# Patient Record
Sex: Female | Born: 1945
Health system: Southern US, Community
[De-identification: ages and names within clinical notes are randomized; demographics above are authoritative.]

## PROBLEM LIST (undated history)

## (undated) ENCOUNTER — Ambulatory Visit (HOSPITAL_COMMUNITY): Admission: EM | Payer: Medicare Other | Source: Home / Self Care

## (undated) DIAGNOSIS — H409 Unspecified glaucoma: Secondary | ICD-10-CM

## (undated) DIAGNOSIS — G709 Myoneural disorder, unspecified: Secondary | ICD-10-CM

## (undated) DIAGNOSIS — G4730 Sleep apnea, unspecified: Secondary | ICD-10-CM

## (undated) DIAGNOSIS — N3010 Interstitial cystitis (chronic) without hematuria: Secondary | ICD-10-CM

## (undated) DIAGNOSIS — K219 Gastro-esophageal reflux disease without esophagitis: Secondary | ICD-10-CM

## (undated) DIAGNOSIS — K635 Polyp of colon: Secondary | ICD-10-CM

## (undated) DIAGNOSIS — I10 Essential (primary) hypertension: Secondary | ICD-10-CM

## (undated) DIAGNOSIS — T7840XA Allergy, unspecified, initial encounter: Secondary | ICD-10-CM

## (undated) DIAGNOSIS — H269 Unspecified cataract: Secondary | ICD-10-CM

## (undated) DIAGNOSIS — N63 Unspecified lump in unspecified breast: Secondary | ICD-10-CM

## (undated) DIAGNOSIS — R011 Cardiac murmur, unspecified: Secondary | ICD-10-CM

## (undated) DIAGNOSIS — E785 Hyperlipidemia, unspecified: Secondary | ICD-10-CM

## (undated) DIAGNOSIS — J039 Acute tonsillitis, unspecified: Secondary | ICD-10-CM

## (undated) DIAGNOSIS — D219 Benign neoplasm of connective and other soft tissue, unspecified: Secondary | ICD-10-CM

## (undated) DIAGNOSIS — N200 Calculus of kidney: Secondary | ICD-10-CM

## (undated) HISTORY — DX: Acute tonsillitis, unspecified: J03.90

## (undated) HISTORY — DX: Essential (primary) hypertension: I10

## (undated) HISTORY — DX: Unspecified cataract: H26.9

## (undated) HISTORY — PX: ABDOMINAL HYSTERECTOMY: SHX81

## (undated) HISTORY — DX: Allergy, unspecified, initial encounter: T78.40XA

## (undated) HISTORY — PX: EYE SURGERY: SHX253

## (undated) HISTORY — DX: Polyp of colon: K63.5

## (undated) HISTORY — DX: Unspecified glaucoma: H40.9

## (undated) HISTORY — DX: Unspecified lump in unspecified breast: N63.0

## (undated) HISTORY — DX: Hyperlipidemia, unspecified: E78.5

## (undated) HISTORY — DX: Sleep apnea, unspecified: G47.30

## (undated) HISTORY — DX: Cardiac murmur, unspecified: R01.1

## (undated) HISTORY — PX: BREAST SURGERY: SHX581

## (undated) HISTORY — DX: Myoneural disorder, unspecified: G70.9

## (undated) HISTORY — PX: POLYPECTOMY: SHX149

## (undated) HISTORY — DX: Calculus of kidney: N20.0

## (undated) HISTORY — PX: BREAST EXCISIONAL BIOPSY: SUR124

## (undated) HISTORY — DX: Benign neoplasm of connective and other soft tissue, unspecified: D21.9

## (undated) HISTORY — DX: Gastro-esophageal reflux disease without esophagitis: K21.9

## (undated) HISTORY — PX: TUBAL LIGATION: SHX77

## (undated) HISTORY — DX: Interstitial cystitis (chronic) without hematuria: N30.10

---
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
## 1984-01-05 DIAGNOSIS — I10 Essential (primary) hypertension: Secondary | ICD-10-CM

## 1984-01-05 DIAGNOSIS — E785 Hyperlipidemia, unspecified: Secondary | ICD-10-CM

## 1984-01-05 HISTORY — DX: Essential (primary) hypertension: I10

## 1984-01-05 HISTORY — DX: Hyperlipidemia, unspecified: E78.5

## 1991-01-05 HISTORY — PX: MYOMECTOMY: SHX85

## 1991-01-05 HISTORY — PX: HYSTEROSCOPY: SHX211

## 1992-01-05 HISTORY — PX: LAPAROSCOPIC HYSTERECTOMY: SHX1926

## 1997-01-04 DIAGNOSIS — N3010 Interstitial cystitis (chronic) without hematuria: Secondary | ICD-10-CM

## 1997-01-04 HISTORY — DX: Interstitial cystitis (chronic) without hematuria: N30.10

## 1998-01-25 ENCOUNTER — Emergency Department (HOSPITAL_COMMUNITY): Admission: EM | Admit: 1998-01-25 | Discharge: 1998-01-25 | Payer: Self-pay | Admitting: Unknown Physician Specialty

## 2003-01-05 DIAGNOSIS — N200 Calculus of kidney: Secondary | ICD-10-CM

## 2003-01-05 HISTORY — DX: Calculus of kidney: N20.0

## 2006-01-04 HISTORY — PX: BREAST LUMPECTOMY: SHX2

## 2006-06-01 ENCOUNTER — Encounter: Admission: RE | Admit: 2006-06-01 | Discharge: 2006-06-01 | Payer: Self-pay | Admitting: Internal Medicine

## 2006-06-14 ENCOUNTER — Encounter: Admission: RE | Admit: 2006-06-14 | Discharge: 2006-06-14 | Payer: Self-pay | Admitting: Internal Medicine

## 2006-06-14 ENCOUNTER — Encounter (INDEPENDENT_AMBULATORY_CARE_PROVIDER_SITE_OTHER): Payer: Self-pay | Admitting: Diagnostic Radiology

## 2006-07-27 ENCOUNTER — Encounter (INDEPENDENT_AMBULATORY_CARE_PROVIDER_SITE_OTHER): Payer: Self-pay | Admitting: Surgery

## 2006-07-27 ENCOUNTER — Encounter: Admission: RE | Admit: 2006-07-27 | Discharge: 2006-07-27 | Payer: Self-pay | Admitting: Surgery

## 2006-07-27 ENCOUNTER — Ambulatory Visit (HOSPITAL_BASED_OUTPATIENT_CLINIC_OR_DEPARTMENT_OTHER): Admission: RE | Admit: 2006-07-27 | Discharge: 2006-07-27 | Payer: Self-pay | Admitting: Surgery

## 2006-07-27 HISTORY — PX: BREAST EXCISIONAL BIOPSY: SUR124

## 2007-06-06 ENCOUNTER — Encounter: Admission: RE | Admit: 2007-06-06 | Discharge: 2007-06-06 | Payer: Self-pay | Admitting: Internal Medicine

## 2007-11-15 ENCOUNTER — Encounter: Admission: RE | Admit: 2007-11-15 | Discharge: 2007-11-15 | Payer: Self-pay | Admitting: Otolaryngology

## 2008-01-05 HISTORY — PX: COLONOSCOPY: SHX174

## 2008-06-06 ENCOUNTER — Encounter: Admission: RE | Admit: 2008-06-06 | Discharge: 2008-06-06 | Payer: Self-pay | Admitting: Internal Medicine

## 2009-06-10 ENCOUNTER — Encounter: Admission: RE | Admit: 2009-06-10 | Discharge: 2009-06-10 | Payer: Self-pay | Admitting: Internal Medicine

## 2010-04-07 ENCOUNTER — Other Ambulatory Visit: Payer: Self-pay | Admitting: Otolaryngology

## 2010-04-13 ENCOUNTER — Ambulatory Visit
Admission: RE | Admit: 2010-04-13 | Discharge: 2010-04-13 | Disposition: A | Discharge status: Home / Self Care | Payer: Medicare Other | Source: Ambulatory Visit | Attending: Otolaryngology | Admitting: Otolaryngology

## 2010-05-12 ENCOUNTER — Other Ambulatory Visit: Payer: Self-pay | Admitting: Internal Medicine

## 2010-05-12 DIAGNOSIS — Z1231 Encounter for screening mammogram for malignant neoplasm of breast: Secondary | ICD-10-CM

## 2010-05-15 ENCOUNTER — Ambulatory Visit: Payer: Medicare Other | Admitting: Gastroenterology

## 2010-05-19 NOTE — Op Note (Signed)
NAMEKELSY, POLACK              ACCOUNT NO.:  [id number]   MEDICAL RECORD NO.:  [id number]          PATIENT TYPE:  AMB   LOCATION:  DSC                          FACILITY:  [facility]   PHYSICIAN:  Wilmon Arms. Corliss Skains, M.D. DATE OF BIRTH:  1945/02/27   DATE OF PROCEDURE:  07/27/2006  DATE OF DISCHARGE:                               OPERATIVE REPORT   PREOPERATIVE DIAGNOSIS:  Sclerosing intraductal papilloma, right breast.   POSTOPERATIVE DIAGNOSIS:  Sclerosing intraductal papilloma, right  breast.   PROCEDURE PERFORMED:  Needle localized right partial mastectomy.   SURGEON:  Wilmon Arms. Tsuei, M.D.   ANESTHESIA:  General via LMA.   INDICATIONS:  The patient is a healthy 65 year old female who underwent  a recent routine screening mammogram.  She is noted to have some loosely  clustered calcifications in the upper outer quadrant right breast.  She  underwent a stereotactic biopsy on 06/14/2006 which returned a diagnosis  of sclerosing intraductal papilloma associated with calcifications.  She  is now referred for excision of the entire lesion.  The area was  nonpalpable so a wire was placed this morning by radiology.   DESCRIPTION OF PROCEDURE:  The patient brought to the operating room,  placed in supine position on operating room table.  After an adequate  level of general anesthesia was obtained, the patient's right breast was  prepped with Betadine and draped in sterile fashion.  A curvilinear  incision was made around the needle which was placed in the right upper  outer quadrant of breast.  Skin flaps were raised in all directions with  cautery.  I followed the wire down removing the core of tissue  approximately 3 cm across.  We continued this down until we were almost  at the chest wall fascia.  We were beyond the tip of the needle.  The  specimen was removed and sent and oriented with a long suture lateral  and short suture superior.  Specimen mammogram was obtained and the  radiologist was able to see the previously placed biopsy clip as well as  the entire suspicious lesion.  The specimen was sent for pathology.  The  wound was irrigated.  Hemostasis obtained with cautery.  The wound was  closed with a deep layer of 3-0 Vicryl and subcuticular layer of 4-0  Monocryl.  Steri-Strips and clean dressings were applied.  The patient  was extubated, brought to recovery in stable condition.  All sponge,  instrument, needle counts correct.      Wilmon Arms. Tsuei, M.D.  Electronically Signed     MKT/MEDQ  D:  07/27/2006  T:  07/27/2006  Job:  [id number]   cc:   Candyce Churn. Allyne Gee, M.D.

## 2010-05-29 ENCOUNTER — Ambulatory Visit (INDEPENDENT_AMBULATORY_CARE_PROVIDER_SITE_OTHER): Payer: Medicare Other | Admitting: Gastroenterology

## 2010-05-29 ENCOUNTER — Encounter: Payer: Self-pay | Admitting: Gastroenterology

## 2010-05-29 VITALS — BP 124/74 | HR 84 | Ht 63.5 in | Wt 155.0 lb

## 2010-05-29 DIAGNOSIS — R196 Halitosis: Secondary | ICD-10-CM

## 2010-05-29 DIAGNOSIS — R933 Abnormal findings on diagnostic imaging of other parts of digestive tract: Secondary | ICD-10-CM

## 2010-05-29 DIAGNOSIS — L940 Localized scleroderma [morphea]: Secondary | ICD-10-CM

## 2010-05-29 DIAGNOSIS — R6889 Other general symptoms and signs: Secondary | ICD-10-CM

## 2010-05-29 MED ORDER — ESOMEPRAZOLE MAGNESIUM 40 MG PO CPDR: 40.0000 mg | DELAYED_RELEASE_CAPSULE | Freq: Every day | ORAL | Status: DC

## 2010-05-29 NOTE — Patient Instructions (Signed)
Nexium prescription written, take one pill once daily shortly before BF. You will be set up for an upper endoscopy. We will refer to a rheumatologist to consider further workup for Scleroderma. A copy of this information will be made available to Drs. Isabel Caprice. We will get colonoscopy records from Dr. Loreta Ave sent here for review.

## 2010-05-29 NOTE — Progress Notes (Signed)
     HPI: This is a  very pleasant 65 year old woman.  She has halitosis, chronic for many many years.  She has no pyrosis.  She has tried PPI in the past for 1-2 weeks at a time.  No dysphagia. She does cough with swallowing at times.   Overall her weight is stable in the past year.    Most recently prevacid, before breakfast.  Sees Dr. Loreta Ave, colonoscopy 2010, polyps, told to return in 5 years.  She saw ENT physician Dr.  Jearld Fenton recently:  Underwent barium esophgram last month: signficant reflux and "decreased primary peristatlic wave in esophagus" and radiologist suggested this may be related to scleroderma.    Review of systems: Pertinent positive and negative review of systems were noted in the above HPI section.  All other review of systems was otherwise negative.   Past Medical History, Past Surgical History, Family History, Social History, Current Medications, Allergies were all reviewed with the patient via [facility] electronic medical record system.   Physical Exam: BP 124/74  Pulse 84  Ht 5' 3.5" (1.613 m)  Wt 155 lb (70.308 kg)  BMI 27.03 kg/m2 Constitutional: generally well-appearing Psychiatric: alert and oriented x3 Eyes: extraocular movements intact Mouth: oral pharynx moist, no lesions Neck: supple no lymphadenopathy Cardiovascular: heart regular rate and rhythm Lungs: clear to auscultation bilaterally Abdomen: soft, nontender, nondistended, no obvious ascites, no peritoneal signs, normal bowel sounds Extremities: no lower extremity edema bilaterally Skin: no lesions on visible extremities I had her breathe on me and her breath smelled pretty normal.. she told me this is always the case with health care providers however she and her friends have noticed very bad breath.    Assessment and plan: 65 y.o. female with Chronic halitosis, abnormal barium esophagram  The barium esophagram suggested she might have scleroderma. She does have chronically itchy  skin.  Perhaps her significant reflux noted on esophagram is related to underlying scleroderma. I am going to refer her to a rheumatologist to help with that workup further. Her breath did not smell bad on exam today. She swears it does normally.  Perhaps this is indeed reflux related since significant reflux was occurring during her barium esophagram. She is going to start proton pump inhibitor once daily for the next 2-3 months. We will also proceed with an EGD to check for signs of significant acid damage.

## 2010-06-02 ENCOUNTER — Ambulatory Visit (AMBULATORY_SURGERY_CENTER): Payer: Medicare Other | Admitting: Gastroenterology

## 2010-06-02 ENCOUNTER — Encounter: Payer: Self-pay | Admitting: Gastroenterology

## 2010-06-02 VITALS — BP 161/75 | HR 56 | Temp 97.7°F | Resp 22 | Ht 63.0 in | Wt 149.0 lb

## 2010-06-02 DIAGNOSIS — R6889 Other general symptoms and signs: Secondary | ICD-10-CM

## 2010-06-02 DIAGNOSIS — K219 Gastro-esophageal reflux disease without esophagitis: Secondary | ICD-10-CM

## 2010-06-02 DIAGNOSIS — K449 Diaphragmatic hernia without obstruction or gangrene: Secondary | ICD-10-CM

## 2010-06-02 DIAGNOSIS — IMO0001 Reserved for inherently not codable concepts without codable children: Secondary | ICD-10-CM

## 2010-06-02 MED ORDER — SODIUM CHLORIDE 0.9 % IV SOLN: 500.0000 mL | INTRAVENOUS | Status: DC

## 2010-06-03 ENCOUNTER — Telehealth: Payer: Self-pay

## 2010-06-03 NOTE — Telephone Encounter (Signed)

## 2010-06-05 ENCOUNTER — Telehealth: Payer: Self-pay | Admitting: Gastroenterology

## 2010-06-05 NOTE — Telephone Encounter (Signed)
Colonoscopy 05/2008 with Dr. Loreta Ave done for "personal history of polyps, FH of colon cancer (mother & father)" Two "small cecal polyps" that were not adenomas by pathology; otherwise normal examination. She was recommended to have repeat colonoscopy at 3 year interval.  Patty, can you put her in for repeat colonoscopy 05/2013 (recommended interval based on guidelines)

## 2010-06-05 NOTE — Telephone Encounter (Signed)
Recall in Epic  

## 2010-06-15 ENCOUNTER — Ambulatory Visit: Payer: Medicare Other

## 2010-06-19 ENCOUNTER — Ambulatory Visit
Admission: RE | Admit: 2010-06-19 | Discharge: 2010-06-19 | Disposition: A | Discharge status: Home / Self Care | Payer: Medicare Other | Source: Ambulatory Visit | Attending: Internal Medicine | Admitting: Internal Medicine

## 2010-06-19 DIAGNOSIS — Z1231 Encounter for screening mammogram for malignant neoplasm of breast: Secondary | ICD-10-CM

## 2010-07-01 ENCOUNTER — Other Ambulatory Visit: Payer: Self-pay

## 2010-07-01 MED ORDER — ESOMEPRAZOLE MAGNESIUM 40 MG PO CPDR: 40.0000 mg | DELAYED_RELEASE_CAPSULE | Freq: Every day | ORAL | Status: DC

## 2010-08-31 ENCOUNTER — Telehealth: Payer: Self-pay | Admitting: Gastroenterology

## 2010-08-31 NOTE — Telephone Encounter (Signed)
Pt was put on Nexium to help with Halitosis.  She is calling to report no improvement in symptoms.  Please advise

## 2010-09-01 NOTE — Telephone Encounter (Signed)
Pt aware she has seen Dr Corliss Skains I will try to get those records.  Nothing was found on that exam.  She is calling her PCP

## 2010-09-01 NOTE — Telephone Encounter (Signed)
Ok, thanks.

## 2010-09-01 NOTE — Telephone Encounter (Signed)
Left message on machine to call back  

## 2010-09-01 NOTE — Telephone Encounter (Signed)
She should stop the nexium.  I don't think her halitosis is related to her UGI tract, acid.  Has she seen the rheumatologist that i referred her to yet?

## 2010-10-19 LAB — BASIC METABOLIC PANEL
BUN: 15
CO2: 28
Calcium: 9.2
Chloride: 106
Creatinine, Ser: 0.92
GFR calc Af Amer: >60
GFR calc non Af Amer: >60
Glucose, Bld: 110 — ABNORMAL HIGH
Potassium: 3.9
Sodium: 138

## 2010-10-19 LAB — POCT HEMOGLOBIN-HEMACUE
Hemoglobin: 13.6
Operator id: 154361

## 2011-03-02 DIAGNOSIS — H1044 Vernal conjunctivitis: Secondary | ICD-10-CM | POA: Diagnosis not present

## 2011-05-12 DIAGNOSIS — N182 Chronic kidney disease, stage 2 (mild): Secondary | ICD-10-CM | POA: Diagnosis not present

## 2011-05-12 DIAGNOSIS — E7800 Pure hypercholesterolemia, unspecified: Secondary | ICD-10-CM | POA: Diagnosis not present

## 2011-05-12 DIAGNOSIS — I129 Hypertensive chronic kidney disease with stage 1 through stage 4 chronic kidney disease, or unspecified chronic kidney disease: Secondary | ICD-10-CM | POA: Diagnosis not present

## 2011-05-12 DIAGNOSIS — Z79899 Other long term (current) drug therapy: Secondary | ICD-10-CM | POA: Diagnosis not present

## 2011-05-12 DIAGNOSIS — E559 Vitamin D deficiency, unspecified: Secondary | ICD-10-CM | POA: Diagnosis not present

## 2011-05-17 ENCOUNTER — Other Ambulatory Visit: Payer: Self-pay | Admitting: Internal Medicine

## 2011-05-17 DIAGNOSIS — Z1231 Encounter for screening mammogram for malignant neoplasm of breast: Secondary | ICD-10-CM

## 2011-06-28 ENCOUNTER — Ambulatory Visit
Admission: RE | Admit: 2011-06-28 | Discharge: 2011-06-28 | Disposition: A | Discharge status: Home / Self Care | Payer: Medicare Other | Source: Ambulatory Visit | Attending: Internal Medicine | Admitting: Internal Medicine

## 2011-06-28 DIAGNOSIS — Z1231 Encounter for screening mammogram for malignant neoplasm of breast: Secondary | ICD-10-CM | POA: Diagnosis not present

## 2011-09-07 ENCOUNTER — Observation Stay (HOSPITAL_COMMUNITY)
Admission: EM | Admit: 2011-09-07 | Discharge: 2011-09-08 | Disposition: A | Discharge status: Home / Self Care | Payer: Medicare Other | Attending: Emergency Medicine | Admitting: Emergency Medicine

## 2011-09-07 ENCOUNTER — Emergency Department (HOSPITAL_COMMUNITY): Payer: Medicare Other

## 2011-09-07 ENCOUNTER — Encounter (HOSPITAL_COMMUNITY): Payer: Self-pay | Admitting: Radiology

## 2011-09-07 ENCOUNTER — Emergency Department (INDEPENDENT_AMBULATORY_CARE_PROVIDER_SITE_OTHER)
Admission: EM | Admit: 2011-09-07 | Discharge: 2011-09-07 | Disposition: A | Discharge status: Home / Self Care | Payer: Medicare Other | Source: Home / Self Care | Attending: Emergency Medicine | Admitting: Emergency Medicine

## 2011-09-07 ENCOUNTER — Encounter (HOSPITAL_COMMUNITY): Payer: Self-pay | Admitting: Emergency Medicine

## 2011-09-07 DIAGNOSIS — M810 Age-related osteoporosis without current pathological fracture: Secondary | ICD-10-CM | POA: Diagnosis not present

## 2011-09-07 DIAGNOSIS — R079 Chest pain, unspecified: Secondary | ICD-10-CM | POA: Diagnosis not present

## 2011-09-07 DIAGNOSIS — K219 Gastro-esophageal reflux disease without esophagitis: Secondary | ICD-10-CM | POA: Insufficient documentation

## 2011-09-07 DIAGNOSIS — I10 Essential (primary) hypertension: Secondary | ICD-10-CM | POA: Insufficient documentation

## 2011-09-07 LAB — CBC WITH DIFFERENTIAL/PLATELET
Basophils Absolute: 0 10*3/uL (ref 0.0–0.1)
Basophils Relative: 0 % (ref 0–1)
Eosinophils Absolute: 0.1 10*3/uL (ref 0.0–0.7)
Eosinophils Relative: 1 % (ref 0–5)
HCT: 40.4 % (ref 36.0–46.0)
Hemoglobin: 13.7 g/dL (ref 12.0–15.0)
Lymphocytes Relative: 26 % (ref 12–46)
Lymphs Abs: 1.6 10*3/uL (ref 0.7–4.0)
MCH: 31.4 pg (ref 26.0–34.0)
MCHC: 33.9 g/dL (ref 30.0–36.0)
MCV: 92.4 fL (ref 78.0–100.0)
Monocytes Absolute: 0.4 10*3/uL (ref 0.1–1.0)
Monocytes Relative: 6 % (ref 3–12)
Neutro Abs: 4.2 10*3/uL (ref 1.7–7.7)
Neutrophils Relative %: 67 % (ref 43–77)
Platelets: 266 10*3/uL (ref 150–400)
RBC: 4.37 MIL/uL (ref 3.87–5.11)
RDW: 13.7 % (ref 11.5–15.5)
WBC: 6.2 10*3/uL (ref 4.0–10.5)

## 2011-09-07 LAB — COMPREHENSIVE METABOLIC PANEL
ALT: 17 U/L (ref 0–35)
AST: 24 U/L (ref 0–37)
Albumin: 3.8 g/dL (ref 3.5–5.2)
Alkaline Phosphatase: 70 U/L (ref 39–117)
BUN: 15 mg/dL (ref 6–23)
CO2: 25 mEq/L (ref 19–32)
Calcium: 9.8 mg/dL (ref 8.4–10.5)
Chloride: 108 mEq/L (ref 96–112)
Creatinine, Ser: 0.67 mg/dL (ref 0.50–1.10)
GFR calc Af Amer: >90 mL/min (ref >90)
GFR calc non Af Amer: 89 mL/min — ABNORMAL LOW (ref >90)
Glucose, Bld: 101 mg/dL — ABNORMAL HIGH (ref 70–99)
Potassium: 3.8 mEq/L (ref 3.5–5.1)
Sodium: 143 mEq/L (ref 135–145)
Total Bilirubin: 0.8 mg/dL (ref 0.3–1.2)
Total Protein: 7.2 g/dL (ref 6.0–8.3)

## 2011-09-07 LAB — POCT I-STAT TROPONIN I
Troponin i, poc: 0 ng/mL (ref 0.00–0.08)
Troponin i, poc: 0 ng/mL (ref 0.00–0.08)
Troponin i, poc: 0 ng/mL (ref 0.00–0.08)

## 2011-09-07 LAB — TROPONIN I: Troponin I: <0.3 ng/mL (ref <0.30)

## 2011-09-07 MED ORDER — IBUPROFEN 800 MG PO TABS
800.0000 mg | ORAL_TABLET | Freq: Once | ORAL | Status: AC
  Administered 2011-09-07: 800 mg via ORAL
  Filled 2011-09-07: qty 1

## 2011-09-07 MED ORDER — SODIUM CHLORIDE 0.9 % IJ SOLN
INTRAMUSCULAR | Status: AC
  Filled 2011-09-07: qty 3

## 2011-09-07 MED ORDER — PANTOPRAZOLE SODIUM 40 MG PO TBEC
40.0000 mg | DELAYED_RELEASE_TABLET | Freq: Every day | ORAL | Status: DC
  Administered 2011-09-07: 40 mg via ORAL
  Filled 2011-09-07: qty 1

## 2011-09-07 NOTE — ED Provider Notes (Signed)
Medical screening examination/treatment/procedure(s) were conducted as a shared visit with non-physician practitioner(s) and myself.  I personally evaluated the patient during the encounter  Atypical left-sided chest pain patient having for the past 2 hours. Movie treadmill yesterday but did not have pain until today. No nausea, vomiting, chills, shortness of breath. No cardiac history. History hypertension hyperlipidemia. Nonsmoker TIMI 1. Low suspicion for ACS. EKG shows new T-wave inversion lead 3, stable T wave inversions inferiorly  Glynn Octave, MD 09/07/11 1722

## 2011-09-07 NOTE — ED Notes (Signed)
Report given to Lana, RN.

## 2011-09-07 NOTE — Progress Notes (Signed)
Observation review is complete. 

## 2011-09-07 NOTE — ED Notes (Signed)
[facility] ac site infiltrated. New IV placed by [facility]

## 2011-09-07 NOTE — ED Provider Notes (Signed)
History     CSN: [id number]  Arrival date & time 09/07/11  1133   First MD Initiated Contact with Patient 09/07/11 1147      Chief Complaint  Patient presents with  . Chest Pain    (Consider location/radiation/quality/duration/timing/severity/associated sxs/prior treatment) HPI Comments: 66 y/o AAF with L sided nonradiating CP described as "pressure" and "tightness" starting at 1100 today. No N/V/ diaphoresis, abd pain, palpitations, presyncope, syncope. No apparent exertional or positional component. Is not related to eating. Denies waterbrash. Was having sharp L midaxillary and anterior CP yesterday that lasted 30 min yesterday after trying to move a treadmill, but this has since resolved. Patient has a history significant for hypertension, hyperlipidemia, severe reflux. Discontinued Nexium several days ago.   ROS as noted in HPI. All other ROS negative.   Patient is a 66 y.o. female presenting with chest pain. The history is provided by the patient. No language interpreter was used.  Chest Pain The chest pain began 1 - 2 hours ago. Chest pain occurs constantly. The chest pain is improving. The quality of the pain is described as pressure-like and squeezing. The pain does not radiate. Pertinent negatives for primary symptoms include no fever, no fatigue, no syncope, no shortness of breath, no cough, no wheezing, no palpitations, no abdominal pain, no nausea, no vomiting and no dizziness.  Pertinent negatives for associated symptoms include no near-syncope. She tried nothing for the symptoms.  Her past medical history is significant for hyperlipidemia and hypertension.  Pertinent negatives for past medical history include no diabetes, no DVT and no MI.  Her family medical history is significant for early MI in family. Family history comments: mother with MI at age 13     Past Medical History  Diagnosis Date  . Colon polyps   . Hyperlipidemia 1986  . Hypertension 1986  .  Interstitial cystitis 1999  . Kidney stones 2005  . GERD (gastroesophageal reflux disease)   . Osteoporosis     Past Surgical History  Procedure Date  . Hysteroscopy   . Tonsillectomy   . Colonoscopy   . Polypectomy   . Laparoscopic hysterectomy     Family History  Problem Relation Age of Onset  . Colon cancer Father     mother, PGM  . Stroke Father   . Hypertension Father   . Hypertension Mother   . Heart disease Mother     History  Substance Use Topics  . Smoking status: Never Smoker   . Smokeless tobacco: Never Used  . Alcohol Use: 3.5 oz/week    7 drink(s) per week    OB History    Grav Para Term Preterm Abortions TAB SAB Ect Mult Living                  Review of Systems  Constitutional: Negative for fever and fatigue.  Respiratory: Negative for cough, shortness of breath and wheezing.   Cardiovascular: Positive for chest pain. Negative for palpitations, syncope and near-syncope.  Gastrointestinal: Negative for nausea, vomiting and abdominal pain.  Neurological: Negative for dizziness.    Allergies  Hctz  Home Medications   Current Outpatient Rx  Name Route Sig Dispense Refill  . ATORVASTATIN CALCIUM 10 MG PO TABS Oral Take 10 mg by mouth daily.      . ASPIRIN 81 MG PO TABS Oral Take 81 mg by mouth daily.      Marland Kitchen CETIRIZINE HCL 10 MG PO CAPS Oral Take 1 capsule by mouth daily.      Marland Kitchen  COLESEVELAM HCL 3.75 G PO PACK Oral Take 3.75 g by mouth daily.      Marland Kitchen ESOMEPRAZOLE MAGNESIUM 40 MG PO CPDR Oral Take 1 capsule (40 mg total) by mouth daily. 90 capsule 4  . FINASTERIDE 5 MG PO TABS Oral Take 2.5 mg by mouth daily.      Marland Kitchen LISINOPRIL 5 MG PO TABS Oral Take 5 mg by mouth daily.      Marland Kitchen RISEDRONATE SODIUM 150 MG PO TABS Oral Take 150 mg by mouth every 30 (thirty) days. with water on empty stomach, nothing by mouth or lie down for next 30 minutes.       There were no vitals taken for this visit.  Physical Exam  Nursing note and vitals  reviewed. Constitutional: She is oriented to person, place, and time. She appears well-developed and well-nourished.  HENT:  Head: Normocephalic and atraumatic.  Eyes: Conjunctivae and EOM are normal.  Neck: Normal range of motion.  Cardiovascular: Normal rate, regular rhythm, normal heart sounds and intact distal pulses.   No murmur heard. Pulmonary/Chest: Effort normal and breath sounds normal.  Abdominal: Soft. Bowel sounds are normal. She exhibits no distension. There is no tenderness. There is no rebound and no guarding.  Musculoskeletal: Normal range of motion. She exhibits no edema and no tenderness.  Neurological: She is alert and oriented to person, place, and time.  Skin: Skin is warm and dry.  Psychiatric: She has a normal mood and affect. Her behavior is normal. Judgment and thought content normal.    ED Course  Procedures (including critical care time)  Labs Reviewed - No data to display No results found.   1. Chest pain     MDM  Previous records reviewed. H/o abnormal barium swallow showing significant reflux -EGD showed small hiatal hernia, otherwise normal. Started on Nexium in June for halitosis, which was discontinued on 8/28.  Pt may have scleroderma, saw Dr. Loni Beckwith for this. W/u neg.   EKG: NST rate 64 nml interval, normal axis no hypertrophy ,TWI in III, nonspecific T was changes anteriorlaterally which is new from prev ekg dated 8/26. No other STTW.   Current sx seem to be different than yesterday's pain. Pt reports continued sx although states are currently less. Has isolated TWI in III which is new from previous EKG dated 8/26. No STEMI. concern for atypical CP. Transferring to ED for cardiac w/u    Luiz Blare, MD 09/07/11 1248

## 2011-09-07 NOTE — ED Provider Notes (Signed)
Patient in CDU under chest pain protocol.  Patient reports initially having left posterior chest wall pain last evening, but awoke this morning with substernal chest pressure.  Patient resting comfortably at present.  Lungs CTA bilaterally.  S1/S2, RRR.  Abdomen soft, bowel sounds present.  Strong distal pulses palpated all extremities.  Monitor reveals NSR without ectopy.  12 lead reviewed, no ischemic changes noted.  Cardiac markers wnl.  Patient schedule for CCTA in AM.  Treatment and diagnostic plan discussed with patient.  Jimmye Norman, NP 09/07/11 [phone]

## 2011-09-07 NOTE — ED Notes (Addendum)
Pt presents with chest pain X 2 hours. Pt describes it as a pressure that radiates under her left breast. Pt states that she had pain yesterday that started in her back and radiated around to front after moving a treadmill yesterday. Pt states that today's pain is different than yesterday. Pt was given nitro X1 and reports some relief from pain. Pt took 2 baby ASA this morning

## 2011-09-07 NOTE — ED Notes (Signed)
Report to [facility].  

## 2011-09-07 NOTE — ED Notes (Signed)
Pt started with chest pain yesterday after moving treadmill. Pain is located center chest and radiates to back. Denies SOB, N/V or diaphoresis.

## 2011-09-07 NOTE — ED Notes (Signed)
Report from Lori, RN

## 2011-09-07 NOTE — ED Provider Notes (Signed)
History     CSN: [id number]  Arrival date & time 09/07/11  1318   First MD Initiated Contact with Patient 09/07/11 1339      Chief Complaint  Patient presents with  . Chest Pain    (Consider location/radiation/quality/duration/timing/severity/associated sxs/prior treatment) HPI Comments: Marissa Orozco 66 y.o. female   The chief complaint is: Patient presents with:   Chest Pain   The patient has medical history significant for:   Past Medical History:   Colon polyps                                                 Hyperlipidemia                                  1986         Hypertension                                    1986         Interstitial cystitis                           1999         Kidney stones                                   2005         GERD (gastroesophageal reflux disease)                       Osteoporosis                                                Patient presents with left sided CP that began this morning. Patient rates the pain 8/10 at its worse and 5/10 during HPI. Patient states that she had no associated symptoms, radiation, or transmission. Patient believes this is a pulled muscle from moving a treadmill the day before.Patient states that she has never had a stress test. Family history significant for mother who had a "mild" MI at age 49. Denies fever, chills, diaphoresis. Denies NVD, or abdominal pain. Denies SOB, palpitations, edema, or DOE. Denies recent long travel, smoking, or use of hormone replacement therapy.      The history is provided by the patient.    Past Medical History  Diagnosis Date  . Colon polyps   . Hyperlipidemia 1986  . Hypertension 1986  . Interstitial cystitis 1999  . Kidney stones 2005  . GERD (gastroesophageal reflux disease)   . Osteoporosis     Past Surgical History  Procedure Date  . Hysteroscopy   . Tonsillectomy   . Colonoscopy   . Polypectomy   . Laparoscopic hysterectomy     Family History    Problem Relation Age of Onset  . Colon cancer Father     mother, PGM  . Stroke Father   . Hypertension Father   . Hypertension Mother   . Heart disease  Mother     History  Substance Use Topics  . Smoking status: Never Smoker   . Smokeless tobacco: Never Used  . Alcohol Use: 3.5 oz/week    7 drink(s) per week    OB History    Grav Para Term Preterm Abortions TAB SAB Ect Mult Living                  Review of Systems  Constitutional: Negative for fever, chills and diaphoresis.  Respiratory: Negative for shortness of breath.   Cardiovascular: Positive for chest pain. Negative for palpitations and leg swelling.  Gastrointestinal: Negative for nausea, vomiting, abdominal pain and diarrhea.  All other systems reviewed and are negative.    Allergies  Hctz  Home Medications   Current Outpatient Rx  Name Route Sig Dispense Refill  . CALCIUM PO Oral Take 1 tablet by mouth daily.    Marland Kitchen VITAMIN D PO Oral Take 1 tablet by mouth daily.    Marland Kitchen FINASTERIDE 5 MG PO TABS Oral Take 2.5 mg by mouth daily.      . OMEGA-3 FATTY ACIDS 1000 MG PO CAPS Oral Take 2 g by mouth daily.    Marland Kitchen LISINOPRIL 5 MG PO TABS Oral Take 5 mg by mouth daily.      Marland Kitchen MAGNESIUM PO Oral Take 1 tablet by mouth daily.    . ADULT MULTIVITAMIN W/MINERALS CH Oral Take 1 tablet by mouth daily.    Marland Kitchen RISEDRONATE SODIUM 150 MG PO TABS Oral Take 150 mg by mouth every 30 (thirty) days. with water on empty stomach, nothing by mouth or lie down for next 30 minutes.     Marland Kitchen ESOMEPRAZOLE MAGNESIUM 40 MG PO CPDR Oral Take 1 capsule (40 mg total) by mouth daily. 90 capsule 4    BP 149/74  Pulse 64  Temp 98.3 F (36.8 C) (Oral)  Resp 17  SpO2 100%  Physical Exam  Nursing note and vitals reviewed. Constitutional: She appears well-developed and well-nourished. No distress.  HENT:  Head: Normocephalic and atraumatic.  Mouth/Throat: Oropharynx is clear and moist.  Eyes: Conjunctivae and EOM are normal. No scleral icterus.   Neck: Normal range of motion. Neck supple.  Cardiovascular: Normal rate, regular rhythm, normal heart sounds and intact distal pulses.        No carotid bruits on exam. No pedal edema appreciated.  CP is not reproducible on exam  Pulmonary/Chest: Effort normal and breath sounds normal.  Abdominal: Soft. Bowel sounds are normal. There is no tenderness.  Neurological: She is alert.  Skin: Skin is warm and dry.    ED Course  Procedures (including critical care time) Results for orders placed during the hospital encounter of 09/07/11  CBC WITH DIFFERENTIAL      Component Value Range   WBC 6.2  4.0 - 10.5 K/uL   RBC 4.37  3.87 - 5.11 MIL/uL   Hemoglobin 13.7  12.0 - 15.0 g/dL   HCT 40.9  81.1 - 91.4 %   MCV 92.4  78.0 - 100.0 fL   MCH 31.4  26.0 - 34.0 pg   MCHC 33.9  30.0 - 36.0 g/dL   RDW 78.2  95.6 - 21.3 %   Platelets 266  150 - 400 K/uL   Neutrophils Relative 67  43 - 77 %   Neutro Abs 4.2  1.7 - 7.7 K/uL   Lymphocytes Relative 26  12 - 46 %   Lymphs Abs 1.6  0.7 - 4.0 K/uL  Monocytes Relative 6  3 - 12 %   Monocytes Absolute 0.4  0.1 - 1.0 K/uL   Eosinophils Relative 1  0 - 5 %   Eosinophils Absolute 0.1  0.0 - 0.7 K/uL   Basophils Relative 0  0 - 1 %   Basophils Absolute 0.0  0.0 - 0.1 K/uL  COMPREHENSIVE METABOLIC PANEL      Component Value Range   Sodium 143  135 - 145 mEq/L   Potassium 3.8  3.5 - 5.1 mEq/L   Chloride 108  96 - 112 mEq/L   CO2 25  19 - 32 mEq/L   Glucose, Bld 101 (*) 70 - 99 mg/dL   BUN 15  6 - 23 mg/dL   Creatinine, Ser 1.61  0.50 - 1.10 mg/dL   Calcium 9.8  8.4 - 09.6 mg/dL   Total Protein 7.2  6.0 - 8.3 g/dL   Albumin 3.8  3.5 - 5.2 g/dL   AST 24  0 - 37 U/L   ALT 17  0 - 35 U/L   Alkaline Phosphatase 70  39 - 117 U/L   Total Bilirubin 0.8  0.3 - 1.2 mg/dL   GFR calc non Af Amer 89 (*) >90 mL/min   GFR calc Af Amer >90  >90 mL/min  TROPONIN I      Component Value Range   Troponin I <0.30  <0.30 ng/mL    Labs Reviewed - No data to  display Dg Chest 2 View  09/07/2011  *RADIOLOGY REPORT*  Clinical Data: Chest pain.  CHEST - 2 VIEW  Comparison: No priors.  Findings: Lung volumes are normal.  No consolidative airspace disease.  No pleural effusions.  No pneumothorax.  No pulmonary nodule or mass noted.  Pulmonary vasculature and the cardiomediastinal silhouette are within normal limits. Atherosclerotic calcifications within the thoracic aorta.  IMPRESSION: 1. No radiographic evidence of acute cardiopulmonary disease. 2.  Atherosclerosis.   Original Report Authenticated By: Florencia Reasons, M.D.     Date: 09/07/2011  Rate: 66  Rhythm: normal sinus rhythm  QRS Axis: normal  Intervals: normal  ST/T Wave abnormalities: non-specific t wave abnormalities  Conduction Disutrbances:none  Narrative Interpretation: No STEMI  Old EKG Reviewed: changes noted New T wave inversion in T3    1. Chest pain       MDM  Patient presented with CP that began this am. CP improved with nitro given on EMS with improvement. CMP, troponin, CBC: unremarkable. CXR: remarkable for atheroaclerotic disease. Otherwise unremarkable. Timi score: 1 Patient will be moved to CDU on CP protocol. No red flags for PE, PNA, or pneumothorax.        Pixie Casino, PA-C 09/07/11 1536  Pixie Casino, PA-C 09/07/11 1544

## 2011-09-08 ENCOUNTER — Observation Stay (HOSPITAL_COMMUNITY): Payer: Medicare Other

## 2011-09-08 DIAGNOSIS — I2510 Atherosclerotic heart disease of native coronary artery without angina pectoris: Secondary | ICD-10-CM | POA: Diagnosis not present

## 2011-09-08 DIAGNOSIS — R079 Chest pain, unspecified: Secondary | ICD-10-CM | POA: Diagnosis not present

## 2011-09-08 MED ORDER — MORPHINE SULFATE 4 MG/ML IJ SOLN
2.0000 mg | Freq: Once | INTRAMUSCULAR | Status: AC
  Administered 2011-09-08: 2 mg via INTRAVENOUS
  Filled 2011-09-08: qty 1

## 2011-09-08 MED ORDER — MORPHINE SULFATE 2 MG/ML IJ SOLN
INTRAMUSCULAR | Status: AC
  Administered 2011-09-08: 2 mg via INTRAVENOUS
  Filled 2011-09-08: qty 1

## 2011-09-08 MED ORDER — NITROGLYCERIN 0.4 MG SL SUBL
SUBLINGUAL_TABLET | SUBLINGUAL | Status: AC
  Administered 2011-09-08: 09:00:00
  Filled 2011-09-08: qty 25

## 2011-09-08 MED ORDER — METOPROLOL TARTRATE 25 MG PO TABS
100.0000 mg | ORAL_TABLET | Freq: Once | ORAL | Status: AC
  Administered 2011-09-08: 50 mg via ORAL
  Filled 2011-09-08: qty 4

## 2011-09-08 MED ORDER — IOHEXOL 350 MG/ML SOLN
80.0000 mL | Freq: Once | INTRAVENOUS | Status: AC | PRN
  Administered 2011-09-08: 80 mL via INTRAVENOUS

## 2011-09-08 MED ORDER — METOPROLOL TARTRATE 1 MG/ML IV SOLN
INTRAVENOUS | Status: AC
  Filled 2011-09-08: qty 15

## 2011-09-08 MED ORDER — CYCLOBENZAPRINE HCL 10 MG PO TABS: 10.0000 mg | ORAL_TABLET | Freq: Two times a day (BID) | ORAL | Status: AC | PRN

## 2011-09-08 NOTE — ED Notes (Signed)
Patient transported to CT 

## 2011-09-08 NOTE — ED Notes (Signed)
Patient complaining of chest pain. States pain began approximately 30 minutes. Describes pain as sharp with radiation to back. Located left center chest. 12 lead EKG done. EDP notified. Will continue to monitor patient.

## 2011-09-08 NOTE — ED Notes (Signed)
PT ambulatory at discharge, pt states spouse is picking her up. Pt denies any chest pain.

## 2011-09-08 NOTE — Progress Notes (Signed)
Patient reports some CP over the night, treated with Morphine. Patient denies CP or SOB today. Patient afebrile. Normal S1, S2, without gallop, rub, or murmur. Lungs CTA. No lower extremity edema. Patient CTA remarkable for 25-50% of LAD, also remarkable for solitary pulmonary nodule 8 mm in size that will need monitoring in 6 months. Patient informed of these findings and need for cardiology referral and repeat imaging of her chest in the future. Per radiologist none of the finding explain her CP. Patient pain is most like musculoskeletal. Patient discharged on muscle relaxer with return precautions given.  INDICATION: 66 year old female with history of chest pain.  CT ANGIOGRAPHY OF THE HEART, CORONARY ARTERY, STRUCTURE, AND  MORPHOLOGY  CONTRAST: 80mL OMNIPAQUE IOHEXOL 350 MG/ML SOLN  COMPARISON: None  TECHNIQUE: CT angiography of the coronary vessels was performed on  a 256 channel system using prospective ECG gating. A scout and  noncontrast exam (for calcium scoring) were performed. Circulation  time was measured using a test bolus. Coronary CTA was performed  with sub mm slice collimation during portions of the cardiac cycle  after prior injection of iodinated contrast. Imaging post  processing was performed on an independent workstation creating  multiplanar and 3-D images, and quantitative analysis of the heart  and coronary arteries. Note that this exam targets the heart and  the chest was not imaged in its entirety.  PREMEDICATION:  Lopressor 50 mg, P.O.  Nitroglycerin 400 mcg, sublingual.  FINDINGS:  Technical quality: Excellent  Heart rate: 56  CORONARY ARTERIES:  Left main coronary artery: Eccentric calcified plaque distally  with no stenosis.  Left anterior descending: Moderately diseased with mixed calcified  and noncalcified atherosclerotic plaque, most significantly  proximally where there is 25-50% stenosis.  Left circumflex: Small amount of mixed plaque proximally  resulting  in only 0-25% stenosis.  Right coronary artery: Negative.  Posterior descending artery: Negative.  Dominance: Codominant.  CORONARY CALCIUM:  Total Agatston Score: 173  MESA database percentile: 88th  AORTA AND PULMONARY MEASUREMENTS:  Aortic root (21 - 40 mm):  23 mm at the annulus  34 mm at the sinuses of Valsalva  27 mm at the sinotubular junction  Ascending aorta ( < 40 mm): 32 mm  Descending aorta ( < 40 mm): 22 mm  Main pulmonary artery: ( < 30 mm): 26 mm  EXTRACARDIAC FINDINGS:  Multiple pulmonary nodules scattered throughout the lungs  bilaterally. These include a partially calcified nodule in the  posterior aspect of the right lower lobe measuring 5 mm (image 21  of series 5), likely representing granuloma. In addition, however,  there is a noncalcified 8-mm nodule in the lateral aspect of the  left lower lobe (image 33 of series 5) which warrants attention on  follow-up studies. Areas of subsegmental atelectasis and/or  scarring in the inferior segment of the lingula and in the right  middle lobe. Within the visualized thorax there is no acute  consolidative airspace disease or pleural effusions. Visualized  portions of the upper abdomen are unremarkable. There are no  aggressive appearing lytic or blastic lesions noted in the  visualized portions of the skeleton.  IMPRESSION:  1. Mild nonobstructive coronary artery disease, as detailed above.  The patient's total coronary artery calcium score is 173 which is  88th percentile for patients of matched age, gender and  race/ethnicity.  2. Multiple pulmonary nodules scattered throughout the lungs  bilaterally. The largest of these is 8 mm in the left lower lobe.  If the  patient is at high risk for bronchogenic carcinoma, follow-  up chest CT at 3-6 months is recommended. If the patient is at low  risk for bronchogenic carcinoma, follow-up chest CT at 6-12 months  is recommended. This recommendation follows the  consensus  statement: Guidelines for Management of Small Pulmonary Nodules  Detected on CT Scans: A Statement from the Fleischner Society as  published in Radiology 2005; 237:395-400.  3. Codominance of the coronary arteries

## 2011-09-08 NOTE — ED Provider Notes (Signed)
Medical screening examination/treatment/procedure(s) were performed by non-physician practitioner and as supervising physician I was immediately available for consultation/collaboration.   Lyanne Co, MD 09/08/11 [phone]

## 2011-09-08 NOTE — ED Notes (Signed)
Returned from CT. Pt placed back on monitor. Pt ambulatory to bed. Pt denies any pain at this time.

## 2011-09-10 NOTE — Progress Notes (Signed)
  Medical screening examination/treatment/procedure(s) were performed by non-physician practitioner and as supervising physician I was immediately available for consultation/collaboration.     

## 2011-09-29 DIAGNOSIS — I1310 Hypertensive heart and chronic kidney disease without heart failure, with stage 1 through stage 4 chronic kidney disease, or unspecified chronic kidney disease: Secondary | ICD-10-CM | POA: Diagnosis not present

## 2011-09-29 DIAGNOSIS — Z0000 Encounter for general adult medical examination without abnormal findings: Secondary | ICD-10-CM | POA: Diagnosis not present

## 2011-09-29 DIAGNOSIS — Z79899 Other long term (current) drug therapy: Secondary | ICD-10-CM | POA: Diagnosis not present

## 2011-09-29 DIAGNOSIS — I129 Hypertensive chronic kidney disease with stage 1 through stage 4 chronic kidney disease, or unspecified chronic kidney disease: Secondary | ICD-10-CM | POA: Diagnosis not present

## 2011-09-29 DIAGNOSIS — E7800 Pure hypercholesterolemia, unspecified: Secondary | ICD-10-CM | POA: Diagnosis not present

## 2011-09-29 DIAGNOSIS — N182 Chronic kidney disease, stage 2 (mild): Secondary | ICD-10-CM | POA: Diagnosis not present

## 2011-09-29 DIAGNOSIS — R918 Other nonspecific abnormal finding of lung field: Secondary | ICD-10-CM | POA: Diagnosis not present

## 2011-10-06 ENCOUNTER — Telehealth: Payer: Self-pay | Admitting: Gastroenterology

## 2011-10-06 NOTE — Telephone Encounter (Signed)
Forward 12 pages from [facility] to Dr. Rob Bunting for review on 10-06-11 ym

## 2011-10-11 DIAGNOSIS — I2510 Atherosclerotic heart disease of native coronary artery without angina pectoris: Secondary | ICD-10-CM | POA: Diagnosis not present

## 2011-10-11 DIAGNOSIS — E785 Hyperlipidemia, unspecified: Secondary | ICD-10-CM | POA: Diagnosis not present

## 2011-10-11 DIAGNOSIS — I10 Essential (primary) hypertension: Secondary | ICD-10-CM | POA: Diagnosis not present

## 2011-10-11 DIAGNOSIS — R072 Precordial pain: Secondary | ICD-10-CM | POA: Diagnosis not present

## 2011-10-13 DIAGNOSIS — I10 Essential (primary) hypertension: Secondary | ICD-10-CM | POA: Diagnosis not present

## 2011-10-13 DIAGNOSIS — R072 Precordial pain: Secondary | ICD-10-CM | POA: Diagnosis not present

## 2011-10-13 DIAGNOSIS — E785 Hyperlipidemia, unspecified: Secondary | ICD-10-CM | POA: Diagnosis not present

## 2011-10-13 DIAGNOSIS — I2510 Atherosclerotic heart disease of native coronary artery without angina pectoris: Secondary | ICD-10-CM | POA: Diagnosis not present

## 2011-11-05 ENCOUNTER — Other Ambulatory Visit (INDEPENDENT_AMBULATORY_CARE_PROVIDER_SITE_OTHER): Payer: Medicare Other

## 2011-11-05 ENCOUNTER — Encounter: Payer: Self-pay | Admitting: Gastroenterology

## 2011-11-05 ENCOUNTER — Ambulatory Visit (INDEPENDENT_AMBULATORY_CARE_PROVIDER_SITE_OTHER): Payer: Medicare Other | Admitting: Gastroenterology

## 2011-11-05 VITALS — BP 136/82 | HR 80 | Ht 63.75 in | Wt 154.1 lb

## 2011-11-05 DIAGNOSIS — K5900 Constipation, unspecified: Secondary | ICD-10-CM

## 2011-11-05 DIAGNOSIS — R194 Change in bowel habit: Secondary | ICD-10-CM

## 2011-11-05 DIAGNOSIS — R198 Other specified symptoms and signs involving the digestive system and abdomen: Secondary | ICD-10-CM

## 2011-11-05 LAB — CBC WITH DIFFERENTIAL/PLATELET
Basophils Absolute: 0 10*3/uL (ref 0.0–0.1)
Basophils Relative: 0.3 % (ref 0.0–3.0)
Eosinophils Absolute: 0.1 10*3/uL (ref 0.0–0.7)
Eosinophils Relative: 0.7 % (ref 0.0–5.0)
HCT: 40.4 % (ref 36.0–46.0)
Hemoglobin: 13.1 g/dL (ref 12.0–15.0)
Lymphocytes Relative: 18.1 % (ref 12.0–46.0)
Lymphs Abs: 1.5 10*3/uL (ref 0.7–4.0)
MCHC: 32.5 g/dL (ref 30.0–36.0)
MCV: 95.1 fl (ref 78.0–100.0)
Monocytes Absolute: 0.6 10*3/uL (ref 0.1–1.0)
Monocytes Relative: 7.5 % (ref 3.0–12.0)
Neutro Abs: 5.9 10*3/uL (ref 1.4–7.7)
Neutrophils Relative %: 73.4 % (ref 43.0–77.0)
Platelets: 289 10*3/uL (ref 150.0–400.0)
RBC: 4.25 Mil/uL (ref 3.87–5.11)
RDW: 14.1 % (ref 11.5–14.6)
WBC: 8.1 10*3/uL (ref 4.5–10.5)

## 2011-11-05 LAB — TSH: TSH: 1.2 u[IU]/mL (ref 0.35–5.50)

## 2011-11-05 NOTE — Patient Instructions (Addendum)
Please start taking citrucel (orange flavored) powder fiber supplement.  This may cause some bloating at first but that usually goes away. Begin with a small spoonful and work your way up to a large, heaping spoonful daily over a week. 2 sets of home FOBT cards, please complete and send back. You will have labs checked today in the basement lab.  Please head down after you check out with the front desk  (cbc, tsh). Call Dr. Christella Hartigan office in 4-5 weeks of daily fiber to report on your symptoms. Will decide on colonoscopy based on the above, however you will need one in 2 years for FH at the very latest.

## 2011-11-05 NOTE — Progress Notes (Signed)
Review of pertinent gastrointestinal problems: 1.  Family history of colon cancer (both parents): Colonoscopy 05/2008 with Dr. Loreta Ave done for "personal history of polyps, FH of colon cancer (mother & father)" Two "small cecal polyps" that were not adenomas by pathology; otherwise normal examination. She was recommended to have repeat colonoscopy at 3 year interval for unclear reasons. 2.  Halitosis led to EGD done 2012 was found small HH only, was referred to rheum due to esophagram suggesting scleroderma. Rheum said she does NOT have scleroderma.   HPI: This is a   very pleasant 66 year old woman whom I last saw about a year ago at the time of an EGD. She is here today to discuss a new problem.  Noticed a change in her bowels. She has started having alternating constip/diarrhea.  Pellet like stools, sometimes has to strain to move her bowels.  Changes have been for 6 weeks.  No dietary changes.  Both of her parents died of colon cancer.  Review of systems: Pertinent positive and negative review of systems were noted in the above HPI section. Complete review of systems was performed and was otherwise normal.    Past Medical History  Diagnosis Date  . Colon polyps   . Hyperlipidemia 1986  . Hypertension 1986  . Interstitial cystitis 1999  . Kidney stones 2005  . GERD (gastroesophageal reflux disease)   . Osteoporosis     Past Surgical History  Procedure Date  . Hysteroscopy   . Tonsillectomy   . Colonoscopy   . Polypectomy   . Laparoscopic hysterectomy     Current Outpatient Prescriptions  Medication Sig Dispense Refill  . CALCIUM PO Take 1 tablet by mouth daily.      . Cholecalciferol (VITAMIN D PO) Take 1 tablet by mouth daily.      . finasteride (PROSCAR) 5 MG tablet Take 2.5 mg by mouth daily.        . fish oil-omega-3 fatty acids 1000 MG capsule Take 2 g by mouth daily.      Marland Kitchen lisinopril (PRINIVIL,ZESTRIL) 5 MG tablet Take 5 mg by mouth daily.        Marland Kitchen MAGNESIUM PO  Take 1 tablet by mouth daily.      . Multiple Vitamin (MULTIVITAMIN WITH MINERALS) TABS Take 1 tablet by mouth daily.      . risedronate (ACTONEL) 150 MG tablet Take 150 mg by mouth every 30 (thirty) days. with water on empty stomach, nothing by mouth or lie down for next 30 minutes.        Current Facility-Administered Medications  Medication Dose Route Frequency Provider Last Rate Last Dose  . 0.9 %  sodium chloride infusion  500 mL Intravenous Continuous Rachael Fee, MD        Allergies as of 11/05/2011 - Review Complete 11/05/2011  Allergen Reaction Noted  . Hctz (hydrochlorothiazide) Itching 05/29/2010    Family History  Problem Relation Age of Onset  . Colon cancer Father     mother, PGM  . Stroke Father   . Hypertension Father   . Hypertension Mother   . Heart disease Mother     History   Social History  . Marital Status: Married    Spouse Name: N/A    Number of Children: 2  . Years of Education: N/A   Occupational History  . retired    Social History Main Topics  . Smoking status: Never Smoker   . Smokeless tobacco: Never Used  . Alcohol Use: 3.5  oz/week    7 drink(s) per week  . Drug Use: No  . Sexually Active: Not on file   Other Topics Concern  . Not on file   Social History Narrative  . No narrative on file       Physical Exam: BP 136/82  Pulse 80  Ht 5' 3.75" (1.619 m)  Wt 154 lb 2 oz (69.911 kg)  BMI 26.66 kg/m2 Constitutional: generally well-appearing Psychiatric: alert and oriented x3 Eyes: extraocular movements intact Mouth: oral pharynx moist, no lesions Neck: supple no lymphadenopathy Cardiovascular: heart regular rate and rhythm Lungs: clear to auscultation bilaterally Abdomen: soft, nontender, nondistended, no obvious ascites, no peritoneal signs, normal bowel sounds Extremities: no lower extremity edema bilaterally Skin: no lesions on visible extremities    Assessment and plan: 66 y.o. female with  significant family  history of colon cancer, recent change in her bowels.  She had a colonoscopy just over 3 years ago and this found no precancerous polyps. I think it is unlikely that she currently has any significant problems in her colon such as cancer. More likely she has just routine functional bowel change which is not uncommon. I recommended she try fiber supplements for the next several weeks and call to report on her response. She will also complete 2 sets of fecal occult stools cards. If any are positive then I will recommend colonoscopy. If she does not respond completely to fiber and will recommend colonoscopy. Lasted she will get a CBC in thyroid testing. At the very latest she will need colonoscopy again in 2 years for her family history.

## 2011-11-11 DIAGNOSIS — L723 Sebaceous cyst: Secondary | ICD-10-CM | POA: Diagnosis not present

## 2011-11-11 DIAGNOSIS — L821 Other seborrheic keratosis: Secondary | ICD-10-CM | POA: Diagnosis not present

## 2011-11-11 DIAGNOSIS — L659 Nonscarring hair loss, unspecified: Secondary | ICD-10-CM | POA: Diagnosis not present

## 2011-11-18 ENCOUNTER — Encounter: Payer: Self-pay | Admitting: *Deleted

## 2011-11-18 ENCOUNTER — Encounter: Payer: Self-pay | Admitting: Internal Medicine

## 2011-11-19 ENCOUNTER — Encounter: Payer: Self-pay | Admitting: Internal Medicine

## 2011-11-19 ENCOUNTER — Ambulatory Visit (INDEPENDENT_AMBULATORY_CARE_PROVIDER_SITE_OTHER): Payer: Medicare Other | Admitting: Internal Medicine

## 2011-11-19 VITALS — BP 122/76 | HR 64 | Ht 63.5 in | Wt 155.0 lb

## 2011-11-19 DIAGNOSIS — R6889 Other general symptoms and signs: Secondary | ICD-10-CM | POA: Diagnosis not present

## 2011-11-19 DIAGNOSIS — R196 Halitosis: Secondary | ICD-10-CM

## 2011-11-19 DIAGNOSIS — R918 Other nonspecific abnormal finding of lung field: Secondary | ICD-10-CM

## 2011-11-19 MED ORDER — MOMETASONE FUROATE 50 MCG/ACT NA SUSP: 2.0000 | Freq: Every day | NASAL | Status: DC

## 2011-11-19 NOTE — Patient Instructions (Addendum)
Order- Schedule future CT chest,non-contrast in March, 2014      Dx lung nodules  Order- Quantiferon gold test for TB   Order- schedule PFT   Dx lung nodules  Sample nasonex nasal spray--       Try 1-2 puffs each nostril once every day at bedtime   Please call as needed

## 2011-11-19 NOTE — Progress Notes (Signed)
11/19/11-  75 yoF never smoker referred courtesy of Dr Dorothyann Peng, concerned about lung nodules seen on cardiac CT. She does not get the flu shot. Lung nodules were new discovery at the time of a cardiac CT being done to evaluate chest pain 09/08/2011. She has no history of lung disease except for pneumonia 8 years ago treated as an outpatient. She has not lived in areas of the country endemic for nodular fungus infection and has no history of TB exposure. She is married to a smoker-discussed secondhand exposure risk. She denies sweats, fevers or swollen glands. She often coughs during meals but otherwise does not recognize chronic cough. Her personal main concern is halitosis which has been reported to her by "people". She has had negative workups by dentist, gastroenterologist, ENT. She has no history of sinus disease. Empiric trial of antibiotic made no difference. Failed saline nasal rinse. CT Cor Morph  09/08/11-reviewed with her IMPRESSION:  1. Mild nonobstructive coronary artery disease, as detailed above.  The patient's total coronary artery calcium score is 173 which is  88th percentile for patients of matched age, gender and  race/ethnicity.  2. Multiple pulmonary nodules scattered throughout the lungs  bilaterally. The largest of these is 8 mm in the left lower lobe.  If the patient is at high risk for bronchogenic carcinoma, follow-  up chest CT at 3-6 months is recommended. If the patient is at low  risk for bronchogenic carcinoma, follow-up chest CT at 6-12 months  is recommended. This recommendation follows the consensus  statement: Guidelines for Management of Small Pulmonary Nodules  Detected on CT Scans: A Statement from the Fleischner Society as  published in Radiology 2005; 237:395-400.  3. Codominance of the coronary arteries.  Report was called to CDU mid level at [phone] at 10:30 AM on  09/08/2011.  Original Report Authenticated By: Florencia Reasons, M.D.   Prior  to Admission medications   Medication Sig Start Date End Date Taking? Authorizing Provider  CALCIUM PO Take 1 tablet by mouth daily.   Yes Historical Provider, MD  Cholecalciferol (VITAMIN D PO) Take 1 tablet by mouth daily.   Yes Historical Provider, MD  finasteride (PROSCAR) 5 MG tablet Take 2.5 mg by mouth daily.     Yes Historical Provider, MD  fish oil-omega-3 fatty acids 1000 MG capsule Take 2 g by mouth daily.   Yes Historical Provider, MD  lisinopril (PRINIVIL,ZESTRIL) 5 MG tablet Take 5 mg by mouth daily.     Yes Historical Provider, MD  risedronate (ACTONEL) 150 MG tablet Take 150 mg by mouth every 30 (thirty) days. with water on empty stomach, nothing by mouth or lie down for next 30 minutes.    Yes Historical Provider, MD  rosuvastatin (CRESTOR) 10 MG tablet Take 10 mg by mouth daily.   Yes Historical Provider, MD  mometasone (NASONEX) 50 MCG/ACT nasal spray Place 2 sprays into the nose daily. 11/19/11 11/18/12  Waymon Budge, MD  Multiple Vitamin (MULTIVITAMIN WITH MINERALS) TABS Take 1 tablet by mouth daily.    Historical Provider, MD   Family History  Problem Relation Age of Onset  . Coronary artery disease Father     mother, PGM  . Stroke Father   . Hypertension Father   . Hypertension Mother   . Heart disease Mother   . Diabetes     History   Social History  . Marital Status: Married    Spouse Name: N/A    Number of Children:  2  . Years of Education: N/A   Occupational History  . retired    Social History Main Topics  . Smoking status: Never Smoker   . Smokeless tobacco: Never Used  . Alcohol Use: 4.2 oz/week    7 Glasses of wine per week  . Drug Use: No  . Sexually Active: Not on file   Other Topics Concern  . Not on file   Social History Narrative  . No narrative on file   ROS-see HPI Constitutional:   No-   weight loss, night sweats, fevers, chills, fatigue, lassitude. HEENT:   No-  headaches, difficulty swallowing, tooth/dental problems, sore  throat,       No-  sneezing, itching, ear ache, nasal congestion, post nasal drip,  CV:  +chest pain, orthopnea, PND, swelling in lower extremities, anasarca, dizziness, palpitations Resp: No-   shortness of breath with exertion or at rest.              No-   productive cough,  No non-productive cough,  No- coughing up of blood.              No-   change in color of mucus.  No- wheezing.   Skin: No-   rash or lesions. GI:  No-   heartburn, indigestion, abdominal pain, nausea, vomiting, diarrhea,                 change in bowel habits, loss of appetite GU: No-   dysuria, change in color of urine, no urgency or frequency.  No- flank pain. MS:  No-   joint pain or swelling.  No- decreased range of motion.  No- back pain. Neuro-     nothing unusual Psych:  No- change in mood or affect. No depression or anxiety.  No memory loss.  OBJ- Physical Exam General- Alert, Oriented, Affect-appropriate, Distress- none acute. No evidence halitosis Skin- rash-none, lesions- none, excoriation- none Lymphadenopathy- none Head- atraumatic            Eyes- Gross vision intact, PERRLA, conjunctivae and secretions clear            Ears- Hearing, canals-normal            Nose- Clear, no-Septal dev, mucus, polyps, erosion, perforation             Throat- Mallampati II , mucosa clear , drainage- none, tonsils- atrophic Neck- flexible , trachea midline, no stridor , thyroid nl, carotid no bruit Chest - symmetrical excursion , unlabored           Heart/CV- RRR , no murmur , no gallop  , no rub, nl s1 s2                           - JVD- none , edema- none, stasis changes- none, varices- none           Lung- clear to P&A, wheeze- none, cough- none , dullness-none, rub- none           Chest wall-  Abd- tender-no, distended-no, bowel sounds-present, HSM- no Br/ Gen/ Rectal- Not done, not indicated Extrem- cyanosis- none, clubbing, none, atrophy- none, strength- nl Neuro- grossly intact to observation

## 2011-11-22 ENCOUNTER — Other Ambulatory Visit: Payer: Medicare Other

## 2011-11-22 ENCOUNTER — Ambulatory Visit (INDEPENDENT_AMBULATORY_CARE_PROVIDER_SITE_OTHER): Payer: Medicare Other | Admitting: Internal Medicine

## 2011-11-22 DIAGNOSIS — R911 Solitary pulmonary nodule: Secondary | ICD-10-CM

## 2011-11-22 DIAGNOSIS — R918 Other nonspecific abnormal finding of lung field: Secondary | ICD-10-CM | POA: Diagnosis not present

## 2011-11-22 LAB — PULMONARY FUNCTION TEST

## 2011-11-22 NOTE — Progress Notes (Signed)
PFT done today. 

## 2011-11-24 LAB — QUANTIFERON TB GOLD ASSAY (BLOOD): Interferon Gamma Release Assay: NEGATIVE

## 2011-11-26 ENCOUNTER — Other Ambulatory Visit: Payer: Medicare Other

## 2011-11-26 LAB — HEMOCCULT SLIDES (X 3 CARDS)
Fecal Occult Blood: NEGATIVE
OCCULT 1: NEGATIVE
OCCULT 2: NEGATIVE
OCCULT 3: NEGATIVE
OCCULT 4: NEGATIVE
OCCULT 5: NEGATIVE

## 2011-11-28 DIAGNOSIS — R196 Halitosis: Secondary | ICD-10-CM | POA: Insufficient documentation

## 2011-11-28 DIAGNOSIS — R918 Other nonspecific abnormal finding of lung field: Secondary | ICD-10-CM | POA: Insufficient documentation

## 2011-11-28 NOTE — Assessment & Plan Note (Signed)
This is her complaint based on comments others have made. She reports negative UA shows by a dentist, ENT and gastroenterologist with failure to respond to antibiotics and nasal saline. Her breath is not noteworthy on today's exam. Plan-offered sample Nasonex for trial

## 2011-11-28 NOTE — Assessment & Plan Note (Addendum)
Differential diagnosis is between granulomas and cancer, possibly metastatic. We discussed the differential diagnosis and reviewed her images. Plan-repeat CT chest in 6 months after the first. Blood test for TB, schedule PFT.

## 2011-11-29 ENCOUNTER — Telehealth: Payer: Self-pay | Admitting: Gastroenterology

## 2011-11-29 ENCOUNTER — Telehealth: Payer: Self-pay | Admitting: Internal Medicine

## 2011-11-29 DIAGNOSIS — E7800 Pure hypercholesterolemia, unspecified: Secondary | ICD-10-CM | POA: Diagnosis not present

## 2011-11-29 DIAGNOSIS — Z79899 Other long term (current) drug therapy: Secondary | ICD-10-CM | POA: Diagnosis not present

## 2011-11-29 DIAGNOSIS — R079 Chest pain, unspecified: Secondary | ICD-10-CM | POA: Diagnosis not present

## 2011-11-29 DIAGNOSIS — I10 Essential (primary) hypertension: Secondary | ICD-10-CM | POA: Diagnosis not present

## 2011-11-29 NOTE — Progress Notes (Signed)
Quick Note:  Spoke with pt and notified of results per Dr. Young. Pt verbalized understanding and denied any questions.  ______ 

## 2011-11-29 NOTE — Telephone Encounter (Signed)
Marissa Orozco was calling to inform pt:  Notes Recorded by Waymon Budge, MD on 11/25/2011 at 1:07 PM TB test was negative.  Also, needs to inform pt hemmocult cards:  Notes Recorded by Waymon Budge, MD on 11/26/2011 at 8:32 PM Negative for evidence of GI bleeding   ----   [id number]

## 2011-11-29 NOTE — Telephone Encounter (Signed)
Spoke with pt and notified of results per Dr. Young Pt verbalized understanding and denied any questions. 

## 2011-11-30 NOTE — Telephone Encounter (Signed)
Great, that is good news.  Should continue with the suggestions outlined at recent visit (stay on fiber), call if any problems.  Also noted that recent fobt testing was all negative.

## 2011-12-03 ENCOUNTER — Encounter: Payer: Self-pay | Admitting: Internal Medicine

## 2012-03-20 ENCOUNTER — Ambulatory Visit: Payer: Medicare Other | Admitting: Internal Medicine

## 2012-03-21 ENCOUNTER — Ambulatory Visit (INDEPENDENT_AMBULATORY_CARE_PROVIDER_SITE_OTHER)
Admission: RE | Admit: 2012-03-21 | Discharge: 2012-03-21 | Disposition: A | Discharge status: Home / Self Care | Payer: Medicare Other | Source: Ambulatory Visit | Attending: Internal Medicine | Admitting: Internal Medicine

## 2012-03-21 DIAGNOSIS — R918 Other nonspecific abnormal finding of lung field: Secondary | ICD-10-CM

## 2012-03-21 DIAGNOSIS — J984 Other disorders of lung: Secondary | ICD-10-CM | POA: Diagnosis not present

## 2012-03-22 NOTE — Progress Notes (Signed)
Quick Note:  LMTCB ______ 

## 2012-03-22 NOTE — Progress Notes (Signed)
Quick Note:  Pt aware of results and next OV with CY. ______ 

## 2012-03-24 ENCOUNTER — Encounter: Payer: Self-pay | Admitting: Internal Medicine

## 2012-03-24 ENCOUNTER — Ambulatory Visit (INDEPENDENT_AMBULATORY_CARE_PROVIDER_SITE_OTHER): Payer: Medicare Other | Admitting: Internal Medicine

## 2012-03-24 VITALS — BP 118/76 | HR 84 | Ht 63.75 in | Wt 153.0 lb

## 2012-03-24 DIAGNOSIS — R918 Other nonspecific abnormal finding of lung field: Secondary | ICD-10-CM

## 2012-03-24 NOTE — Progress Notes (Signed)
11/19/11-  62 yoF never smoker referred courtesy of Dr Dorothyann Peng, concerned about lung nodules seen on cardiac CT. She does not get the flu shot. Lung nodules were new discovery at the time of a cardiac CT being done to evaluate chest pain 09/08/2011. She has no history of lung disease except for pneumonia 8 years ago treated as an outpatient. She has not lived in areas of the country endemic for nodular fungus infection and has no history of TB exposure. She is married to a smoker-discussed secondhand exposure risk. She denies sweats, fevers or swollen glands. She often coughs during meals but otherwise does not recognize chronic cough. Her personal main concern is halitosis which has been reported to her by "people". She has had negative workups by dentist, gastroenterologist, ENT. She has no history of sinus disease. Empiric trial of antibiotic made no difference. Failed saline nasal rinse. CT Cor Morph  09/08/11-reviewed with her IMPRESSION:  1. Mild nonobstructive coronary artery disease, as detailed above.  The patient's total coronary artery calcium score is 173 which is  88th percentile for patients of matched age, gender and  race/ethnicity.  2. Multiple pulmonary nodules scattered throughout the lungs  bilaterally. The largest of these is 8 mm in the left lower lobe.  If the patient is at high risk for bronchogenic carcinoma, follow-  up chest CT at 3-6 months is recommended. If the patient is at low  risk for bronchogenic carcinoma, follow-up chest CT at 6-12 months  is recommended. This recommendation follows the consensus  statement: Guidelines for Management of Small Pulmonary Nodules  Detected on CT Scans: A Statement from the Fleischner Society as  published in Radiology 2005; 237:395-400.  3. Codominance of the coronary arteries.  Report was called to CDU mid level at [phone] at 10:30 AM on  09/08/2011.    03/24/12- 66 yoF never smoker referred courtesy of Dr Dorothyann Peng, concerned about lung nodules seen on cardiac CT FOLLOWS FOR: denies any wheezing, SOB, cough, or congestion. PFT: 11/22/2011-within normal-FVC 2.66/92%, FEV1 2.21/106%, FEV1/FVC 0.83, TLC 92%, DLCO 82% She has been doing well. Quantiferon TB gold- negative 11/18  /13 CT chest 03/22/12- radiology report and images reviewed with her IMPRESSION:  Unchanged pulmonary nodules compared to 09/08/2011. The largest is  in the left lower lobe measuring 8 mm (image number 39 series 3).  This study constitutes 29-month follow-up. If the patient is low  risk, follow-up in 12-18 months is recommended. If patient is high  risk, next follow-up in 6 months recommended. This recommendation  follows the consensus statement: Guidelines for Management of Small  Pulmonary Nodules Detected on CT Scans: A Statement from the  Fleischner Society as published in Radiology 2005; 237:395-400.  Original Report Authenticated By: Andreas Newport, M.D.  ROS-see HPI Constitutional:   No-   weight loss, night sweats, fevers, chills, fatigue, lassitude. HEENT:   No-  headaches, difficulty swallowing, tooth/dental problems, sore throat,       No-  sneezing, itching, ear ache, nasal congestion, post nasal drip,  CV:  No-chest pain, orthopnea, PND, swelling in lower extremities, anasarca, dizziness, palpitations Resp: No-   shortness of breath with exertion or at rest.              No-   productive cough,  No non-productive cough,  No- coughing up of blood.              No-   change in color of mucus.  No- wheezing.  Skin: No-   rash or lesions. GI:  No-   heartburn, indigestion, abdominal pain, nausea, vomiting,  GU:  MS:  No-   joint pain or swelling.   Neuro-     nothing unusual Psych:  No- change in mood or affect. No depression or anxiety.  No memory loss.  OBJ- Physical Exam General- Alert, Oriented, Affect-appropriate, Distress- none acute. No evidence halitosis Skin- rash-none, lesions- none, excoriation-  none Lymphadenopathy- none Head- atraumatic            Eyes- Gross vision intact, PERRLA, conjunctivae and secretions clear            Ears- Hearing, canals-normal            Nose- Clear, no-Septal dev, mucus, polyps, erosion, perforation             Throat- Mallampati II , mucosa clear , drainage- none, tonsils- atrophic Neck- flexible , trachea midline, no stridor , thyroid nl, carotid no bruit Chest - symmetrical excursion , unlabored           Heart/CV- RRR , no murmur , no gallop  , no rub, nl s1 s2                           - JVD- none , edema- none, stasis changes- none, varices- none           Lung- clear to P&A, wheeze- none, cough- none , dullness-none, rub- none           Chest wall-  Abd-  Br/ Gen/ Rectal- Not done, not indicated Extrem- cyanosis- none, clubbing, none, atrophy- none, strength- nl Neuro- grossly intact to observation

## 2012-03-24 NOTE — Patient Instructions (Addendum)
We will see you back in a year and can arrange one more follow-up CT scan then.   If you have concerns before we see you again, please call.

## 2012-03-30 DIAGNOSIS — H40059 Ocular hypertension, unspecified eye: Secondary | ICD-10-CM | POA: Diagnosis not present

## 2012-04-01 ENCOUNTER — Encounter: Payer: Self-pay | Admitting: Internal Medicine

## 2012-04-01 NOTE — Assessment & Plan Note (Signed)
Low concern small pulmonary nodules stable over 6 months. We considered the radiology recommendation and will schedule followup CT in one year.

## 2012-04-13 DIAGNOSIS — Z79899 Other long term (current) drug therapy: Secondary | ICD-10-CM | POA: Diagnosis not present

## 2012-04-13 DIAGNOSIS — E7800 Pure hypercholesterolemia, unspecified: Secondary | ICD-10-CM | POA: Diagnosis not present

## 2012-04-13 DIAGNOSIS — I10 Essential (primary) hypertension: Secondary | ICD-10-CM | POA: Diagnosis not present

## 2012-04-13 DIAGNOSIS — Z131 Encounter for screening for diabetes mellitus: Secondary | ICD-10-CM | POA: Diagnosis not present

## 2012-04-27 DIAGNOSIS — H40059 Ocular hypertension, unspecified eye: Secondary | ICD-10-CM | POA: Diagnosis not present

## 2012-05-22 ENCOUNTER — Other Ambulatory Visit: Payer: Self-pay

## 2012-05-22 DIAGNOSIS — Z1231 Encounter for screening mammogram for malignant neoplasm of breast: Secondary | ICD-10-CM

## 2012-06-01 ENCOUNTER — Encounter (HOSPITAL_COMMUNITY): Payer: Self-pay | Admitting: *Deleted

## 2012-06-01 ENCOUNTER — Emergency Department (INDEPENDENT_AMBULATORY_CARE_PROVIDER_SITE_OTHER)
Admission: EM | Admit: 2012-06-01 | Discharge: 2012-06-01 | Disposition: A | Discharge status: Home / Self Care | Payer: Medicare Other | Source: Home / Self Care | Attending: Emergency Medicine | Admitting: Emergency Medicine

## 2012-06-01 DIAGNOSIS — Z23 Encounter for immunization: Secondary | ICD-10-CM

## 2012-06-01 DIAGNOSIS — T148XXA Other injury of unspecified body region, initial encounter: Secondary | ICD-10-CM

## 2012-06-01 MED ORDER — TETANUS-DIPHTH-ACELL PERTUSSIS 5-2.5-18.5 LF-MCG/0.5 IM SUSP
INTRAMUSCULAR | Status: AC
  Filled 2012-06-01: qty 0.5

## 2012-06-01 MED ORDER — TETANUS-DIPHTH-ACELL PERTUSSIS 5-2.5-18.5 LF-MCG/0.5 IM SUSP
0.5000 mL | Freq: Once | INTRAMUSCULAR | Status: AC
  Administered 2012-06-01: 0.5 mL via INTRAMUSCULAR
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
## 2012-06-01 NOTE — ED Provider Notes (Signed)
Chief Complaint:   Chief Complaint  Patient presents with  . Puncture Wound    History of Present Illness:   Marissa Orozco is a 67 year old female who punctured the tip of her right index finger on a bolt from a wire brush about 2 hours ago. She cannot actually see the puncture wound. It feels minimally sore. There is no numbness or tingling. No swelling, erythema, or purulent drainage. The finger itself has a full range of motion without any pain. She cannot recall when she received her last tetanus vaccine.  Review of Systems:  Other than noted above, the patient denies any of the following symptoms: Systemic:  No fever or chills. Musculoskeletal:  No joint pain or decreased range of motion. Neuro:  No numbness, tingling, or weakness.  PMFSH:  Past medical history, family history, social history, meds, and allergies were reviewed.  She has hypertension, hypercholesterolemia, and osteoporosis. She takes lisinopril, Crestor, Actonel, and finasteride. She is allergic to hydrochlorothiazide.  Physical Exam:   Vital signs:  BP 152/77  Pulse 91  Temp(Src) 97.7 F (36.5 C) (Oral)  Resp 16  SpO2 98% Ext:  No visible puncture wound. No pain to palpation. No swelling, erythema, or drainage, all joints the finger have a full range of motion without pain.  All other joints had a full ROM without pain.  Pulses were full.  Good capillary refill in all digits.  No edema. Neurological:  Alert and oriented.  No muscle weakness.  Sensation was intact to light touch.   Course in Urgent Care Center:   Given a Tdap vaccine.  Assessment:  The encounter diagnosis was Puncture wound.  Minimal puncture wound. Does not require any prophylactic antibiotics. Should watch for signs of infection. Given Tdap today.  Plan:   1.  The following meds were prescribed:   New Prescriptions   No medications on file   2.  The patient was instructed in wound care and pain control, and handouts were given. 3.  The  patient was told to return if any sign of infection. 4.  Follow up here if needed for any sign of infection.      Reuben Likes, MD 06/01/12 1355

## 2012-06-29 ENCOUNTER — Ambulatory Visit: Payer: Medicare Other

## 2012-07-27 ENCOUNTER — Ambulatory Visit
Admission: RE | Admit: 2012-07-27 | Discharge: 2012-07-27 | Disposition: A | Discharge status: Home / Self Care | Payer: Medicare Other | Source: Ambulatory Visit

## 2012-07-27 DIAGNOSIS — Z1231 Encounter for screening mammogram for malignant neoplasm of breast: Secondary | ICD-10-CM

## 2012-08-09 ENCOUNTER — Other Ambulatory Visit: Payer: Self-pay

## 2012-10-12 DIAGNOSIS — Z0000 Encounter for general adult medical examination without abnormal findings: Secondary | ICD-10-CM | POA: Diagnosis not present

## 2012-10-12 DIAGNOSIS — E559 Vitamin D deficiency, unspecified: Secondary | ICD-10-CM | POA: Diagnosis not present

## 2012-10-12 DIAGNOSIS — E785 Hyperlipidemia, unspecified: Secondary | ICD-10-CM | POA: Diagnosis not present

## 2012-10-12 DIAGNOSIS — I10 Essential (primary) hypertension: Secondary | ICD-10-CM | POA: Diagnosis not present

## 2012-10-12 DIAGNOSIS — M810 Age-related osteoporosis without current pathological fracture: Secondary | ICD-10-CM | POA: Diagnosis not present

## 2012-10-12 DIAGNOSIS — Z0100 Encounter for examination of eyes and vision without abnormal findings: Secondary | ICD-10-CM | POA: Diagnosis not present

## 2012-10-12 DIAGNOSIS — R7309 Other abnormal glucose: Secondary | ICD-10-CM | POA: Diagnosis not present

## 2012-10-12 DIAGNOSIS — I129 Hypertensive chronic kidney disease with stage 1 through stage 4 chronic kidney disease, or unspecified chronic kidney disease: Secondary | ICD-10-CM | POA: Diagnosis not present

## 2012-10-12 DIAGNOSIS — Z0110 Encounter for examination of ears and hearing without abnormal findings: Secondary | ICD-10-CM | POA: Diagnosis not present

## 2012-10-12 DIAGNOSIS — E7800 Pure hypercholesterolemia, unspecified: Secondary | ICD-10-CM | POA: Diagnosis not present

## 2012-10-12 DIAGNOSIS — N182 Chronic kidney disease, stage 2 (mild): Secondary | ICD-10-CM | POA: Diagnosis not present

## 2012-10-26 DIAGNOSIS — H4011X0 Primary open-angle glaucoma, stage unspecified: Secondary | ICD-10-CM | POA: Diagnosis not present

## 2012-11-06 DIAGNOSIS — L723 Sebaceous cyst: Secondary | ICD-10-CM | POA: Diagnosis not present

## 2012-11-06 DIAGNOSIS — L821 Other seborrheic keratosis: Secondary | ICD-10-CM | POA: Diagnosis not present

## 2012-11-06 DIAGNOSIS — L659 Nonscarring hair loss, unspecified: Secondary | ICD-10-CM | POA: Diagnosis not present

## 2012-11-09 ENCOUNTER — Other Ambulatory Visit: Payer: Self-pay

## 2012-11-28 DIAGNOSIS — H4011X0 Primary open-angle glaucoma, stage unspecified: Secondary | ICD-10-CM | POA: Diagnosis not present

## 2013-01-11 DIAGNOSIS — H4011X0 Primary open-angle glaucoma, stage unspecified: Secondary | ICD-10-CM | POA: Diagnosis not present

## 2013-01-25 DIAGNOSIS — R5381 Other malaise: Secondary | ICD-10-CM | POA: Diagnosis not present

## 2013-01-25 DIAGNOSIS — M810 Age-related osteoporosis without current pathological fracture: Secondary | ICD-10-CM | POA: Diagnosis not present

## 2013-01-25 DIAGNOSIS — Z79899 Other long term (current) drug therapy: Secondary | ICD-10-CM | POA: Diagnosis not present

## 2013-01-25 DIAGNOSIS — E559 Vitamin D deficiency, unspecified: Secondary | ICD-10-CM | POA: Diagnosis not present

## 2013-01-25 DIAGNOSIS — R5383 Other fatigue: Secondary | ICD-10-CM | POA: Diagnosis not present

## 2013-01-25 DIAGNOSIS — R52 Pain, unspecified: Secondary | ICD-10-CM | POA: Diagnosis not present

## 2013-01-25 DIAGNOSIS — K219 Gastro-esophageal reflux disease without esophagitis: Secondary | ICD-10-CM | POA: Diagnosis not present

## 2013-03-05 DIAGNOSIS — M810 Age-related osteoporosis without current pathological fracture: Secondary | ICD-10-CM | POA: Diagnosis not present

## 2013-03-20 ENCOUNTER — Encounter: Payer: Self-pay | Admitting: Gastroenterology

## 2013-03-27 ENCOUNTER — Encounter: Payer: Self-pay | Admitting: Internal Medicine

## 2013-03-27 ENCOUNTER — Ambulatory Visit (INDEPENDENT_AMBULATORY_CARE_PROVIDER_SITE_OTHER): Payer: Medicare Other | Admitting: Internal Medicine

## 2013-03-27 VITALS — BP 132/82 | HR 74 | Ht 63.75 in | Wt 153.6 lb

## 2013-03-27 DIAGNOSIS — R918 Other nonspecific abnormal finding of lung field: Secondary | ICD-10-CM | POA: Diagnosis not present

## 2013-03-27 NOTE — Progress Notes (Signed)
11/19/11-  44 yoF never smoker referred courtesy of Dr Glendale Chard, concerned about lung nodules seen on cardiac CT. She does not get the flu shot. Lung nodules were new discovery at the time of a cardiac CT being done to evaluate chest pain 09/08/2011. She has no history of lung disease except for pneumonia 8 years ago treated as an outpatient. She has not lived in areas of the country endemic for nodular fungus infection and has no history of TB exposure. She is married to a smoker-discussed secondhand exposure risk. She denies sweats, fevers or swollen glands. She often coughs during meals but otherwise does not recognize chronic cough. Her personal main concern is halitosis which has been reported to her by "people". She has had negative workups by dentist, gastroenterologist, ENT. She has no history of sinus disease. Empiric trial of antibiotic made no difference. Failed saline nasal rinse. CT Cor Morph  09/08/11-reviewed with her IMPRESSION:  1. Mild nonobstructive coronary artery disease, as detailed above.  The patient's total coronary artery calcium score is 173 which is  88th percentile for patients of matched age, gender and  race/ethnicity.  2. Multiple pulmonary nodules scattered throughout the lungs  bilaterally. The largest of these is 8 mm in the left lower lobe.  If the patient is at high risk for bronchogenic carcinoma, follow-  up chest CT at 3-6 months is recommended. If the patient is at low  risk for bronchogenic carcinoma, follow-up chest CT at 6-12 months  is recommended. This recommendation follows the consensus  statement: Guidelines for Management of Small Pulmonary Nodules  Detected on CT Scans: A Statement from the [place] as  published in Radiology 2005; 237:395-400.  3. Codominance of the coronary arteries.  Report was called to CDU mid level at [phone] at 10:30 AM on  09/08/2011.    03/24/12- 66 yoF never smoker referred courtesy of Dr Glendale Chard, concerned about lung nodules seen on cardiac CT FOLLOWS FOR: denies any wheezing, SOB, cough, or congestion. PFT: 11/22/2011-within normal-FVC 2.66/92%, FEV1 2.21/106%, FEV1/FVC 0.83, TLC 92%, DLCO 82% She has been doing well. Quantiferon TB gold- negative 11/18  /13 CT chest 03/22/12- radiology report and images reviewed with her IMPRESSION:  Unchanged pulmonary nodules compared to 09/08/2011. The largest is  in the left lower lobe measuring 8 mm (image number 39 series 3).  This study constitutes 104-month follow-up. If the patient is low  risk, follow-up in 12-18 months is recommended. If patient is high  risk, next follow-up in 6 months recommended. This recommendation  follows the consensus statement: Guidelines for Management of Small  Pulmonary Nodules Detected on CT Scans: A Statement from the  [place] as published in Radiology 2005; 237:395-400.  Original Report Authenticated By: Dereck Ligas, M.D.  03/27/13- 26 yoF never smoker referred courtesy of Dr Glendale Chard, concerned about lung nodules seen on cardiac CT FOLLOWS FOR: denies any SOB, wheezing, cough, or congestion since last visit. Married to a smoker. Feels well  ROS-see HPI Constitutional:   No-   weight loss, night sweats, fevers, chills, fatigue, lassitude. HEENT:   No-  headaches, difficulty swallowing, tooth/dental problems, sore throat,       No-  sneezing, itching, ear ache, nasal congestion, post nasal drip,  CV:  No-chest pain, orthopnea, PND, swelling in lower extremities, anasarca, dizziness, palpitations Resp: No-   shortness of breath with exertion or at rest.              No-  productive cough,  No non-productive cough,  No- coughing up of blood.              No-   change in color of mucus.  No- wheezing.   Skin: No-   rash or lesions. GI:  No-   heartburn, indigestion, abdominal pain, nausea, vomiting,  GU:  MS:  No-   joint pain or swelling.   Neuro-     nothing unusual Psych:   No- change in mood or affect. No depression or anxiety.  No memory loss.  OBJ- Physical Exam General- Alert, Oriented, Affect-appropriate, Distress- none acute. No evidence halitosis Skin- rash-none, lesions- none, excoriation- none Lymphadenopathy- none Head- atraumatic            Eyes- Gross vision intact, PERRLA, conjunctivae and secretions clear            Ears- Hearing, canals-normal            Nose- Clear, no-Septal dev, mucus, polyps, erosion, perforation             Throat- Mallampati II , mucosa clear , drainage- none, tonsils- atrophic Neck- flexible , trachea midline, no stridor , thyroid nl, carotid no bruit Chest - symmetrical excursion , unlabored           Heart/CV- RRR , no murmur , no gallop  , no rub, nl s1 s2                           - JVD- none , edema- none, stasis changes- none, varices- none           Lung- clear to P&A, wheeze- none, cough- none , dullness-none, rub- none           Chest wall-  Abd-  Br/ Gen/ Rectal- Not done, not indicated Extrem- cyanosis- none, clubbing, none, atrophy- none, strength- nl Neuro- grossly intact to observation

## 2013-03-27 NOTE — Patient Instructions (Signed)
Order- schedule non-contrast CT chest    Dx lung nodules  Please call as needed

## 2013-04-02 ENCOUNTER — Ambulatory Visit (INDEPENDENT_AMBULATORY_CARE_PROVIDER_SITE_OTHER)
Admission: RE | Admit: 2013-04-02 | Discharge: 2013-04-02 | Disposition: A | Discharge status: Home / Self Care | Payer: Medicare Other | Source: Ambulatory Visit | Attending: Internal Medicine | Admitting: Internal Medicine

## 2013-04-02 DIAGNOSIS — J984 Other disorders of lung: Secondary | ICD-10-CM | POA: Diagnosis not present

## 2013-04-02 DIAGNOSIS — R918 Other nonspecific abnormal finding of lung field: Secondary | ICD-10-CM | POA: Diagnosis not present

## 2013-04-12 ENCOUNTER — Telehealth: Payer: Self-pay | Admitting: Internal Medicine

## 2013-04-12 DIAGNOSIS — H2510 Age-related nuclear cataract, unspecified eye: Secondary | ICD-10-CM | POA: Diagnosis not present

## 2013-04-12 DIAGNOSIS — H4011X0 Primary open-angle glaucoma, stage unspecified: Secondary | ICD-10-CM | POA: Diagnosis not present

## 2013-04-12 NOTE — Telephone Encounter (Signed)
Notes Recorded by Deneise Lever, MD on 04/03/2013 at 4:59 PM CT chest- The lung nodules appear stable and almost certainly benign. We can wait and discuss at scheduled follow-up unless she needs [place] sooner  I spoke with patient about results and she verbalized understanding and had no questions

## 2013-04-16 DIAGNOSIS — I10 Essential (primary) hypertension: Secondary | ICD-10-CM | POA: Diagnosis not present

## 2013-04-16 DIAGNOSIS — Z79899 Other long term (current) drug therapy: Secondary | ICD-10-CM | POA: Diagnosis not present

## 2013-04-16 DIAGNOSIS — E7800 Pure hypercholesterolemia, unspecified: Secondary | ICD-10-CM | POA: Diagnosis not present

## 2013-04-21 ENCOUNTER — Encounter: Payer: Self-pay | Admitting: Internal Medicine

## 2013-04-21 NOTE — Assessment & Plan Note (Signed)
expecting these to be benign Plan- followup chest CT

## 2013-04-26 ENCOUNTER — Encounter: Payer: Self-pay | Admitting: Gastroenterology

## 2013-05-22 ENCOUNTER — Ambulatory Visit (AMBULATORY_SURGERY_CENTER): Payer: Self-pay | Admitting: *Deleted

## 2013-05-22 VITALS — Ht 63.5 in | Wt 152.6 lb

## 2013-05-22 DIAGNOSIS — Z86010 Personal history of colonic polyps: Secondary | ICD-10-CM

## 2013-05-22 MED ORDER — MOVIPREP 100 G PO SOLR: ORAL | Status: DC

## 2013-05-22 NOTE — Progress Notes (Signed)
No allergies to eggs or soy. No problems with anesthesia.  Pt given Emmi instructions for colonoscopy  No oxygen use  No diet drug use  

## 2013-06-04 ENCOUNTER — Encounter: Payer: Self-pay | Admitting: Gastroenterology

## 2013-06-04 ENCOUNTER — Ambulatory Visit (AMBULATORY_SURGERY_CENTER): Payer: Medicare Other | Admitting: Gastroenterology

## 2013-06-04 VITALS — BP 137/78 | HR 50 | Temp 97.6°F | Resp 13 | Ht 63.0 in | Wt 152.0 lb

## 2013-06-04 DIAGNOSIS — Z1211 Encounter for screening for malignant neoplasm of colon: Secondary | ICD-10-CM

## 2013-06-04 DIAGNOSIS — D126 Benign neoplasm of colon, unspecified: Secondary | ICD-10-CM

## 2013-06-04 DIAGNOSIS — I10 Essential (primary) hypertension: Secondary | ICD-10-CM | POA: Diagnosis not present

## 2013-06-04 DIAGNOSIS — Z86010 Personal history of colonic polyps: Secondary | ICD-10-CM | POA: Diagnosis not present

## 2013-06-04 DIAGNOSIS — Z800 Family history of malignant neoplasm of digestive organs: Secondary | ICD-10-CM

## 2013-06-04 MED ORDER — SODIUM CHLORIDE 0.9 % IV SOLN: 500.0000 mL | INTRAVENOUS | Status: DC

## 2013-06-04 NOTE — Patient Instructions (Signed)
Colon polyp x 1 removed today, see handout given on polyps. Repeat colonoscopy in 5 years. Resume current medications. Call us with any questions or concerns. Thank you!!  YOU HAD AN ENDOSCOPIC PROCEDURE TODAY AT THE [facility]: Refer to the procedure report that was given to you for any specific questions about what was found during the examination.  If the procedure report does not answer your questions, please call your gastroenterologist to clarify.  If you requested that your care partner not be given the details of your procedure findings, then the procedure report has been included in a sealed envelope for you to review at your convenience later.  YOU SHOULD EXPECT: Some feelings of bloating in the abdomen. Passage of more gas than usual.  Walking can help get rid of the air that was put into your GI tract during the procedure and reduce the bloating. If you had a lower endoscopy (such as a colonoscopy or flexible sigmoidoscopy) you may notice spotting of blood in your stool or on the toilet paper. If you underwent a bowel prep for your procedure, then you may not have a normal bowel movement for a few days.  DIET: Your first meal following the procedure should be a light meal and then it is ok to progress to your normal diet.  A half-sandwich or bowl of soup is an example of a good first meal.  Heavy or fried foods are harder to digest and may make you feel nauseous or bloated.  Likewise meals heavy in dairy and vegetables can cause extra gas to form and this can also increase the bloating.  Drink plenty of fluids but you should avoid alcoholic beverages for 24 hours.  ACTIVITY: Your care partner should take you home directly after the procedure.  You should plan to take it easy, moving slowly for the rest of the day.  You can resume normal activity the day after the procedure however you should NOT DRIVE or use heavy machinery for 24 hours (because of the sedation medicines used  during the test).    SYMPTOMS TO REPORT IMMEDIATELY: A gastroenterologist can be reached at any hour.  During normal business hours, 8:30 AM to 5:00 PM Monday through Friday, call [phone].  After hours and on weekends, please call the GI answering service at [phone] who will take a message and have the physician on call contact you.   Following lower endoscopy (colonoscopy or flexible sigmoidoscopy):  Excessive amounts of blood in the stool  Significant tenderness or worsening of abdominal pains  Swelling of the abdomen that is new, acute  Fever of 100F or higher  Following upper endoscopy (EGD)  Vomiting of blood or coffee ground material  New chest pain or pain under the shoulder blades  Painful or persistently difficult swallowing  New shortness of breath  Fever of 100F or higher  Black, tarry-looking stools  FOLLOW UP: If any biopsies were taken you will be contacted by phone or by letter within the next 1-3 weeks.  Call your gastroenterologist if you have not heard about the biopsies in 3 weeks.  Our staff will call the home number listed on your records the next business day following your procedure to check on you and address any questions or concerns that you may have at that time regarding the information given to you following your procedure. This is a courtesy call and so if there is no answer at the home number and we have not   heard from you through the emergency physician on call, we will assume that you have returned to your regular daily activities without incident.  SIGNATURES/CONFIDENTIALITY: You and/or your care partner have signed paperwork which will be entered into your electronic medical record.  These signatures attest to the fact that that the information above on your After Visit Summary has been reviewed and is understood.  Full responsibility of the confidentiality of this discharge information lies with you and/or your care-partner. 

## 2013-06-04 NOTE — Progress Notes (Signed)
Procedure ends, to recovery, report given and VSS. 

## 2013-06-04 NOTE — Op Note (Signed)
[facility]  [address] [place], [address]   COLONOSCOPY PROCEDURE REPORT  PATIENT: Marissa Orozco, Marissa Orozco  MR#: [id number] BIRTHDATE: [birth year] , 68  yrs. old GENDER: Female ENDOSCOPIST: Milus Banister, MD PROCEDURE DATE:  06/04/2013 PROCEDURE:   Colonoscopy with snare polypectomy First Screening Colonoscopy - Avg.  risk and is 50 yrs.  old or older - No.  Prior Negative Screening - Now for repeat screening. Above average risk  History of Adenoma - Now for follow-up colonoscopy & has been > or = to 3 yrs.  N/A  Polyps Removed Today? Yes. ASA CLASS:   Class II INDICATIONS:elevated risk screening (mother and father had colon cancer), colonsocopy Dr. Collene Mares 2010 without precancerous polyps MEDICATIONS: MAC sedation, administered by CRNA and propofol (Diprivan) 200mg  IV  DESCRIPTION OF PROCEDURE:   After the risks benefits and alternatives of the procedure were thoroughly explained, informed consent was obtained.  A digital rectal exam revealed no abnormalities of the rectum.   The LB [id number]  endoscope was introduced through the anus and advanced to the cecum, which was identified by both the appendix and ileocecal valve. No adverse events experienced.   The quality of the prep was excellent.  The instrument was then slowly withdrawn as the colon was fully examined.  COLON FINDINGS: One polyp was found, removed and sent to pathology. This was 36mm across, sessile, located in descending segment, removed with cold snare.  The examination was otherwise normal. Retroflexed views revealed no abnormalities. The time to cecum=2 minutes 13 seconds.  Withdrawal time=7 minutes 56 seconds.  The scope was withdrawn and the procedure completed. COMPLICATIONS: There were no complications.  ENDOSCOPIC IMPRESSION: One polyp was found, removed and sent to pathology. The examination was otherwise normal.  RECOMMENDATIONS: Given your significant family history of colon  cancer, you should have a repeat colonoscopy in 5 years even if the polyp remove today are NOT precancerous.  You will receive a letter within 1-2 weeks with the results of your biopsy as well as final recommendations. Please call my office if you have not received a letter after 3 weeks.   eSigned:  Milus Banister, MD 06/04/2013 11:26 AM   cc:   Glendale Chard, MD

## 2013-06-04 NOTE — Progress Notes (Signed)
Called to room to assist during endoscopic procedure.  Patient ID and intended procedure confirmed with present staff. Received instructions for my participation in the procedure from the performing physician.  

## 2013-06-05 ENCOUNTER — Telehealth: Payer: Self-pay | Admitting: *Deleted

## 2013-06-05 NOTE — Telephone Encounter (Signed)
  Follow up Call-  Call back number 06/04/2013  Post procedure Call Back phone  # [phone]  Permission to leave phone message Yes     Patient questions:  Do you have a fever, pain , or abdominal swelling? no Pain Score  0 *  Have you tolerated food without any problems? yes  Have you been able to return to your normal activities? yes  Do you have any questions about your discharge instructions: Diet   no Medications  no Follow up visit  no  Do you have questions or concerns about your Care? no  Actions: * If pain score is 4 or above: No action needed, pain <4.

## 2013-06-08 ENCOUNTER — Encounter: Payer: Self-pay | Admitting: Gastroenterology

## 2013-06-22 ENCOUNTER — Other Ambulatory Visit: Payer: Self-pay

## 2013-06-22 DIAGNOSIS — Z1231 Encounter for screening mammogram for malignant neoplasm of breast: Secondary | ICD-10-CM

## 2013-07-19 DIAGNOSIS — H4011X0 Primary open-angle glaucoma, stage unspecified: Secondary | ICD-10-CM | POA: Diagnosis not present

## 2013-07-30 ENCOUNTER — Ambulatory Visit
Admission: RE | Admit: 2013-07-30 | Discharge: 2013-07-30 | Disposition: A | Discharge status: Home / Self Care | Payer: Medicare Other | Source: Ambulatory Visit

## 2013-07-30 DIAGNOSIS — Z1231 Encounter for screening mammogram for malignant neoplasm of breast: Secondary | ICD-10-CM | POA: Diagnosis not present

## 2013-07-31 ENCOUNTER — Other Ambulatory Visit: Payer: Self-pay | Admitting: Internal Medicine

## 2013-07-31 DIAGNOSIS — R928 Other abnormal and inconclusive findings on diagnostic imaging of breast: Secondary | ICD-10-CM

## 2013-08-06 ENCOUNTER — Ambulatory Visit
Admission: RE | Admit: 2013-08-06 | Discharge: 2013-08-06 | Disposition: A | Discharge status: Home / Self Care | Payer: Medicare Other | Source: Ambulatory Visit | Attending: Internal Medicine | Admitting: Internal Medicine

## 2013-08-06 DIAGNOSIS — R928 Other abnormal and inconclusive findings on diagnostic imaging of breast: Secondary | ICD-10-CM

## 2013-08-28 DIAGNOSIS — M810 Age-related osteoporosis without current pathological fracture: Secondary | ICD-10-CM | POA: Diagnosis not present

## 2013-10-03 DIAGNOSIS — M810 Age-related osteoporosis without current pathological fracture: Secondary | ICD-10-CM | POA: Diagnosis not present

## 2013-10-18 DIAGNOSIS — R5381 Other malaise: Secondary | ICD-10-CM | POA: Diagnosis not present

## 2013-10-18 DIAGNOSIS — H4011X1 Primary open-angle glaucoma, mild stage: Secondary | ICD-10-CM | POA: Diagnosis not present

## 2013-11-07 DIAGNOSIS — L659 Nonscarring hair loss, unspecified: Secondary | ICD-10-CM | POA: Diagnosis not present

## 2013-11-07 DIAGNOSIS — L720 Epidermal cyst: Secondary | ICD-10-CM | POA: Diagnosis not present

## 2013-11-22 DIAGNOSIS — I10 Essential (primary) hypertension: Secondary | ICD-10-CM | POA: Diagnosis not present

## 2013-11-22 DIAGNOSIS — R7309 Other abnormal glucose: Secondary | ICD-10-CM | POA: Diagnosis not present

## 2013-11-22 DIAGNOSIS — Z0000 Encounter for general adult medical examination without abnormal findings: Secondary | ICD-10-CM | POA: Diagnosis not present

## 2013-11-22 DIAGNOSIS — E785 Hyperlipidemia, unspecified: Secondary | ICD-10-CM | POA: Diagnosis not present

## 2013-11-22 DIAGNOSIS — M810 Age-related osteoporosis without current pathological fracture: Secondary | ICD-10-CM | POA: Diagnosis not present

## 2013-11-22 DIAGNOSIS — I129 Hypertensive chronic kidney disease with stage 1 through stage 4 chronic kidney disease, or unspecified chronic kidney disease: Secondary | ICD-10-CM | POA: Diagnosis not present

## 2013-11-22 DIAGNOSIS — E559 Vitamin D deficiency, unspecified: Secondary | ICD-10-CM | POA: Diagnosis not present

## 2014-01-09 ENCOUNTER — Other Ambulatory Visit: Payer: Self-pay | Admitting: Internal Medicine

## 2014-01-09 DIAGNOSIS — N6310 Unspecified lump in the right breast, unspecified quadrant: Secondary | ICD-10-CM

## 2014-01-15 DIAGNOSIS — H4011X1 Primary open-angle glaucoma, mild stage: Secondary | ICD-10-CM | POA: Diagnosis not present

## 2014-01-15 DIAGNOSIS — H2513 Age-related nuclear cataract, bilateral: Secondary | ICD-10-CM | POA: Diagnosis not present

## 2014-02-07 ENCOUNTER — Ambulatory Visit
Admission: RE | Admit: 2014-02-07 | Discharge: 2014-02-07 | Disposition: A | Discharge status: Home / Self Care | Payer: Medicare Other | Source: Ambulatory Visit | Attending: Internal Medicine | Admitting: Internal Medicine

## 2014-02-07 ENCOUNTER — Other Ambulatory Visit: Payer: Self-pay | Admitting: Internal Medicine

## 2014-02-07 DIAGNOSIS — N6310 Unspecified lump in the right breast, unspecified quadrant: Secondary | ICD-10-CM

## 2014-02-07 DIAGNOSIS — N63 Unspecified lump in breast: Secondary | ICD-10-CM | POA: Diagnosis not present

## 2014-04-01 ENCOUNTER — Ambulatory Visit (INDEPENDENT_AMBULATORY_CARE_PROVIDER_SITE_OTHER): Payer: Medicare Other | Admitting: Internal Medicine

## 2014-04-01 ENCOUNTER — Encounter: Payer: Self-pay | Admitting: Internal Medicine

## 2014-04-01 VITALS — BP 118/70 | HR 70 | Ht 63.0 in | Wt 152.2 lb

## 2014-04-01 DIAGNOSIS — R918 Other nonspecific abnormal finding of lung field: Secondary | ICD-10-CM | POA: Diagnosis not present

## 2014-04-01 NOTE — Progress Notes (Signed)
11/19/11-  48 yoF never smoker referred courtesy of Dr Glendale Chard, concerned about lung nodules seen on cardiac CT. She does not get the flu shot. Lung nodules were new discovery at the time of a cardiac CT being done to evaluate chest pain 09/08/2011. She has no history of lung disease except for pneumonia 8 years ago treated as an outpatient. She has not lived in areas of the country endemic for nodular fungus infection and has no history of TB exposure. She is married to a smoker-discussed secondhand exposure risk. She denies sweats, fevers or swollen glands. She often coughs during meals but otherwise does not recognize chronic cough. Her personal main concern is halitosis which has been reported to her by "people". She has had negative workups by dentist, gastroenterologist, ENT. She has no history of sinus disease. Empiric trial of antibiotic made no difference. Failed saline nasal rinse. CT Cor Morph  09/08/11-reviewed with her IMPRESSION:  1. Mild nonobstructive coronary artery disease, as detailed above.  The patient's total coronary artery calcium score is 173 which is  88th percentile for patients of matched age, gender and  race/ethnicity.  2. Multiple pulmonary nodules scattered throughout the lungs  bilaterally. The largest of these is 8 mm in the left lower lobe.  If the patient is at high risk for bronchogenic carcinoma, follow-  up chest CT at 3-6 months is recommended. If the patient is at low  risk for bronchogenic carcinoma, follow-up chest CT at 6-12 months  is recommended. This recommendation follows the consensus  statement: Guidelines for Management of Small Pulmonary Nodules  Detected on CT Scans: A Statement from the [place] as  published in Radiology 2005; 237:395-400.  3. Codominance of the coronary arteries.  Report was called to CDU mid level at [phone] at 10:30 AM on  09/08/2011.    03/24/12- 66 yoF never smoker referred courtesy of Dr Glendale Chard, concerned about lung nodules seen on cardiac CT FOLLOWS FOR: denies any wheezing, SOB, cough, or congestion. PFT: 11/22/2011-within normal-FVC 2.66/92%, FEV1 2.21/106%, FEV1/FVC 0.83, TLC 92%, DLCO 82% She has been doing well. Quantiferon TB gold- negative 11/18  /13 CT chest 03/22/12- radiology report and images reviewed with her IMPRESSION:  Unchanged pulmonary nodules compared to 09/08/2011. The largest is  in the left lower lobe measuring 8 mm (image number 39 series 3).  This study constitutes 19-month follow-up. If the patient is low  risk, follow-up in 12-18 months is recommended. If patient is high  risk, next follow-up in 6 months recommended. This recommendation  follows the consensus statement: Guidelines for Management of Small  Pulmonary Nodules Detected on CT Scans: A Statement from the  [place] as published in Radiology 2005; 237:395-400.  Original Report Authenticated By: Dereck Ligas, M.D.  03/27/13- 22 yoF never smoker referred courtesy of Dr Glendale Chard, concerned about lung nodules seen on cardiac CT FOLLOWS FOR: denies any SOB, wheezing, cough, or congestion since last visit. Married to a smoker. Feels well  03/31/14- 69 yoF never smoker referred courtesy of Dr Glendale Chard, concerned about lung nodules seen on cardiac CT FOLLOWS FOR  denies SOB, wheezing, cough or congestion  Quant TB gold was negative 2013 Husband smoking E cigarettes CT chest 04/02/13-images reviewed with her IMPRESSION: Pulmonary nodules stable for 18 months. Findings again most consistent with granulomatous disease. If the patient is not considered at high risk for bronchogenic carcinoma, followup may be considered complete. If the patient were considered to be at high  risk, follow-up in 6 months to document 24 month stability would be suggested. This recommendation follows the consensus statement: Guidelines for Management of Small Pulmonary Nodules Detected on  CT Scans: A Statement from the [place] as published in Radiology 2005; 237:395-400. Electronically Signed  By: Skipper Cliche M.D.  On: 04/02/2013 13:56  ROS-see HPI Constitutional:   No-   weight loss, night sweats, fevers, chills, fatigue, lassitude. HEENT:   No-  headaches, difficulty swallowing, tooth/dental problems, sore throat,       No-  sneezing, itching, ear ache, nasal congestion, post nasal drip,  CV:  No-chest pain, orthopnea, PND, swelling in lower extremities, anasarca, dizziness, palpitations Resp: No-   shortness of breath with exertion or at rest.              No-   productive cough,  No non-productive cough,  No- coughing up of blood.              No-   change in color of mucus.  No- wheezing.   Skin: No-   rash or lesions. GI:  No-   heartburn, indigestion, abdominal pain, nausea, vomiting,  GU:  MS:  No-   joint pain or swelling.   Neuro-     nothing unusual Psych:  No- change in mood or affect. No depression or anxiety.  No memory loss.  OBJ- Physical Exam General- Alert, Oriented, Affect-appropriate, Distress- none acute. No evidence halitosis Skin- rash-none, lesions- none, excoriation- none Lymphadenopathy- none Head- atraumatic            Eyes- Gross vision intact, PERRLA, conjunctivae and secretions clear            Ears- Hearing, canals-normal            Nose- Clear, no-Septal dev, mucus, polyps, erosion, perforation             Throat- Mallampati II , mucosa clear , drainage- none, tonsils- atrophic Neck- flexible , trachea midline, no stridor , thyroid nl, carotid no bruit Chest - symmetrical excursion , unlabored           Heart/CV- RRR , no murmur , no gallop  , no rub, nl s1 s2                           - JVD- none , edema- none, stasis changes- none, varices- none           Lung- clear to P&A, wheeze- none, cough- none , dullness-none, rub- none           Chest wall-  Abd-  Br/ Gen/ Rectal- Not done, not indicated Extrem-  cyanosis- none, clubbing, none, atrophy- none, strength- nl Neuro- grossly intact to observation

## 2014-04-01 NOTE — Patient Instructions (Signed)
The lung nodules seen on CT are very small and look benign. Your primary doctor can get a chest xray every few years, to notice if anything gets big enough to see.  Please call if we can help.

## 2014-04-02 NOTE — Assessment & Plan Note (Signed)
Old granulomatous disease, clinically and radiologically stable. These meet criteria to be considered benign. I don't think she needs anything other than an occasional chest x-ray for surveillance. We will be happy to see her again as needed.

## 2014-04-12 DIAGNOSIS — M810 Age-related osteoporosis without current pathological fracture: Secondary | ICD-10-CM | POA: Diagnosis not present

## 2014-04-18 DIAGNOSIS — H4011X1 Primary open-angle glaucoma, mild stage: Secondary | ICD-10-CM | POA: Diagnosis not present

## 2014-04-29 DIAGNOSIS — R5381 Other malaise: Secondary | ICD-10-CM | POA: Diagnosis not present

## 2014-05-27 DIAGNOSIS — E780 Pure hypercholesterolemia: Secondary | ICD-10-CM | POA: Diagnosis not present

## 2014-05-27 DIAGNOSIS — I10 Essential (primary) hypertension: Secondary | ICD-10-CM | POA: Diagnosis not present

## 2014-05-27 DIAGNOSIS — R7309 Other abnormal glucose: Secondary | ICD-10-CM | POA: Diagnosis not present

## 2014-05-27 DIAGNOSIS — Z79899 Other long term (current) drug therapy: Secondary | ICD-10-CM | POA: Diagnosis not present

## 2014-05-27 DIAGNOSIS — Y93I9 Activity, other involving external motion: Secondary | ICD-10-CM | POA: Diagnosis not present

## 2014-07-01 ENCOUNTER — Other Ambulatory Visit: Payer: Self-pay

## 2014-07-12 ENCOUNTER — Other Ambulatory Visit: Payer: Self-pay | Admitting: Internal Medicine

## 2014-07-12 DIAGNOSIS — N6310 Unspecified lump in the right breast, unspecified quadrant: Secondary | ICD-10-CM

## 2014-07-23 DIAGNOSIS — H4011X1 Primary open-angle glaucoma, mild stage: Secondary | ICD-10-CM | POA: Diagnosis not present

## 2014-08-22 ENCOUNTER — Other Ambulatory Visit: Payer: Medicare Other

## 2014-08-30 ENCOUNTER — Ambulatory Visit
Admission: RE | Admit: 2014-08-30 | Discharge: 2014-08-30 | Disposition: A | Discharge status: Home / Self Care | Payer: Medicare Other | Source: Ambulatory Visit | Attending: Internal Medicine | Admitting: Internal Medicine

## 2014-08-30 DIAGNOSIS — N6310 Unspecified lump in the right breast, unspecified quadrant: Secondary | ICD-10-CM

## 2014-08-30 DIAGNOSIS — N63 Unspecified lump in breast: Secondary | ICD-10-CM | POA: Diagnosis not present

## 2014-10-22 DIAGNOSIS — Z79899 Other long term (current) drug therapy: Secondary | ICD-10-CM | POA: Diagnosis not present

## 2014-10-23 DIAGNOSIS — H2513 Age-related nuclear cataract, bilateral: Secondary | ICD-10-CM | POA: Diagnosis not present

## 2014-10-23 DIAGNOSIS — H401131 Primary open-angle glaucoma, bilateral, mild stage: Secondary | ICD-10-CM | POA: Diagnosis not present

## 2014-11-20 DIAGNOSIS — M810 Age-related osteoporosis without current pathological fracture: Secondary | ICD-10-CM | POA: Diagnosis not present

## 2014-11-26 DIAGNOSIS — Z131 Encounter for screening for diabetes mellitus: Secondary | ICD-10-CM | POA: Diagnosis not present

## 2014-11-26 DIAGNOSIS — E559 Vitamin D deficiency, unspecified: Secondary | ICD-10-CM | POA: Diagnosis not present

## 2014-11-26 DIAGNOSIS — Z0000 Encounter for general adult medical examination without abnormal findings: Secondary | ICD-10-CM | POA: Diagnosis not present

## 2014-11-26 DIAGNOSIS — E785 Hyperlipidemia, unspecified: Secondary | ICD-10-CM | POA: Diagnosis not present

## 2014-11-26 DIAGNOSIS — I10 Essential (primary) hypertension: Secondary | ICD-10-CM | POA: Diagnosis not present

## 2014-11-26 DIAGNOSIS — R7309 Other abnormal glucose: Secondary | ICD-10-CM | POA: Diagnosis not present

## 2014-12-02 DIAGNOSIS — Z79899 Other long term (current) drug therapy: Secondary | ICD-10-CM | POA: Diagnosis not present

## 2015-01-22 DIAGNOSIS — H401131 Primary open-angle glaucoma, bilateral, mild stage: Secondary | ICD-10-CM | POA: Diagnosis not present

## 2015-02-03 DIAGNOSIS — L720 Epidermal cyst: Secondary | ICD-10-CM | POA: Diagnosis not present

## 2015-02-03 DIAGNOSIS — L659 Nonscarring hair loss, unspecified: Secondary | ICD-10-CM | POA: Diagnosis not present

## 2015-02-17 DIAGNOSIS — I10 Essential (primary) hypertension: Secondary | ICD-10-CM | POA: Diagnosis not present

## 2015-02-17 DIAGNOSIS — R7309 Other abnormal glucose: Secondary | ICD-10-CM | POA: Diagnosis not present

## 2015-02-17 DIAGNOSIS — Z79899 Other long term (current) drug therapy: Secondary | ICD-10-CM | POA: Diagnosis not present

## 2015-02-17 DIAGNOSIS — E785 Hyperlipidemia, unspecified: Secondary | ICD-10-CM | POA: Diagnosis not present

## 2015-02-17 DIAGNOSIS — Z13820 Encounter for screening for osteoporosis: Secondary | ICD-10-CM | POA: Diagnosis not present

## 2015-03-11 DIAGNOSIS — M810 Age-related osteoporosis without current pathological fracture: Secondary | ICD-10-CM | POA: Diagnosis not present

## 2015-03-11 DIAGNOSIS — M19271 Secondary osteoarthritis, right ankle and foot: Secondary | ICD-10-CM | POA: Diagnosis not present

## 2015-03-11 DIAGNOSIS — M79672 Pain in left foot: Secondary | ICD-10-CM | POA: Diagnosis not present

## 2015-03-11 DIAGNOSIS — M7071 Other bursitis of hip, right hip: Secondary | ICD-10-CM | POA: Diagnosis not present

## 2015-03-26 DIAGNOSIS — L720 Epidermal cyst: Secondary | ICD-10-CM | POA: Diagnosis not present

## 2015-03-26 DIAGNOSIS — L649 Androgenic alopecia, unspecified: Secondary | ICD-10-CM | POA: Diagnosis not present

## 2015-03-31 DIAGNOSIS — Z79899 Other long term (current) drug therapy: Secondary | ICD-10-CM | POA: Diagnosis not present

## 2015-03-31 DIAGNOSIS — E7800 Pure hypercholesterolemia, unspecified: Secondary | ICD-10-CM | POA: Diagnosis not present

## 2015-04-02 DIAGNOSIS — Z872 Personal history of diseases of the skin and subcutaneous tissue: Secondary | ICD-10-CM | POA: Diagnosis not present

## 2015-04-02 DIAGNOSIS — Z48817 Encounter for surgical aftercare following surgery on the skin and subcutaneous tissue: Secondary | ICD-10-CM | POA: Diagnosis not present

## 2015-04-16 DIAGNOSIS — H401131 Primary open-angle glaucoma, bilateral, mild stage: Secondary | ICD-10-CM | POA: Diagnosis not present

## 2015-05-26 ENCOUNTER — Other Ambulatory Visit (HOSPITAL_COMMUNITY): Payer: Self-pay | Admitting: *Deleted

## 2015-05-27 ENCOUNTER — Ambulatory Visit (HOSPITAL_COMMUNITY)
Admission: RE | Admit: 2015-05-27 | Discharge: 2015-05-27 | Disposition: A | Discharge status: Home / Self Care | Payer: Medicare Other | Source: Ambulatory Visit | Attending: Rheumatology | Admitting: Rheumatology

## 2015-05-27 DIAGNOSIS — M810 Age-related osteoporosis without current pathological fracture: Secondary | ICD-10-CM | POA: Insufficient documentation

## 2015-05-27 MED ORDER — DENOSUMAB 60 MG/ML ~~LOC~~ SOLN
60.0000 mg | Freq: Once | SUBCUTANEOUS | Status: AC
  Administered 2015-05-27: 60 mg via SUBCUTANEOUS
  Filled 2015-05-27: qty 1

## 2015-06-06 DIAGNOSIS — Z79899 Other long term (current) drug therapy: Secondary | ICD-10-CM | POA: Diagnosis not present

## 2015-06-27 DIAGNOSIS — L821 Other seborrheic keratosis: Secondary | ICD-10-CM | POA: Diagnosis not present

## 2015-06-27 DIAGNOSIS — L304 Erythema intertrigo: Secondary | ICD-10-CM | POA: Diagnosis not present

## 2015-07-16 DIAGNOSIS — H401131 Primary open-angle glaucoma, bilateral, mild stage: Secondary | ICD-10-CM | POA: Diagnosis not present

## 2015-07-30 ENCOUNTER — Other Ambulatory Visit: Payer: Self-pay | Admitting: Internal Medicine

## 2015-07-30 DIAGNOSIS — N63 Unspecified lump in unspecified breast: Secondary | ICD-10-CM

## 2015-08-18 DIAGNOSIS — I10 Essential (primary) hypertension: Secondary | ICD-10-CM | POA: Diagnosis not present

## 2015-08-18 DIAGNOSIS — R7309 Other abnormal glucose: Secondary | ICD-10-CM | POA: Diagnosis not present

## 2015-08-18 DIAGNOSIS — Z79899 Other long term (current) drug therapy: Secondary | ICD-10-CM | POA: Diagnosis not present

## 2015-08-18 DIAGNOSIS — E785 Hyperlipidemia, unspecified: Secondary | ICD-10-CM | POA: Diagnosis not present

## 2015-09-01 ENCOUNTER — Ambulatory Visit
Admission: RE | Admit: 2015-09-01 | Discharge: 2015-09-01 | Disposition: A | Discharge status: Home / Self Care | Payer: 59 | Source: Ambulatory Visit | Attending: Internal Medicine | Admitting: Internal Medicine

## 2015-09-01 ENCOUNTER — Ambulatory Visit
Admission: RE | Admit: 2015-09-01 | Discharge: 2015-09-01 | Disposition: A | Discharge status: Home / Self Care | Payer: Medicare Other | Source: Ambulatory Visit | Attending: Internal Medicine | Admitting: Internal Medicine

## 2015-09-01 DIAGNOSIS — N63 Unspecified lump in unspecified breast: Secondary | ICD-10-CM

## 2015-09-29 DIAGNOSIS — Z79899 Other long term (current) drug therapy: Secondary | ICD-10-CM | POA: Diagnosis not present

## 2015-09-29 DIAGNOSIS — M791 Myalgia: Secondary | ICD-10-CM | POA: Diagnosis not present

## 2015-09-29 DIAGNOSIS — I10 Essential (primary) hypertension: Secondary | ICD-10-CM | POA: Diagnosis not present

## 2015-09-29 DIAGNOSIS — E785 Hyperlipidemia, unspecified: Secondary | ICD-10-CM | POA: Diagnosis not present

## 2015-10-22 DIAGNOSIS — H401131 Primary open-angle glaucoma, bilateral, mild stage: Secondary | ICD-10-CM | POA: Diagnosis not present

## 2016-01-28 DIAGNOSIS — H401131 Primary open-angle glaucoma, bilateral, mild stage: Secondary | ICD-10-CM | POA: Diagnosis not present

## 2016-02-13 DIAGNOSIS — I10 Essential (primary) hypertension: Secondary | ICD-10-CM | POA: Diagnosis not present

## 2016-04-28 DIAGNOSIS — H401131 Primary open-angle glaucoma, bilateral, mild stage: Secondary | ICD-10-CM | POA: Diagnosis not present

## 2016-05-06 DIAGNOSIS — E559 Vitamin D deficiency, unspecified: Secondary | ICD-10-CM | POA: Diagnosis not present

## 2016-05-06 DIAGNOSIS — Z1231 Encounter for screening mammogram for malignant neoplasm of breast: Secondary | ICD-10-CM | POA: Diagnosis not present

## 2016-05-06 DIAGNOSIS — I10 Essential (primary) hypertension: Secondary | ICD-10-CM | POA: Diagnosis not present

## 2016-05-06 DIAGNOSIS — E785 Hyperlipidemia, unspecified: Secondary | ICD-10-CM | POA: Diagnosis not present

## 2016-07-21 ENCOUNTER — Other Ambulatory Visit: Payer: Self-pay | Admitting: Internal Medicine

## 2016-07-21 DIAGNOSIS — Z1231 Encounter for screening mammogram for malignant neoplasm of breast: Secondary | ICD-10-CM

## 2016-07-26 ENCOUNTER — Other Ambulatory Visit: Payer: Self-pay

## 2016-07-28 DIAGNOSIS — H401131 Primary open-angle glaucoma, bilateral, mild stage: Secondary | ICD-10-CM | POA: Diagnosis not present

## 2016-08-11 DIAGNOSIS — R7309 Other abnormal glucose: Secondary | ICD-10-CM | POA: Diagnosis not present

## 2016-08-11 DIAGNOSIS — Z6827 Body mass index (BMI) 27.0-27.9, adult: Secondary | ICD-10-CM | POA: Diagnosis not present

## 2016-08-11 DIAGNOSIS — E785 Hyperlipidemia, unspecified: Secondary | ICD-10-CM | POA: Diagnosis not present

## 2016-08-11 DIAGNOSIS — I10 Essential (primary) hypertension: Secondary | ICD-10-CM | POA: Diagnosis not present

## 2016-08-25 DIAGNOSIS — R799 Abnormal finding of blood chemistry, unspecified: Secondary | ICD-10-CM | POA: Diagnosis not present

## 2016-09-02 ENCOUNTER — Ambulatory Visit
Admission: RE | Admit: 2016-09-02 | Discharge: 2016-09-02 | Disposition: A | Discharge status: Home / Self Care | Payer: Medicare Other | Source: Ambulatory Visit | Attending: Internal Medicine | Admitting: Internal Medicine

## 2016-09-02 DIAGNOSIS — Z1231 Encounter for screening mammogram for malignant neoplasm of breast: Secondary | ICD-10-CM

## 2016-10-27 DIAGNOSIS — H401131 Primary open-angle glaucoma, bilateral, mild stage: Secondary | ICD-10-CM | POA: Diagnosis not present

## 2017-01-26 DIAGNOSIS — H401131 Primary open-angle glaucoma, bilateral, mild stage: Secondary | ICD-10-CM | POA: Diagnosis not present

## 2017-03-25 DIAGNOSIS — I10 Essential (primary) hypertension: Secondary | ICD-10-CM | POA: Diagnosis not present

## 2017-03-25 DIAGNOSIS — Z800 Family history of malignant neoplasm of digestive organs: Secondary | ICD-10-CM | POA: Diagnosis not present

## 2017-03-25 DIAGNOSIS — K921 Melena: Secondary | ICD-10-CM | POA: Diagnosis not present

## 2017-04-05 ENCOUNTER — Encounter: Payer: Self-pay | Admitting: Emergency Medicine

## 2017-04-06 ENCOUNTER — Encounter: Payer: Self-pay | Admitting: Physician Assistant

## 2017-04-06 ENCOUNTER — Ambulatory Visit (INDEPENDENT_AMBULATORY_CARE_PROVIDER_SITE_OTHER): Payer: Medicare Other | Admitting: Physician Assistant

## 2017-04-06 VITALS — BP 132/64 | HR 76 | Ht 63.0 in | Wt 156.5 lb

## 2017-04-06 DIAGNOSIS — K625 Hemorrhage of anus and rectum: Secondary | ICD-10-CM | POA: Diagnosis not present

## 2017-04-06 DIAGNOSIS — Z86010 Personal history of colonic polyps: Secondary | ICD-10-CM

## 2017-04-06 DIAGNOSIS — Z800 Family history of malignant neoplasm of digestive organs: Secondary | ICD-10-CM | POA: Diagnosis not present

## 2017-04-06 MED ORDER — PEG 3350-KCL-NA BICARB-NACL 420 G PO SOLR: 4000.0000 mL | Freq: Once | ORAL | 0 refills | Status: AC

## 2017-04-06 NOTE — Progress Notes (Signed)
Chief Complaint: Blood in stool  Review of pertinent gastrointestinal problems: 1.  Family history of colon cancer (both parents): Colonoscopy 05/2008 with Dr. Collene Mares done for "personal history of polyps, FH of colon cancer (mother & father)" Two "small cecal polyps" that were not adenomas by pathology; otherwise normal examination. She was recommended to have repeat colonoscopy at 3 year interval for unclear reasons. 2.  Halitosis led to EGD done 2012 was found small HH only, was referred to rheum due to esophagram suggesting scleroderma. Rheum said she does NOT have scleroderma. 3. 06/04/13 colonoscopy with one polyp.  It was recommended given patient's significant family history of colon cancer, repeat in 5 years even if polyp was not precancerous.  Pathology was adenomatous.Marland Kitchen  HPI:    Marissa Orozco is a 72 year old African-American female with a past medical history as listed below, who follows with Dr. Ardis Hughs and was referred to me by Glendale Chard, MD for a complaint of blood in her stool.      Labs 03/25/17 show a normal CBC.    Today, explains that 2 weeks ago she had a bowel movement which left a pink tinge on the toilet paper after she wiped one day and the next day she did see some bright red blood after wiping from her BM.  This was contained only to the toilet paper and she has had no further since.  Did see her PCP who did a CBC which was normal as well as a rectal exam which she was told was normal and did Hemoccult studies which were negative.  Patient denies any other concerning symptoms but tells me due to her family history this has made her nervous.    Denies fever, chills, change in bowel habits, abdominal pain, rectal pain, recent straining, constipation, diarrhea, change in medications, weight loss or symptoms that awaken her at night.  Past Medical History:  Diagnosis Date  . Breast lump   . Colon polyps   . Fibroids   . GERD (gastroesophageal reflux disease)   . Glaucoma   .  Hyperlipidemia 1986  . Hypertension 1986  . Interstitial cystitis 1999  . Kidney stones 2005  . Osteoporosis   . Tonsillitis     Past Surgical History:  Procedure Laterality Date  . BREAST EXCISIONAL BIOPSY Right 2013?   Benign  . BREAST LUMPECTOMY Right 2008   right breast; benign  . COLONOSCOPY  2010   Dr Collene Mares  . HYSTEROSCOPY  1993  . LAPAROSCOPIC HYSTERECTOMY  1994  . MYOMECTOMY  1993   excision of tumor  . POLYPECTOMY    . TONSILLECTOMY  1953    Current Outpatient Medications  Medication Sig Dispense Refill  . CALCIUM PO Take 1 tablet by mouth daily.    . Cholecalciferol (VITAMIN D PO) Take 1 tablet by mouth daily.    . finasteride (PROSCAR) 5 MG tablet Take 2.5 mg by mouth daily.      Marland Kitchen latanoprost (XALATAN) 0.005 % ophthalmic solution Place 1 drop into both eyes daily.    Marland Kitchen lisinopril (PRINIVIL,ZESTRIL) 5 MG tablet Take 5 mg by mouth daily.      . Multiple Vitamin (MULTIVITAMIN WITH MINERALS) TABS Take 1 tablet by mouth daily.    Marland Kitchen PROLIA 60 MG/ML SOLN injection Every 6 months    . rosuvastatin (CRESTOR) 10 MG tablet Take 10 mg by mouth daily.     No current facility-administered medications for this visit.     Allergies as of 04/06/2017 -  Review Complete 05/27/2015  Allergen Reaction Noted  . Hctz [hydrochlorothiazide] Itching 05/29/2010    Family History  Problem Relation Age of Onset  . Coronary artery disease Father        mother, PGM  . Stroke Father   . Hypertension Father   . Colon cancer Father 54  . Hypertension Mother   . Heart disease Mother   . Colon cancer Mother 78  . Diabetes Unknown   . Colon cancer Maternal Grandmother     Social History   Socioeconomic History  . Marital status: Married    Spouse name: Not on file  . Number of children: 2  . Years of education: Not on file  . Highest education level: Not on file  Occupational History  . Occupation: retired  Scientific laboratory technician  . Financial resource strain: Not on file  . Food  insecurity:    Worry: Not on file    Inability: Not on file  . Transportation needs:    Medical: Not on file    Non-medical: Not on file  Tobacco Use  . Smoking status: Never Smoker  . Smokeless tobacco: Never Used  Substance and Sexual Activity  . Alcohol use: Yes    Alcohol/week: 4.2 oz    Types: 7 Glasses of wine per week  . Drug use: No  . Sexual activity: Not on file  Lifestyle  . Physical activity:    Days per week: Not on file    Minutes per session: Not on file  . Stress: Not on file  Relationships  . Social connections:    Talks on phone: Not on file    Gets together: Not on file    Attends religious service: Not on file    Active member of club or organization: Not on file    Attends meetings of clubs or organizations: Not on file    Relationship status: Not on file  . Intimate partner violence:    Fear of current or ex partner: Not on file    Emotionally abused: Not on file    Physically abused: Not on file    Forced sexual activity: Not on file  Other Topics Concern  . Not on file  Social History Narrative  . Not on file    Review of Systems:    Constitutional: No weight loss, fever or chills Skin: No rash  Cardiovascular: No chest pain Respiratory: No SOB  Gastrointestinal: See HPI and otherwise negative Genitourinary: No dysuria Neurological: No headache, dizziness or syncope Musculoskeletal: No new muscle or joint pain Hematologic: No bruising Psychiatric: No history of depression or anxiety   Physical Exam:  Vital signs: BP 132/64   Pulse 76   Ht 5\' 3"  (1.6 m)   Wt 156 lb 8 oz (71 kg)   BMI 27.72 kg/m    Constitutional:   Pleasant AAfemale appears to be in NAD, Well developed, Well nourished, alert and cooperative Head:  Normocephalic and atraumatic. Eyes:   PEERL, EOMI. No icterus. Conjunctiva pink. Ears:  Normal auditory acuity. Neck:  Supple Throat: Oral cavity and pharynx without inflammation, swelling or lesion.  Respiratory:  Respirations even and unlabored. Lungs clear to auscultation bilaterally.   No wheezes, crackles, or rhonchi.  Cardiovascular: Normal S1, S2. No MRG. Regular rate and rhythm. No peripheral edema, cyanosis or pallor.  Gastrointestinal:  Soft, nondistended, nontender. No rebound or guarding. Normal bowel sounds. No appreciable masses or hepatomegaly. Rectal:  Declined Msk:  Symmetrical without gross deformities. Without  edema, no deformity or joint abnormality.  Neurologic:  Alert and  oriented x4;  grossly normal neurologically.  Skin:   Dry and intact without significant lesions or rashes. Psychiatric:  Demonstrates good judgement and reason without abnormal affect or behaviors.  See HPI for recent labs  Assessment: 1.  Rectal bleeding: 2 weeks ago, 2 days of rectal bleeding, contained to the toilet paper, no straining or change in bowel habits, none since; most likely hemorrhoids, though with family history patient is worried and anxious 2.  Family history of colon cancer: In her grandmother, mother and father 3.  Personal history of adenomatous polyps: Last colonoscopy 2015 with recommendations for repeat in 5 years due to family history and adenomatous polyp  Plan: 1.  Scheduled patient for a diagnostic colonoscopy in [place] with Dr. Ardis Hughs.  Did discuss risk, benefits, limitations and alternatives and the patient agrees to proceed. 2.  Patient advised to call us if she has any further bleeding prior to time of procedure. 3.  Patient will follow in clinic per recommendations from Dr. Ardis Hughs after time of procedure.  Ellouise Newer, PA-C [facility] 04/06/2017, 1:41 PM  Cc: Glendale Chard, MD

## 2017-04-06 NOTE — Patient Instructions (Addendum)
If you are age 74 or older, your body mass index should be between 23-30. Your Body mass index is 27.72 kg/m. If this is out of the aforementioned range listed, please consider follow up with your Primary Care Provider.  If you are age 27 or younger, your body mass index should be between 19-25. Your Body mass index is 27.72 kg/m. If this is out of the aformentioned range listed, please consider follow up with your Primary Care Provider.   You have been scheduled for a colonoscopy. Please follow written instructions given to you at your visit today.  Please pick up your prep supplies at the pharmacy within the next 1-3 days. If you use inhalers (even only as needed), please bring them with you on the day of your procedure. Your physician has requested that you go to www.startemmi.com and enter the access code given to you at your visit today. This web site gives a general overview about your procedure. However, you should still follow specific instructions given to you by our office regarding your preparation for the procedure.

## 2017-04-07 DIAGNOSIS — Z79899 Other long term (current) drug therapy: Secondary | ICD-10-CM | POA: Diagnosis not present

## 2017-04-07 DIAGNOSIS — I10 Essential (primary) hypertension: Secondary | ICD-10-CM | POA: Diagnosis not present

## 2017-04-07 DIAGNOSIS — E2839 Other primary ovarian failure: Secondary | ICD-10-CM | POA: Diagnosis not present

## 2017-04-07 DIAGNOSIS — R7309 Other abnormal glucose: Secondary | ICD-10-CM | POA: Diagnosis not present

## 2017-04-07 DIAGNOSIS — E785 Hyperlipidemia, unspecified: Secondary | ICD-10-CM | POA: Diagnosis not present

## 2017-04-07 DIAGNOSIS — R1310 Dysphagia, unspecified: Secondary | ICD-10-CM | POA: Diagnosis not present

## 2017-04-07 LAB — BASIC METABOLIC PANEL
BUN: 14 (ref 4–21)
Creatinine: 0.8 (ref 0.5–1.1)
Glucose: 91
Potassium: 4.1 (ref 3.4–5.3)
Sodium: 142 (ref 137–147)

## 2017-04-07 LAB — LIPID PANEL
Cholesterol: 205 — AB (ref 0–200)
HDL: 57 (ref 35–70)
LDL Cholesterol: 129
LDl/HDL Ratio: 2.3
Triglycerides: 94 (ref 40–160)

## 2017-04-07 LAB — HEMOGLOBIN A1C: Hemoglobin A1C: 6

## 2017-04-07 LAB — HEPATIC FUNCTION PANEL
ALT: 13 (ref 7–35)
AST: 17 (ref 13–35)
Alkaline Phosphatase: 91 (ref 25–125)

## 2017-04-07 NOTE — Progress Notes (Signed)
I agree with the above note, plan 

## 2017-04-08 ENCOUNTER — Other Ambulatory Visit (HOSPITAL_COMMUNITY): Payer: Self-pay | Admitting: Internal Medicine

## 2017-04-08 DIAGNOSIS — R1310 Dysphagia, unspecified: Secondary | ICD-10-CM

## 2017-04-11 ENCOUNTER — Other Ambulatory Visit: Payer: Self-pay | Admitting: Internal Medicine

## 2017-04-11 DIAGNOSIS — Z139 Encounter for screening, unspecified: Secondary | ICD-10-CM

## 2017-04-11 DIAGNOSIS — E2839 Other primary ovarian failure: Secondary | ICD-10-CM

## 2017-04-20 ENCOUNTER — Ambulatory Visit (HOSPITAL_COMMUNITY)
Admission: RE | Admit: 2017-04-20 | Discharge: 2017-04-20 | Disposition: A | Discharge status: Home / Self Care | Payer: Medicare Other | Source: Ambulatory Visit | Attending: Internal Medicine | Admitting: Internal Medicine

## 2017-04-20 DIAGNOSIS — K219 Gastro-esophageal reflux disease without esophagitis: Secondary | ICD-10-CM | POA: Insufficient documentation

## 2017-04-20 DIAGNOSIS — R1310 Dysphagia, unspecified: Secondary | ICD-10-CM | POA: Diagnosis not present

## 2017-04-20 DIAGNOSIS — K449 Diaphragmatic hernia without obstruction or gangrene: Secondary | ICD-10-CM | POA: Diagnosis not present

## 2017-04-20 DIAGNOSIS — K224 Dyskinesia of esophagus: Secondary | ICD-10-CM | POA: Diagnosis not present

## 2017-04-27 DIAGNOSIS — H401131 Primary open-angle glaucoma, bilateral, mild stage: Secondary | ICD-10-CM | POA: Diagnosis not present

## 2017-05-16 ENCOUNTER — Telehealth: Payer: Self-pay | Admitting: Gastroenterology

## 2017-05-16 NOTE — Telephone Encounter (Signed)
The pt has been advised and will keep colon appt as scheduled.

## 2017-05-16 NOTE — Telephone Encounter (Signed)
She does not need EGD.  Will discuss  More at time of her colonoscopy in 2 weeks.

## 2017-05-16 NOTE — Telephone Encounter (Signed)
The pt has an appt for colon on 5/24. Please review barium swallow .  Does she need EGD as well?

## 2017-05-27 ENCOUNTER — Ambulatory Visit (AMBULATORY_SURGERY_CENTER): Payer: Medicare Other | Admitting: Gastroenterology

## 2017-05-27 ENCOUNTER — Encounter: Payer: Self-pay | Admitting: Gastroenterology

## 2017-05-27 ENCOUNTER — Other Ambulatory Visit: Payer: Self-pay

## 2017-05-27 VITALS — BP 168/88 | HR 68 | Temp 98.9°F | Resp 15 | Ht 63.0 in | Wt 156.0 lb

## 2017-05-27 DIAGNOSIS — Z800 Family history of malignant neoplasm of digestive organs: Secondary | ICD-10-CM | POA: Diagnosis not present

## 2017-05-27 DIAGNOSIS — K625 Hemorrhage of anus and rectum: Secondary | ICD-10-CM

## 2017-05-27 DIAGNOSIS — Z86010 Personal history of colonic polyps: Secondary | ICD-10-CM

## 2017-05-27 DIAGNOSIS — E669 Obesity, unspecified: Secondary | ICD-10-CM | POA: Diagnosis not present

## 2017-05-27 DIAGNOSIS — I10 Essential (primary) hypertension: Secondary | ICD-10-CM | POA: Diagnosis not present

## 2017-05-27 MED ORDER — SODIUM CHLORIDE 0.9 % IV SOLN: 500.0000 mL | Freq: Once | INTRAVENOUS | Status: DC

## 2017-05-27 NOTE — Progress Notes (Signed)
Report to PACU, RN, vss, BBS= Clear.  

## 2017-05-27 NOTE — Patient Instructions (Signed)
Resume current diet and medications.  Follow-up colonoscopy in five years.    YOU HAD AN ENDOSCOPIC PROCEDURE TODAY AT [facility]:   Refer to the procedure report that was given to you for any specific questions about what was found during the examination.  If the procedure report does not answer your questions, please call your gastroenterologist to clarify.  If you requested that your care partner not be given the details of your procedure findings, then the procedure report has been included in a sealed envelope for you to review at your convenience later.  YOU SHOULD EXPECT: Some feelings of bloating in the abdomen. Passage of more gas than usual.  Walking can help get rid of the air that was put into your GI tract during the procedure and reduce the bloating. If you had a lower endoscopy (such as a colonoscopy or flexible sigmoidoscopy) you may notice spotting of blood in your stool or on the toilet paper. If you underwent a bowel prep for your procedure, you may not have a normal bowel movement for a few days.  Please Note:  You might notice some irritation and congestion in your nose or some drainage.  This is from the oxygen used during your procedure.  There is no need for concern and it should clear up in a day or so.  SYMPTOMS TO REPORT IMMEDIATELY:   Following lower endoscopy (colonoscopy or flexible sigmoidoscopy):  Excessive amounts of blood in the stool  Significant tenderness or worsening of abdominal pains  Swelling of the abdomen that is new, acute  Fever of 100F or higher    For urgent or emergent issues, a gastroenterologist can be reached at any hour by calling [phone].   DIET:  We do recommend a small meal at first, but then you may proceed to your regular diet.  Drink plenty of fluids but you should avoid alcoholic beverages for 24 hours.  ACTIVITY:  You should plan to take it easy for the rest of today and you should NOT DRIVE or use heavy  machinery until tomorrow (because of the sedation medicines used during the test).    FOLLOW UP: Our staff will call the number listed on your records the next business day following your procedure to check on you and address any questions or concerns that you may have regarding the information given to you following your procedure. If we do not reach you, we will leave a message.  However, if you are feeling well and you are not experiencing any problems, there is no need to return our call.  We will assume that you have returned to your regular daily activities without incident.  If any biopsies were taken you will be contacted by phone or by letter within the next 1-3 weeks.  Please call us at [phone] if you have not heard about the biopsies in 3 weeks.    SIGNATURES/CONFIDENTIALITY: You and/or your care partner have signed paperwork which will be entered into your electronic medical record.  These signatures attest to the fact that that the information above on your After Visit Summary has been reviewed and is understood.  Full responsibility of the confidentiality of this discharge information lies with you and/or your care-partner.

## 2017-05-27 NOTE — Op Note (Signed)
[facility] Patient Name: Marissa Orozco Procedure Date: 05/27/2017 2:41 PM MRN: [id number] Endoscopist: Milus Banister , MD Age: 72 Referring MD:  Date of Birth: 1945-07-29 Gender: Female Account #: [id number] Procedure:                Colonoscopy Indications:              Rectal bleeding; Family history of colon cancer                            (both parents) and personal history of adenomatous                            polyps: Colonoscopy 05/2008 with Dr. Collene Mares done for                            "personal history of polyps, FH of colon cancer                            (mother &father)" Two "small cecal polyps" that                            were not adenomas by pathology; 06/04/13 colonoscopy                            Dr. Ardis Hughs with one polyp. Pathology was adenomatous Medicines:                Monitored Anesthesia Care Procedure:                Pre-Anesthesia Assessment:                           - Prior to the procedure, a History and Physical                            was performed, and patient medications and                            allergies were reviewed. The patient's tolerance of                            previous anesthesia was also reviewed. The risks                            and benefits of the procedure and the sedation                            options and risks were discussed with the patient.                            All questions were answered, and informed consent                            was obtained. Prior Anticoagulants: The patient has  taken no previous anticoagulant or antiplatelet                            agents. ASA Grade Assessment: II - A patient with                            mild systemic disease. After reviewing the risks                            and benefits, the patient was deemed in                            satisfactory condition to undergo the procedure.                           After  obtaining informed consent, the colonoscope                            was passed under direct vision. Throughout the                            procedure, the patient's blood pressure, pulse, and                            oxygen saturations were monitored continuously. The                            Colonoscope was introduced through the anus and                            advanced to the the cecum, identified by                            appendiceal orifice and ileocecal valve. The                            colonoscopy was performed without difficulty. The                            patient tolerated the procedure well. The quality                            of the bowel preparation was good. The ileocecal                            valve, appendiceal orifice, and rectum were                            photographed. Scope In: 2:47:09 PM Scope Out: 2:57:13 PM Scope Withdrawal Time: 0 hours 7 minutes 13 seconds  Total Procedure Duration: 0 hours 10 minutes 4 seconds  Findings:                 The entire examined colon appeared normal on direct  and retroflexion views.                           No polyps or cancers. Complications:            No immediate complications. Estimated blood loss:                            None. Estimated Blood Loss:     Estimated blood loss: none. Impression:               - The entire examined colon is normal on direct and                            retroflexion views.                           - No polyps or cancers.                           - Your recent mild bleeding was probably from a                            small hemorrhoid that has since resolved. Recommendation:           - Patient has a contact number available for                            emergencies. The signs and symptoms of potential                            delayed complications were discussed with the                            patient. Return to normal  activities tomorrow.                            Written discharge instructions were provided to the                            patient.                           - Resume previous diet.                           - Continue present medications.                           - Repeat colonoscopy in 5 years for screening. Milus Banister, MD 05/27/2017 3:04:21 PM This report has been signed electronically.

## 2017-05-31 ENCOUNTER — Telehealth: Payer: Self-pay | Admitting: *Deleted

## 2017-05-31 NOTE — Telephone Encounter (Signed)
  Follow up Call-  Call back number 05/27/2017  Post procedure Call Back phone  # [phone]  Permission to leave phone message Yes  Some recent data might be hidden     Patient questions:  Do you have a fever, pain , or abdominal swelling? No. Pain Score  0 *  Have you tolerated food without any problems? Yes.    Have you been able to return to your normal activities? Yes.    Do you have any questions about your discharge instructions: Diet   No. Medications  No. Follow up visit  No.  Do you have questions or concerns about your Care? No.  Actions: * If pain score is 4 or above: No action needed, pain <4.

## 2017-08-31 DIAGNOSIS — H401131 Primary open-angle glaucoma, bilateral, mild stage: Secondary | ICD-10-CM | POA: Diagnosis not present

## 2017-09-02 DIAGNOSIS — Z23 Encounter for immunization: Secondary | ICD-10-CM | POA: Diagnosis not present

## 2017-09-19 DIAGNOSIS — L649 Androgenic alopecia, unspecified: Secondary | ICD-10-CM | POA: Diagnosis not present

## 2017-09-19 DIAGNOSIS — L658 Other specified nonscarring hair loss: Secondary | ICD-10-CM | POA: Diagnosis not present

## 2017-09-19 DIAGNOSIS — L659 Nonscarring hair loss, unspecified: Secondary | ICD-10-CM | POA: Diagnosis not present

## 2017-10-08 ENCOUNTER — Encounter: Payer: Self-pay | Admitting: Internal Medicine

## 2017-10-08 DIAGNOSIS — R1310 Dysphagia, unspecified: Secondary | ICD-10-CM | POA: Insufficient documentation

## 2017-10-08 DIAGNOSIS — E2839 Other primary ovarian failure: Secondary | ICD-10-CM | POA: Insufficient documentation

## 2017-10-08 DIAGNOSIS — I10 Essential (primary) hypertension: Secondary | ICD-10-CM | POA: Insufficient documentation

## 2017-10-19 ENCOUNTER — Ambulatory Visit
Admission: RE | Admit: 2017-10-19 | Discharge: 2017-10-19 | Disposition: A | Discharge status: Home / Self Care | Payer: Medicare Other | Source: Ambulatory Visit | Attending: Internal Medicine | Admitting: Internal Medicine

## 2017-10-19 DIAGNOSIS — Z139 Encounter for screening, unspecified: Secondary | ICD-10-CM

## 2017-10-19 DIAGNOSIS — Z1231 Encounter for screening mammogram for malignant neoplasm of breast: Secondary | ICD-10-CM | POA: Diagnosis not present

## 2017-10-19 DIAGNOSIS — Z780 Asymptomatic menopausal state: Secondary | ICD-10-CM | POA: Diagnosis not present

## 2017-10-19 DIAGNOSIS — M8589 Other specified disorders of bone density and structure, multiple sites: Secondary | ICD-10-CM | POA: Diagnosis not present

## 2017-10-19 DIAGNOSIS — E2839 Other primary ovarian failure: Secondary | ICD-10-CM

## 2017-10-20 NOTE — Progress Notes (Signed)
Hello,   Your dexa scan is significant for osteopenia. Please continue with vitamin D and calcium supplementation. It is also important to engage in weight-bearing exercises at least three days weekly. We will re-evaluate in two years.   Sincerely,    Kyleeann Cremeans N. Baird Cancer, MD

## 2017-11-02 ENCOUNTER — Ambulatory Visit (INDEPENDENT_AMBULATORY_CARE_PROVIDER_SITE_OTHER): Payer: Medicare Other | Admitting: Internal Medicine

## 2017-11-02 ENCOUNTER — Encounter: Payer: Self-pay | Admitting: Internal Medicine

## 2017-11-02 VITALS — BP 136/82 | HR 55 | Temp 97.8°F | Ht 63.0 in | Wt 159.0 lb

## 2017-11-02 DIAGNOSIS — E7800 Pure hypercholesterolemia, unspecified: Secondary | ICD-10-CM | POA: Diagnosis not present

## 2017-11-02 DIAGNOSIS — L309 Dermatitis, unspecified: Secondary | ICD-10-CM | POA: Insufficient documentation

## 2017-11-02 DIAGNOSIS — Z79899 Other long term (current) drug therapy: Secondary | ICD-10-CM | POA: Diagnosis not present

## 2017-11-02 DIAGNOSIS — E785 Hyperlipidemia, unspecified: Secondary | ICD-10-CM | POA: Insufficient documentation

## 2017-11-02 DIAGNOSIS — R7309 Other abnormal glucose: Secondary | ICD-10-CM | POA: Diagnosis not present

## 2017-11-02 DIAGNOSIS — I10 Essential (primary) hypertension: Secondary | ICD-10-CM

## 2017-11-02 MED ORDER — TRIAMCINOLONE ACETONIDE 0.1 % EX CREA: TOPICAL_CREAM | Freq: Two times a day (BID) | CUTANEOUS | Status: DC

## 2017-11-02 NOTE — Progress Notes (Signed)
Subjective:     Patient ID: Marissa Orozco , female    DOB: 05-10-1945 , 72 y.o.   MRN: [id number]   Chief Complaint  Patient presents with  . Hypertension  . Hyperlipidemia    HPI  Hypertension  This is a chronic problem. The current episode started more than 1 year ago. The problem is unchanged. The problem is controlled. Pertinent negatives include no blurred vision, chest pain, headaches, neck pain or shortness of breath. Risk factors for coronary artery disease include post-menopausal state and dyslipidemia.    HYPERLIPIDEMIA She has been taking her chol meds without any issues. She denies having any side effects. Recently returned from trip to [place].   Past Medical History:  Diagnosis Date  . Breast lump   . Colon polyps   . Fibroids   . GERD (gastroesophageal reflux disease)   . Glaucoma   . Hyperlipidemia 1986  . Hypertension 1986  . Interstitial cystitis 1999  . Kidney stones 2005  . Osteoporosis   . Tonsillitis      Family History  Problem Relation Age of Onset  . Coronary artery disease Father        mother, PGM  . Stroke Father   . Hypertension Father   . Colon cancer Father 73  . Hypertension Mother   . Heart disease Mother   . Colon cancer Mother 11  . Diabetes Unknown   . Colon cancer Maternal Grandmother   . Breast cancer Neg Hx      Current Outpatient Medications:  .  CALCIUM PO, Take 1 tablet by mouth daily., Disp: , Rfl:  .  Cholecalciferol (VITAMIN D PO), Take 1 tablet by mouth daily., Disp: , Rfl:  .  finasteride (PROSCAR) 5 MG tablet, Take 2.5 mg by mouth daily.  , Disp: , Rfl:  .  latanoprost (XALATAN) 0.005 % ophthalmic solution, Place 1 drop into both eyes daily., Disp: , Rfl:  .  lisinopril (PRINIVIL,ZESTRIL) 5 MG tablet, Take 5 mg by mouth daily.  , Disp: , Rfl:  .  pravastatin (PRAVACHOL) 80 MG tablet, Take 80 mg by mouth daily., Disp: , Rfl:   Current Facility-Administered Medications:  .  triamcinolone cream (KENALOG) 0.1 %,  , Topical, BID, Glendale Chard, MD   Allergies  Allergen Reactions  . Hctz [Hydrochlorothiazide] Itching     Review of Systems  Constitutional: Negative.   Eyes: Negative for blurred vision.  Respiratory: Negative.  Negative for shortness of breath.   Cardiovascular: Negative.  Negative for chest pain.  Gastrointestinal: Negative.   Musculoskeletal: Negative for neck pain.  Skin: Positive for rash (she c/o rash on b/l lower extremities. Not sure what triggered her sx. Does not itch. no redness. recently returned from trip to [place]).  Neurological: Negative for headaches.  Psychiatric/Behavioral: Negative.      Today's Vitals   11/02/17 1054  BP: 136/82  Pulse: (!) 55  Temp: 97.8 F (36.6 C)  TempSrc: Oral  Weight: 159 lb (72.1 kg)  Height: 5\' 3"  (1.6 m)  PainSc: 0-No pain   Body mass index is 28.17 kg/m.   Objective:  Physical Exam  Constitutional: She is oriented to person, place, and time. She appears well-developed and well-nourished.  HENT:  Head: Normocephalic and atraumatic.  Eyes: EOM are normal.  Cardiovascular: Normal rate, regular rhythm and normal heart sounds.  Pulmonary/Chest: Effort normal and breath sounds normal.  Neurological: She is alert and oriented to person, place, and time.  Skin: Skin is warm  and dry.  She has several hyperpigmented lesions on lower extremities. NO surrounding erythema. Lesions on right lower extremity are more dry/scaly than those on left leg.   Psychiatric: She has a normal mood and affect.  Nursing note and vitals reviewed.       Assessment And Plan:     1. Essential hypertension, benign  Well controlled. She will continue with current meds. She is encouraged to avoid adding salt to her foods. She will rto in six months for re-evaluation.  2. Pure hypercholesterolemia  I will check a fasting lipid panel and LFTs.  She is encouraged to avoid fried foods, remain active and eat fish twice weekly.   3.  Dermatitis  She was given rx to apply to affected areas twice daily as needed. She will let me know if her symptoms persist.   - triamcinolone cream (KENALOG) 0.1 %  4. Other abnormal glucose  She has had elevated blood sugars in the past.  She is encouraged to avoid sugary beverages and processed foods.   - Hemoglobin A1c  5. Drug therapy   Maximino Greenland, MD

## 2017-11-02 NOTE — Patient Instructions (Signed)

## 2017-11-03 LAB — CMP14+EGFR
ALT: 24 IU/L (ref 0–32)
AST: 27 IU/L (ref 0–40)
Albumin/Globulin Ratio: 1.7 (ref 1.2–2.2)
Albumin: 4.2 g/dL (ref 3.5–4.8)
Alkaline Phosphatase: 94 IU/L (ref 39–117)
BUN/Creatinine Ratio: 21 (ref 12–28)
BUN: 16 mg/dL (ref 8–27)
Bilirubin Total: 0.7 mg/dL (ref 0.0–1.2)
CO2: 23 mmol/L (ref 20–29)
Calcium: 9.9 mg/dL (ref 8.7–10.3)
Chloride: 106 mmol/L (ref 96–106)
Creatinine, Ser: 0.78 mg/dL (ref 0.57–1.00)
GFR calc Af Amer: 87 mL/min/{1.73_m2} (ref >59)
GFR calc non Af Amer: 76 mL/min/{1.73_m2} (ref >59)
Globulin, Total: 2.5 g/dL (ref 1.5–4.5)
Glucose: 91 mg/dL (ref 65–99)
Potassium: 4.9 mmol/L (ref 3.5–5.2)
Sodium: 142 mmol/L (ref 134–144)
Total Protein: 6.7 g/dL (ref 6.0–8.5)

## 2017-11-03 LAB — HEMOGLOBIN A1C
Est. average glucose Bld gHb Est-mCnc: 120 mg/dL
Hgb A1c MFr Bld: 5.8 % — ABNORMAL HIGH (ref 4.8–5.6)

## 2017-11-03 LAB — LIPID PANEL
Chol/HDL Ratio: 4.5 ratio — ABNORMAL HIGH (ref 0.0–4.4)
Cholesterol, Total: 255 mg/dL — ABNORMAL HIGH (ref 100–199)
HDL: 57 mg/dL (ref >39)
LDL Calculated: 167 mg/dL — ABNORMAL HIGH (ref 0–99)
Triglycerides: 156 mg/dL — ABNORMAL HIGH (ref 0–149)
VLDL Cholesterol Cal: 31 mg/dL (ref 5–40)

## 2017-11-03 NOTE — Progress Notes (Signed)
Here are your lab results:  Your hba1c is 5.8, down from 6.0. This is great! Normal is less than 5.6. Your cholesterol is quite elevated! Have you been taking your medication regularly? Your LDL, bad cholesterol, is 167. Ideally, this should be less than 100.  At your last visit, it was 129.  What do you think happened?   Lastly, your liver and kidney function are stable. Be sure to exercise regularly and stay well hydrated. Let me know if you have any questions.   Sincerely,    Verla Bryngelson N. Baird Cancer, MD

## 2017-11-04 ENCOUNTER — Other Ambulatory Visit: Payer: Self-pay

## 2017-11-04 MED ORDER — TRIAMCINOLONE ACETONIDE 0.1 % EX CREA: TOPICAL_CREAM | CUTANEOUS | 0 refills | Status: DC

## 2017-11-18 ENCOUNTER — Other Ambulatory Visit: Payer: Self-pay

## 2017-12-14 ENCOUNTER — Other Ambulatory Visit: Payer: Self-pay

## 2017-12-14 MED ORDER — PRAVASTATIN SODIUM 80 MG PO TABS: 80.0000 mg | ORAL_TABLET | Freq: Every day | ORAL | 0 refills | Status: DC

## 2017-12-19 ENCOUNTER — Other Ambulatory Visit: Payer: Self-pay

## 2017-12-19 MED ORDER — PRAVASTATIN SODIUM 80 MG PO TABS: 80.0000 mg | ORAL_TABLET | Freq: Every day | ORAL | 0 refills | Status: DC

## 2017-12-22 ENCOUNTER — Other Ambulatory Visit: Payer: Self-pay

## 2017-12-22 MED ORDER — LISINOPRIL 10 MG PO TABS: 10.0000 mg | ORAL_TABLET | Freq: Every day | ORAL | 1 refills | Status: DC

## 2018-01-30 DIAGNOSIS — Z23 Encounter for immunization: Secondary | ICD-10-CM | POA: Diagnosis not present

## 2018-02-01 DIAGNOSIS — H401131 Primary open-angle glaucoma, bilateral, mild stage: Secondary | ICD-10-CM | POA: Diagnosis not present

## 2018-03-31 ENCOUNTER — Other Ambulatory Visit: Payer: Self-pay | Admitting: Internal Medicine

## 2018-05-16 ENCOUNTER — Telehealth: Payer: Self-pay | Admitting: Internal Medicine

## 2018-05-16 NOTE — Telephone Encounter (Signed)
PT CALLED IN STATED THAT SHE IS UNABLE TO COMMIT TO APPT TIME ON 05/14 OFFERED PT VIRTUAL VISIT FOR THE APPTS PT CONSENTED TO VIRTUAL

## 2018-05-18 ENCOUNTER — Ambulatory Visit (INDEPENDENT_AMBULATORY_CARE_PROVIDER_SITE_OTHER): Payer: Medicare Other

## 2018-05-18 ENCOUNTER — Ambulatory Visit: Payer: Self-pay

## 2018-05-18 ENCOUNTER — Other Ambulatory Visit: Payer: Self-pay | Admitting: Internal Medicine

## 2018-05-18 ENCOUNTER — Encounter: Payer: Self-pay | Admitting: Internal Medicine

## 2018-05-18 ENCOUNTER — Other Ambulatory Visit: Payer: Self-pay

## 2018-05-18 ENCOUNTER — Ambulatory Visit (INDEPENDENT_AMBULATORY_CARE_PROVIDER_SITE_OTHER): Payer: Medicare Other | Admitting: Internal Medicine

## 2018-05-18 ENCOUNTER — Ambulatory Visit: Payer: Self-pay | Admitting: Internal Medicine

## 2018-05-18 VITALS — BP 130/88 | Temp 97.2°F | Ht 63.0 in | Wt 156.0 lb

## 2018-05-18 VITALS — BP 130/88 | HR 65 | Temp 97.2°F | Ht 63.0 in | Wt 156.0 lb

## 2018-05-18 DIAGNOSIS — Z0000 Encounter for general adult medical examination without abnormal findings: Secondary | ICD-10-CM | POA: Diagnosis not present

## 2018-05-18 DIAGNOSIS — I10 Essential (primary) hypertension: Secondary | ICD-10-CM

## 2018-05-18 DIAGNOSIS — L309 Dermatitis, unspecified: Secondary | ICD-10-CM

## 2018-05-18 DIAGNOSIS — E7800 Pure hypercholesterolemia, unspecified: Secondary | ICD-10-CM | POA: Diagnosis not present

## 2018-05-18 DIAGNOSIS — R7309 Other abnormal glucose: Secondary | ICD-10-CM

## 2018-05-18 NOTE — Patient Instructions (Signed)

## 2018-05-18 NOTE — Progress Notes (Signed)
Subjective:   Marissa Orozco is a 73 y.o. female who presents for Medicare Annual (Subsequent) preventive examination.  This visit type was conducted due to national recommendations for restrictions regarding the COVID-19 Pandemic (e.g. social distancing). This format is felt to be most appropriate for this patient at this time. All issues noted in this document were discussed and addressed. No physical exam was performed (except for noted visual exam findings with Video Visits). The patient, Marissa Orozco, has given consent to perform this visit via video. Vital signs may be absent or patient reported.   Patient location: at home   Nurse location:  [place] office   Review of Systems:  n/a Cardiac Risk Factors include: hypertension;sedentary lifestyle     Objective:     Vitals: BP 130/88 Comment: per patient  Temp (!) 97.2 F (36.2 C) Comment: per patient  Ht 5\' 3"  (1.6 m) Comment: per patient  Wt 156 lb (70.8 kg) Comment: per patient  BMI 27.63 kg/m   Body mass index is 27.63 kg/m.  Advanced Directives 05/18/2018 05/27/2017 05/22/2013  Does Patient Have a Medical Advance Directive? Yes Yes Patient has advance directive, copy not in chart  Type of Advance Directive [place];Living will [place];Living will [place] in Chart? No - copy requested - -    Tobacco Social History   Tobacco Use  Smoking Status Never Smoker  Smokeless Tobacco Never Used     Counseling given: Not Answered   Clinical Intake:  Pre-visit preparation completed: Yes  Pain : No/denies pain Pain Score: 0-No pain     Nutritional Status: BMI 25 -29 Overweight Nutritional Risks: None Diabetes: No  How often do you need to have someone help you when you read instructions, pamphlets, or other written materials from your doctor or pharmacy?: 1 - Never What is the last grade level you completed  in school?: NAllen LPN  Interpreter Needed?: No  Information entered by :: NAllen LPN  Past Medical History:  Diagnosis Date  . Breast lump   . Colon polyps   . Fibroids   . GERD (gastroesophageal reflux disease)   . Glaucoma   . Hyperlipidemia 1986  . Hypertension 1986  . Interstitial cystitis 1999  . Kidney stones 2005  . Osteoporosis   . Tonsillitis    Past Surgical History:  Procedure Laterality Date  . BREAST EXCISIONAL BIOPSY Right 2013?   Benign  . BREAST LUMPECTOMY Right 2008   right breast; benign  . COLONOSCOPY  2010   Dr Collene Mares  . HYSTEROSCOPY  1993  . LAPAROSCOPIC HYSTERECTOMY  1994  . MYOMECTOMY  1993   excision of tumor  . POLYPECTOMY    . TONSILLECTOMY  1953   Family History  Problem Relation Age of Onset  . Coronary artery disease Father        mother, PGM  . Stroke Father   . Hypertension Father   . Colon cancer Father 25  . Hypertension Mother   . Heart disease Mother   . Colon cancer Mother 71  . Diabetes Other   . Colon cancer Maternal Grandmother   . Breast cancer Neg Hx    Social History   Socioeconomic History  . Marital status: Married    Spouse name: Not on file  . Number of children: 2  . Years of education: Not on file  . Highest education level: Not on file  Occupational History  . Occupation: retired  Scientific laboratory technician  . Financial resource strain: Not hard at all  . Food insecurity:    Worry: Never true    Inability: Never true  . Transportation needs:    Medical: No    Non-medical: No  Tobacco Use  . Smoking status: Never Smoker  . Smokeless tobacco: Never Used  Substance and Sexual Activity  . Alcohol use: Yes    Alcohol/week: 7.0 standard drinks    Types: 7 Glasses of wine per week  . Drug use: No  . Sexual activity: Not Currently  Lifestyle  . Physical activity:    Days per week: 0 days    Minutes per session: 0 min  . Stress: Not on file  Relationships  . Social connections:    Talks on phone: Not on file     Gets together: Not on file    Attends religious service: Not on file    Active member of club or organization: Not on file    Attends meetings of clubs or organizations: Not on file    Relationship status: Not on file  Other Topics Concern  . Not on file  Social History Narrative  . Not on file    Outpatient Encounter Medications as of 05/18/2018  Medication Sig  . CALCIUM PO Take 1 tablet by mouth daily.  . Cholecalciferol (VITAMIN D PO) Take 1 tablet by mouth daily.  . finasteride (PROSCAR) 5 MG tablet Take 2.5 mg by mouth daily.    Marland Kitchen latanoprost (XALATAN) 0.005 % ophthalmic solution Place 1 drop into both eyes daily.  . pravastatin (PRAVACHOL) 80 MG tablet TAKE 1 TABLET EVERY DAY  . triamcinolone cream (KENALOG) 0.1 % Apply to affected area 2 times per day prn  . [DISCONTINUED] lisinopril (PRINIVIL,ZESTRIL) 10 MG tablet Take 1 tablet (10 mg total) by mouth daily.  . [DISCONTINUED] triamcinolone cream (KENALOG) 0.1 %    No facility-administered encounter medications on file as of 05/18/2018.     Activities of Daily Living In your present state of health, do you have any difficulty performing the following activities: 05/18/2018  Hearing? N  Vision? N  Difficulty concentrating or making decisions? N  Walking or climbing stairs? N  Dressing or bathing? N  Doing errands, shopping? N  Preparing Food and eating ? N  Using the Toilet? N  In the past six months, have you accidently leaked urine? N  Do you have problems with loss of bowel control? N  Managing your Medications? N  Managing your Finances? N  Housekeeping or managing your Housekeeping? N  Some recent data might be hidden    Patient Care Team: Glendale Chard, MD as PCP - General (Internal Medicine)    Assessment:   This is a routine wellness examination for [name].  Exercise Activities and Dietary recommendations    Goals    . Weight (lb) < 200 lb (90.7 kg)     Would like to weigh 142 pounds        Fall Risk Fall Risk  05/18/2018 05/18/2018 11/18/2017 11/02/2017 07/26/2016  Falls in the past year? 0 0 0 No No  Comment - - Emmi Telephone Survey: data to providers prior to load - Emmi Telephone Survey: data to providers prior to load  Risk for fall due to : Medication side effect - - - -  Follow up Education provided - - - -   Is the patient's home free of loose throw rugs in walkways,  pet beds, electrical cords, etc?   yes      Grab bars in the bathroom? no      Handrails on the stairs?   yes      Adequate lighting?   yes  Timed Get Up and Go performed: n/a  Depression Screen PHQ 2/9 Scores 05/18/2018 05/18/2018 11/02/2017  PHQ - 2 Score 0 0 0  PHQ- 9 Score 0 - -     Cognitive Function     6CIT Screen 05/18/2018  What Year? 0 points  What month? 0 points  What time? 0 points  Count back from 20 0 points  Months in reverse 0 points  Repeat phrase 0 points  Total Score 0    Immunization History  Administered Date(s) Administered  . Influenza-Unspecified 09/02/2017  . Pneumococcal Polysaccharide-23 01/30/2018  . Tdap 06/01/2012    Qualifies for Shingles Vaccine? yes  Screening Tests Health Maintenance  Topic Date Due  . INFLUENZA VACCINE  08/05/2018  . PNA vac Low Risk Adult (2 of 2 - PPSV23) 01/31/2019  . MAMMOGRAM  10/20/2019  . COLONOSCOPY  05/28/2022  . TETANUS/TDAP  06/02/2022  . DEXA SCAN  Completed  . Hepatitis C Screening  Completed    Cancer Screenings: Lung: Low Dose CT Chest recommended if Age 53-80 years, 30 pack-year currently smoking OR have quit w/in 15years. Patient does not qualify. Breast:  Up to date on Mammogram? Yes   Up to date of Bone Density/Dexa? Yes Colorectal: up to date  Additional Screenings: : Hepatitis C Screening: 04/13/2012     Plan:    Wants to get to 142 pounds.   I have personally reviewed and noted the following in the patient's chart:   . Medical and social history . Use of alcohol, tobacco or illicit drugs  .  Current medications and supplements . Functional ability and status . Nutritional status . Physical activity . Advanced directives . List of other physicians . Hospitalizations, surgeries, and ER visits in previous 12 months . Vitals . Screenings to include cognitive, depression, and falls . Referrals and appointments  In addition, I have reviewed and discussed with patient certain preventive protocols, quality metrics, and best practice recommendations. A written personalized care plan for preventive services as well as general preventive health recommendations were provided to patient.     Kellie Simmering, LPN  [id number]

## 2018-05-18 NOTE — Patient Instructions (Signed)
Marissa Orozco , Thank you for taking time to come for your Medicare Wellness Visit. I appreciate your ongoing commitment to your health goals. Please review the following plan we discussed and let me know if I can assist you in the future.   Screening recommendations/referrals: Colonoscopy: 05/2017 Mammogram: 10/2017 Bone Density: 10/2017 Recommended yearly ophthalmology/optometry visit for glaucoma screening and checkup Recommended yearly dental visit for hygiene and checkup  Vaccinations: Influenza vaccine: 08/2017 Pneumococcal vaccine: 01/2018 Tdap vaccine: 05/2012 Shingles vaccine: 10/2012    Advanced directives: Please bring a copy of your POA (Power of [place]) and/or Living Will to your next appointment.    Conditions/risks identified: Overweight  Next appointment: 11/20/2018 at 10:30   Preventive Care 23 Years and Older, Female Preventive care refers to lifestyle choices and visits with your health care provider that can promote health and wellness. What does preventive care include?  A yearly physical exam. This is also called an annual well check.  Dental exams once or twice a year.  Routine eye exams. Ask your health care provider how often you should have your eyes checked.  Personal lifestyle choices, including:  Daily care of your teeth and gums.  Regular physical activity.  Eating a healthy diet.  Avoiding tobacco and drug use.  Limiting alcohol use.  Practicing safe sex.  Taking low-dose aspirin every day.  Taking vitamin and mineral supplements as recommended by your health care provider. What happens during an annual well check? The services and screenings done by your health care provider during your annual well check will depend on your age, overall health, lifestyle risk factors, and family history of disease. Counseling  Your health care provider may ask you questions about your:  Alcohol use.  Tobacco use.  Drug use.  Emotional  well-being.  Home and relationship well-being.  Sexual activity.  Eating habits.  History of falls.  Memory and ability to understand (cognition).  Work and work Statistician.  Reproductive health. Screening  You may have the following tests or measurements:  Height, weight, and BMI.  Blood pressure.  Lipid and cholesterol levels. These may be checked every 5 years, or more frequently if you are over 59 years old.  Skin check.  Lung cancer screening. You may have this screening every year starting at age 2 if you have a 30-pack-year history of smoking and currently smoke or have quit within the past 15 years.  Fecal occult blood test (FOBT) of the stool. You may have this test every year starting at age 53.  Flexible sigmoidoscopy or colonoscopy. You may have a sigmoidoscopy every 5 years or a colonoscopy every 10 years starting at age 23.  Hepatitis C blood test.  Hepatitis B blood test.  Sexually transmitted disease (STD) testing.  Diabetes screening. This is done by checking your blood sugar (glucose) after you have not eaten for a while (fasting). You may have this done every 1-3 years.  Bone density scan. This is done to screen for osteoporosis. You may have this done starting at age 34.  Mammogram. This may be done every 1-2 years. Talk to your health care provider about how often you should have regular mammograms. Talk with your health care provider about your test results, treatment options, and if necessary, the need for more tests. Vaccines  Your health care provider may recommend certain vaccines, such as:  Influenza vaccine. This is recommended every year.  Tetanus, diphtheria, and acellular pertussis (Tdap, Td) vaccine. You may need a Td booster  every 10 years.  Zoster vaccine. You may need this after age 80.  Pneumococcal 13-valent conjugate (PCV13) vaccine. One dose is recommended after age 88.  Pneumococcal polysaccharide (PPSV23) vaccine. One  dose is recommended after age 62. Talk to your health care provider about which screenings and vaccines you need and how often you need them. This information is not intended to replace advice given to you by your health care provider. Make sure you discuss any questions you have with your health care provider. Document Released: 01/17/2015 Document Revised: 09/10/2015 Document Reviewed: 10/22/2014 Elsevier Interactive Patient Education  2017 [place] Prevention in the Home Falls can cause injuries. They can happen to people of all ages. There are many things you can do to make your home safe and to help prevent falls. What can I do on the outside of my home?  Regularly fix the edges of walkways and driveways and fix any cracks.  Remove anything that might make you trip as you walk through a door, such as a raised step or threshold.  Trim any bushes or trees on the path to your home.  Use bright outdoor lighting.  Clear any walking paths of anything that might make someone trip, such as rocks or tools.  Regularly check to see if handrails are loose or broken. Make sure that both sides of any steps have handrails.  Any raised decks and porches should have guardrails on the edges.  Have any leaves, snow, or ice cleared regularly.  Use sand or salt on walking paths during winter.  Clean up any spills in your garage right away. This includes oil or grease spills. What can I do in the bathroom?  Use night lights.  Install grab bars by the toilet and in the tub and shower. Do not use towel bars as grab bars.  Use non-skid mats or decals in the tub or shower.  If you need to sit down in the shower, use a plastic, non-slip stool.  Keep the floor dry. Clean up any water that spills on the floor as soon as it happens.  Remove soap buildup in the tub or shower regularly.  Attach bath mats securely with double-sided non-slip rug tape.  Do not have throw rugs and other  things on the floor that can make you trip. What can I do in the bedroom?  Use night lights.  Make sure that you have a light by your bed that is easy to reach.  Do not use any sheets or blankets that are too big for your bed. They should not hang down onto the floor.  Have a firm chair that has side arms. You can use this for support while you get dressed.  Do not have throw rugs and other things on the floor that can make you trip. What can I do in the kitchen?  Clean up any spills right away.  Avoid walking on wet floors.  Keep items that you use a lot in easy-to-reach places.  If you need to reach something above you, use a strong step stool that has a grab bar.  Keep electrical cords out of the way.  Do not use floor polish or wax that makes floors slippery. If you must use wax, use non-skid floor wax.  Do not have throw rugs and other things on the floor that can make you trip. What can I do with my stairs?  Do not leave any items on the stairs.  Make  sure that there are handrails on both sides of the stairs and use them. Fix handrails that are broken or loose. Make sure that handrails are as long as the stairways.  Check any carpeting to make sure that it is firmly attached to the stairs. Fix any carpet that is loose or worn.  Avoid having throw rugs at the top or bottom of the stairs. If you do have throw rugs, attach them to the floor with carpet tape.  Make sure that you have a light switch at the top of the stairs and the bottom of the stairs. If you do not have them, ask someone to add them for you. What else can I do to help prevent falls?  Wear shoes that:  Do not have high heels.  Have rubber bottoms.  Are comfortable and fit you well.  Are closed at the toe. Do not wear sandals.  If you use a stepladder:  Make sure that it is fully opened. Do not climb a closed stepladder.  Make sure that both sides of the stepladder are locked into place.  Ask  someone to hold it for you, if possible.  Clearly mark and make sure that you can see:  Any grab bars or handrails.  First and last steps.  Where the edge of each step is.  Use tools that help you move around (mobility aids) if they are needed. These include:  Canes.  Walkers.  Scooters.  Crutches.  Turn on the lights when you go into a dark area. Replace any light bulbs as soon as they burn out.  Set up your furniture so you have a clear path. Avoid moving your furniture around.  If any of your floors are uneven, fix them.  If there are any pets around you, be aware of where they are.  Review your medicines with your doctor. Some medicines can make you feel dizzy. This can increase your chance of falling. Ask your doctor what other things that you can do to help prevent falls. This information is not intended to replace advice given to you by your health care provider. Make sure you discuss any questions you have with your health care provider. Document Released: 10/17/2008 Document Revised: 05/29/2015 Document Reviewed: 01/25/2014 Elsevier Interactive Patient Education  2017 [employer].

## 2018-05-20 ENCOUNTER — Encounter: Payer: Self-pay | Admitting: Internal Medicine

## 2018-05-20 NOTE — Progress Notes (Signed)
Virtual Visit via Video   This visit type was conducted due to national recommendations for restrictions regarding the COVID-19 Pandemic (e.g. social distancing) in an effort to limit this patient's exposure and mitigate transmission in our community.  Due to her co-morbid illnesses, this patient is at least at moderate risk for complications without adequate follow up.  This format is felt to be most appropriate for this patient at this time.  All issues noted in this document were discussed and addressed.  A limited physical exam was performed with this format.    This visit type was conducted due to national recommendations for restrictions regarding the COVID-19 Pandemic (e.g. social distancing) in an effort to limit this patient's exposure and mitigate transmission in our community.  Patients identity confirmed using two different identifiers.  This format is felt to be most appropriate for this patient at this time.  All issues noted in this document were discussed and addressed.  No physical exam was performed (except for noted visual exam findings with Video Visits).    Date:  05/20/2018   ID:  Marissa Orozco, DOB June 28, 1945, MRN [id number]  Patient Location:  Home  Provider location:   Office    Chief Complaint:  BP f/u  History of Present Illness:    Marissa Orozco is a 72 y.o. female who presents via video conferencing for a telehealth visit today.    The patient does not have symptoms concerning for COVID-19 infection (fever, chills, cough, or new shortness of breath).   Hypertension  This is a chronic problem. The current episode started more than 1 year ago. The problem is controlled. Pertinent negatives include no blurred vision, chest pain, palpitations or shortness of breath. Risk factors for coronary artery disease include dyslipidemia and post-menopausal state. Past treatments include ACE inhibitors. The current treatment provides moderate improvement.   She  reports compliance with meds.   Past Medical History:  Diagnosis Date  . Breast lump   . Colon polyps   . Fibroids   . GERD (gastroesophageal reflux disease)   . Glaucoma   . Hyperlipidemia 1986  . Hypertension 1986  . Interstitial cystitis 1999  . Kidney stones 2005  . Osteoporosis   . Tonsillitis    Past Surgical History:  Procedure Laterality Date  . BREAST EXCISIONAL BIOPSY Right 2013?   Benign  . BREAST LUMPECTOMY Right 2008   right breast; benign  . COLONOSCOPY  2010   Dr Collene Mares  . HYSTEROSCOPY  1993  . LAPAROSCOPIC HYSTERECTOMY  1994  . MYOMECTOMY  1993   excision of tumor  . POLYPECTOMY    . TONSILLECTOMY  1953     Current Meds  Medication Sig  . CALCIUM PO Take 1 tablet by mouth daily.  . Cholecalciferol (VITAMIN D PO) Take 1 tablet by mouth daily.  . finasteride (PROSCAR) 5 MG tablet Take 2.5 mg by mouth daily.    Marland Kitchen latanoprost (XALATAN) 0.005 % ophthalmic solution Place 1 drop into both eyes daily.  . pravastatin (PRAVACHOL) 80 MG tablet TAKE 1 TABLET EVERY DAY  . triamcinolone cream (KENALOG) 0.1 % Apply to affected area 2 times per day prn  . [DISCONTINUED] lisinopril (PRINIVIL,ZESTRIL) 10 MG tablet Take 1 tablet (10 mg total) by mouth daily.     Allergies:   Hctz [hydrochlorothiazide]   Social History   Tobacco Use  . Smoking status: Never Smoker  . Smokeless tobacco: Never Used  Substance Use Topics  . Alcohol use: Yes  Alcohol/week: 7.0 standard drinks    Types: 7 Glasses of wine per week  . Drug use: No     Family Hx: The patient's family history includes Colon cancer in her maternal grandmother; Colon cancer (age of onset: 75) in her father; Colon cancer (age of onset: 76) in her mother; Coronary artery disease in her father; Diabetes in an other family member; Heart disease in her mother; Hypertension in her father and mother; Stroke in her father. There is no history of Breast cancer.  ROS:   Please see the history of present illness.     Review of Systems  Constitutional: Negative.   Eyes: Negative for blurred vision.  Respiratory: Negative.  Negative for shortness of breath.   Cardiovascular: Negative.  Negative for chest pain and palpitations.  Gastrointestinal: Negative.   Neurological: Negative.   Psychiatric/Behavioral: Negative.    She c/o spots on her legs. She feels they are spreading. They do not itch.   All other systems reviewed and are negative.   Labs/Other Tests and Data Reviewed:    Recent Labs: 11/02/2017: ALT 24; BUN 16; Creatinine, Ser 0.78; Potassium 4.9; Sodium 142   Recent Lipid Panel Lab Results  Component Value Date/Time   CHOL 255 (H) 11/02/2017 11:26 AM   TRIG 156 (H) 11/02/2017 11:26 AM   HDL 57 11/02/2017 11:26 AM   CHOLHDL 4.5 (H) 11/02/2017 11:26 AM   LDLCALC 167 (H) 11/02/2017 11:26 AM    Wt Readings from Last 3 Encounters:  05/18/18 156 lb (70.8 kg)  05/18/18 156 lb (70.8 kg)  11/02/17 159 lb (72.1 kg)     Exam:    Vital Signs:  BP 130/88 (BP Location: Left Arm, Patient Position: Sitting, Cuff Size: Normal) Comment: pt provided  Pulse 65 Comment: pt provided  Temp (!) 97.2 F (36.2 C) (Oral) Comment: pt provided  Ht _0  (1.6 m)   Wt 156 lb (70.8 kg) Comment: pt provided  BMI 27.63 kg/m     Physical Exam  Constitutional: She is oriented to person, place, and time and well-developed, well-nourished, and in no distress.  HENT:  Head: Normocephalic and atraumatic.  Pulmonary/Chest: Effort normal.  Neurological: She is alert and oriented to person, place, and time.  Skin:  Scattered areas of hyperpigmentation, appear to be nodular  Psychiatric: Affect normal.  Nursing note and vitals reviewed.   ASSESSMENT & PLAN:     1. Essential hypertension, benign  Chronic, controlled. She is aware that optimal BP readings are less than 120/80. She is encouraged to resume her regular exercise regimen of 150 minutes per week. She is also encouraged to avoid adding salt to  her foods. She will rto in six months for re-evaluation.   - Lipid panel; Future - CMP14+EGFR; Future - CBC with Diff; Future  2. Pure hypercholesterolemia  She agrees to come in next week for labwork. I will check fasting lipid panel and LFTs at that time. She is encouraged to limit her intake of fried foods and to exercise regularly.   3. Dermatitis  She will continue with triamcinolone cream as needed. Pt aware that she may need derm evaluation.   4. Other abnormal glucose  HER A1C HAS BEEN ELEVATED IN THE PAST. I WILL CHECK AN A1C, BMET TODAY. SHE WAS ENCOURAGED TO AVOID SUGARY BEVERAGES AND PROCESSED FOODS INCLUDNG BREADS, RICE AND PASTA.  - Hemoglobin A1c; Future    COVID-19 Education: The signs and symptoms of COVID-19 were discussed with the patient and how  to seek care for testing (follow up with PCP or arrange E-visit).  The importance of social distancing was discussed today.  Patient Risk:   After full review of this patients clinical status, I feel that they are at least moderate risk at this time.  Time:   Today, I have spent 11 minutes with the patient with telehealth technology discussing above diagnoses.     Medication Adjustments/Labs and Tests Ordered: Current medicines are reviewed at length with the patient today.  Concerns regarding medicines are outlined above.   Tests Ordered: Orders Placed This Encounter  Procedures  . Lipid panel  . CMP14+EGFR  . Hemoglobin A1c  . CBC with Diff    Medication Changes: No orders of the defined types were placed in this encounter.   Disposition:  Follow up in 6 month(s)  Signed, Maximino Greenland, MD

## 2018-05-25 ENCOUNTER — Other Ambulatory Visit (INDEPENDENT_AMBULATORY_CARE_PROVIDER_SITE_OTHER): Payer: Medicare Other

## 2018-05-25 ENCOUNTER — Other Ambulatory Visit: Payer: Self-pay

## 2018-05-25 DIAGNOSIS — R7309 Other abnormal glucose: Secondary | ICD-10-CM | POA: Diagnosis not present

## 2018-05-25 DIAGNOSIS — R8290 Unspecified abnormal findings in urine: Secondary | ICD-10-CM | POA: Diagnosis not present

## 2018-05-25 DIAGNOSIS — I10 Essential (primary) hypertension: Secondary | ICD-10-CM | POA: Diagnosis not present

## 2018-05-25 LAB — POCT URINALYSIS DIPSTICK
Bilirubin, UA: NEGATIVE
Blood, UA: NEGATIVE
Glucose, UA: NEGATIVE
Ketones, UA: NEGATIVE
Nitrite, UA: NEGATIVE
Protein, UA: NEGATIVE
Spec Grav, UA: 1.025 (ref 1.010–1.025)
Urobilinogen, UA: 0.2 E.U./dL
pH, UA: 6 (ref 5.0–8.0)

## 2018-05-25 LAB — POCT UA - MICROALBUMIN
Albumin/Creatinine Ratio, Urine, POC: <30
Creatinine, POC: 200 mg/dL
Microalbumin Ur, POC: 30 mg/L

## 2018-05-25 NOTE — Addendum Note (Signed)
Addended by: Michelle Nasuti on: 05/25/2018 04:07 PM   Modules accepted: Orders

## 2018-05-26 LAB — CBC WITH DIFFERENTIAL/PLATELET
Basophils Absolute: 0.1 10*3/uL (ref 0.0–0.2)
Basos: 1 %
EOS (ABSOLUTE): 0.1 10*3/uL (ref 0.0–0.4)
Eos: 2 %
Hematocrit: 39.7 % (ref 34.0–46.6)
Hemoglobin: 13.5 g/dL (ref 11.1–15.9)
Immature Grans (Abs): 0 10*3/uL (ref 0.0–0.1)
Immature Granulocytes: 0 %
Lymphocytes Absolute: 1.5 10*3/uL (ref 0.7–3.1)
Lymphs: 25 %
MCH: 30.8 pg (ref 26.6–33.0)
MCHC: 34 g/dL (ref 31.5–35.7)
MCV: 91 fL (ref 79–97)
Monocytes Absolute: 0.4 10*3/uL (ref 0.1–0.9)
Monocytes: 6 %
Neutrophils Absolute: 4.1 10*3/uL (ref 1.4–7.0)
Neutrophils: 66 %
Platelets: 298 10*3/uL (ref 150–450)
RBC: 4.38 x10E6/uL (ref 3.77–5.28)
RDW: 13.3 % (ref 11.7–15.4)
WBC: 6.1 10*3/uL (ref 3.4–10.8)

## 2018-05-26 LAB — CMP14+EGFR
ALT: 18 IU/L (ref 0–32)
AST: 20 IU/L (ref 0–40)
Albumin/Globulin Ratio: 1.9 (ref 1.2–2.2)
Albumin: 4.2 g/dL (ref 3.7–4.7)
Alkaline Phosphatase: 74 IU/L (ref 39–117)
BUN/Creatinine Ratio: 22 (ref 12–28)
BUN: 17 mg/dL (ref 8–27)
Bilirubin Total: 0.8 mg/dL (ref 0.0–1.2)
CO2: 23 mmol/L (ref 20–29)
Calcium: 9.8 mg/dL (ref 8.7–10.3)
Chloride: 105 mmol/L (ref 96–106)
Creatinine, Ser: 0.76 mg/dL (ref 0.57–1.00)
GFR calc Af Amer: 89 mL/min/{1.73_m2} (ref >59)
GFR calc non Af Amer: 78 mL/min/{1.73_m2} (ref >59)
Globulin, Total: 2.2 g/dL (ref 1.5–4.5)
Glucose: 105 mg/dL — ABNORMAL HIGH (ref 65–99)
Potassium: 4.2 mmol/L (ref 3.5–5.2)
Sodium: 142 mmol/L (ref 134–144)
Total Protein: 6.4 g/dL (ref 6.0–8.5)

## 2018-05-26 LAB — LIPID PANEL
Chol/HDL Ratio: 3.7 ratio (ref 0.0–4.4)
Cholesterol, Total: 207 mg/dL — ABNORMAL HIGH (ref 100–199)
HDL: 56 mg/dL (ref >39)
LDL Calculated: 124 mg/dL — ABNORMAL HIGH (ref 0–99)
Triglycerides: 134 mg/dL (ref 0–149)
VLDL Cholesterol Cal: 27 mg/dL (ref 5–40)

## 2018-05-26 LAB — HEMOGLOBIN A1C
Est. average glucose Bld gHb Est-mCnc: 120 mg/dL
Hgb A1c MFr Bld: 5.8 % — ABNORMAL HIGH (ref 4.8–5.6)

## 2018-05-27 LAB — URINE CULTURE

## 2018-05-27 LAB — SPECIMEN STATUS REPORT

## 2018-05-29 ENCOUNTER — Other Ambulatory Visit: Payer: Self-pay | Admitting: Internal Medicine

## 2018-05-29 MED ORDER — CEPHALEXIN 500 MG PO CAPS: 500.0000 mg | ORAL_CAPSULE | Freq: Two times a day (BID) | ORAL | 0 refills | Status: AC

## 2018-06-29 ENCOUNTER — Other Ambulatory Visit: Payer: Self-pay | Admitting: Internal Medicine

## 2018-07-27 DIAGNOSIS — H25013 Cortical age-related cataract, bilateral: Secondary | ICD-10-CM | POA: Diagnosis not present

## 2018-07-27 DIAGNOSIS — H401131 Primary open-angle glaucoma, bilateral, mild stage: Secondary | ICD-10-CM | POA: Diagnosis not present

## 2018-07-27 DIAGNOSIS — H2513 Age-related nuclear cataract, bilateral: Secondary | ICD-10-CM | POA: Diagnosis not present

## 2018-07-28 ENCOUNTER — Other Ambulatory Visit: Payer: Self-pay | Admitting: Internal Medicine

## 2018-09-20 ENCOUNTER — Other Ambulatory Visit: Payer: Self-pay | Admitting: Internal Medicine

## 2018-09-20 DIAGNOSIS — Z1231 Encounter for screening mammogram for malignant neoplasm of breast: Secondary | ICD-10-CM

## 2018-11-01 ENCOUNTER — Ambulatory Visit: Payer: Medicare Other

## 2018-11-06 ENCOUNTER — Other Ambulatory Visit: Payer: Self-pay | Admitting: Internal Medicine

## 2018-11-20 ENCOUNTER — Other Ambulatory Visit: Payer: Self-pay

## 2018-11-20 ENCOUNTER — Encounter: Payer: Self-pay | Admitting: Internal Medicine

## 2018-11-20 ENCOUNTER — Ambulatory Visit (INDEPENDENT_AMBULATORY_CARE_PROVIDER_SITE_OTHER): Payer: Medicare Other | Admitting: Internal Medicine

## 2018-11-20 VITALS — BP 124/86 | HR 68 | Temp 98.3°F | Ht 63.0 in | Wt 163.2 lb

## 2018-11-20 DIAGNOSIS — E559 Vitamin D deficiency, unspecified: Secondary | ICD-10-CM | POA: Diagnosis not present

## 2018-11-20 DIAGNOSIS — E7800 Pure hypercholesterolemia, unspecified: Secondary | ICD-10-CM | POA: Diagnosis not present

## 2018-11-20 DIAGNOSIS — Z6828 Body mass index (BMI) 28.0-28.9, adult: Secondary | ICD-10-CM | POA: Diagnosis not present

## 2018-11-20 DIAGNOSIS — Z23 Encounter for immunization: Secondary | ICD-10-CM

## 2018-11-20 DIAGNOSIS — Z79899 Other long term (current) drug therapy: Secondary | ICD-10-CM

## 2018-11-20 DIAGNOSIS — E663 Overweight: Secondary | ICD-10-CM | POA: Diagnosis not present

## 2018-11-20 DIAGNOSIS — I10 Essential (primary) hypertension: Secondary | ICD-10-CM | POA: Diagnosis not present

## 2018-11-20 NOTE — Progress Notes (Signed)
Subjective:     Patient ID: Marissa Orozco , female    DOB: [birth year] , 73 y.o.   MRN: [id number]   Chief Complaint  Patient presents with  . Hypertension    HPI  Hypertension This is a chronic problem. The current episode started more than 1 year ago. The problem is controlled. Pertinent negatives include no blurred vision, chest pain, palpitations or shortness of breath. Risk factors for coronary artery disease include dyslipidemia and post-menopausal state. Past treatments include ACE inhibitors. The current treatment provides moderate improvement.     Past Medical History:  Diagnosis Date  . Breast lump   . Colon polyps   . Fibroids   . GERD (gastroesophageal reflux disease)   . Glaucoma   . Hyperlipidemia 1986  . Hypertension 1986  . Interstitial cystitis 1999  . Kidney stones 2005  . Osteoporosis   . Tonsillitis      Family History  Problem Relation Age of Onset  . Coronary artery disease Father        mother, PGM  . Stroke Father   . Hypertension Father   . Colon cancer Father 70  . Hypertension Mother   . Heart disease Mother   . Colon cancer Mother 5  . Diabetes Other   . Colon cancer Maternal Grandmother   . Breast cancer Neg Hx      Current Outpatient Medications:  .  CALCIUM PO, Take 1 tablet by mouth daily., Disp: , Rfl:  .  Cholecalciferol (VITAMIN D PO), Take 1 tablet by mouth daily., Disp: , Rfl:  .  finasteride (PROSCAR) 5 MG tablet, Take 2.5 mg by mouth daily.  , Disp: , Rfl:  .  latanoprost (XALATAN) 0.005 % ophthalmic solution, Place 1 drop into both eyes daily., Disp: , Rfl:  .  lisinopril (ZESTRIL) 10 MG tablet, TAKE 1 TABLET DAILY., Disp: 90 tablet, Rfl: 1 .  pravastatin (PRAVACHOL) 80 MG tablet, TAKE 1 TABLET EVERY DAY, Disp: 90 tablet, Rfl: 1   Allergies  Allergen Reactions  . Hctz [Hydrochlorothiazide] Itching     Review of Systems  Constitutional: Negative.   Eyes: Negative for blurred vision.  Respiratory: Negative.   Negative for shortness of breath.   Cardiovascular: Negative.  Negative for chest pain and palpitations.  Gastrointestinal: Negative.   Neurological: Negative.   Psychiatric/Behavioral: Negative.      Today's Vitals   11/20/18 1019  BP: 124/86  Pulse: 68  Temp: 98.3 F (36.8 C)  TempSrc: Oral  Weight: 163 lb 3.2 oz (74 kg)  Height: '5\' 3"'  (1.6 m)  PainSc: 0-No pain   Body mass index is 28.91 kg/m.   Objective:  Physical Exam Vitals signs and nursing note reviewed.  Constitutional:      Appearance: Normal appearance.  HENT:     Head: Normocephalic and atraumatic.  Cardiovascular:     Rate and Rhythm: Normal rate and regular rhythm.     Heart sounds: Normal heart sounds.  Pulmonary:     Effort: Pulmonary effort is normal.     Breath sounds: Normal breath sounds.  Skin:    General: Skin is warm.  Neurological:     General: No focal deficit present.     Mental Status: She is alert.  Psychiatric:        Mood and Affect: Mood normal.        Behavior: Behavior normal.         Assessment And Plan:     1. Essential  hypertension, benign  Chronic, well controlled. She will continue with current meds. She is encouraged to avoid adding salt to her foods. She will rto in six months for re-evaluation.   - BMP8+EGFR - TSH  2. Pure hypercholesterolemia  Chronic, this has been controlled. I will check fasting lipid panel today. She is encouraged to exercise on less than five days weekly for at least 30 minutes.   - Lipid panel  3. Vitamin D deficiency disease  I WILL CHECK A VIT D LEVEL AND SUPPLEMENT AS NEEDED.  ALSO ENCOURAGED TO SPEND 15 MINUTES IN THE SUN DAILY.  - Vitamin D (25 hydroxy)  4. Overweight with body mass index (BMI) of 28 to 28.9 in adult  She is encouraged to strive for BMI less than 25 to decrease cardiac risk.   5. Need for vaccination  - Flu vaccine HIGH DOSE PF (Fluzone High dose)  6. Drug therapy  - Liver Profile    Maximino Greenland,  MD    THE PATIENT IS ENCOURAGED TO PRACTICE SOCIAL DISTANCING DUE TO THE COVID-19 PANDEMIC.

## 2018-11-21 LAB — BMP8+EGFR
BUN/Creatinine Ratio: 16 (ref 12–28)
BUN: 13 mg/dL (ref 8–27)
CO2: 23 mmol/L (ref 20–29)
Calcium: 9.8 mg/dL (ref 8.7–10.3)
Chloride: 107 mmol/L — ABNORMAL HIGH (ref 96–106)
Creatinine, Ser: 0.83 mg/dL (ref 0.57–1.00)
GFR calc Af Amer: 80 mL/min/{1.73_m2} (ref >59)
GFR calc non Af Amer: 70 mL/min/{1.73_m2} (ref >59)
Glucose: 98 mg/dL (ref 65–99)
Potassium: 4.6 mmol/L (ref 3.5–5.2)
Sodium: 141 mmol/L (ref 134–144)

## 2018-11-21 LAB — TSH: TSH: 2.44 u[IU]/mL (ref 0.450–4.500)

## 2018-11-21 LAB — LIPID PANEL
Chol/HDL Ratio: 3.6 ratio (ref 0.0–4.4)
Cholesterol, Total: 229 mg/dL — ABNORMAL HIGH (ref 100–199)
HDL: 64 mg/dL (ref >39)
LDL Chol Calc (NIH): 140 mg/dL — ABNORMAL HIGH (ref 0–99)
Triglycerides: 140 mg/dL (ref 0–149)
VLDL Cholesterol Cal: 25 mg/dL (ref 5–40)

## 2018-11-21 LAB — HEPATIC FUNCTION PANEL
ALT: 23 IU/L (ref 0–32)
AST: 26 IU/L (ref 0–40)
Albumin: 4.1 g/dL (ref 3.7–4.7)
Alkaline Phosphatase: 80 IU/L (ref 39–117)
Bilirubin Total: 1 mg/dL (ref 0.0–1.2)
Bilirubin, Direct: 0.22 mg/dL (ref 0.00–0.40)
Total Protein: 6.3 g/dL (ref 6.0–8.5)

## 2018-11-21 LAB — VITAMIN D 25 HYDROXY (VIT D DEFICIENCY, FRACTURES): Vit D, 25-Hydroxy: 48.2 ng/mL (ref 30.0–100.0)

## 2018-11-28 ENCOUNTER — Encounter: Payer: Self-pay | Admitting: Internal Medicine

## 2018-12-08 ENCOUNTER — Other Ambulatory Visit: Payer: Self-pay

## 2018-12-08 ENCOUNTER — Ambulatory Visit
Admission: RE | Admit: 2018-12-08 | Discharge: 2018-12-08 | Disposition: A | Discharge status: Home / Self Care | Payer: Medicare Other | Source: Ambulatory Visit | Attending: Internal Medicine | Admitting: Internal Medicine

## 2018-12-08 DIAGNOSIS — Z1231 Encounter for screening mammogram for malignant neoplasm of breast: Secondary | ICD-10-CM

## 2018-12-14 DIAGNOSIS — H524 Presbyopia: Secondary | ICD-10-CM | POA: Diagnosis not present

## 2018-12-14 DIAGNOSIS — H401131 Primary open-angle glaucoma, bilateral, mild stage: Secondary | ICD-10-CM | POA: Diagnosis not present

## 2018-12-14 DIAGNOSIS — H25013 Cortical age-related cataract, bilateral: Secondary | ICD-10-CM | POA: Diagnosis not present

## 2019-01-26 ENCOUNTER — Ambulatory Visit: Payer: Medicare Other | Attending: Internal Medicine

## 2019-01-26 DIAGNOSIS — Z23 Encounter for immunization: Secondary | ICD-10-CM

## 2019-01-27 ENCOUNTER — Ambulatory Visit: Payer: Medicare Other

## 2019-01-29 NOTE — Progress Notes (Signed)
   Covid-19 Vaccination Clinic  Name:  Marissa Orozco    MRN: [id number] DOB: 02/27/1945  01/26/2019  [name] was observed post Covid-19 immunization for 15 minutes without incidence. She was provided with Vaccine Information Sheet and instruction to access the V-Safe system.   [name] was instructed to call 911 with any severe reactions post vaccine: Marland Kitchen Difficulty breathing  . Swelling of your face and throat  . A fast heartbeat  . A bad rash all over your body  . Dizziness and weakness    Immunizations Administered    Name Date Dose VIS Date Route   Moderna COVID-19 Vaccine 01/26/2019 11:33 AM 0.5 mL 12/05/2018 Intramuscular   Manufacturer: Moderna   Lot: [id number]   [id number]

## 2019-02-23 ENCOUNTER — Ambulatory Visit: Payer: Medicare Other

## 2019-03-02 ENCOUNTER — Ambulatory Visit: Payer: Medicare Other | Attending: Internal Medicine

## 2019-03-02 DIAGNOSIS — Z23 Encounter for immunization: Secondary | ICD-10-CM

## 2019-03-02 NOTE — Progress Notes (Signed)
   Covid-19 Vaccination Clinic  Name:  Marissa Orozco    MRN: [id number] DOB: May 04, 1945  03/02/2019  Ms. [name] was observed post Covid-19 immunization for 15 minutes without incidence. She was provided with Vaccine Information Sheet and instruction to access the V-Safe system.   Ms. [name] was instructed to call 911 with any severe reactions post vaccine: Marland Kitchen Difficulty breathing  . Swelling of your face and throat  . A fast heartbeat  . A bad rash all over your body  . Dizziness and weakness    Immunizations Administered    Name Date Dose VIS Date Route   Moderna COVID-19 Vaccine 03/02/2019  8:46 AM 0.5 mL 12/05/2018 Intramuscular   Manufacturer: Moderna   Lot: [id number]   [id number]

## 2019-03-27 ENCOUNTER — Encounter: Payer: Self-pay | Admitting: Internal Medicine

## 2019-03-28 ENCOUNTER — Other Ambulatory Visit: Payer: Self-pay

## 2019-03-28 ENCOUNTER — Encounter: Payer: Self-pay | Admitting: Internal Medicine

## 2019-03-28 ENCOUNTER — Ambulatory Visit (INDEPENDENT_AMBULATORY_CARE_PROVIDER_SITE_OTHER): Payer: Medicare Other | Admitting: Internal Medicine

## 2019-03-28 VITALS — BP 158/86 | HR 73 | Temp 98.2°F | Ht 63.0 in | Wt 164.8 lb

## 2019-03-28 DIAGNOSIS — I10 Essential (primary) hypertension: Secondary | ICD-10-CM

## 2019-03-28 DIAGNOSIS — R0683 Snoring: Secondary | ICD-10-CM

## 2019-03-28 MED ORDER — AMLODIPINE BESYLATE 2.5 MG PO TABS: 2.5000 mg | ORAL_TABLET | Freq: Every day | ORAL | 1 refills | Status: DC

## 2019-03-28 NOTE — Progress Notes (Signed)
This visit occurred during the SARS-CoV-2 public health emergency.  Safety protocols were in place, including screening questions prior to the visit, additional usage of staff PPE, and extensive cleaning of exam room while observing appropriate contact time as indicated for disinfecting solutions.  Subjective:     Patient ID: Marissa Orozco , female    DOB: September 25, 1945 , 74 y.o.   MRN: [id number]   Chief Complaint  Patient presents with  . Hypertension    HPI  She is here today for evaluation of elevated bp. She has h/o HTN, reports compliance with meds. She reports she checked her BP recently and it was 160s/100s. She has been feeling well. She admits that she does not check her blood pressure on a regular basis. Denies headaches, chest pain, palpitations and visual disturbance.  She denies change in diet/sleep habits. She has been exercising regularly. She reports she checked her BP only b/c she had recently checked her husband's BP. She was shocked to see the high reading. She called for appt when she had repeated elevated readings.   Hypertension This is a chronic problem. The current episode started more than 1 year ago. The problem has been gradually worsening since onset. The problem is uncontrolled. Pertinent negatives include no blurred vision, chest pain, palpitations or shortness of breath. The current treatment provides mild improvement.     Past Medical History:  Diagnosis Date  . Breast lump   . Colon polyps   . Fibroids   . GERD (gastroesophageal reflux disease)   . Glaucoma   . Hyperlipidemia 1986  . Hypertension 1986  . Interstitial cystitis 1999  . Kidney stones 2005  . Osteoporosis   . Tonsillitis      Family History  Problem Relation Age of Onset  . Coronary artery disease Father        mother, PGM  . Stroke Father   . Hypertension Father   . Colon cancer Father 21  . Hypertension Mother   . Heart disease Mother   . Colon cancer Mother 73  . Diabetes  Other   . Colon cancer Maternal Grandmother   . Breast cancer Neg Hx      Current Outpatient Medications:  .  CALCIUM PO, Take 1 tablet by mouth daily., Disp: , Rfl:  .  Cholecalciferol (VITAMIN D PO), Take 1 tablet by mouth daily. 2000 units daily, Disp: , Rfl:  .  finasteride (PROSCAR) 5 MG tablet, Take 2.5 mg by mouth daily.  , Disp: , Rfl:  .  latanoprost (XALATAN) 0.005 % ophthalmic solution, Place 1 drop into both eyes daily., Disp: , Rfl:  .  lisinopril (ZESTRIL) 10 MG tablet, TAKE 1 TABLET DAILY., Disp: 90 tablet, Rfl: 1 .  pravastatin (PRAVACHOL) 80 MG tablet, TAKE 1 TABLET EVERY DAY, Disp: 90 tablet, Rfl: 1   Allergies  Allergen Reactions  . Hctz [Hydrochlorothiazide] Itching     Review of Systems  Constitutional: Negative.   Eyes: Negative for blurred vision.  Respiratory: Negative.  Negative for shortness of breath.   Cardiovascular: Negative.  Negative for chest pain and palpitations.  Gastrointestinal: Negative.   Neurological: Negative.   Psychiatric/Behavioral: Negative.      Today's Vitals   03/28/19 1027  BP: (!) 158/86  Pulse: 73  Temp: 98.2 F (36.8 C)  TempSrc: Oral  Weight: 164 lb 12.8 oz (74.8 kg)  Height: '5\' 3"'  (1.6 m)  PainSc: 0-No pain   Body mass index is 29.19 kg/m.   BP  Readings from Last 3 Encounters:  03/28/19 (!) 158/86  11/20/18 124/86  05/18/18 130/88   Objective:  Physical Exam Vitals and nursing note reviewed.  Constitutional:      Appearance: Normal appearance.  HENT:     Head: Normocephalic and atraumatic.  Cardiovascular:     Rate and Rhythm: Normal rate and regular rhythm.     Heart sounds: Normal heart sounds.  Pulmonary:     Effort: Pulmonary effort is normal.     Breath sounds: Normal breath sounds.  Skin:    General: Skin is warm.  Neurological:     General: No focal deficit present.     Mental Status: She is alert.  Psychiatric:        Mood and Affect: Mood normal.        Behavior: Behavior normal.          Assessment And Plan:   1. Essential hypertension, benign  Uncontrolled. I will check renal function today. I will also add amlodipine 2.57m nightly to her current regimen. She will start taking lisinopril in the mornings. She will rto in 2 weeks for a nurse visit, and I will see her in 4-6 weeks. If needed, I will increase the amlodipine at her next visit. She is reminded to avoid adding salt to her foods. She is advised to check BP and keep log. She is to email the readings to me once weekly.   - BMP8+EGFR  2. Snoring  She agrees to Neuro eval for OSA.    RMaximino Greenland MD    THE PATIENT IS ENCOURAGED TO PRACTICE SOCIAL DISTANCING DUE TO THE COVID-19 PANDEMIC.

## 2019-03-29 LAB — BMP8+EGFR
BUN/Creatinine Ratio: 18 (ref 12–28)
BUN: 14 mg/dL (ref 8–27)
CO2: 23 mmol/L (ref 20–29)
Calcium: 9.7 mg/dL (ref 8.7–10.3)
Chloride: 108 mmol/L — ABNORMAL HIGH (ref 96–106)
Creatinine, Ser: 0.76 mg/dL (ref 0.57–1.00)
GFR calc Af Amer: 89 mL/min/{1.73_m2} (ref >59)
GFR calc non Af Amer: 77 mL/min/{1.73_m2} (ref >59)
Glucose: 97 mg/dL (ref 65–99)
Potassium: 4.3 mmol/L (ref 3.5–5.2)
Sodium: 143 mmol/L (ref 134–144)

## 2019-04-02 ENCOUNTER — Other Ambulatory Visit: Payer: Self-pay | Admitting: Internal Medicine

## 2019-04-02 ENCOUNTER — Encounter: Payer: Self-pay | Admitting: Internal Medicine

## 2019-04-02 DIAGNOSIS — I10 Essential (primary) hypertension: Secondary | ICD-10-CM

## 2019-04-02 NOTE — Progress Notes (Signed)
renal

## 2019-04-10 ENCOUNTER — Other Ambulatory Visit: Payer: Self-pay

## 2019-04-10 ENCOUNTER — Ambulatory Visit (HOSPITAL_COMMUNITY)
Admission: RE | Admit: 2019-04-10 | Discharge: 2019-04-10 | Disposition: A | Discharge status: Home / Self Care | Payer: Medicare Other | Source: Ambulatory Visit | Attending: Cardiology | Admitting: Cardiology

## 2019-04-10 DIAGNOSIS — I10 Essential (primary) hypertension: Secondary | ICD-10-CM | POA: Diagnosis not present

## 2019-04-15 ENCOUNTER — Other Ambulatory Visit: Payer: Self-pay | Admitting: Internal Medicine

## 2019-04-15 DIAGNOSIS — D179 Benign lipomatous neoplasm, unspecified: Secondary | ICD-10-CM

## 2019-04-16 ENCOUNTER — Ambulatory Visit (INDEPENDENT_AMBULATORY_CARE_PROVIDER_SITE_OTHER): Payer: Medicare Other | Admitting: Neurology

## 2019-04-16 ENCOUNTER — Encounter: Payer: Self-pay | Admitting: Neurology

## 2019-04-16 ENCOUNTER — Other Ambulatory Visit: Payer: Self-pay

## 2019-04-16 VITALS — BP 176/93 | HR 84 | Temp 97.5°F | Ht 63.5 in | Wt 164.0 lb

## 2019-04-16 DIAGNOSIS — E663 Overweight: Secondary | ICD-10-CM

## 2019-04-16 DIAGNOSIS — R030 Elevated blood-pressure reading, without diagnosis of hypertension: Secondary | ICD-10-CM

## 2019-04-16 DIAGNOSIS — R351 Nocturia: Secondary | ICD-10-CM

## 2019-04-16 DIAGNOSIS — R0683 Snoring: Secondary | ICD-10-CM | POA: Diagnosis not present

## 2019-04-16 NOTE — Progress Notes (Signed)
Subjective:    Patient ID: Marissa Orozco is a 74 y.o. female.  HPI     Star Age, MD, PhD [facility] [address], [address] [address] [place],  [address]  Dear Dr. Baird Cancer,  I saw your patient, Marissa Orozco, upon your kind request in my sleep clinic today for initial consultation of her sleep disorder, in particular, concern for underlying obstructive sleep apnea.  Patient is unaccompanied today.  As you know, Ms. Merkling is a 74 year old right-handed woman with an underlying medical history of reflux disease, hypertension, hyperlipidemia, kidney stones, osteoporosis, interstitial cystitis, glaucoma, and overweight state, who reports snoring, sleep disruption and elevated BP values recently.  I reviewed your office note from 03/28/2019.  She was started on amlodipine and is now taking 5 mg daily. Her Epworth sleepiness score is 6/24, fatigue severity score is 9 out of 63.  She is retired, worked in Engineer, technical sales, she lives with her husband.  Bedtime is between 10 and 11 and rise time around 7 or 8.  She typically wakes up around 7 and gets out of bed around 8.  They have a dog in the household, does not sleep on the bed with them.  She does have a TV on at night and falls asleep with the TV on and her husband typically turns it off.  She is a non-smoker and drinks alcohol in the form of wine, about 1 glass 5 days out of the week.  She drinks caffeine in the form of sweet tea, about 4 to 5 glasses/day on average.  She has 2 grown kids and 2 grandkids.  She has no family history of sleep apnea.  She denies any telltale symptoms of restless legs syndrome.  Her home blood pressure values currently are a little better since starting the amlodipine between 140 and [id number] for the systolic and 0000000 for the diastolic typically.  She had a tonsillectomy as a child.  Weight has been more or less stable, gained a few pounds in the past year.  She denies any recurrent morning headaches but  has significant nocturia about 3-5 times per average night.   Her Past Medical History Is Significant For: Past Medical History:  Diagnosis Date  . Breast lump   . Colon polyps   . Fibroids   . GERD (gastroesophageal reflux disease)   . Glaucoma   . Hyperlipidemia 1986  . Hypertension 1986  . Interstitial cystitis 1999  . Kidney stones 2005  . Osteoporosis   . Tonsillitis     Her Past Surgical History Is Significant For: Past Surgical History:  Procedure Laterality Date  . BREAST EXCISIONAL BIOPSY Right 2013?   Benign  . BREAST LUMPECTOMY Right 2008   right breast; benign  . COLONOSCOPY  2010   Dr Collene Mares  . HYSTEROSCOPY  1993  . LAPAROSCOPIC HYSTERECTOMY  1994  . MYOMECTOMY  1993   excision of tumor  . POLYPECTOMY    . TONSILLECTOMY  1953    Her Family History Is Significant For: Family History  Problem Relation Age of Onset  . Coronary artery disease Father        mother, PGM  . Stroke Father   . Hypertension Father   . Colon cancer Father 67  . Hypertension Mother   . Heart disease Mother   . Colon cancer Mother 54  . Diabetes Other   . Colon cancer Maternal Grandmother   . Breast cancer Neg Hx  Her Social History Is Significant For: Social History   Socioeconomic History  . Marital status: Married    Spouse name: Not on file  . Number of children: 2  . Years of education: Not on file  . Highest education level: Not on file  Occupational History  . Occupation: retired  Tobacco Use  . Smoking status: Never Smoker  . Smokeless tobacco: Never Used  Substance and Sexual Activity  . Alcohol use: Yes    Alcohol/week: 7.0 standard drinks    Types: 7 Glasses of wine per week  . Drug use: No  . Sexual activity: Not Currently  Other Topics Concern  . Not on file  Social History Narrative  . Not on file   Social Determinants of Health   Financial Resource Strain: Low Risk   . Difficulty of Paying Living Expenses: Not hard at all  Food  Insecurity: No Food Insecurity  . Worried About Charity fundraiser in the Last Year: Never true  . Ran Out of Food in the Last Year: Never true  Transportation Needs: No Transportation Needs  . Lack of Transportation (Medical): No  . Lack of Transportation (Non-Medical): No  Physical Activity: Inactive  . Days of Exercise per Week: 0 days  . Minutes of Exercise per Session: 0 min  Stress:   . Feeling of Stress :   Social Connections:   . Frequency of Communication with Friends and Family:   . Frequency of Social Gatherings with Friends and Family:   . Attends Religious Services:   . Active Member of Clubs or Organizations:   . Attends Archivist Meetings:   Marland Kitchen Marital Status:     Her Allergies Are:  Allergies  Allergen Reactions  . Hctz [Hydrochlorothiazide] Itching  :   Her Current Medications Are:  Outpatient Encounter Medications as of 04/16/2019  Medication Sig  . amLODipine (NORVASC) 2.5 MG tablet Take 1 tablet (2.5 mg total) by mouth daily.  Marland Kitchen CALCIUM PO Take 1 tablet by mouth daily.  . Cholecalciferol (VITAMIN D PO) Take 1 tablet by mouth daily. 2000 units daily  . finasteride (PROSCAR) 5 MG tablet Take 2.5 mg by mouth daily.    Marland Kitchen latanoprost (XALATAN) 0.005 % ophthalmic solution Place 1 drop into both eyes daily.  Marland Kitchen lisinopril (ZESTRIL) 10 MG tablet TAKE 1 TABLET DAILY.  . pravastatin (PRAVACHOL) 80 MG tablet TAKE 1 TABLET EVERY DAY   No facility-administered encounter medications on file as of 04/16/2019.  :  Review of Systems:  Out of a complete 14 point review of systems, all are reviewed and negative with the exception of these symptoms as listed below:  Review of Systems  Neurological:       Pt presents today to discuss her sleep. Pt has never had a sleep study. Pt does endorse snoring.  Epworth Sleepiness Scale 0= would never doze 1= slight chance of dozing 2= moderate chance of dozing 3= high chance of dozing  Sitting and reading:  1 Watching TV: 0 Sitting inactive in a public place (ex. Theater or meeting): 0 As a passenger in a car for an hour without a break: 3 Lying down to rest in the afternoon: 2 Sitting and talking to someone: 0 Sitting quietly after lunch (no alcohol): 0 In a car, while stopped in traffic: 0 Total: 6     Objective:  Neurological Exam  Physical Exam Physical Examination:   Vitals:   04/16/19 0748  BP: (!) 176/93  Pulse:  84  Temp: (!) 97.5 F (36.4 C)   General Examination: The patient is a very pleasant 74 y.o. female in no acute distress. She appears well-developed and well-nourished and well groomed.   HEENT: Normocephalic, atraumatic, pupils are equal, round and reactive to light, extraocular tracking is good without limitation to gaze excursion or nystagmus noted. Hearing is grossly intact. Face is symmetric with normal facial animation. Speech is clear with no dysarthria noted. There is no hypophonia. There is no lip, neck/head, jaw or voice tremor. Neck is supple with full range of passive and active motion. There are no carotid bruits on auscultation. Oropharynx exam reveals: mild to moderate mouth dryness, good dental hygiene and mild airway crowding, due to small airway entry, redundant soft palate, uvula not fully visualized, Mallampati class III, tonsils absent.  Tongue protrudes centrally in palate elevates symmetrically, neck circumference is 13-1/2 inches.  She has a minimal to mild overbite.  Chest: Clear to auscultation without wheezing, rhonchi or crackles noted.  Heart: S1+S2+0, regular and normal without murmurs, rubs or gallops noted.   Abdomen: Soft, non-tender and non-distended with normal bowel sounds appreciated on auscultation.  Extremities: There is no pitting edema in the distal lower extremities bilaterally.   Skin: Warm and dry without trophic changes noted.   Musculoskeletal: exam reveals no obvious joint deformities, tenderness or joint swelling or  erythema.   Neurologically:  Mental status: The patient is awake, alert and oriented in all 4 spheres. Her immediate and remote memory, attention, language skills and fund of knowledge are appropriate. There is no evidence of aphasia, agnosia, apraxia or anomia. Speech is clear with normal prosody and enunciation. Thought process is linear. Mood is normal and affect is normal.  Cranial nerves II - XII are as described above under HEENT exam.  Motor exam: Normal bulk, strength and tone is noted. There is no tremor, Romberg is negative. Fine motor skills and coordination: grossly intact.  Cerebellar testing: No dysmetria or intention tremor. There is no truncal or gait ataxia.  Sensory exam: intact to light touch in the upper and lower extremities.  Gait, station and balance: She stands easily. No veering to one side is noted. No leaning to one side is noted. Posture is age-appropriate and stance is narrow based. Gait shows normal stride length and normal pace. No problems turning are noted. Tandem walk is difficult for her.                 Assessment and Plan:   In summary, BABLI DERRING is a very pleasant 74 y.o.-year old female with an underlying medical history of reflux disease, hypertension, hyperlipidemia, kidney stones, osteoporosis, interstitial cystitis, glaucoma, and overweight state, whose history and physical exam concerning for obstructive sleep apnea (OSA). I had a long chat with the patient about my findings and the diagnosis of OSA, its prognosis and treatment options. We talked about medical treatments, surgical interventions and non-pharmacological approaches. I explained in particular the risks and ramifications of untreated moderate to severe OSA, especially with respect to developing cardiovascular disease down the Road, including congestive heart failure, difficult to treat hypertension, cardiac arrhythmias, or stroke. Even type 2 diabetes has, in part, been linked to untreated  OSA. Symptoms of untreated OSA include daytime sleepiness, memory problems, mood irritability and mood disorder such as depression and anxiety, lack of energy, as well as recurrent headaches, especially morning headaches. We talked about trying to maintain a healthy lifestyle in general, as well as the importance  of weight control. We also talked about the importance of good sleep hygiene. I recommended the following at this time: sleep study.  I explained the sleep test procedure to the patient and also outlined possible surgical and non-surgical treatment options of OSA, including the use of a custom-made dental device (which would require a referral to a specialist dentist or oral surgeon), upper airway surgical options, such as traditional UPPP or a novel less invasive surgical option in the form of Inspire hypoglossal nerve stimulation (which would involve a referral to an ENT surgeon). I also explained the CPAP treatment option to the patient, who indicated that she would be willing to try CPAP if the need arises. I explained the importance of being compliant with PAP treatment, not only for insurance purposes but primarily to improve Her symptoms, and for the patient's long term health benefit, including to reduce Her cardiovascular risks. I answered all her questions today and the patient was in agreement. I plan to see her back after the sleep study is completed and encouraged her to call with any interim questions, concerns, problems or updates.   Thank you very much for allowing me to participate in the care of this nice patient. If I can be of any further assistance to you please do not hesitate to call me at [phone].  Sincerely,   Star Age, MD, PhD

## 2019-04-16 NOTE — Patient Instructions (Signed)

## 2019-04-17 ENCOUNTER — Ambulatory Visit: Payer: Medicare Other

## 2019-04-17 ENCOUNTER — Other Ambulatory Visit: Payer: Self-pay

## 2019-04-17 VITALS — BP 132/84 | HR 83 | Temp 98.2°F | Ht 63.4 in | Wt 165.2 lb

## 2019-04-17 DIAGNOSIS — I10 Essential (primary) hypertension: Secondary | ICD-10-CM

## 2019-04-17 NOTE — Progress Notes (Signed)
Pt arrived today for b/p check she is currently taking amlodipine 5mg  at night and lisinopril 10mg  a.m  132/84 Provider aware

## 2019-04-18 IMAGING — MG DIGITAL SCREENING BILATERAL MAMMOGRAM WITH TOMO AND CAD
8 series · 8 of 24 positions shown · non-contrast
Comparison: Previous exam(s).

CLINICAL DATA: Screening.

EXAM:
DIGITAL SCREENING BILATERAL MAMMOGRAM WITH TOMO AND CAD

[R MLO synth-2D]
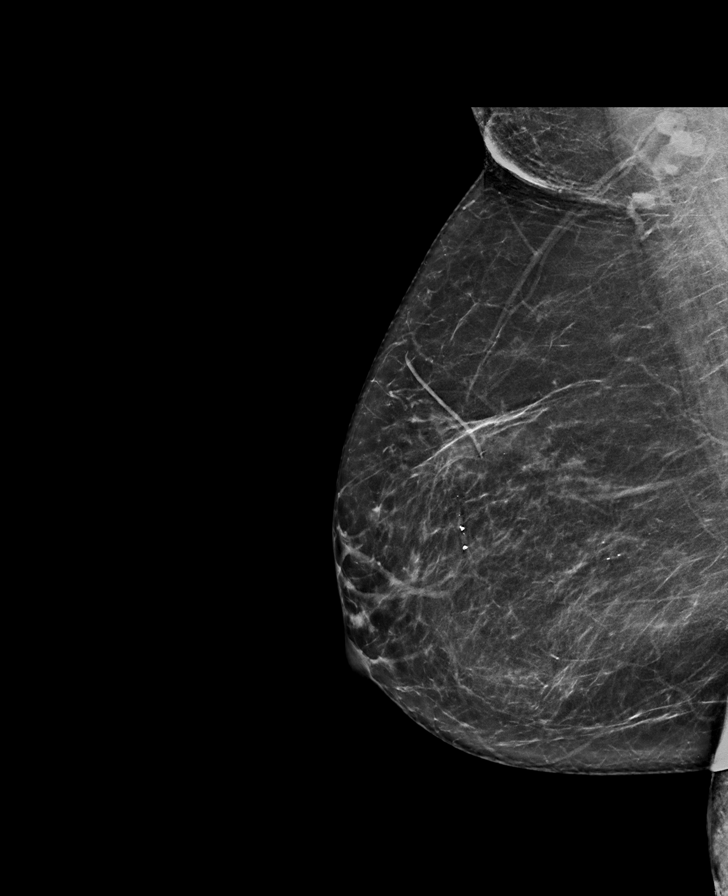

[L MLO synth-2D]
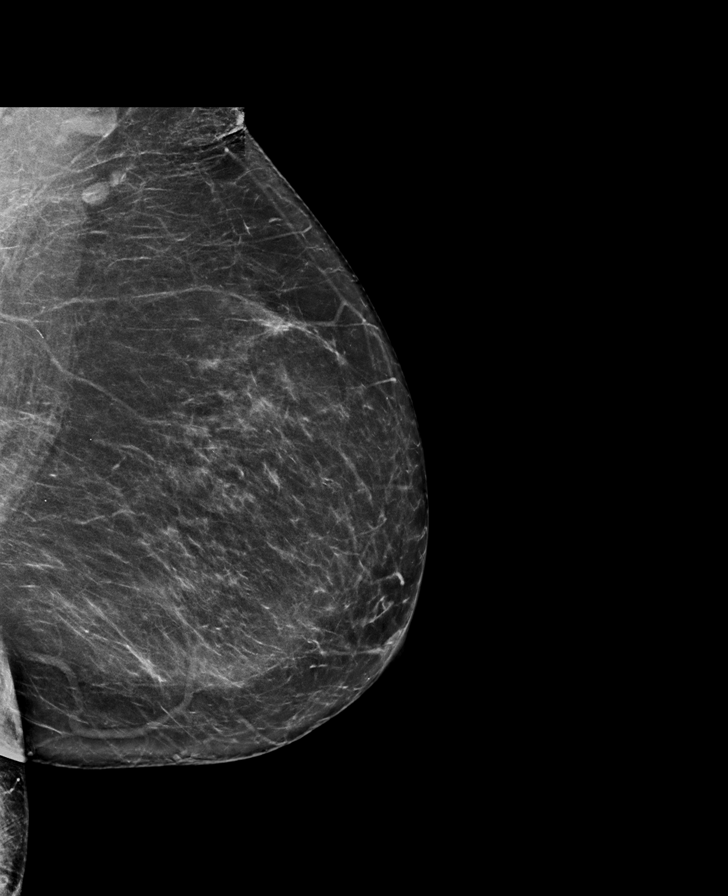

[L CC synth-2D]
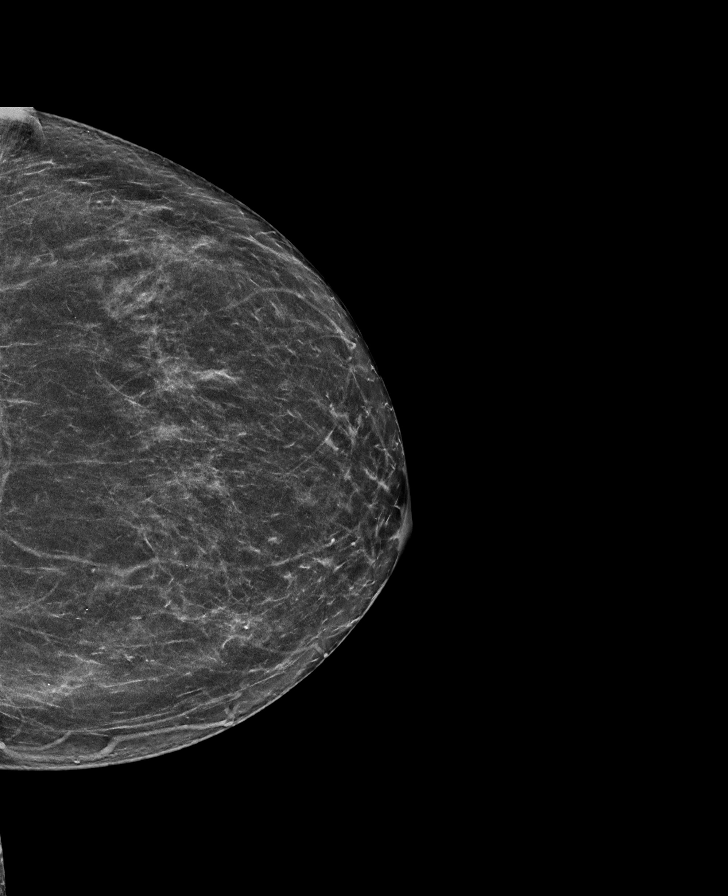

[R CC synth-2D]
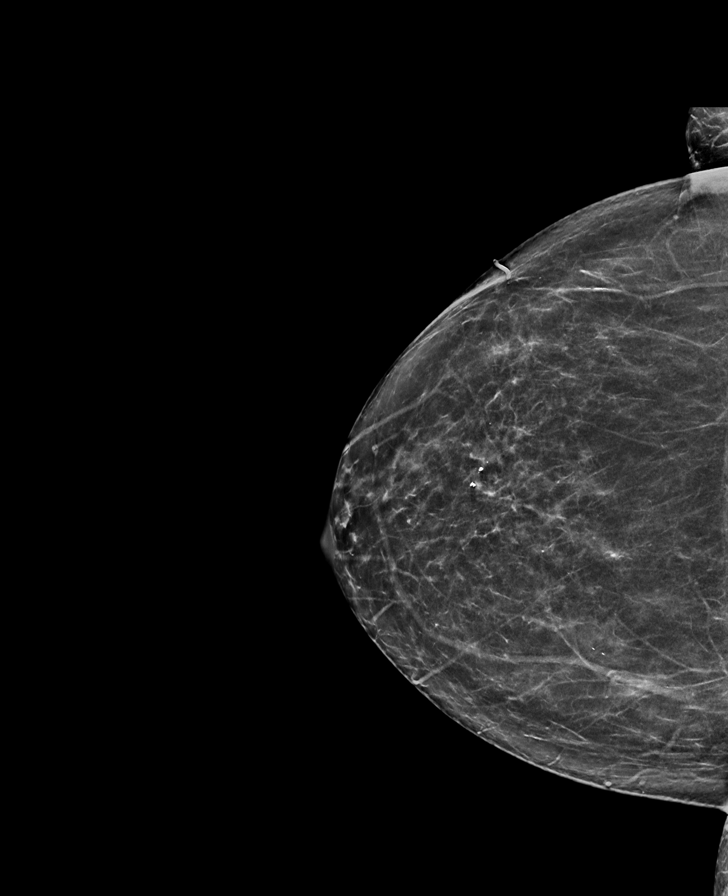

[R MLO tomo · tomo slice 37/73.0]
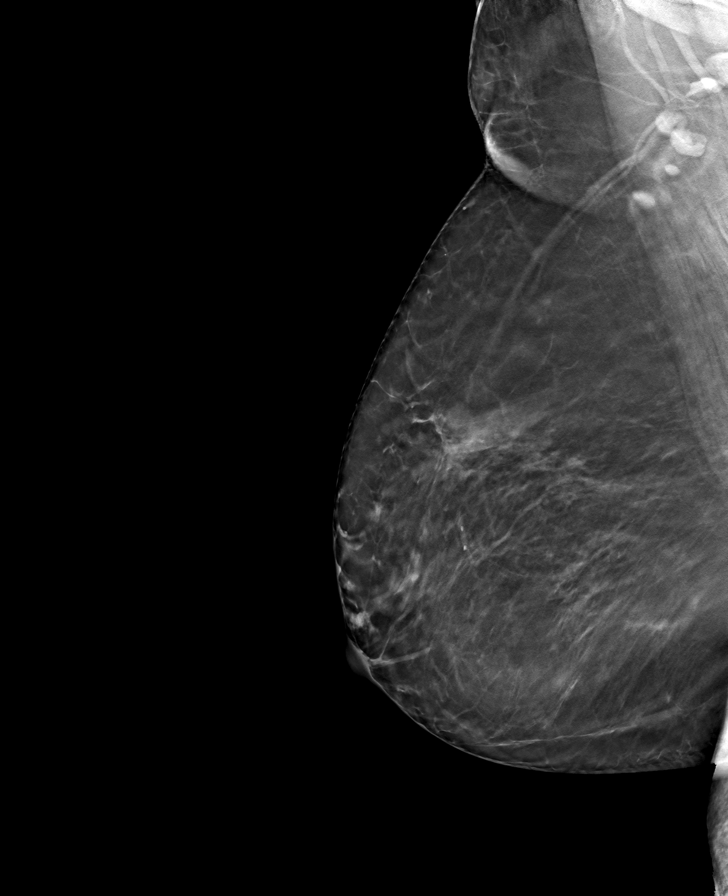

[R CC tomo · tomo slice 35/68.0]
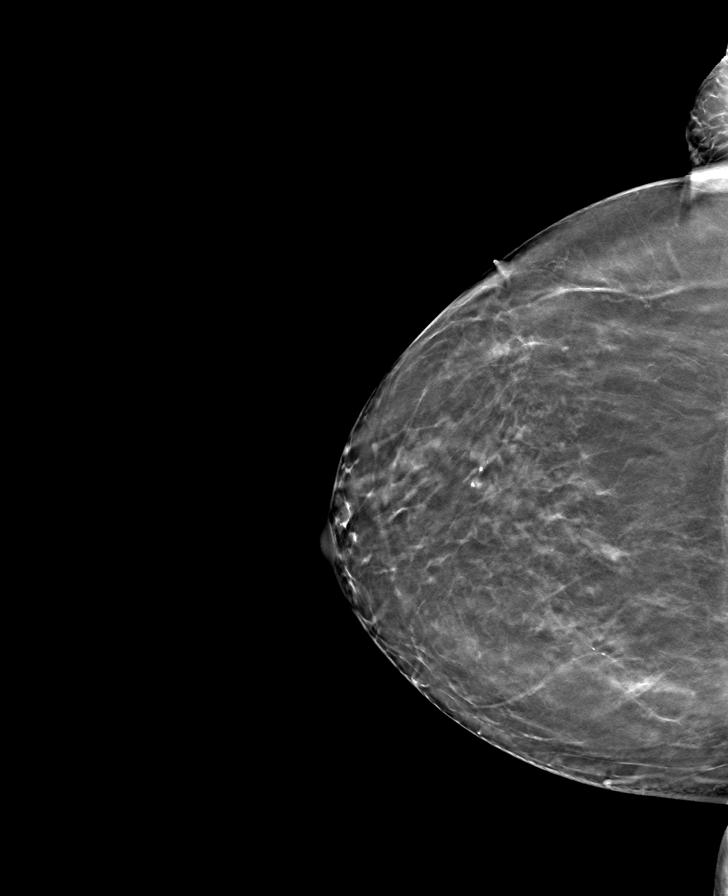

[L CC tomo · tomo slice 34/67.0]
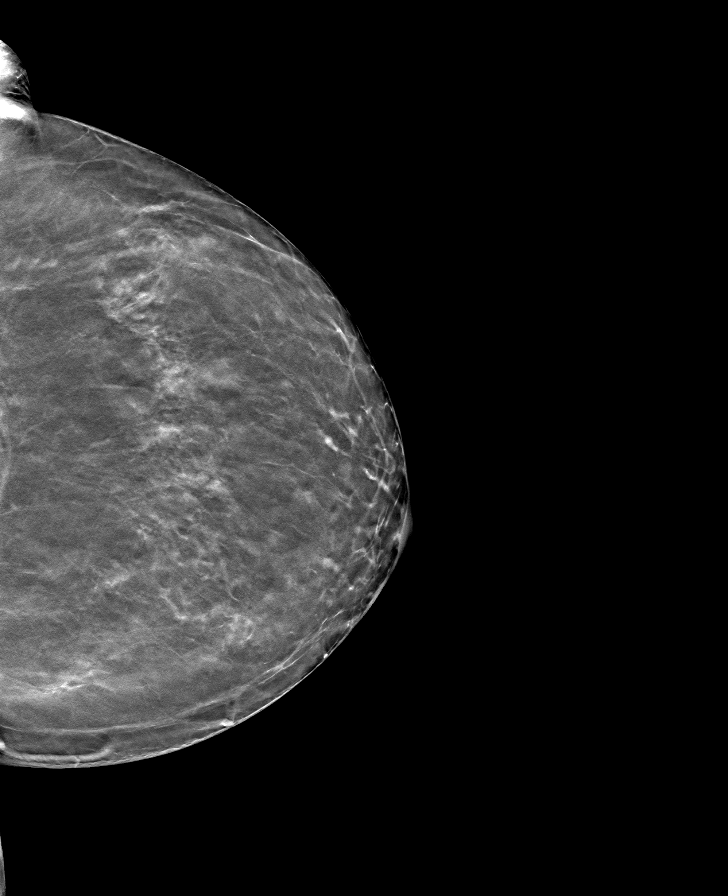

[L MLO tomo · tomo slice 37/74.0]
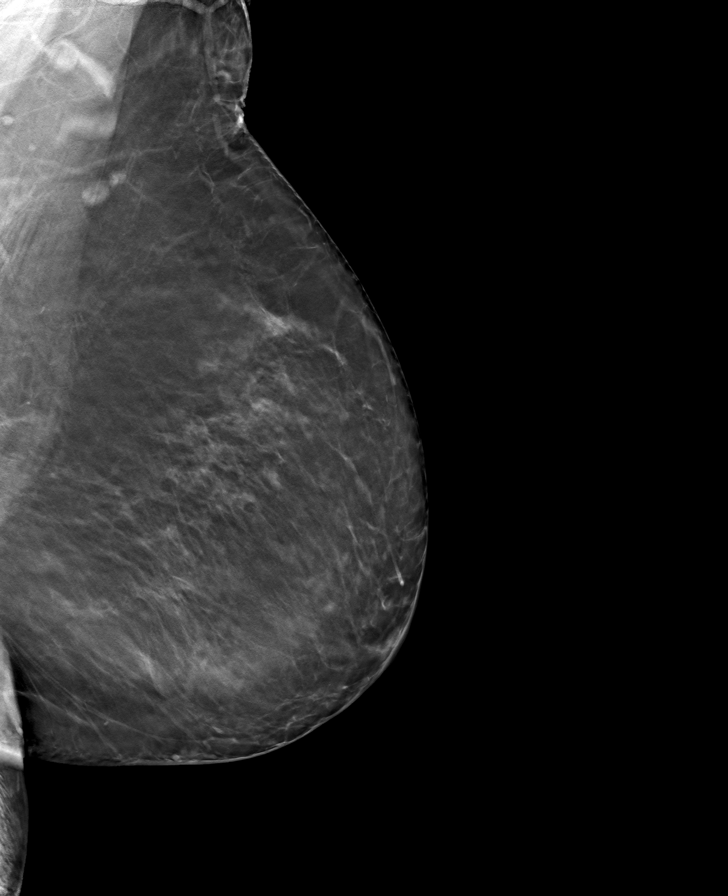

[8 of 24 positions shown; findings below may reference images not displayed]

ACR Breast Density Category b: There are scattered areas of
fibroglandular density.
FINDINGS: There are no findings suspicious for malignancy. Images were
processed with CAD.
IMPRESSION: No mammographic evidence of malignancy. A result letter of this
screening mammogram will be mailed directly to the patient.

RECOMMENDATION:
Screening mammogram in one year. (Code:[id number])

BI-RADS CATEGORY  1: Negative.

## 2019-04-19 ENCOUNTER — Other Ambulatory Visit: Payer: Self-pay | Admitting: Internal Medicine

## 2019-04-19 ENCOUNTER — Ambulatory Visit (HOSPITAL_COMMUNITY): Payer: Medicare Other | Attending: Internal Medicine

## 2019-04-19 ENCOUNTER — Other Ambulatory Visit: Payer: Self-pay

## 2019-04-19 DIAGNOSIS — I10 Essential (primary) hypertension: Secondary | ICD-10-CM

## 2019-05-01 DIAGNOSIS — H2513 Age-related nuclear cataract, bilateral: Secondary | ICD-10-CM | POA: Diagnosis not present

## 2019-05-01 DIAGNOSIS — H25013 Cortical age-related cataract, bilateral: Secondary | ICD-10-CM | POA: Diagnosis not present

## 2019-05-01 DIAGNOSIS — H401131 Primary open-angle glaucoma, bilateral, mild stage: Secondary | ICD-10-CM | POA: Diagnosis not present

## 2019-05-02 ENCOUNTER — Ambulatory Visit (INDEPENDENT_AMBULATORY_CARE_PROVIDER_SITE_OTHER): Payer: Medicare Other | Admitting: Internal Medicine

## 2019-05-02 ENCOUNTER — Other Ambulatory Visit: Payer: Self-pay

## 2019-05-02 ENCOUNTER — Encounter: Payer: Self-pay | Admitting: Internal Medicine

## 2019-05-02 VITALS — BP 124/72 | HR 82 | Temp 98.5°F | Ht 63.4 in | Wt 165.2 lb

## 2019-05-02 DIAGNOSIS — D179 Benign lipomatous neoplasm, unspecified: Secondary | ICD-10-CM

## 2019-05-02 DIAGNOSIS — I10 Essential (primary) hypertension: Secondary | ICD-10-CM | POA: Diagnosis not present

## 2019-05-02 MED ORDER — AMLODIPINE BESYLATE 5 MG PO TABS: 5.0000 mg | ORAL_TABLET | Freq: Every day | ORAL | 2 refills | Status: DC

## 2019-05-02 NOTE — Progress Notes (Signed)
This visit occurred during the SARS-CoV-2 public health emergency.  Safety protocols were in place, including screening questions prior to the visit, additional usage of staff PPE, and extensive cleaning of exam room while observing appropriate contact time as indicated for disinfecting solutions.  Subjective:     Patient ID: Marissa Orozco , female    DOB: Jun 15, 1945 , 74 y.o.   MRN: [id number]   Chief Complaint  Patient presents with  . Hypertension    HPI  She presents for BP check. She reports compliance with meds.  She was started on amlodipine 2.5mg  at her last visit due to elevated blood pressure. Her condition persisted, so dose was increased to 5mg  daily. She reports having improved BP readings. She feels well - she never had h/a, cp or SOB. She still exercises on a regular basis - treadmill and rowing machine.   Hypertension This is a chronic problem. The current episode started more than 1 year ago. The problem has been gradually improving since onset. The problem is controlled. Pertinent negatives include no blurred vision, chest pain, palpitations or shortness of breath.     Past Medical History:  Diagnosis Date  . Breast lump   . Colon polyps   . Fibroids   . GERD (gastroesophageal reflux disease)   . Glaucoma   . Hyperlipidemia 1986  . Hypertension 1986  . Interstitial cystitis 1999  . Kidney stones 2005  . Osteoporosis   . Tonsillitis      Family History  Problem Relation Age of Onset  . Coronary artery disease Father        mother, PGM  . Stroke Father   . Hypertension Father   . Colon cancer Father 71  . Hypertension Mother   . Heart disease Mother   . Colon cancer Mother 27  . Diabetes Other   . Colon cancer Maternal Grandmother   . Breast cancer Neg Hx      Current Outpatient Medications:  .  CALCIUM PO, Take 1 tablet by mouth daily. 500 mg daily, Disp: , Rfl:  .  Cholecalciferol (VITAMIN D PO), Take 1 tablet by mouth daily. 2000 units daily,  Disp: , Rfl:  .  finasteride (PROSCAR) 5 MG tablet, Take 2.5 mg by mouth daily.  , Disp: , Rfl:  .  latanoprost (XALATAN) 0.005 % ophthalmic solution, Place 1 drop into both eyes daily., Disp: , Rfl:  .  lisinopril (ZESTRIL) 10 MG tablet, TAKE 1 TABLET DAILY., Disp: 90 tablet, Rfl: 1 .  pravastatin (PRAVACHOL) 80 MG tablet, TAKE 1 TABLET EVERY DAY, Disp: 90 tablet, Rfl: 1 .  amLODipine (NORVASC) 5 MG tablet, Take 1 tablet (5 mg total) by mouth daily., Disp: 90 tablet, Rfl: 2   Allergies  Allergen Reactions  . Hctz [Hydrochlorothiazide] Itching     Review of Systems  Constitutional: Negative.   Eyes: Negative for blurred vision.  Respiratory: Negative.  Negative for shortness of breath.   Cardiovascular: Negative.  Negative for chest pain and palpitations.  Gastrointestinal: Negative.   Neurological: Negative.   Psychiatric/Behavioral: Negative.      Today's Vitals   05/02/19 1444  BP: 124/72  Pulse: 82  Temp: 98.5 F (36.9 C)  TempSrc: Oral  Weight: 165 lb 3.2 oz (74.9 kg)  Height: 5' 3.4" (1.61 m)  PainSc: 0-No pain   Body mass index is 28.9 kg/m.   Objective:  Physical Exam Vitals and nursing note reviewed.  Constitutional:      Appearance: Normal appearance.  HENT:     Head: Normocephalic and atraumatic.  Cardiovascular:     Rate and Rhythm: Normal rate and regular rhythm.     Heart sounds: Normal heart sounds.  Pulmonary:     Effort: Pulmonary effort is normal.     Breath sounds: Normal breath sounds.  Skin:    General: Skin is warm.  Neurological:     General: No focal deficit present.     Mental Status: She is alert.  Psychiatric:        Mood and Affect: Mood normal.        Behavior: Behavior normal.         Assessment And Plan:     1. Essential hypertension, benign  Chronic, her BP is now at goal. She will continue with current meds for now. She will continue to keep BP log and notify me if her BP is above 140/80.  She will rto in July 2021 for  her next evaluation. We also reviewed echo results and renal doppler results in full detail during her visit.   2. Angiomyolipoma  Incidental finding on renal doppler. She has upcoming appt with Urology.   Maximino Greenland, MD    THE PATIENT IS ENCOURAGED TO PRACTICE SOCIAL DISTANCING DUE TO THE COVID-19 PANDEMIC.

## 2019-05-07 ENCOUNTER — Other Ambulatory Visit: Payer: Self-pay

## 2019-05-07 MED ORDER — AMLODIPINE BESYLATE 5 MG PO TABS: 5.0000 mg | ORAL_TABLET | Freq: Every day | ORAL | 2 refills | Status: DC

## 2019-05-11 ENCOUNTER — Other Ambulatory Visit: Payer: Self-pay | Admitting: Internal Medicine

## 2019-05-11 MED ORDER — AMLODIPINE BESYLATE 5 MG PO TABS: 5.0000 mg | ORAL_TABLET | Freq: Every day | ORAL | 2 refills | Status: DC

## 2019-05-14 ENCOUNTER — Ambulatory Visit (INDEPENDENT_AMBULATORY_CARE_PROVIDER_SITE_OTHER): Payer: Medicare Other | Admitting: Neurology

## 2019-05-14 ENCOUNTER — Other Ambulatory Visit: Payer: Self-pay

## 2019-05-14 DIAGNOSIS — E663 Overweight: Secondary | ICD-10-CM

## 2019-05-14 DIAGNOSIS — G4733 Obstructive sleep apnea (adult) (pediatric): Secondary | ICD-10-CM

## 2019-05-14 DIAGNOSIS — R351 Nocturia: Secondary | ICD-10-CM

## 2019-05-14 DIAGNOSIS — R0683 Snoring: Secondary | ICD-10-CM

## 2019-05-14 DIAGNOSIS — R030 Elevated blood-pressure reading, without diagnosis of hypertension: Secondary | ICD-10-CM

## 2019-05-16 ENCOUNTER — Encounter: Payer: Self-pay | Admitting: Internal Medicine

## 2019-05-16 DIAGNOSIS — D49511 Neoplasm of unspecified behavior of right kidney: Secondary | ICD-10-CM | POA: Diagnosis not present

## 2019-05-18 NOTE — Progress Notes (Signed)
Patient referred by Dr. Baird Cancer, seen by me on 04/16/19, HST on 05/14/19.    Please call and notify the patient that the recent home sleep test showed obstructive sleep apnea. OSA is overall mild, but worth treating to see if she feels better after treatment and if her BP numbers are better. To that end I recommend treatment for this in the form of autoPAP, which means, that we don't have to bring her in for a sleep study with CPAP, but will let her try an autoPAP machine at home, through a DME company (of her choice, or as per insurance requirement). The DME representative will educate her on how to use the machine, how to put the mask on, etc. I have placed an order in the chart. Please send referral, talk to patient, send report to referring MD. We will need a FU in sleep clinic for 10 weeks post-PAP set up, please arrange that with me or one of our NPs. Thanks,   Star Age, MD, PhD [facility])

## 2019-05-18 NOTE — Procedures (Signed)
Patient Information     First Name: Marissa Last Name: Bena Orozco: [id number]  Birth Date: 04-12-1945 Age: 74 Gender: Female  Referring Provider: Glendale Chard, MD BMI: 27.9 (W=163 lb, H=5' 4'')  Neck Circ.:  13 '' Epworth:  6/24   Sleep Study Information    Study Date: May 14, 2019 S/H/A Version: 001.001.001.001 / 4.1.1528 / 40  History:    74 year old woman with a history of reflux disease, hypertension, hyperlipidemia, kidney stones, osteoporosis, interstitial cystitis, glaucoma, and overweight state, who reports snoring, sleep disruption and elevated BP values recently. Summary & Diagnosis:     Mild OSA Recommendations:     This home sleep test demonstrates overall mild obstructive sleep apnea with a total AHI of 13.7/hour and O2 nadir of 90%. Given the patient's medical history and sleep related complaints, treatment with positive airway pressure is a reasonable choice and can be achieved in the form of autoPAP trial/titration at home. A full night CPAP titration study will help with proper treatment settings and mask fitting if needed. Alternative treatments include weight loss along with avoidance of the supine sleep position, or an oral appliance in appropriate candidates. Please note that untreated obstructive sleep apnea may carry additional perioperative morbidity. Patients with significant obstructive sleep apnea should receive perioperative PAP therapy and the surgeons and particularly the anesthesiologist should be informed of the diagnosis and the severity of the sleep disordered breathing. The patient should be cautioned not to drive, work at heights, or operate dangerous or heavy equipment when tired or sleepy. Review and reiteration of good sleep hygiene measures should be pursued with any patient. Other causes of the patient's symptoms, including circadian rhythm disturbances, an underlying mood disorder, medication effect and/or an underlying medical problem cannot be ruled out  based on this test. Clinical correlation is recommended.   The patient and her referring provider will be notified of the test results. The patient will be seen in follow up in sleep clinic at [facility].  I certify that I have reviewed the raw data recording prior to the issuance of this report in accordance with the standards of the American Academy of Sleep Medicine (AASM).  Star Age, MD, PhD [facility]) Diplomat, ABPN (Neurology and Sleep)              Sleep Summary  Oxygen Saturation Statistics   Start Study Time: End Study Time: Total Recording Time:          10:18:39 PM 8:07:18 AM   9 h, 48 min  Total Sleep Time % REM of Sleep Time:  9 h, 12 min  14.3    Mean: 94 Minimum: 90 Maximum: 99  Mean of Desaturations Nadirs (%):   93  Oxygen Desaturation. %: 4-9 10-20 >20 Total  Events Number Total  15 100.0  0 0.0  0 0.0  15 100.0  Oxygen Saturation: <90 <=88 <85 <80 <70  Duration (minutes): Sleep % 0.0 0.0 0.0 0.0 0.0 0.0 0.0 0.0 0.0 0.0     Respiratory Indices      Total Events REM NREM All Night  pRDI:  139  pAHI:  126 ODI:  15  pAHIc:  14  % CSR: 0.0 11.5 9.2 3.8 2.6 15.7 14.5 1.3 1.8 15.1 13.7 1.6 1.9       Pulse Rate Statistics during Sleep (BPM)      Mean: 61 Minimum: 51 Maximum: 98    Indices are calculated using technically valid sleep  time of 9 h, 11 min. Central-Indices are calculated using technically valid sleep time of 7 h, 18 min. pRDI/pAHI are calculated using oxi desaturations ? 3%  Body Position Statistics  Position Supine Prone Right Left Non-Supine  Sleep (min) 406.5 52.1 0.0 94.0 146.1  Sleep % 73.6 9.4 0.0 17.0 26.4  pRDI 16.7 11.5 N/A 10.3 10.7  pAHI 15.1 10.4 N/A 9.6 9.9  ODI 2.1 0.0 N/A 0.6 0.4     Snoring Statistics Snoring Level (dB) >40 >50 >60 >70 >80 >Threshold (45)  Sleep (min) 495.1 12.5 2.2 0.2 0.0 17.9  Sleep % 89.6 2.3 0.4 0.0 0.0 3.2    Mean: 41 dB Sleep Stages  Chart                     pAHI=13.7                                 Mild              Moderate                    Severe                                                 5              15                    30

## 2019-05-18 NOTE — Addendum Note (Signed)
Addended by: Star Age on: 05/18/2019 02:53 PM   Modules accepted: Orders

## 2019-05-21 ENCOUNTER — Telehealth: Payer: Self-pay

## 2019-05-21 NOTE — Telephone Encounter (Signed)
I called pt to discuss. No answer, left a message asking her to call me back. 

## 2019-05-21 NOTE — Telephone Encounter (Signed)
Noted, thanks!

## 2019-05-21 NOTE — Telephone Encounter (Signed)
-----   Message from Star Age, MD sent at 05/18/2019  2:53 PM EDT ----- Patient referred by Dr. Baird Cancer, seen by me on 04/16/19, HST on 05/14/19.    Please call and notify the patient that the recent home sleep test showed obstructive sleep apnea. OSA is overall mild, but worth treating to see if she feels better after treatment and if her BP numbers are better. To that end I recommend treatment for this in the form of autoPAP, which means, that we don't have to bring her in for a sleep study with CPAP, but will let her try an autoPAP machine at home, through a DME company (of her choice, or as per insurance requirement). The DME representative will educate her on how to use the machine, how to put the mask on, etc. I have placed an order in the chart. Please send referral, talk to patient, send report to referring MD. We will need a FU in sleep clinic for 10 weeks post-PAP set up, please arrange that with me or one of our NPs. Thanks,   Star Age, MD, PhD [facility])

## 2019-05-21 NOTE — Telephone Encounter (Signed)
Pt returned my call. I discussed her sleep study results and recommendations. Pt reports that she has read the sleep study report on mychart and would prefer to not start an auto pap but pursue alternative treatments such as the avoidance of supine sleep, weight loss, and perhaps a dental device. I will send a copy of the results to Dr. Baird Cancer. Pt verbalized understanding of results and recommendations. Pt had no questions at this time but was encouraged to call back if questions arise.

## 2019-05-22 ENCOUNTER — Ambulatory Visit: Payer: Medicare Other | Admitting: Internal Medicine

## 2019-05-22 ENCOUNTER — Ambulatory Visit: Payer: Medicare Other

## 2019-05-24 ENCOUNTER — Ambulatory Visit: Payer: Medicare Other

## 2019-05-24 ENCOUNTER — Ambulatory Visit: Payer: Medicare Other | Admitting: Internal Medicine

## 2019-07-04 ENCOUNTER — Ambulatory Visit: Payer: Medicare Other

## 2019-07-04 ENCOUNTER — Ambulatory Visit: Payer: Medicare Other | Admitting: Internal Medicine

## 2019-07-17 ENCOUNTER — Other Ambulatory Visit: Payer: Self-pay | Admitting: Internal Medicine

## 2019-07-19 ENCOUNTER — Ambulatory Visit (INDEPENDENT_AMBULATORY_CARE_PROVIDER_SITE_OTHER): Payer: Medicare Other

## 2019-07-19 ENCOUNTER — Ambulatory Visit (INDEPENDENT_AMBULATORY_CARE_PROVIDER_SITE_OTHER): Payer: Medicare Other | Admitting: Internal Medicine

## 2019-07-19 ENCOUNTER — Encounter: Payer: Self-pay | Admitting: Internal Medicine

## 2019-07-19 ENCOUNTER — Other Ambulatory Visit: Payer: Self-pay

## 2019-07-19 VITALS — BP 138/78 | HR 75 | Temp 98.0°F | Ht 63.0 in | Wt 162.0 lb

## 2019-07-19 DIAGNOSIS — Z0000 Encounter for general adult medical examination without abnormal findings: Secondary | ICD-10-CM | POA: Diagnosis not present

## 2019-07-19 DIAGNOSIS — Z6828 Body mass index (BMI) 28.0-28.9, adult: Secondary | ICD-10-CM

## 2019-07-19 DIAGNOSIS — N3000 Acute cystitis without hematuria: Secondary | ICD-10-CM

## 2019-07-19 DIAGNOSIS — E663 Overweight: Secondary | ICD-10-CM

## 2019-07-19 DIAGNOSIS — I10 Essential (primary) hypertension: Secondary | ICD-10-CM

## 2019-07-19 LAB — POCT URINALYSIS DIPSTICK
Bilirubin, UA: NEGATIVE
Blood, UA: NEGATIVE
Glucose, UA: NEGATIVE
Ketones, UA: NEGATIVE
Nitrite, UA: POSITIVE
Protein, UA: POSITIVE — AB
Spec Grav, UA: 1.02 (ref 1.010–1.025)
Urobilinogen, UA: 0.2 E.U./dL
pH, UA: 6 (ref 5.0–8.0)

## 2019-07-19 LAB — POCT UA - MICROALBUMIN
Creatinine, POC: 300 mg/dL
Microalbumin Ur, POC: 80 mg/L

## 2019-07-19 NOTE — Patient Instructions (Signed)
   Managing Your Hypertension Hypertension is commonly called high blood pressure. This is when the force of your blood pressing against the walls of your arteries is too strong. Arteries are blood vessels that carry blood from your heart throughout your body. Hypertension forces the heart to work harder to pump blood, and may cause the arteries to become narrow or stiff. Having untreated or uncontrolled hypertension can cause heart attack, stroke, kidney disease, and other problems. What are blood pressure readings? A blood pressure reading consists of a higher number over a lower number. Ideally, your blood pressure should be below 120/80. The first ("top") number is called the systolic pressure. It is a measure of the pressure in your arteries as your heart beats. The second ("bottom") number is called the diastolic pressure. It is a measure of the pressure in your arteries as the heart relaxes. What does my blood pressure reading mean? Blood pressure is classified into four stages. Based on your blood pressure reading, your health care provider may use the following stages to determine what type of treatment you need, if any. Systolic pressure and diastolic pressure are measured in a unit called mm Hg. Normal  Systolic pressure: below 120.  Diastolic pressure: below 80. Elevated  Systolic pressure: 120-129.  Diastolic pressure: below 80. Hypertension stage 1  Systolic pressure: 130-139.  Diastolic pressure: 80-89. Hypertension stage 2  Systolic pressure: 140 or above.  Diastolic pressure: 90 or above. What health risks are associated with hypertension? Managing your hypertension is an important responsibility. Uncontrolled hypertension can lead to:  A heart attack.  A stroke.  A weakened blood vessel (aneurysm).  Heart failure.  Kidney damage.  Eye damage.  Metabolic syndrome.  Memory and concentration problems. What changes can I make to manage my  hypertension? Hypertension can be managed by making lifestyle changes and possibly by taking medicines. Your health care provider will help you make a plan to bring your blood pressure within a normal range. Eating and drinking   Eat a diet that is high in fiber and potassium, and low in salt (sodium), added sugar, and fat. An example eating plan is called the DASH (Dietary Approaches to Stop Hypertension) diet. To eat this way: ? Eat plenty of fresh fruits and vegetables. Try to fill half of your plate at each meal with fruits and vegetables. ? Eat whole grains, such as whole wheat pasta, brown rice, or whole grain bread. Fill about one quarter of your plate with whole grains. ? Eat low-fat diary products. ? Avoid fatty cuts of meat, processed or cured meats, and poultry with skin. Fill about one quarter of your plate with lean proteins such as fish, chicken without skin, beans, eggs, and tofu. ? Avoid premade and processed foods. These tend to be higher in sodium, added sugar, and fat.  Reduce your daily sodium intake. Most people with hypertension should eat less than 1,500 mg of sodium a day.  Limit alcohol intake to no more than 1 drink a day for nonpregnant women and 2 drinks a day for men. One drink equals 12 oz of beer, 5 oz of wine, or 1 oz of hard liquor. Lifestyle  Work with your health care provider to maintain a healthy body weight, or to lose weight. Ask what an ideal weight is for you.  Get at least 30 minutes of exercise that causes your heart to beat faster (aerobic exercise) most days of the week. Activities may include walking, swimming, or biking.    Include exercise to strengthen your muscles (resistance exercise), such as weight lifting, as part of your weekly exercise routine. Try to do these types of exercises for 30 minutes at least 3 days a week.  Do not use any products that contain nicotine or tobacco, such as cigarettes and e-cigarettes. If you need help quitting,  ask your health care provider.  Control any long-term (chronic) conditions you have, such as high cholesterol or diabetes. Monitoring  Monitor your blood pressure at home as told by your health care provider. Your personal target blood pressure may vary depending on your medical conditions, your age, and other factors.  Have your blood pressure checked regularly, as often as told by your health care provider. Working with your health care provider  Review all the medicines you take with your health care provider because there may be side effects or interactions.  Talk with your health care provider about your diet, exercise habits, and other lifestyle factors that may be contributing to hypertension.  Visit your health care provider regularly. Your health care provider can help you create and adjust your plan for managing hypertension. Will I need medicine to control my blood pressure? Your health care provider may prescribe medicine if lifestyle changes are not enough to get your blood pressure under control, and if:  Your systolic blood pressure is 130 or higher.  Your diastolic blood pressure is 80 or higher. Take medicines only as told by your health care provider. Follow the directions carefully. Blood pressure medicines must be taken as prescribed. The medicine does not work as well when you skip doses. Skipping doses also puts you at risk for problems. Contact a health care provider if:  You think you are having a reaction to medicines you have taken.  You have repeated (recurrent) headaches.  You feel dizzy.  You have swelling in your ankles.  You have trouble with your vision. Get help right away if:  You develop a severe headache or confusion.  You have unusual weakness or numbness, or you feel faint.  You have severe pain in your chest or abdomen.  You vomit repeatedly.  You have trouble breathing. Summary  Hypertension is when the force of blood pumping  through your arteries is too strong. If this condition is not controlled, it may put you at risk for serious complications.  Your personal target blood pressure may vary depending on your medical conditions, your age, and other factors. For most people, a normal blood pressure is less than 120/80.  Hypertension is managed by lifestyle changes, medicines, or both. Lifestyle changes include weight loss, eating a healthy, low-sodium diet, exercising more, and limiting alcohol. This information is not intended to replace advice given to you by your health care provider. Make sure you discuss any questions you have with your health care provider. Document Revised: 04/14/2018 Document Reviewed: 11/19/2015 Elsevier Patient Education  2020 [employer] Inc.  

## 2019-07-19 NOTE — Progress Notes (Signed)
This visit occurred during the SARS-CoV-2 public health emergency.  Safety protocols were in place, including screening questions prior to the visit, additional usage of staff PPE, and extensive cleaning of exam room while observing appropriate contact time as indicated for disinfecting solutions.  Subjective:   ERNISHA SORN is a 74 y.o. female who presents for Medicare Annual (Subsequent) preventive examination.  Review of Systems     Cardiac Risk Factors include: advanced age (>61men, >74 women);hypertension     Objective:    Today's Vitals   07/19/19 0915  BP: 138/78  Pulse: 75  Temp: 98 F (36.7 C)  TempSrc: Oral  SpO2: 97%  Weight: 162 lb (73.5 kg)  Height: 5\' 3"  (1.6 m)   Body mass index is 28.7 kg/m.  Advanced Directives 07/19/2019 05/18/2018 05/27/2017 05/22/2013  Does Patient Have a Medical Advance Directive? Yes Yes Yes Patient has advance directive, copy not in chart  Type of Advance Directive [place];Living will [place];Living will [place];Living will [place] in Chart? No - copy requested No - copy requested - -    Current Medications (verified) Outpatient Encounter Medications as of 07/19/2019  Medication Sig  . amLODipine (NORVASC) 5 MG tablet Take 1 tablet (5 mg total) by mouth daily.  Marland Kitchen CALCIUM PO Take 1 tablet by mouth daily. 500 mg daily  . Cholecalciferol (VITAMIN D PO) Take 1 tablet by mouth daily. 2000 units daily  . finasteride (PROSCAR) 5 MG tablet Take 2.5 mg by mouth daily.    Marland Kitchen latanoprost (XALATAN) 0.005 % ophthalmic solution Place 1 drop into both eyes daily.  Marland Kitchen lisinopril (ZESTRIL) 10 MG tablet TAKE 1 TABLET EVERY DAY  . pravastatin (PRAVACHOL) 80 MG tablet TAKE 1 TABLET EVERY DAY   No facility-administered encounter medications on file as of 07/19/2019.    Allergies (verified) Hctz [hydrochlorothiazide]    History: Past Medical History:  Diagnosis Date  . Breast lump   . Colon polyps   . Fibroids   . GERD (gastroesophageal reflux disease)   . Glaucoma   . Hyperlipidemia 1986  . Hypertension 1986  . Interstitial cystitis 1999  . Kidney stones 2005  . Osteoporosis   . Tonsillitis    Past Surgical History:  Procedure Laterality Date  . BREAST EXCISIONAL BIOPSY Right 2013?   Benign  . BREAST LUMPECTOMY Right 2008   right breast; benign  . COLONOSCOPY  2010   Dr Collene Mares  . HYSTEROSCOPY  1993  . LAPAROSCOPIC HYSTERECTOMY  1994  . MYOMECTOMY  1993   excision of tumor  . POLYPECTOMY    . TONSILLECTOMY  1953   Family History  Problem Relation Age of Onset  . Coronary artery disease Father        mother, PGM  . Stroke Father   . Hypertension Father   . Colon cancer Father 6  . Hypertension Mother   . Heart disease Mother   . Colon cancer Mother 79  . Diabetes Other   . Colon cancer Maternal Grandmother   . Breast cancer Neg Hx    Social History   Socioeconomic History  . Marital status: Married    Spouse name: Not on file  . Number of children: 2  . Years of education: Not on file  . Highest education level: Not on file  Occupational History  . Occupation: retired  Tobacco Use  . Smoking status: Never Smoker  .  Smokeless tobacco: Never Used  Vaping Use  . Vaping Use: Never used  Substance and Sexual Activity  . Alcohol use: Yes    Alcohol/week: 12.0 standard drinks    Types: 6 Standard drinks or equivalent, 6 Glasses of wine per week  . Drug use: No  . Sexual activity: Not Currently  Other Topics Concern  . Not on file  Social History Narrative  . Not on file   Social Determinants of Health   Financial Resource Strain: Low Risk   . Difficulty of Paying Living Expenses: Not hard at all  Food Insecurity: No Food Insecurity  . Worried About Charity fundraiser in the Last Year: Never true  . Ran Out of Food in the Last Year: Never true  Transportation  Needs: No Transportation Needs  . Lack of Transportation (Medical): No  . Lack of Transportation (Non-Medical): No  Physical Activity: Sufficiently Active  . Days of Exercise per Week: 5 days  . Minutes of Exercise per Session: 40 min  Stress: No Stress Concern Present  . Feeling of Stress : Not at all  Social Connections:   . Frequency of Communication with Friends and Family:   . Frequency of Social Gatherings with Friends and Family:   . Attends Religious Services:   . Active Member of Clubs or Organizations:   . Attends Archivist Meetings:   Marland Kitchen Marital Status:     Tobacco Counseling Counseling given: Not Answered   Clinical Intake:  Pre-visit preparation completed: Yes  Pain : No/denies pain     Nutritional Status: BMI 25 -29 Overweight Nutritional Risks: None Diabetes: No  How often do you need to have someone help you when you read instructions, pamphlets, or other written materials from your doctor or pharmacy?: 1 - Never What is the last grade level you completed in school?: doctorate degree  Diabetic? no  Interpreter Needed?: No  Information entered by :: NAllen LPN   Activities of Daily Living In your present state of health, do you have any difficulty performing the following activities: 07/19/2019  Hearing? N  Vision? Y  Comment things are blurry due to cataracts  Difficulty concentrating or making decisions? N  Walking or climbing stairs? N  Dressing or bathing? N  Doing errands, shopping? N  Preparing Food and eating ? N  Using the Toilet? N  In the past six months, have you accidently leaked urine? N  Do you have problems with loss of bowel control? N  Managing your Medications? N  Managing your Finances? N  Housekeeping or managing your Housekeeping? N  Some recent data might be hidden    Patient Care Team: Glendale Chard, MD as PCP - General (Internal Medicine)  Indicate any recent Medical Services you may have received from  other than [facility] providers in the past year (date may be approximate).     Assessment:   This is a routine wellness examination for [place].  Hearing/Vision screen  Hearing Screening   125Hz  250Hz  500Hz  1000Hz  2000Hz  3000Hz  4000Hz  6000Hz  8000Hz   Right ear:           Left ear:           Vision Screening Comments: Regular eye exams, Dr. Delman Cheadle  Dietary issues and exercise activities discussed: Current Exercise Habits: Home exercise routine, Type of exercise: walking, Time (Minutes): 45, Frequency (Times/Week): 5, Weekly Exercise (Minutes/Week): 225  Goals    . Weight (lb) < 200 lb (90.7 kg)  Would like to weigh 142 pounds    . Weight (lb) < 200 lb (90.7 kg)     07/19/2019, wants to weigh 145 pounds      Depression Screen PHQ 2/9 Scores 07/19/2019 05/18/2018 05/18/2018 11/02/2017  PHQ - 2 Score 0 0 0 0  PHQ- 9 Score 1 0 - -    Fall Risk Fall Risk  07/19/2019 11/20/2018 05/18/2018 05/18/2018 11/18/2017  Falls in the past year? 0 0 0 0 0  Comment - - - - Emmi Telephone Survey: data to providers prior to load  Risk for fall due to : Medication side effect - Medication side effect - -  Follow up Falls evaluation completed;Education provided;Falls prevention discussed - Education provided - -    Any stairs in or around the home? Yes  If so, are there any without handrails? Yes  Home free of loose throw rugs in walkways, pet beds, electrical cords, etc? Yes  Adequate lighting in your home to reduce risk of falls? Yes   ASSISTIVE DEVICES UTILIZED TO PREVENT FALLS:  Life alert? No  Use of a cane, walker or w/c? No  Grab bars in the bathroom? No  Shower chair or bench in shower? Yes  Elevated toilet seat or a handicapped toilet? No   TIMED UP AND GO:  Was the test performed? No .   Gait steady and fast without use of assistive device  Cognitive Function:     6CIT Screen 07/19/2019 05/18/2018  What Year? 0 points 0 points  What month? 0 points 0 points  What time? 0 points 0  points  Count back from 20 0 points 0 points  Months in reverse 0 points 0 points  Repeat phrase 0 points 0 points  Total Score 0 0    Immunizations Immunization History  Administered Date(s) Administered  . Influenza, High Dose Seasonal PF 11/20/2018  . Influenza-Unspecified 09/02/2017  . Moderna SARS-COVID-2 Vaccination 01/26/2019, 03/02/2019  . Pneumococcal Polysaccharide-23 01/30/2018  . Tdap 06/01/2012    TDAP status: Up to date Flu Vaccine status: Up to date Pneumococcal vaccine status: Up to date Covid-19 vaccine status: Completed vaccines  Qualifies for Shingles Vaccine? Yes   Zostavax completed Yes   Shingrix Completed?: No.    Education has been provided regarding the importance of this vaccine. Patient has been advised to call insurance company to determine out of pocket expense if they have not yet received this vaccine. Advised may also receive vaccine at local pharmacy or Health Dept. Verbalized acceptance and understanding.  Screening Tests Health Maintenance  Topic Date Due  . PNA vac Low Risk Adult (2 of 2 - PCV13) 07/18/2020 (Originally 01/31/2019)  . INFLUENZA VACCINE  08/05/2019  . COLONOSCOPY  05/28/2022  . TETANUS/TDAP  06/02/2022  . DEXA SCAN  Completed  . COVID-19 Vaccine  Completed  . Hepatitis C Screening  Completed    Health Maintenance  There are no preventive care reminders to display for this patient.  Colorectal cancer screening: Completed 05/27/2017 . Repeat every 5 years Mammogram status: Completed 12/08/2018. Repeat every year Bone Density status: Completed 10/19/2017.   Lung Cancer Screening: (Low Dose CT Chest recommended if Age 33-80 years, 30 pack-year currently smoking OR have quit w/in 15years.) does not qualify.   Lung Cancer Screening Referral: no  Additional Screening:  Hepatitis C Screening: does qualify; Completed 04/13/2012  Vision Screening: Recommended annual ophthalmology exams for early detection of glaucoma and other  disorders of the eye. Is the patient up  to date with their annual eye exam?  Yes  Who is the provider or what is the name of the office in which the patient attends annual eye exams? Dr Delman Cheadle If pt is not established with a provider, would they like to be referred to a provider to establish care? No .   Dental Screening: Recommended annual dental exams for proper oral hygiene  Community Resource Referral / Chronic Care Management: CRR required this visit?  No   CCM required this visit?  No      Plan:     I have personally reviewed and noted the following in the patient's chart:   . Medical and social history . Use of alcohol, tobacco or illicit drugs  . Current medications and supplements . Functional ability and status . Nutritional status . Physical activity . Advanced directives . List of other physicians . Hospitalizations, surgeries, and ER visits in previous 12 months . Vitals . Screenings to include cognitive, depression, and falls . Referrals and appointments  In addition, I have reviewed and discussed with patient certain preventive protocols, quality metrics, and best practice recommendations. A written personalized care plan for preventive services as well as general preventive health recommendations were provided to patient.     Kellie Simmering, LPN   02/09/154   Nurse Notes:

## 2019-07-19 NOTE — Progress Notes (Signed)
I,Katawbba Wiggins,acting as a Education administrator for Maximino Greenland, MD.,have documented all relevant documentation on the behalf of Maximino Greenland, MD,as directed by  Maximino Greenland, MD while in the presence of Maximino Greenland, MD. This visit occurred during the SARS-CoV-2 public health emergency.  Safety protocols were in place, including screening questions prior to the visit, additional usage of staff PPE, and extensive cleaning of exam room while observing appropriate contact time as indicated for disinfecting solutions.  Subjective:     Patient ID: Marissa Orozco , female    DOB: 18-Jun-1945 , 74 y.o.   MRN: [id number]   Chief Complaint  Patient presents with  . Hypertension    HPI  Hypertension This is a chronic problem. The current episode started more than 1 year ago. The problem is controlled. Pertinent negatives include no blurred vision, chest pain, palpitations or shortness of breath. Risk factors for coronary artery disease include dyslipidemia and post-menopausal state. Past treatments include ACE inhibitors. The current treatment provides moderate improvement.     Past Medical History:  Diagnosis Date  . Breast lump   . Colon polyps   . Fibroids   . GERD (gastroesophageal reflux disease)   . Glaucoma   . Hyperlipidemia 1986  . Hypertension 1986  . Interstitial cystitis 1999  . Kidney stones 2005  . Osteoporosis   . Tonsillitis      Family History  Problem Relation Age of Onset  . Coronary artery disease Father        mother, PGM  . Stroke Father   . Hypertension Father   . Colon cancer Father 68  . Hypertension Mother   . Heart disease Mother   . Colon cancer Mother 69  . Diabetes Other   . Colon cancer Maternal Grandmother   . Breast cancer Neg Hx      Current Outpatient Medications:  .  amLODipine (NORVASC) 5 MG tablet, Take 1 tablet (5 mg total) by mouth daily., Disp: 90 tablet, Rfl: 2 .  CALCIUM PO, Take 1 tablet by mouth daily. 500 mg daily, Disp: ,  Rfl:  .  Cholecalciferol (VITAMIN D PO), Take 1 tablet by mouth daily. 2000 units daily, Disp: , Rfl:  .  finasteride (PROSCAR) 5 MG tablet, Take 2.5 mg by mouth daily.  , Disp: , Rfl:  .  latanoprost (XALATAN) 0.005 % ophthalmic solution, Place 1 drop into both eyes daily., Disp: , Rfl:  .  lisinopril (ZESTRIL) 10 MG tablet, TAKE 1 TABLET EVERY DAY, Disp: 90 tablet, Rfl: 1 .  pravastatin (PRAVACHOL) 80 MG tablet, TAKE 1 TABLET EVERY DAY, Disp: 90 tablet, Rfl: 1   Allergies  Allergen Reactions  . Hctz [Hydrochlorothiazide] Itching     Review of Systems  Constitutional: Negative.   Eyes: Negative for blurred vision.  Respiratory: Negative.  Negative for shortness of breath.   Cardiovascular: Negative.  Negative for chest pain and palpitations.  Gastrointestinal: Negative.   Neurological: Negative.   Psychiatric/Behavioral: Negative.   All other systems reviewed and are negative.    Today's Vitals   07/19/19 0941  BP: 138/78  Pulse: 75  Temp: 98 F (36.7 C)  TempSrc: Oral  Weight: 162 lb (73.5 kg)  Height: 5\' 3"  (1.6 m)   Body mass index is 28.7 kg/m.  Wt Readings from Last 3 Encounters:  07/19/19 162 lb (73.5 kg)  07/19/19 162 lb (73.5 kg)  05/02/19 165 lb 3.2 oz (74.9 kg)   Objective:  Physical Exam Vitals  and nursing note reviewed.  Constitutional:      Appearance: Normal appearance. She is obese.  HENT:     Head: Normocephalic and atraumatic.  Cardiovascular:     Rate and Rhythm: Normal rate and regular rhythm.     Heart sounds: Normal heart sounds.  Pulmonary:     Breath sounds: Normal breath sounds.  Skin:    General: Skin is warm.  Neurological:     General: No focal deficit present.     Mental Status: She is alert and oriented to person, place, and time.         Assessment And Plan:     1. Essential hypertension  Chronic. BP log reviewed, she is still having elevated readings in 320E/334D systolic. I will consider adding spironolactone to her  regimen. I will check aldosterone/renin level today.   2. Overweight with body mass index (BMI) of 28 to 28.9 in adult  Encouraged to exercise at least 150 minutes per week.  3. Acute cystitis without hematuria  Urinalysis performed during her [facility] with [facility] advisor is suggestive of UTI. I will send off urine culture and treat accordingly.  - Culture, Urine   Patient was given opportunity to ask questions. Patient verbalized understanding of the plan and was able to repeat key elements of the plan. All questions were answered to their satisfaction.  Maximino Greenland, MD   I, Maximino Greenland, MD, have reviewed all documentation for this visit. The documentation on 07/19/19 for the exam, diagnosis, procedures, and orders are all accurate and complete.  THE PATIENT IS ENCOURAGED TO PRACTICE SOCIAL DISTANCING DUE TO THE COVID-19 PANDEMIC.

## 2019-07-19 NOTE — Patient Instructions (Signed)
[name] , Thank you for taking time to come for your Medicare Wellness Visit. I appreciate your ongoing commitment to your health goals. Please review the following plan we discussed and let me know if I can assist you in the future.   Screening recommendations/referrals: Colonoscopy: completed 05/27/2017, due 05/28/2022 Mammogram: completed 12/08/2018, due 12/08/2019 Bone Density: completed 10/19/2017 Recommended yearly ophthalmology/optometry visit for glaucoma screening and checkup Recommended yearly dental visit for hygiene and checkup  Vaccinations: Influenza vaccine: completed 11/20/2018, due 08/05/2019 Pneumococcal vaccine: completed 01/30/2018 Tdap vaccine: completed 06/01/2012, due 06/02/2022 Shingles vaccine: discussed   Covid-19:03/02/2019, 01/26/2019  Advanced directives: Please bring a copy of your POA (Power of [place]) and/or Living Will to your next appointment.   Conditions/risks identified: none  Next appointment: Follow up in one year for your annual wellness visit    Preventive Care 65 Years and Older, Female Preventive care refers to lifestyle choices and visits with your health care provider that can promote health and wellness. What does preventive care include?  A yearly physical exam. This is also called an annual well check.  Dental exams once or twice a year.  Routine eye exams. Ask your health care provider how often you should have your eyes checked.  Personal lifestyle choices, including:  Daily care of your teeth and gums.  Regular physical activity.  Eating a healthy diet.  Avoiding tobacco and drug use.  Limiting alcohol use.  Practicing safe sex.  Taking low-dose aspirin every day.  Taking vitamin and mineral supplements as recommended by your health care provider. What happens during an annual well check? The services and screenings done by your health care provider during your annual well check will depend on your age, overall health,  lifestyle risk factors, and family history of disease. Counseling  Your health care provider may ask you questions about your:  Alcohol use.  Tobacco use.  Drug use.  Emotional well-being.  Home and relationship well-being.  Sexual activity.  Eating habits.  History of falls.  Memory and ability to understand (cognition).  Work and work Statistician.  Reproductive health. Screening  You may have the following tests or measurements:  Height, weight, and BMI.  Blood pressure.  Lipid and cholesterol levels. These may be checked every 5 years, or more frequently if you are over 62 years old.  Skin check.  Lung cancer screening. You may have this screening every year starting at age 25 if you have a 30-pack-year history of smoking and currently smoke or have quit within the past 15 years.  Fecal occult blood test (FOBT) of the stool. You may have this test every year starting at age 2.  Flexible sigmoidoscopy or colonoscopy. You may have a sigmoidoscopy every 5 years or a colonoscopy every 10 years starting at age 35.  Hepatitis C blood test.  Hepatitis B blood test.  Sexually transmitted disease (STD) testing.  Diabetes screening. This is done by checking your blood sugar (glucose) after you have not eaten for a while (fasting). You may have this done every 1-3 years.  Bone density scan. This is done to screen for osteoporosis. You may have this done starting at age 71.  Mammogram. This may be done every 1-2 years. Talk to your health care provider about how often you should have regular mammograms. Talk with your health care provider about your test results, treatment options, and if necessary, the need for more tests. Vaccines  Your health care provider may recommend certain vaccines, such as:  Influenza vaccine. This is recommended every year.  Tetanus, diphtheria, and acellular pertussis (Tdap, Td) vaccine. You may need a Td booster every 10 years.  Zoster  vaccine. You may need this after age 61.  Pneumococcal 13-valent conjugate (PCV13) vaccine. One dose is recommended after age 52.  Pneumococcal polysaccharide (PPSV23) vaccine. One dose is recommended after age 66. Talk to your health care provider about which screenings and vaccines you need and how often you need them. This information is not intended to replace advice given to you by your health care provider. Make sure you discuss any questions you have with your health care provider. Document Released: 01/17/2015 Document Revised: 09/10/2015 Document Reviewed: 10/22/2014 Elsevier Interactive Patient Education  2017 [employer] Prevention in the Home Falls can cause injuries. They can happen to people of all ages. There are many things you can do to make your home safe and to help prevent falls. What can I do on the outside of my home?  Regularly fix the edges of walkways and driveways and fix any cracks.  Remove anything that might make you trip as you walk through a door, such as a raised step or threshold.  Trim any bushes or trees on the path to your home.  Use bright outdoor lighting.  Clear any walking paths of anything that might make someone trip, such as rocks or tools.  Regularly check to see if handrails are loose or broken. Make sure that both sides of any steps have handrails.  Any raised decks and porches should have guardrails on the edges.  Have any leaves, snow, or ice cleared regularly.  Use sand or salt on walking paths during winter.  Clean up any spills in your garage right away. This includes oil or grease spills. What can I do in the bathroom?  Use night lights.  Install grab bars by the toilet and in the tub and shower. Do not use towel bars as grab bars.  Use non-skid mats or decals in the tub or shower.  If you need to sit down in the shower, use a plastic, non-slip stool.  Keep the floor dry. Clean up any water that spills on the  floor as soon as it happens.  Remove soap buildup in the tub or shower regularly.  Attach bath mats securely with double-sided non-slip rug tape.  Do not have throw rugs and other things on the floor that can make you trip. What can I do in the bedroom?  Use night lights.  Make sure that you have a light by your bed that is easy to reach.  Do not use any sheets or blankets that are too big for your bed. They should not hang down onto the floor.  Have a firm chair that has side arms. You can use this for support while you get dressed.  Do not have throw rugs and other things on the floor that can make you trip. What can I do in the kitchen?  Clean up any spills right away.  Avoid walking on wet floors.  Keep items that you use a lot in easy-to-reach places.  If you need to reach something above you, use a strong step stool that has a grab bar.  Keep electrical cords out of the way.  Do not use floor polish or wax that makes floors slippery. If you must use wax, use non-skid floor wax.  Do not have throw rugs and other things on the floor that  can make you trip. What can I do with my stairs?  Do not leave any items on the stairs.  Make sure that there are handrails on both sides of the stairs and use them. Fix handrails that are broken or loose. Make sure that handrails are as long as the stairways.  Check any carpeting to make sure that it is firmly attached to the stairs. Fix any carpet that is loose or worn.  Avoid having throw rugs at the top or bottom of the stairs. If you do have throw rugs, attach them to the floor with carpet tape.  Make sure that you have a light switch at the top of the stairs and the bottom of the stairs. If you do not have them, ask someone to add them for you. What else can I do to help prevent falls?  Wear shoes that:  Do not have high heels.  Have rubber bottoms.  Are comfortable and fit you well.  Are closed at the toe. Do not wear  sandals.  If you use a stepladder:  Make sure that it is fully opened. Do not climb a closed stepladder.  Make sure that both sides of the stepladder are locked into place.  Ask someone to hold it for you, if possible.  Clearly mark and make sure that you can see:  Any grab bars or handrails.  First and last steps.  Where the edge of each step is.  Use tools that help you move around (mobility aids) if they are needed. These include:  Canes.  Walkers.  Scooters.  Crutches.  Turn on the lights when you go into a dark area. Replace any light bulbs as soon as they burn out.  Set up your furniture so you have a clear path. Avoid moving your furniture around.  If any of your floors are uneven, fix them.  If there are any pets around you, be aware of where they are.  Review your medicines with your doctor. Some medicines can make you feel dizzy. This can increase your chance of falling. Ask your doctor what other things that you can do to help prevent falls. This information is not intended to replace advice given to you by your health care provider. Make sure you discuss any questions you have with your health care provider. Document Released: 10/17/2008 Document Revised: 05/29/2015 Document Reviewed: 01/25/2014 Elsevier Interactive Patient Education  2017 [employer].

## 2019-07-22 LAB — URINE CULTURE

## 2019-07-23 ENCOUNTER — Other Ambulatory Visit: Payer: Self-pay | Admitting: Internal Medicine

## 2019-07-23 DIAGNOSIS — L658 Other specified nonscarring hair loss: Secondary | ICD-10-CM | POA: Diagnosis not present

## 2019-07-23 DIAGNOSIS — L659 Nonscarring hair loss, unspecified: Secondary | ICD-10-CM | POA: Diagnosis not present

## 2019-07-23 MED ORDER — NITROFURANTOIN MONOHYD MACRO 100 MG PO CAPS
100.0000 mg | ORAL_CAPSULE | Freq: Two times a day (BID) | ORAL | 0 refills | Status: AC
Start: 2019-07-23 — End: 2019-07-28

## 2019-07-24 LAB — BMP8+EGFR
BUN/Creatinine Ratio: 18 (ref 12–28)
BUN: 14 mg/dL (ref 8–27)
CO2: 22 mmol/L (ref 20–29)
Calcium: 10.2 mg/dL (ref 8.7–10.3)
Chloride: 107 mmol/L — ABNORMAL HIGH (ref 96–106)
Creatinine, Ser: 0.8 mg/dL (ref 0.57–1.00)
GFR calc Af Amer: 83 mL/min/{1.73_m2} (ref >59)
GFR calc non Af Amer: 72 mL/min/{1.73_m2} (ref >59)
Glucose: 95 mg/dL (ref 65–99)
Potassium: 4.5 mmol/L (ref 3.5–5.2)
Sodium: 144 mmol/L (ref 134–144)

## 2019-07-24 LAB — ALDOSTERONE + RENIN ACTIVITY W/ RATIO
ALDOS/RENIN RATIO: 2 (ref 0.0–30.0)
ALDOSTERONE: 2.1 ng/dL (ref 0.0–30.0)
Renin: 1.035 ng/mL/hr (ref 0.167–5.380)

## 2019-08-28 DIAGNOSIS — H25013 Cortical age-related cataract, bilateral: Secondary | ICD-10-CM | POA: Diagnosis not present

## 2019-08-28 DIAGNOSIS — H401131 Primary open-angle glaucoma, bilateral, mild stage: Secondary | ICD-10-CM | POA: Diagnosis not present

## 2019-09-20 ENCOUNTER — Ambulatory Visit (INDEPENDENT_AMBULATORY_CARE_PROVIDER_SITE_OTHER): Payer: Medicare Other | Admitting: Internal Medicine

## 2019-09-20 ENCOUNTER — Encounter: Payer: Self-pay | Admitting: Internal Medicine

## 2019-09-20 ENCOUNTER — Other Ambulatory Visit: Payer: Self-pay

## 2019-09-20 VITALS — BP 132/78 | HR 60 | Temp 98.1°F | Ht 63.0 in | Wt 162.6 lb

## 2019-09-20 DIAGNOSIS — E7800 Pure hypercholesterolemia, unspecified: Secondary | ICD-10-CM | POA: Diagnosis not present

## 2019-09-20 DIAGNOSIS — I10 Essential (primary) hypertension: Secondary | ICD-10-CM | POA: Diagnosis not present

## 2019-09-20 NOTE — Progress Notes (Signed)
I,Katawbba Wiggins,acting as a Education administrator for Maximino Greenland, MD.,have documented all relevant documentation on the behalf of Maximino Greenland, MD,as directed by  Maximino Greenland, MD while in the presence of Maximino Greenland, MD.  This visit occurred during the SARS-CoV-2 public health emergency.  Safety protocols were in place, including screening questions prior to the visit, additional usage of staff PPE, and extensive cleaning of exam room while observing appropriate contact time as indicated for disinfecting solutions.  Subjective:     Patient ID: Marissa Orozco , female    DOB: 10/17/1945 , 74 y.o.   MRN: [id number]   Chief Complaint  Patient presents with  . Hypertension    HPI  The patient is here for a follow-up on her blood pressure today.  She reports compliance with meds. She denies headaches, chest pain and shortness of breath.   Hypertension This is a chronic problem. The current episode started more than 1 year ago. The problem is controlled. Pertinent negatives include no blurred vision, chest pain, palpitations or shortness of breath. Risk factors for coronary artery disease include dyslipidemia and post-menopausal state. Past treatments include ACE inhibitors. The current treatment provides moderate improvement.     Past Medical History:  Diagnosis Date  . Breast lump   . Colon polyps   . Fibroids   . GERD (gastroesophageal reflux disease)   . Glaucoma   . Hyperlipidemia 1986  . Hypertension 1986  . Interstitial cystitis 1999  . Kidney stones 2005  . Osteoporosis   . Tonsillitis      Family History  Problem Relation Age of Onset  . Coronary artery disease Father        mother, PGM  . Stroke Father   . Hypertension Father   . Colon cancer Father 28  . Hypertension Mother   . Heart disease Mother   . Colon cancer Mother 33  . Diabetes Other   . Colon cancer Maternal Grandmother   . Breast cancer Neg Hx      Current Outpatient Medications:  .   amLODipine (NORVASC) 5 MG tablet, Take 1 tablet (5 mg total) by mouth daily., Disp: 90 tablet, Rfl: 2 .  CALCIUM PO, Take 1 tablet by mouth daily. 500 mg daily, Disp: , Rfl:  .  Cholecalciferol (VITAMIN D PO), Take 1 tablet by mouth daily. 2000 units daily, Disp: , Rfl:  .  finasteride (PROSCAR) 5 MG tablet, Take 5 mg by mouth daily. , Disp: , Rfl:  .  latanoprost (XALATAN) 0.005 % ophthalmic solution, Place 1 drop into both eyes daily., Disp: , Rfl:  .  lisinopril (ZESTRIL) 10 MG tablet, TAKE 1 TABLET EVERY DAY, Disp: 90 tablet, Rfl: 1 .  pravastatin (PRAVACHOL) 80 MG tablet, TAKE 1 TABLET EVERY DAY, Disp: 90 tablet, Rfl: 1   Allergies  Allergen Reactions  . Hctz [Hydrochlorothiazide] Itching     Review of Systems  Constitutional: Negative.   Eyes: Negative for blurred vision.  Respiratory: Negative.  Negative for shortness of breath.   Cardiovascular: Negative.  Negative for chest pain and palpitations.  Gastrointestinal: Negative.   Psychiatric/Behavioral: Negative.   All other systems reviewed and are negative.    Today's Vitals   09/20/19 1520  BP: 132/78  Pulse: 60  Temp: 98.1 F (36.7 C)  TempSrc: Oral  Weight: 162 lb 9.6 oz (73.8 kg)  Height: 5\' 3"  (1.6 m)  PainSc: 0-No pain   Body mass index is 28.8 kg/m.  Wt Readings  from Last 3 Encounters:  09/20/19 162 lb 9.6 oz (73.8 kg)  07/19/19 162 lb (73.5 kg)  07/19/19 162 lb (73.5 kg)   Objective:  Physical Exam Vitals and nursing note reviewed.  Constitutional:      Appearance: Normal appearance. She is obese.  HENT:     Head: Normocephalic and atraumatic.  Cardiovascular:     Rate and Rhythm: Normal rate and regular rhythm.     Heart sounds: Normal heart sounds.  Pulmonary:     Breath sounds: Normal breath sounds.  Skin:    General: Skin is warm.  Neurological:     General: No focal deficit present.     Mental Status: She is alert and oriented to person, place, and time.         Assessment And Plan:      1. Essential hypertension Comments: Uncontrolled. Home BP log reviewed, at least twice weekly BP is in 140/90s. I will increase lisinopril to 20mg  once daily. She will take 2 10mg  lisinopril tabs until she runs out. She will rto in 2-3 weeks for re-evaluation. I will check BMP at that time.   2. Pure hypercholesterolemia  Chronic, she will continue with pravastatin. Last LDL reviewed, not yet at goal (140). This was done in Nov 2020. I will check levels at her next visit.   Patient was given opportunity to ask questions. Patient verbalized understanding of the plan and was able to repeat key elements of the plan. All questions were answered to their satisfaction.  Maximino Greenland, MD   I, Maximino Greenland, MD, have reviewed all documentation for this visit. The documentation on 10/01/19 for the exam, diagnosis, procedures, and orders are all accurate and complete.  THE PATIENT IS ENCOURAGED TO PRACTICE SOCIAL DISTANCING DUE TO THE COVID-19 PANDEMIC.

## 2019-09-20 NOTE — Patient Instructions (Signed)

## 2019-09-28 DIAGNOSIS — E785 Hyperlipidemia, unspecified: Secondary | ICD-10-CM | POA: Diagnosis not present

## 2019-09-28 DIAGNOSIS — Z760 Encounter for issue of repeat prescription: Secondary | ICD-10-CM | POA: Diagnosis not present

## 2019-09-28 DIAGNOSIS — I10 Essential (primary) hypertension: Secondary | ICD-10-CM | POA: Diagnosis not present

## 2019-10-17 ENCOUNTER — Ambulatory Visit (INDEPENDENT_AMBULATORY_CARE_PROVIDER_SITE_OTHER): Payer: Medicare Other | Admitting: Internal Medicine

## 2019-10-17 ENCOUNTER — Other Ambulatory Visit: Payer: Self-pay

## 2019-10-17 ENCOUNTER — Encounter: Payer: Self-pay | Admitting: Internal Medicine

## 2019-10-17 VITALS — BP 114/76 | HR 61 | Temp 97.9°F | Ht 63.0 in | Wt 163.8 lb

## 2019-10-17 DIAGNOSIS — Z23 Encounter for immunization: Secondary | ICD-10-CM | POA: Diagnosis not present

## 2019-10-17 DIAGNOSIS — I10 Essential (primary) hypertension: Secondary | ICD-10-CM

## 2019-10-17 MED ORDER — LISINOPRIL 20 MG PO TABS
20.0000 mg | ORAL_TABLET | Freq: Every day | ORAL | 2 refills | Status: DC
Start: 2019-10-17 — End: 2020-05-01

## 2019-10-17 MED ORDER — PRAVASTATIN SODIUM 80 MG PO TABS: 80.0000 mg | ORAL_TABLET | Freq: Every day | ORAL | 2 refills | Status: DC

## 2019-10-17 NOTE — Progress Notes (Signed)
I,Katawbba Wiggins,acting as a Education administrator for Maximino Greenland, MD.,have documented all relevant documentation on the behalf of Maximino Greenland, MD,a  This visit occurred during the SARS-CoV-2 public health emergency.  Safety protocols were in place, including screening questions prior to the visit, additional usage of staff PPE, and extensive cleaning of exam room while observing appropriate contact time as indicated for disinfecting solutions.  Subjective:     Patient ID: Marissa Orozco , female    DOB: 1945-12-14 , 74 y.o.   MRN: [id number]   Chief Complaint  Patient presents with  . Hypertension    HPI  The patient is here for a follow-up on her blood pressure today.   She reports compliance with meds. Denies headaches, chest pain and shortness of breath.   Hypertension This is a chronic problem. The current episode started more than 1 year ago. The problem is controlled. Pertinent negatives include no blurred vision, chest pain, palpitations or shortness of breath. Risk factors for coronary artery disease include dyslipidemia and post-menopausal state. Past treatments include ACE inhibitors. The current treatment provides moderate improvement.     Past Medical History:  Diagnosis Date  . Breast lump   . Colon polyps   . Fibroids   . GERD (gastroesophageal reflux disease)   . Glaucoma   . Hyperlipidemia 1986  . Hypertension 1986  . Interstitial cystitis 1999  . Kidney stones 2005  . Osteoporosis   . Tonsillitis      Family History  Problem Relation Age of Onset  . Coronary artery disease Father        mother, PGM  . Stroke Father   . Hypertension Father   . Colon cancer Father 60  . Hypertension Mother   . Heart disease Mother   . Colon cancer Mother 70  . Diabetes Other   . Colon cancer Maternal Grandmother   . Breast cancer Neg Hx      Current Outpatient Medications:  .  amLODipine (NORVASC) 5 MG tablet, Take 1 tablet (5 mg total) by mouth daily., Disp: 90  tablet, Rfl: 2 .  CALCIUM PO, Take 1 tablet by mouth daily. 500 mg daily, Disp: , Rfl:  .  Cholecalciferol (VITAMIN D PO), Take 1 tablet by mouth daily. 2000 units daily, Disp: , Rfl:  .  finasteride (PROSCAR) 5 MG tablet, Take 5 mg by mouth daily. , Disp: , Rfl:  .  latanoprost (XALATAN) 0.005 % ophthalmic solution, Place 1 drop into both eyes daily., Disp: , Rfl:  .  pravastatin (PRAVACHOL) 80 MG tablet, Take 1 tablet (80 mg total) by mouth daily., Disp: 90 tablet, Rfl: 2 .  lisinopril (ZESTRIL) 20 MG tablet, Take 1 tablet (20 mg total) by mouth daily., Disp: 90 tablet, Rfl: 2   Allergies  Allergen Reactions  . Hctz [Hydrochlorothiazide] Itching     Review of Systems  Constitutional: Negative.   Eyes: Negative for blurred vision.  Respiratory: Negative.  Negative for shortness of breath.   Cardiovascular: Negative.  Negative for chest pain and palpitations.  Gastrointestinal: Negative.   Psychiatric/Behavioral: Negative.   All other systems reviewed and are negative.    Today's Vitals   10/17/19 1409  BP: 114/76  Pulse: 61  Temp: 97.9 F (36.6 C)  TempSrc: Oral  Weight: 163 lb 12.8 oz (74.3 kg)  Height: '5\' 3"'  (1.6 m)  PainSc: 0-No pain   Body mass index is 29.02 kg/m.  Wt Readings from Last 3 Encounters:  10/17/19 163 lb  12.8 oz (74.3 kg)  09/20/19 162 lb 9.6 oz (73.8 kg)  07/19/19 162 lb (73.5 kg)   Objective:  Physical Exam Vitals and nursing note reviewed.  Constitutional:      Appearance: Normal appearance.  HENT:     Head: Normocephalic and atraumatic.  Cardiovascular:     Rate and Rhythm: Normal rate and regular rhythm.     Heart sounds: Normal heart sounds.  Pulmonary:     Breath sounds: Normal breath sounds.  Skin:    General: Skin is warm.  Neurological:     General: No focal deficit present.     Mental Status: She is alert and oriented to person, place, and time.         Assessment And Plan:     1. HTN (hypertension), malignant Comments:  Chronic, controlled. BP log reviewed. She will continue with current meds. Refills sent to pharmacy. I will check renal function today.  - BMP8+EGFR  2. Need for vaccination - Flu Vaccine QUAD High Dose(Fluad)     Patient was given opportunity to ask questions. Patient verbalized understanding of the plan and was able to repeat key elements of the plan. All questions were answered to their satisfaction.  Maximino Greenland, MD   I, Maximino Greenland, MD, have reviewed all documentation for this visit. The documentation on 10/20/19 for the exam, diagnosis, procedures, and orders are all accurate and complete.  THE PATIENT IS ENCOURAGED TO PRACTICE SOCIAL DISTANCING DUE TO THE COVID-19 PANDEMIC.  s directed by  Maximino Greenland, MD while in the presence of Maximino Greenland, MD.

## 2019-10-17 NOTE — Patient Instructions (Signed)

## 2019-10-18 DIAGNOSIS — I10 Essential (primary) hypertension: Secondary | ICD-10-CM | POA: Diagnosis not present

## 2019-10-19 LAB — BMP8+EGFR
BUN/Creatinine Ratio: 20 (ref 12–28)
BUN: 15 mg/dL (ref 8–27)
CO2: 21 mmol/L (ref 20–29)
Calcium: 9.7 mg/dL (ref 8.7–10.3)
Chloride: 104 mmol/L (ref 96–106)
Creatinine, Ser: 0.76 mg/dL (ref 0.57–1.00)
GFR calc Af Amer: 89 mL/min/{1.73_m2} (ref >59)
GFR calc non Af Amer: 77 mL/min/{1.73_m2} (ref >59)
Glucose: 91 mg/dL (ref 65–99)
Potassium: 4.5 mmol/L (ref 3.5–5.2)
Sodium: 141 mmol/L (ref 134–144)

## 2019-11-02 ENCOUNTER — Ambulatory Visit (INDEPENDENT_AMBULATORY_CARE_PROVIDER_SITE_OTHER): Payer: Medicare Other

## 2019-11-02 DIAGNOSIS — Z23 Encounter for immunization: Secondary | ICD-10-CM | POA: Diagnosis not present

## 2019-11-02 NOTE — Progress Notes (Signed)
   [facility]ination [facility]  Name:  ERIA LOZOYA    MRN: [id number] DOB: May 27, 1945  11/02/2019  Ms. Waldo was observed post Covid-19 immunization for 15 minutes without incident. She was provided with Vaccine Information Sheet and instruction to access the V-Safe system.   Ms. Parma was instructed to call 911 with any severe reactions post vaccine: Marland Kitchen Difficulty breathing  . Swelling of face and throat  . A fast heartbeat  . A bad rash all over body  . Dizziness and weakness

## 2019-11-07 ENCOUNTER — Other Ambulatory Visit: Payer: Self-pay | Admitting: Internal Medicine

## 2019-11-07 DIAGNOSIS — Z1231 Encounter for screening mammogram for malignant neoplasm of breast: Secondary | ICD-10-CM

## 2020-01-01 DIAGNOSIS — H5213 Myopia, bilateral: Secondary | ICD-10-CM | POA: Diagnosis not present

## 2020-01-01 DIAGNOSIS — H401131 Primary open-angle glaucoma, bilateral, mild stage: Secondary | ICD-10-CM | POA: Diagnosis not present

## 2020-01-01 DIAGNOSIS — H2513 Age-related nuclear cataract, bilateral: Secondary | ICD-10-CM | POA: Diagnosis not present

## 2020-01-01 DIAGNOSIS — H25813 Combined forms of age-related cataract, bilateral: Secondary | ICD-10-CM | POA: Diagnosis not present

## 2020-01-08 ENCOUNTER — Ambulatory Visit
Admission: RE | Admit: 2020-01-08 | Discharge: 2020-01-08 | Disposition: A | Discharge status: Home / Self Care | Payer: Medicare Other | Source: Ambulatory Visit | Attending: Internal Medicine | Admitting: Internal Medicine

## 2020-01-08 ENCOUNTER — Other Ambulatory Visit: Payer: Self-pay

## 2020-01-08 DIAGNOSIS — Z1231 Encounter for screening mammogram for malignant neoplasm of breast: Secondary | ICD-10-CM

## 2020-01-24 ENCOUNTER — Ambulatory Visit: Payer: Medicare Other | Admitting: Internal Medicine

## 2020-02-05 HISTORY — PX: CATARACT EXTRACTION: SUR2

## 2020-02-26 ENCOUNTER — Ambulatory Visit: Payer: Medicare Other | Admitting: Internal Medicine

## 2020-02-26 DIAGNOSIS — H25011 Cortical age-related cataract, right eye: Secondary | ICD-10-CM | POA: Diagnosis not present

## 2020-02-26 DIAGNOSIS — H25811 Combined forms of age-related cataract, right eye: Secondary | ICD-10-CM | POA: Diagnosis not present

## 2020-02-26 DIAGNOSIS — H2511 Age-related nuclear cataract, right eye: Secondary | ICD-10-CM | POA: Diagnosis not present

## 2020-04-08 ENCOUNTER — Ambulatory Visit: Payer: Medicare Other | Admitting: Internal Medicine

## 2020-04-17 ENCOUNTER — Ambulatory Visit: Payer: Medicare Other | Admitting: Internal Medicine

## 2020-04-23 ENCOUNTER — Other Ambulatory Visit: Payer: Self-pay | Admitting: Internal Medicine

## 2020-05-01 ENCOUNTER — Other Ambulatory Visit: Payer: Self-pay

## 2020-05-01 ENCOUNTER — Encounter: Payer: Self-pay | Admitting: Internal Medicine

## 2020-05-01 ENCOUNTER — Ambulatory Visit (INDEPENDENT_AMBULATORY_CARE_PROVIDER_SITE_OTHER): Payer: Medicare Other | Admitting: Internal Medicine

## 2020-05-01 VITALS — BP 134/86 | HR 62 | Temp 97.8°F | Ht 63.0 in | Wt 161.0 lb

## 2020-05-01 DIAGNOSIS — Z79899 Other long term (current) drug therapy: Secondary | ICD-10-CM

## 2020-05-01 DIAGNOSIS — E7800 Pure hypercholesterolemia, unspecified: Secondary | ICD-10-CM

## 2020-05-01 DIAGNOSIS — I10 Essential (primary) hypertension: Secondary | ICD-10-CM | POA: Diagnosis not present

## 2020-05-01 MED ORDER — PRAVASTATIN SODIUM 80 MG PO TABS: 80.0000 mg | ORAL_TABLET | Freq: Every day | ORAL | 2 refills | Status: DC

## 2020-05-01 MED ORDER — LISINOPRIL 20 MG PO TABS: 20.0000 mg | ORAL_TABLET | Freq: Every day | ORAL | 2 refills | Status: DC

## 2020-05-01 NOTE — Progress Notes (Signed)
I,Katawbba Wiggins,acting as a Education administrator for Maximino Greenland, MD.,have documented all relevant documentation on the behalf of Maximino Greenland, MD,as directed by  Maximino Greenland, MD while in the presence of Maximino Greenland, MD.  This visit occurred during the SARS-CoV-2 public health emergency.  Safety protocols were in place, including screening questions prior to the visit, additional usage of staff PPE, and extensive cleaning of exam room while observing appropriate contact time as indicated for disinfecting solutions.  Subjective:     Patient ID: Marissa Orozco , female    DOB: 03/10/1945 , 75 y.o.   MRN: [id number]   Chief Complaint  Patient presents with  . Hypertension    HPI  The patient is here for a follow-up on her blood pressure today.   She brought in a log of her home readings. She reports compliance with meds. She denies headaches, chest pain and shortness of breath.   Hypertension This is a chronic problem. The current episode started more than 1 year ago. The problem is controlled. Pertinent negatives include no blurred vision, chest pain, palpitations or shortness of breath. Risk factors for coronary artery disease include dyslipidemia and post-menopausal state. Past treatments include ACE inhibitors. The current treatment provides moderate improvement.     Past Medical History:  Diagnosis Date  . Breast lump   . Colon polyps   . Fibroids   . GERD (gastroesophageal reflux disease)   . Glaucoma   . Hyperlipidemia 1986  . Hypertension 1986  . Interstitial cystitis 1999  . Kidney stones 2005  . Osteoporosis   . Tonsillitis      Family History  Problem Relation Age of Onset  . Coronary artery disease Father        mother, PGM  . Stroke Father   . Hypertension Father   . Colon cancer Father 71  . Hypertension Mother   . Heart disease Mother   . Colon cancer Mother 28  . Diabetes Other   . Colon cancer Maternal Grandmother   . Breast cancer Neg Hx       Current Outpatient Medications:  .  amLODipine (NORVASC) 5 MG tablet, TAKE 1 TABLET EVERY DAY, Disp: 90 tablet, Rfl: 2 .  CALCIUM PO, Take 1 tablet by mouth daily. 500 mg daily, Disp: , Rfl:  .  Cholecalciferol (VITAMIN D PO), Take 1 tablet by mouth daily. 2000 units daily, Disp: , Rfl:  .  dorzolamide-timolol (COSOPT) 22.3-6.8 MG/ML ophthalmic solution, , Disp: , Rfl:  .  DUREZOL 0.05 % EMUL, , Disp: , Rfl:  .  finasteride (PROSCAR) 5 MG tablet, Take 5 mg by mouth daily., Disp: , Rfl:  .  latanoprost (XALATAN) 0.005 % ophthalmic solution, Place 1 drop into both eyes daily., Disp: , Rfl:  .  moxifloxacin (VIGAMOX) 0.5 % ophthalmic solution, , Disp: , Rfl:  .  lisinopril (ZESTRIL) 20 MG tablet, Take 1 tablet (20 mg total) by mouth daily., Disp: 90 tablet, Rfl: 2 .  pravastatin (PRAVACHOL) 80 MG tablet, Take 1 tablet (80 mg total) by mouth daily., Disp: 90 tablet, Rfl: 2   Allergies  Allergen Reactions  . Hctz [Hydrochlorothiazide] Itching     Review of Systems  Constitutional: Negative.   Eyes: Negative for blurred vision.  Respiratory: Negative.  Negative for shortness of breath.   Cardiovascular: Negative.  Negative for chest pain and palpitations.  Gastrointestinal: Negative.   Neurological: Negative.   Psychiatric/Behavioral: Negative.      Today's Vitals  05/01/20 1106  BP: 134/86  Pulse: 62  Temp: 97.8 F (36.6 C)  TempSrc: Oral  Weight: 161 lb (73 kg)  Height: _0  (1.6 m)   Body mass index is 28.52 kg/m.  Wt Readings from Last 3 Encounters:  05/01/20 161 lb (73 kg)  10/17/19 163 lb 12.8 oz (74.3 kg)  09/20/19 162 lb 9.6 oz (73.8 kg)   Objective:  Physical Exam Vitals and nursing note reviewed.  Constitutional:      Appearance: Normal appearance.  HENT:     Head: Normocephalic and atraumatic.     Nose:     Comments: Masked     Mouth/Throat:     Comments: Masked  Eyes:     Extraocular Movements: Extraocular movements intact.  Cardiovascular:      Rate and Rhythm: Normal rate and regular rhythm.     Heart sounds: Normal heart sounds.  Pulmonary:     Effort: Pulmonary effort is normal.     Breath sounds: Normal breath sounds.  Musculoskeletal:     Cervical back: Normal range of motion.  Skin:    General: Skin is warm.  Neurological:     General: No focal deficit present.     Mental Status: She is alert.  Psychiatric:        Mood and Affect: Mood normal.        Behavior: Behavior normal.         Assessment And Plan:     1. HTN (hypertension), malignant Comments: Chronic, fair control. Home BP logs reviewed. Home readings are between 107/74-145/85. She will c/w current meds for now. Advised to follow low sodium diet. - CMP14+EGFR - Lipid panel - CBC no Diff  2. Pure hypercholesterolemia Comments: Previous LDL results reviewed in detail. She will c/w pravastatin 22m daily. I will check LFTs today. - CMP14+EGFR - Lipid panel  3. Drug therapy  Patient was given opportunity to ask questions. Patient verbalized understanding of the plan and was able to repeat key elements of the plan. All questions were answered to their satisfaction.   I, RMaximino Greenland MD, have reviewed all documentation for this visit. The documentation on 05/01/20 for the exam, diagnosis, procedures, and orders are all accurate and complete.   IF YOU HAVE BEEN REFERRED TO A SPECIALIST, IT MAY TAKE 1-2 WEEKS TO SCHEDULE/PROCESS THE REFERRAL. IF YOU HAVE NOT HEARD FROM US/SPECIALIST IN TWO WEEKS, PLEASE GIVE [place] CALL AT [phone].   THE PATIENT IS ENCOURAGED TO PRACTICE SOCIAL DISTANCING DUE TO THE COVID-19 PANDEMIC.

## 2020-05-01 NOTE — Patient Instructions (Signed)

## 2020-05-02 LAB — CMP14+EGFR
ALT: 17 IU/L (ref 0–32)
AST: 17 IU/L (ref 0–40)
Albumin/Globulin Ratio: 1.6 (ref 1.2–2.2)
Albumin: 4 g/dL (ref 3.7–4.7)
Alkaline Phosphatase: 79 IU/L (ref 44–121)
BUN/Creatinine Ratio: 21 (ref 12–28)
BUN: 15 mg/dL (ref 8–27)
Bilirubin Total: 0.9 mg/dL (ref 0.0–1.2)
CO2: 22 mmol/L (ref 20–29)
Calcium: 9.7 mg/dL (ref 8.7–10.3)
Chloride: 108 mmol/L — ABNORMAL HIGH (ref 96–106)
Creatinine, Ser: 0.71 mg/dL (ref 0.57–1.00)
Globulin, Total: 2.5 g/dL (ref 1.5–4.5)
Glucose: 92 mg/dL (ref 65–99)
Potassium: 4.4 mmol/L (ref 3.5–5.2)
Sodium: 145 mmol/L — ABNORMAL HIGH (ref 134–144)
Total Protein: 6.5 g/dL (ref 6.0–8.5)
eGFR: 88 mL/min/{1.73_m2} (ref >59)

## 2020-05-02 LAB — CBC
Hematocrit: 40.6 % (ref 34.0–46.6)
Hemoglobin: 13.7 g/dL (ref 11.1–15.9)
MCH: 31.1 pg (ref 26.6–33.0)
MCHC: 33.7 g/dL (ref 31.5–35.7)
MCV: 92 fL (ref 79–97)
Platelets: 289 10*3/uL (ref 150–450)
RBC: 4.41 x10E6/uL (ref 3.77–5.28)
RDW: 13.4 % (ref 11.7–15.4)
WBC: 6.8 10*3/uL (ref 3.4–10.8)

## 2020-05-02 LAB — LIPID PANEL
Chol/HDL Ratio: 4.1 ratio (ref 0.0–4.4)
Cholesterol, Total: 231 mg/dL — ABNORMAL HIGH (ref 100–199)
HDL: 57 mg/dL (ref >39)
LDL Chol Calc (NIH): 148 mg/dL — ABNORMAL HIGH (ref 0–99)
Triglycerides: 143 mg/dL (ref 0–149)
VLDL Cholesterol Cal: 26 mg/dL (ref 5–40)

## 2020-05-06 DIAGNOSIS — H25812 Combined forms of age-related cataract, left eye: Secondary | ICD-10-CM | POA: Diagnosis not present

## 2020-05-06 DIAGNOSIS — H2512 Age-related nuclear cataract, left eye: Secondary | ICD-10-CM | POA: Diagnosis not present

## 2020-05-06 DIAGNOSIS — H25012 Cortical age-related cataract, left eye: Secondary | ICD-10-CM | POA: Diagnosis not present

## 2020-05-08 ENCOUNTER — Encounter: Payer: Self-pay | Admitting: Internal Medicine

## 2020-05-12 DIAGNOSIS — L659 Nonscarring hair loss, unspecified: Secondary | ICD-10-CM | POA: Diagnosis not present

## 2020-05-12 DIAGNOSIS — L308 Other specified dermatitis: Secondary | ICD-10-CM | POA: Diagnosis not present

## 2020-05-12 DIAGNOSIS — L658 Other specified nonscarring hair loss: Secondary | ICD-10-CM | POA: Diagnosis not present

## 2020-05-12 DIAGNOSIS — L853 Xerosis cutis: Secondary | ICD-10-CM | POA: Diagnosis not present

## 2020-05-23 ENCOUNTER — Encounter: Payer: Self-pay | Admitting: Internal Medicine

## 2020-06-11 ENCOUNTER — Encounter: Payer: Self-pay | Admitting: Internal Medicine

## 2020-06-11 ENCOUNTER — Ambulatory Visit (INDEPENDENT_AMBULATORY_CARE_PROVIDER_SITE_OTHER): Payer: Medicare Other | Admitting: Internal Medicine

## 2020-06-11 ENCOUNTER — Other Ambulatory Visit: Payer: Self-pay

## 2020-06-11 VITALS — BP 130/82 | HR 61 | Temp 98.0°F | Ht 63.0 in | Wt 160.4 lb

## 2020-06-11 DIAGNOSIS — E7800 Pure hypercholesterolemia, unspecified: Secondary | ICD-10-CM

## 2020-06-11 DIAGNOSIS — E663 Overweight: Secondary | ICD-10-CM | POA: Diagnosis not present

## 2020-06-11 DIAGNOSIS — I10 Essential (primary) hypertension: Secondary | ICD-10-CM

## 2020-06-11 DIAGNOSIS — Z6828 Body mass index (BMI) 28.0-28.9, adult: Secondary | ICD-10-CM | POA: Diagnosis not present

## 2020-06-11 NOTE — Progress Notes (Signed)
I,Katawbba Wiggins,acting as a Education administrator for Maximino Greenland, MD.,have documented all relevant documentation on the behalf of Maximino Greenland, MD,as directed by  Maximino Greenland, MD while in the presence of Maximino Greenland, MD.  This visit occurred during the SARS-CoV-2 public health emergency.  Safety protocols were in place, including screening questions prior to the visit, additional usage of staff PPE, and extensive cleaning of exam room while observing appropriate contact time as indicated for disinfecting solutions.  Subjective:     Patient ID: Marissa Orozco , female    DOB: [birth year] , 75 y.o.   MRN: [id number]   Chief Complaint  Patient presents with   Hypertension    HPI  The patient is here for a follow-up on her blood pressure today.   She reports compliance with meds. She keeps BP log. She states her BP was elevated for four days 142-156/92-96. She reports not having any symptoms during this time. Denies change in appetite/sleep habits. BP has since gone back to normal.  She is not sure what may have attributed to her elevated BP.   Hypertension This is a chronic problem. The current episode started more than 1 year ago. The problem is controlled. Pertinent negatives include no blurred vision, chest pain, palpitations or shortness of breath. Risk factors for coronary artery disease include dyslipidemia and post-menopausal state. Past treatments include ACE inhibitors. The current treatment provides moderate improvement.    Past Medical History:  Diagnosis Date   Breast lump    Colon polyps    Fibroids    GERD (gastroesophageal reflux disease)    Glaucoma    Hyperlipidemia 1986   Hypertension 1986   Interstitial cystitis 1999   Kidney stones 2005   Osteoporosis    Tonsillitis      Family History  Problem Relation Age of Onset   Coronary artery disease Father        mother, PGM   Stroke Father    Hypertension Father    Colon cancer Father 37   Hypertension Mother     Heart disease Mother    Colon cancer Mother 49   Diabetes Other    Colon cancer Maternal Grandmother    Breast cancer Neg Hx      Current Outpatient Medications:    amLODipine (NORVASC) 5 MG tablet, TAKE 1 TABLET EVERY DAY, Disp: 90 tablet, Rfl: 2   CALCIUM PO, Take 1 tablet by mouth daily. 500 mg daily, Disp: , Rfl:    Cholecalciferol (VITAMIN D PO), Take 1 tablet by mouth daily. 2000 units daily, Disp: , Rfl:    dorzolamide-timolol (COSOPT) 22.3-6.8 MG/ML ophthalmic solution, , Disp: , Rfl:    finasteride (PROSCAR) 5 MG tablet, Take 5 mg by mouth daily., Disp: , Rfl:    latanoprost (XALATAN) 0.005 % ophthalmic solution, Place 1 drop into both eyes daily., Disp: , Rfl:    lisinopril (ZESTRIL) 20 MG tablet, Take 1 tablet (20 mg total) by mouth daily., Disp: 90 tablet, Rfl: 2   pravastatin (PRAVACHOL) 80 MG tablet, Take 1 tablet (80 mg total) by mouth daily., Disp: 90 tablet, Rfl: 2   Allergies  Allergen Reactions   Hctz [Hydrochlorothiazide] Itching     Review of Systems  Constitutional: Negative.   Eyes:  Negative for blurred vision.  Respiratory: Negative.  Negative for shortness of breath.   Cardiovascular: Negative.  Negative for chest pain and palpitations.  Gastrointestinal: Negative.   Psychiatric/Behavioral: Negative.    All other systems  reviewed and are negative.   Today's Vitals   06/11/20 0846  BP: 130/82  Pulse: 61  Temp: 98 F (36.7 C)  TempSrc: Oral  Weight: 160 lb 6.4 oz (72.8 kg)  Height: 5\' 3"  (1.6 m)  PainSc: 0-No pain   Body mass index is 28.41 kg/m.  Wt Readings from Last 3 Encounters:  06/11/20 160 lb 6.4 oz (72.8 kg)  05/01/20 161 lb (73 kg)  10/17/19 163 lb 12.8 oz (74.3 kg)   BP Readings from Last 3 Encounters:  06/11/20 130/82  05/01/20 134/86  10/17/19 114/76   Objective:  Physical Exam Vitals and nursing note reviewed.  Constitutional:      Appearance: Normal appearance.  HENT:     Head: Normocephalic and atraumatic.      Nose:     Comments: Masked     Mouth/Throat:     Comments: Masked  Cardiovascular:     Rate and Rhythm: Normal rate and regular rhythm.     Heart sounds: Normal heart sounds.  Pulmonary:     Effort: Pulmonary effort is normal.     Breath sounds: Normal breath sounds.  Musculoskeletal:     Cervical back: Normal range of motion.  Skin:    General: Skin is warm.  Neurological:     General: No focal deficit present.     Mental Status: She is alert.  Psychiatric:        Mood and Affect: Mood normal.        Behavior: Behavior normal.        Assessment And Plan:     1. HTN (hypertension), malignant Comments: Chronic, BP log reviewed.   2. Pure hypercholesterolemia Comments: I will recheck in Nov. She is aware of current LDL and advised goal is less than 100. She will c/w pravastatin 80mg  daily.   3. Overweight with body mass index (BMI) of 28 to 28.9 in adult Comments: Her BMI is acceptable for her demographic. Encouraged to c/w her regular exercise regimen.   Patient was given opportunity to ask questions. Patient verbalized understanding of the plan and was able to repeat key elements of the plan. All questions were answered to their satisfaction.   I, Maximino Greenland, MD, have reviewed all documentation for this visit. The documentation on 06/11/20 for the exam, diagnosis, procedures, and orders are all accurate and complete.   IF YOU HAVE BEEN REFERRED TO A SPECIALIST, IT MAY TAKE 1-2 WEEKS TO SCHEDULE/PROCESS THE REFERRAL. IF YOU HAVE NOT HEARD FROM US/SPECIALIST IN TWO WEEKS, PLEASE GIVE [place] A CALL AT [phone].   THE PATIENT IS ENCOURAGED TO PRACTICE SOCIAL DISTANCING DUE TO THE COVID-19 PANDEMIC.

## 2020-06-11 NOTE — Patient Instructions (Signed)

## 2020-07-23 ENCOUNTER — Ambulatory Visit: Payer: Medicare Other | Admitting: Internal Medicine

## 2020-08-06 ENCOUNTER — Ambulatory Visit (INDEPENDENT_AMBULATORY_CARE_PROVIDER_SITE_OTHER): Payer: Medicare Other

## 2020-08-06 ENCOUNTER — Other Ambulatory Visit: Payer: Self-pay

## 2020-08-06 VITALS — BP 130/70 | HR 74 | Temp 98.3°F | Ht 63.0 in | Wt 163.8 lb

## 2020-08-06 DIAGNOSIS — Z0000 Encounter for general adult medical examination without abnormal findings: Secondary | ICD-10-CM

## 2020-08-06 NOTE — Progress Notes (Signed)
This visit occurred during the SARS-CoV-2 public health emergency.  Safety protocols were in place, including screening questions prior to the visit, additional usage of staff PPE, and extensive cleaning of exam room while observing appropriate contact time as indicated for disinfecting solutions.  Subjective:   Marissa Orozco is a 75 y.o. female who presents for Medicare Annual (Subsequent) preventive examination.  Review of Systems     Cardiac Risk Factors include: advanced age (>67mn, >>72women);hypertension     Objective:    Today's Vitals   08/06/20 1112  BP: 130/70  Pulse: 74  Temp: 98.3 F (36.8 C)  TempSrc: Oral  SpO2: 98%  Weight: 163 lb 12.8 oz (74.3 kg)  Height: '5\' 3"'$  (1.6 m)   Body mass index is 29.02 kg/m.  Advanced Directives 08/06/2020 07/19/2019 05/18/2018 05/27/2017 05/22/2013  Does Patient Have a Medical Advance Directive? Yes Yes Yes Yes Patient has advance directive, copy not in chart  Type of Advance Directive [place] will [place] will [place] will [place] Chart? No - copy requested No - copy requested No - copy requested - -    Current Medications (verified) Outpatient Encounter Medications as of 08/06/2020  Medication Sig   amLODipine (NORVASC) 5 MG tablet TAKE 1 TABLET EVERY DAY   CALCIUM PO Take 1 tablet by mouth daily. 500 mg daily   Cholecalciferol (VITAMIN D PO) Take 1 tablet by mouth daily. 2000 units daily   dorzolamide-timolol (COSOPT) 22.3-6.8 MG/ML ophthalmic solution    finasteride (PROSCAR) 5 MG tablet Take 5 mg by mouth daily.   latanoprost (XALATAN) 0.005 % ophthalmic solution Place 1 drop into both eyes daily.   lisinopril (ZESTRIL) 20 MG tablet Take 1 tablet (20 mg total) by mouth daily.   pravastatin (PRAVACHOL) 80 MG tablet Take 1 tablet (80 mg total) by  mouth daily.   No facility-administered encounter medications on file as of 08/06/2020.    Allergies (verified) Hctz [hydrochlorothiazide]   History: Past Medical History:  Diagnosis Date   Breast lump    Colon polyps    Fibroids    GERD (gastroesophageal reflux disease)    Glaucoma    Hyperlipidemia 1986   Hypertension 1986   Interstitial cystitis 1999   Kidney stones 2005   Osteoporosis    Tonsillitis    Past Surgical History:  Procedure Laterality Date   BREAST EXCISIONAL BIOPSY Right 07/27/2006   Intraductal Papilloma   CATARACT EXTRACTION Bilateral 02/2020   other in 05/2020   COLONOSCOPY  2010   Dr MCollene Mares  HYSTEROSCOPY  1993   LAPAROSCOPIC HYSTERECTOMY  1994   MYOMECTOMY  1993   excision of tumor   [facility]  Family History  Problem Relation Age of Onset   Coronary artery disease Father        mother, PGM   Stroke Father    Hypertension Father    Colon cancer Father 664  Hypertension Mother    Heart disease Mother    Colon cancer Mother 880  Diabetes Other    Colon cancer Maternal Grandmother    Breast cancer Neg Hx    Social History   Socioeconomic History   Marital status: Married    Spouse name: Not on file   Number of children: 2   Years of education: Not on file  Highest education level: Not on file  Occupational History   Occupation: retired  Tobacco Use   Smoking status: Never   Smokeless tobacco: Never  Vaping Use   Vaping Use: Never used  Substance and Sexual Activity   Alcohol use: Yes    Alcohol/week: 12.0 standard drinks    Types: 6 Standard drinks or equivalent, 6 Glasses of wine per week   Drug use: No   Sexual activity: Not Currently  Other Topics Concern   Not on file  Social History Narrative   Not on file   Social Determinants of Health   Financial Resource Strain: Low Risk    Difficulty of Paying Living Expenses: Not hard at all  Food Insecurity: No Food Insecurity   Worried About  Charity fundraiser in the Last Year: Never true   Ran Out of Food in the Last Year: Never true  Transportation Needs: No Transportation Needs   Lack of Transportation (Medical): No   Lack of Transportation (Non-Medical): No  Physical Activity: Sufficiently Active   Days of Exercise per Week: 4 days   Minutes of Exercise per Session: 40 min  Stress: No Stress Concern Present   Feeling of Stress : Not at all  Social Connections: Not on file    Tobacco Counseling Counseling given: Not Answered   Clinical Intake:  Pre-visit preparation completed: Yes  Pain : No/denies pain     Nutritional Status: BMI 25 -29 Overweight Nutritional Risks: None Diabetes: No  How often do you need to have someone help you when you read instructions, pamphlets, or other written materials from your doctor or pharmacy?: 1 - Never What is the last grade level you completed in school?: PhD  Diabetic? no  Interpreter Needed?: No  Information entered by :: NAllen LPN   Activities of Daily Living In your present state of health, do you have any difficulty performing the following activities: 08/06/2020  Hearing? N  Vision? N  Difficulty concentrating or making decisions? N  Walking or climbing stairs? N  Dressing or bathing? N  Doing errands, shopping? N  Preparing Food and eating ? N  Using the Toilet? N  In the past six months, have you accidently leaked urine? N  Do you have problems with loss of bowel control? N  Managing your Medications? N  Managing your Finances? N  Housekeeping or managing your Housekeeping? N  Some recent data might be hidden    Patient Care Team: Glendale Chard, MD as PCP - General (Internal Medicine)  Indicate any recent Medical Services you may have received from other than [facility] providers in the past year (date may be approximate).     Assessment:   This is a routine wellness examination for [place].  Hearing/Vision screen No results found.  Dietary  issues and exercise activities discussed: Current Exercise Habits: Home exercise routine, Type of exercise: treadmill;walking, Time (Minutes): 45, Frequency (Times/Week): 4, Weekly Exercise (Minutes/Week): 180   Goals Addressed             This Visit's Progress    Patient Stated       08/06/2020, wants to weigh 150 pounds       Depression Screen PHQ 2/9 Scores 08/06/2020 07/19/2019 05/18/2018 05/18/2018 11/02/2017  PHQ - 2 Score 0 0 0 0 0  PHQ- 9 Score - 1 0 - -    Fall Risk Fall Risk  08/06/2020 07/19/2019 11/20/2018 05/18/2018 05/18/2018  Falls in the past year? 0 0 0 0  0  Comment - - - - -  Risk for fall due to : Medication side effect Medication side effect - Medication side effect -  Follow up Falls evaluation completed;Education provided;Falls prevention discussed Falls evaluation completed;Education provided;Falls prevention discussed - Education provided -    FALL RISK PREVENTION PERTAINING TO THE HOME:  Any stairs in or around the home? Yes  If so, are there any without handrails? No  Home free of loose throw rugs in walkways, pet beds, electrical cords, etc? Yes  Adequate lighting in your home to reduce risk of falls? Yes   ASSISTIVE DEVICES UTILIZED TO PREVENT FALLS:  Life alert? No  Use of a cane, walker or w/c? No  Grab bars in the bathroom? No  Shower chair or bench in shower? Yes  Elevated toilet seat or a handicapped toilet? No   TIMED UP AND GO:  Was the test performed? No .    Gait steady and fast without use of assistive device  Cognitive Function:     6CIT Screen 08/06/2020 07/19/2019 05/18/2018  What Year? 0 points 0 points 0 points  What month? 0 points 0 points 0 points  What time? 0 points 0 points 0 points  Count back from 20 0 points 0 points 0 points  Months in reverse 0 points 0 points 0 points  Repeat phrase 2 points 0 points 0 points  Total Score 2 0 0    Immunizations Immunization History  Administered Date(s) Administered   Fluad  Quad(high Dose 65+) 10/17/2019   Influenza, High Dose Seasonal PF 11/20/2018   Influenza-Unspecified 09/02/2017   Moderna SARS-COV2 Booster Vaccination 11/02/2019   Moderna Sars-Covid-2 Vaccination 01/26/2019, 03/02/2019   Pneumococcal Polysaccharide-23 01/30/2018   Tdap 06/01/2012   Zoster Recombinat (Shingrix) 10/17/2012    TDAP status: Up to date  Flu Vaccine status: Due, Education has been provided regarding the importance of this vaccine. Advised may receive this vaccine at local pharmacy or Health Dept. Aware to provide a copy of the vaccination record if obtained from local pharmacy or Health Dept. Verbalized acceptance and understanding.  Pneumococcal vaccine status: Declined,  Education has been provided regarding the importance of this vaccine but patient still declined. Advised may receive this vaccine at local pharmacy or Health Dept. Aware to provide a copy of the vaccination record if obtained from local pharmacy or Health Dept. Verbalized acceptance and understanding.   Covid-19 vaccine status: Completed vaccines  Qualifies for Shingles Vaccine? Yes   Zostavax completed Yes   Shingrix Completed?: No.    Education has been provided regarding the importance of this vaccine. Patient has been advised to call insurance company to determine out of pocket expense if they have not yet received this vaccine. Advised may also receive vaccine at local pharmacy or Health Dept. Verbalized acceptance and understanding.  Screening Tests Health Maintenance  Topic Date Due   Zoster Vaccines- Shingrix (2 of 2) 12/12/2012   PNA vac Low Risk Adult (2 of 2 - PCV13) 01/31/2019   COVID-19 Vaccine (4 - Booster for Moderna series) 02/02/2020   INFLUENZA VACCINE  08/04/2020   COLONOSCOPY (Pts 45-40yr Insurance coverage will need to be confirmed)  05/28/2022   TETANUS/TDAP  06/02/2022   DEXA SCAN  Completed   Hepatitis C Screening  Completed   HPV VACCINES  Aged Out    Health  Maintenance  Health Maintenance Due  Topic Date Due   Zoster Vaccines- Shingrix (2 of 2) 12/12/2012   PNA vac Low Risk  Adult (2 of 2 - PCV13) 01/31/2019   COVID-19 Vaccine (4 - Booster for Moderna series) 02/02/2020   INFLUENZA VACCINE  08/04/2020    Colorectal cancer screening: Type of screening: Colonoscopy. Completed 05/27/2017. Repeat every 5 years  Mammogram status: Completed 01/08/2020. Repeat every year  Bone Density status: Completed 10/19/2017.   Lung Cancer Screening: (Low Dose CT Chest recommended if Age 68-80 years, 30 pack-year currently smoking OR have quit w/in 15years.) does not qualify.   Lung Cancer Screening Referral: no  Additional Screening:  Hepatitis C Screening: does qualify; Completed 04/13/2012  Vision Screening: Recommended annual ophthalmology exams for early detection of glaucoma and other disorders of the eye. Is the patient up to date with their annual eye exam?  Yes  Who is the provider or what is the name of the office in which the patient attends annual eye exams? [facility] If pt is not established with a provider, would they like to be referred to a provider to establish care? No .   Dental Screening: Recommended annual dental exams for proper oral hygiene  Community Resource Referral / Chronic Care Management: CRR required this visit?  No   CCM required this visit?  No      Plan:     I have personally reviewed and noted the following in the patient's chart:   Medical and social history Use of alcohol, tobacco or illicit drugs  Current medications and supplements including opioid prescriptions.  Functional ability and status Nutritional status Physical activity Advanced directives List of other physicians Hospitalizations, surgeries, and ER visits in previous 12 months Vitals Screenings to include cognitive, depression, and falls Referrals and appointments  In addition, I have reviewed and discussed with patient  certain preventive protocols, quality metrics, and best practice recommendations. A written personalized care plan for preventive services as well as general preventive health recommendations were provided to patient.     Kellie Simmering, LPN   [phone]   Nurse Notes:

## 2020-08-06 NOTE — Patient Instructions (Signed)
Marissa Orozco , Thank you for taking time to come for your Medicare Wellness Visit. I appreciate your ongoing commitment to your health goals. Please review the following plan we discussed and let me know if I can assist you in the future.   Screening recommendations/referrals: Colonoscopy: completed 05/27/2017, due 05/28/2022 Mammogram: completed 01/08/2020 Bone Density: completed 10/19/2017 Recommended yearly ophthalmology/optometry visit for glaucoma screening and checkup Recommended yearly dental visit for hygiene and checkup  Vaccinations: Influenza vaccine: due Pneumococcal vaccine: decline Tdap vaccine: completed 06/01/2012, due 06/02/2022 Shingles vaccine: decline   Covid-19: 11/02/2019, 03/02/2019, 01/26/2019  Advanced directives: Please bring a copy of your POA (Power of Attorney) and/or Living Will to your next appointment.   Conditions/risks identified: none  Next appointment: Follow up in one year for your annual wellness visit    Preventive Care 65 Years and Older, Female Preventive care refers to lifestyle choices and visits with your health care provider that can promote health and wellness. What does preventive care include? A yearly physical exam. This is also called an annual well check. Dental exams once or twice a year. Routine eye exams. Ask your health care provider how often you should have your eyes checked. Personal lifestyle choices, including: Daily care of your teeth and gums. Regular physical activity. Eating a healthy diet. Avoiding tobacco and drug use. Limiting alcohol use. Practicing safe sex. Taking low-dose aspirin every day. Taking vitamin and mineral supplements as recommended by your health care provider. What happens during an annual well check? The services and screenings done by your health care provider during your annual well check will depend on your age, overall health, lifestyle risk factors, and family history of disease. Counseling  Your  health care provider may ask you questions about your: Alcohol use. Tobacco use. Drug use. Emotional well-being. Home and relationship well-being. Sexual activity. Eating habits. History of falls. Memory and ability to understand (cognition). Work and work Statistician. Reproductive health. Screening  You may have the following tests or measurements: Height, weight, and BMI. Blood pressure. Lipid and cholesterol levels. These may be checked every 5 years, or more frequently if you are over 25 years old. Skin check. Lung cancer screening. You may have this screening every year starting at age 66 if you have a 30-pack-year history of smoking and currently smoke or have quit within the past 15 years. Fecal occult blood test (FOBT) of the stool. You may have this test every year starting at age 57. Flexible sigmoidoscopy or colonoscopy. You may have a sigmoidoscopy every 5 years or a colonoscopy every 10 years starting at age 25. Hepatitis C blood test. Hepatitis B blood test. Sexually transmitted disease (STD) testing. Diabetes screening. This is done by checking your blood sugar (glucose) after you have not eaten for a while (fasting). You may have this done every 1-3 years. Bone density scan. This is done to screen for osteoporosis. You may have this done starting at age 78. Mammogram. This may be done every 1-2 years. Talk to your health care provider about how often you should have regular mammograms. Talk with your health care provider about your test results, treatment options, and if necessary, the need for more tests. Vaccines  Your health care provider may recommend certain vaccines, such as: Influenza vaccine. This is recommended every year. Tetanus, diphtheria, and acellular pertussis (Tdap, Td) vaccine. You may need a Td booster every 10 years. Zoster vaccine. You may need this after age 53. Pneumococcal 13-valent conjugate (PCV13) vaccine. One dose  is recommended after age  65. Pneumococcal polysaccharide (PPSV23) vaccine. One dose is recommended after age 7. Talk to your health care provider about which screenings and vaccines you need and how often you need them. This information is not intended to replace advice given to you by your health care provider. Make sure you discuss any questions you have with your health care provider. Document Released: 01/17/2015 Document Revised: 09/10/2015 Document Reviewed: 10/22/2014 Elsevier Interactive Patient Education  2017 [place] Prevention in the Home Falls can cause injuries. They can happen to people of all ages. There are many things you can do to make your home safe and to help prevent falls. What can I do on the outside of my home? Regularly fix the edges of walkways and driveways and fix any cracks. Remove anything that might make you trip as you walk through a door, such as a raised step or threshold. Trim any bushes or trees on the path to your home. Use bright outdoor lighting. Clear any walking paths of anything that might make someone trip, such as rocks or tools. Regularly check to see if handrails are loose or broken. Make sure that both sides of any steps have handrails. Any raised decks and porches should have guardrails on the edges. Have any leaves, snow, or ice cleared regularly. Use sand or salt on walking paths during winter. Clean up any spills in your garage right away. This includes oil or grease spills. What can I do in the bathroom? Use night lights. Install grab bars by the toilet and in the tub and shower. Do not use towel bars as grab bars. Use non-skid mats or decals in the tub or shower. If you need to sit down in the shower, use a plastic, non-slip stool. Keep the floor dry. Clean up any water that spills on the floor as soon as it happens. Remove soap buildup in the tub or shower regularly. Attach bath mats securely with double-sided non-slip rug tape. Do not have throw  rugs and other things on the floor that can make you trip. What can I do in the bedroom? Use night lights. Make sure that you have a light by your bed that is easy to reach. Do not use any sheets or blankets that are too big for your bed. They should not hang down onto the floor. Have a firm chair that has side arms. You can use this for support while you get dressed. Do not have throw rugs and other things on the floor that can make you trip. What can I do in the kitchen? Clean up any spills right away. Avoid walking on wet floors. Keep items that you use a lot in easy-to-reach places. If you need to reach something above you, use a strong step stool that has a grab bar. Keep electrical cords out of the way. Do not use floor polish or wax that makes floors slippery. If you must use wax, use non-skid floor wax. Do not have throw rugs and other things on the floor that can make you trip. What can I do with my stairs? Do not leave any items on the stairs. Make sure that there are handrails on both sides of the stairs and use them. Fix handrails that are broken or loose. Make sure that handrails are as long as the stairways. Check any carpeting to make sure that it is firmly attached to the stairs. Fix any carpet that is loose or worn. Avoid  having throw rugs at the top or bottom of the stairs. If you do have throw rugs, attach them to the floor with carpet tape. Make sure that you have a light switch at the top of the stairs and the bottom of the stairs. If you do not have them, ask someone to add them for you. What else can I do to help prevent falls? Wear shoes that: Do not have high heels. Have rubber bottoms. Are comfortable and fit you well. Are closed at the toe. Do not wear sandals. If you use a stepladder: Make sure that it is fully opened. Do not climb a closed stepladder. Make sure that both sides of the stepladder are locked into place. Ask someone to hold it for you, if  possible. Clearly mark and make sure that you can see: Any grab bars or handrails. First and last steps. Where the edge of each step is. Use tools that help you move around (mobility aids) if they are needed. These include: Canes. Walkers. Scooters. Crutches. Turn on the lights when you go into a dark area. Replace any light bulbs as soon as they burn out. Set up your furniture so you have a clear path. Avoid moving your furniture around. If any of your floors are uneven, fix them. If there are any pets around you, be aware of where they are. Review your medicines with your doctor. Some medicines can make you feel dizzy. This can increase your chance of falling. Ask your doctor what other things that you can do to help prevent falls. This information is not intended to replace advice given to you by your health care provider. Make sure you discuss any questions you have with your health care provider. Document Released: 10/17/2008 Document Revised: 05/29/2015 Document Reviewed: 01/25/2014 Elsevier Interactive Patient Education  2017 [employer].

## 2020-09-02 ENCOUNTER — Encounter: Payer: Self-pay | Admitting: Internal Medicine

## 2020-10-06 ENCOUNTER — Other Ambulatory Visit: Payer: Self-pay | Admitting: Internal Medicine

## 2020-10-22 DIAGNOSIS — Z23 Encounter for immunization: Secondary | ICD-10-CM | POA: Diagnosis not present

## 2020-11-04 ENCOUNTER — Ambulatory Visit (INDEPENDENT_AMBULATORY_CARE_PROVIDER_SITE_OTHER): Payer: Medicare Other | Admitting: Internal Medicine

## 2020-11-04 ENCOUNTER — Encounter: Payer: Self-pay | Admitting: Internal Medicine

## 2020-11-04 ENCOUNTER — Other Ambulatory Visit: Payer: Self-pay

## 2020-11-04 VITALS — BP 124/72 | HR 63 | Temp 98.1°F | Ht 63.0 in | Wt 164.6 lb

## 2020-11-04 DIAGNOSIS — Z23 Encounter for immunization: Secondary | ICD-10-CM | POA: Diagnosis not present

## 2020-11-04 DIAGNOSIS — E2839 Other primary ovarian failure: Secondary | ICD-10-CM

## 2020-11-04 DIAGNOSIS — I10 Essential (primary) hypertension: Secondary | ICD-10-CM

## 2020-11-04 DIAGNOSIS — E7800 Pure hypercholesterolemia, unspecified: Secondary | ICD-10-CM | POA: Diagnosis not present

## 2020-11-04 LAB — LIPID PANEL
Chol/HDL Ratio: 3.7 ratio (ref 0.0–4.4)
Cholesterol, Total: 209 mg/dL — ABNORMAL HIGH (ref 100–199)
HDL: 57 mg/dL (ref >39)
LDL Chol Calc (NIH): 125 mg/dL — ABNORMAL HIGH (ref 0–99)
Triglycerides: 152 mg/dL — ABNORMAL HIGH (ref 0–149)
VLDL Cholesterol Cal: 27 mg/dL (ref 5–40)

## 2020-11-04 LAB — CMP14+EGFR
ALT: 15 IU/L (ref 0–32)
AST: 18 IU/L (ref 0–40)
Albumin/Globulin Ratio: 2 (ref 1.2–2.2)
Albumin: 4.5 g/dL (ref 3.7–4.7)
Alkaline Phosphatase: 82 IU/L (ref 44–121)
BUN/Creatinine Ratio: 15 (ref 12–28)
BUN: 12 mg/dL (ref 8–27)
Bilirubin Total: 0.8 mg/dL (ref 0.0–1.2)
CO2: 21 mmol/L (ref 20–29)
Calcium: 10.1 mg/dL (ref 8.7–10.3)
Chloride: 108 mmol/L — ABNORMAL HIGH (ref 96–106)
Creatinine, Ser: 0.82 mg/dL (ref 0.57–1.00)
Globulin, Total: 2.3 g/dL (ref 1.5–4.5)
Glucose: 103 mg/dL — ABNORMAL HIGH (ref 70–99)
Potassium: 4.7 mmol/L (ref 3.5–5.2)
Sodium: 142 mmol/L (ref 134–144)
Total Protein: 6.8 g/dL (ref 6.0–8.5)
eGFR: 74 mL/min/{1.73_m2} (ref >59)

## 2020-11-04 MED ORDER — LISINOPRIL 20 MG PO TABS: 20.0000 mg | ORAL_TABLET | Freq: Every day | ORAL | 2 refills | Status: DC

## 2020-11-04 MED ORDER — SHINGRIX 50 MCG/0.5ML IM SUSR: 0.5000 mL | Freq: Once | INTRAMUSCULAR | 0 refills | Status: AC

## 2020-11-04 MED ORDER — PREVNAR 20 0.5 ML IM SUSY: 0.5000 mL | PREFILLED_SYRINGE | INTRAMUSCULAR | 0 refills | Status: AC

## 2020-11-04 MED ORDER — PRAVASTATIN SODIUM 80 MG PO TABS: 80.0000 mg | ORAL_TABLET | Freq: Every day | ORAL | 2 refills | Status: DC

## 2020-11-04 NOTE — Patient Instructions (Signed)
Exercising to Stay Healthy °To become healthy and stay healthy, it is recommended that you do moderate-intensity and vigorous-intensity exercise. You can tell that you are exercising at a moderate intensity if your heart starts beating faster and you start breathing faster but can still hold a conversation. You can tell that you are exercising at a vigorous intensity if you are breathing much harder and faster and cannot hold a conversation while exercising. °How can exercise benefit me? °Exercising regularly is important. It has many health benefits, such as: °Improving overall fitness, flexibility, and endurance. °Increasing bone density. °Helping with weight control. °Decreasing body fat. °Increasing muscle strength and endurance. °Reducing stress and tension, anxiety, depression, or anger. °Improving overall health. °What guidelines should I follow while exercising? °Before you start a new exercise program, talk with your health care provider. °Do not exercise so much that you hurt yourself, feel dizzy, or get very short of breath. °Wear comfortable clothes and wear shoes with good support. °Drink plenty of water while you exercise to prevent dehydration or heat stroke. °Work out until your breathing and your heartbeat get faster (moderate intensity). °How often should I exercise? °Choose an activity that you enjoy, and set realistic goals. Your health care provider can help you make an activity plan that is individually designed and works best for you. °Exercise regularly as told by your health care provider. This may include: °Doing strength training two times a week, such as: °Lifting weights. °Using resistance bands. °Push-ups. °Sit-ups. °Yoga. °Doing a certain intensity of exercise for a given amount of time. Choose from these options: °A total of 150 minutes of moderate-intensity exercise every week. °A total of 75 minutes of vigorous-intensity exercise every week. °A mix of moderate-intensity and  vigorous-intensity exercise every week. °Children, pregnant women, people who have not exercised regularly, people who are overweight, and older adults may need to talk with a health care provider about what activities are safe to perform. If you have a medical condition, be sure to talk with your health care provider before you start a new exercise program. °What are some exercise ideas? °Moderate-intensity exercise ideas include: °Walking 1 mile (1.6 km) in about 15 minutes. °Biking. °Hiking. °Golfing. °Dancing. °Water aerobics. °Vigorous-intensity exercise ideas include: °Walking 4.5 miles (7.2 km) or more in about 1 hour. °Jogging or running 5 miles (8 km) in about 1 hour. °Biking 10 miles (16.1 km) or more in about 1 hour. °Lap swimming. °Roller-skating or in-line skating. °Cross-country skiing. °Vigorous competitive sports, such as football, basketball, and soccer. °Jumping rope. °Aerobic dancing. °What are some everyday activities that can help me get exercise? °Yard work, such as: °Pushing a lawn mower. °Raking and bagging leaves. °Washing your car. °Pushing a stroller. °Shoveling snow. °Gardening. °Washing windows or floors. °How can I be more active in my day-to-day activities? °Use stairs instead of an elevator. °Take a walk during your lunch break. °If you drive, park your car farther away from your work or school. °If you take public transportation, get off one stop early and walk the rest of the way. °Stand up or walk around during all of your indoor phone calls. °Get up, stretch, and walk around every 30 minutes throughout the day. °Enjoy exercise with a friend. Support to continue exercising will help you keep a regular routine of activity. °Where to find more information °You can find more information about exercising to stay healthy from: °[place] Department of Health and Human Services: www.hhs.gov °Centers for Disease Control and Prevention (  CDC): www.cdc.gov °Summary °Exercising regularly is  important. It will improve your overall fitness, flexibility, and endurance. °Regular exercise will also improve your overall health. It can help you control your weight, reduce stress, and improve your bone density. °Do not exercise so much that you hurt yourself, feel dizzy, or get very short of breath. °Before you start a new exercise program, talk with your health care provider. °This information is not intended to replace advice given to you by your health care provider. Make sure you discuss any questions you have with your health care provider. °Document Revised: 04/18/2020 Document Reviewed: 04/18/2020 °Elsevier Patient Education © 2022 Elsevier Inc. ° °

## 2020-11-04 NOTE — Progress Notes (Signed)
I,Katawbba Wiggins,acting as a Education administrator for Maximino Greenland, MD.,have documented all relevant documentation on the behalf of Maximino Greenland, MD,as directed by  Maximino Greenland, MD while in the presence of Maximino Greenland, MD.  This visit occurred during the SARS-CoV-2 public health emergency.  Safety protocols were in place, including screening questions prior to the visit, additional usage of staff PPE, and extensive cleaning of exam room while observing appropriate contact time as indicated for disinfecting solutions.  Subjective:     Patient ID: Marissa Orozco , female    DOB: Dec 20, 1945 , 75 y.o.   MRN: [id number]   Chief Complaint  Patient presents with   Hypertension    HPI  The patient is here for a follow-up on her blood pressure today. She reports compliance with meds. Denies headaches, chest pain and shortness of breath. She has brought in her BP log - no issues with meds.   Hypertension This is a chronic problem. The current episode started more than 1 year ago. The problem is controlled. Pertinent negatives include no blurred vision, chest pain, palpitations or shortness of breath. Risk factors for coronary artery disease include dyslipidemia and post-menopausal state. Past treatments include ACE inhibitors. The current treatment provides moderate improvement.    Past Medical History:  Diagnosis Date   Breast lump    Colon polyps    Fibroids    GERD (gastroesophageal reflux disease)    Glaucoma    Hyperlipidemia 1986   Hypertension 1986   Interstitial cystitis 1999   Kidney stones 2005   Osteoporosis    Tonsillitis      Family History  Problem Relation Age of Onset   Coronary artery disease Father        mother, PGM   Stroke Father    Hypertension Father    Colon cancer Father 53   Hypertension Mother    Heart disease Mother    Colon cancer Mother 26   Diabetes Other    Colon cancer Maternal Grandmother    Breast cancer Neg Hx      Current Outpatient  Medications:    amLODipine (NORVASC) 5 MG tablet, TAKE 1 TABLET EVERY DAY, Disp: 90 tablet, Rfl: 2   CALCIUM PO, Take 1 tablet by mouth daily. 500 mg daily, Disp: , Rfl:    Cholecalciferol (VITAMIN D PO), Take 1 tablet by mouth daily. 2000 units daily, Disp: , Rfl:    dorzolamide-timolol (COSOPT) 22.3-6.8 MG/ML ophthalmic solution, , Disp: , Rfl:    finasteride (PROSCAR) 5 MG tablet, Take 5 mg by mouth daily., Disp: , Rfl:    latanoprost (XALATAN) 0.005 % ophthalmic solution, Place 1 drop into both eyes daily., Disp: , Rfl:    pneumococcal 20-valent conjugate vaccine (PREVNAR 20) 0.5 ML injection, Inject 0.5 mLs into the muscle tomorrow at 10 am for 1 dose., Disp: 0.5 mL, Rfl: 0   Zoster Vaccine Adjuvanted (SHINGRIX) injection, Inject 0.5 mLs into the muscle once for 1 dose., Disp: 0.5 mL, Rfl: 0   lisinopril (ZESTRIL) 20 MG tablet, Take 1 tablet (20 mg total) by mouth daily., Disp: 90 tablet, Rfl: 2   pravastatin (PRAVACHOL) 80 MG tablet, Take 1 tablet (80 mg total) by mouth daily., Disp: 90 tablet, Rfl: 2   Allergies  Allergen Reactions   Hctz [Hydrochlorothiazide] Itching     Review of Systems  Constitutional: Negative.   Eyes:  Negative for blurred vision.  Respiratory: Negative.  Negative for shortness of breath.   Cardiovascular:  Negative.  Negative for chest pain and palpitations.  Gastrointestinal: Negative.   Psychiatric/Behavioral: Negative.    All other systems reviewed and are negative.   Today's Vitals   11/04/20 0920  BP: 124/72  Pulse: 63  Temp: 98.1 F (36.7 C)  Weight: 164 lb 9.6 oz (74.7 kg)  Height: '5\' 3"'  (1.6 m)  PainSc: 0-No pain   Body mass index is 29.16 kg/m.  Wt Readings from Last 3 Encounters:  11/04/20 164 lb 9.6 oz (74.7 kg)  08/06/20 163 lb 12.8 oz (74.3 kg)  06/11/20 160 lb 6.4 oz (72.8 kg)    BP Readings from Last 3 Encounters:  11/04/20 124/72  08/06/20 130/70  06/11/20 130/82    Objective:  Physical Exam Vitals and nursing note  reviewed.  Constitutional:      Appearance: Normal appearance.  HENT:     Head: Normocephalic and atraumatic.     Nose:     Comments: Masked     Mouth/Throat:     Comments: Masked  Eyes:     Extraocular Movements: Extraocular movements intact.  Cardiovascular:     Rate and Rhythm: Normal rate and regular rhythm.     Heart sounds: Normal heart sounds.  Pulmonary:     Effort: Pulmonary effort is normal.     Breath sounds: Normal breath sounds.  Musculoskeletal:     Cervical back: Normal range of motion.  Skin:    General: Skin is warm.  Neurological:     General: No focal deficit present.     Mental Status: She is alert.  Psychiatric:        Mood and Affect: Mood normal.        Behavior: Behavior normal.        Assessment And Plan:     1. HTN (hypertension), malignant Comments: Chronic, well controlled. BP log reviewed, majority of readings are <130/80. We discussed limiting intake of breads/cheeses which can be high in sodium. F/u 6 months.  - CMP14+EGFR  2. Pure hypercholesterolemia Comments: Chronic, I will check lipid panel and LFTs today. She will f/u in six months. Encouraged to follow heart healthy diet.  - Lipid panel  3. Estrogen deficiency Comments: [facility]. I will request to schedule same day as upcoming mammogram.   4. Immunization due Comments: I will send rx [id number] and Shingrix to her local pharmacy.     Patient was given opportunity to ask questions. Patient verbalized understanding of the plan and was able to repeat key elements of the plan. All questions were answered to their satisfaction.   I, Maximino Greenland, MD, have reviewed all documentation for this visit. The documentation on 11/04/20 for the exam, diagnosis, procedures, and orders are all accurate and complete.   IF YOU HAVE BEEN REFERRED TO A SPECIALIST, IT MAY TAKE 1-2 WEEKS TO SCHEDULE/PROCESS THE REFERRAL. IF YOU HAVE NOT HEARD FROM  US/SPECIALIST IN TWO WEEKS, PLEASE GIVE [place] A CALL AT [phone] X [phone].   THE PATIENT IS ENCOURAGED TO PRACTICE SOCIAL DISTANCING DUE TO THE COVID-19 PANDEMIC.

## 2020-11-04 NOTE — Addendum Note (Signed)
Addended by: Maximino Greenland on: 11/04/2020 08:59 PM   Modules accepted: Orders

## 2020-11-12 ENCOUNTER — Other Ambulatory Visit: Payer: Self-pay | Admitting: Internal Medicine

## 2020-11-12 DIAGNOSIS — Z1231 Encounter for screening mammogram for malignant neoplasm of breast: Secondary | ICD-10-CM

## 2020-11-12 DIAGNOSIS — E2839 Other primary ovarian failure: Secondary | ICD-10-CM

## 2020-12-04 DIAGNOSIS — H401131 Primary open-angle glaucoma, bilateral, mild stage: Secondary | ICD-10-CM | POA: Diagnosis not present

## 2020-12-04 DIAGNOSIS — Z961 Presence of intraocular lens: Secondary | ICD-10-CM | POA: Diagnosis not present

## 2021-01-02 DIAGNOSIS — Z23 Encounter for immunization: Secondary | ICD-10-CM | POA: Diagnosis not present

## 2021-02-14 ENCOUNTER — Emergency Department (HOSPITAL_COMMUNITY): Payer: No Typology Code available for payment source

## 2021-02-14 ENCOUNTER — Other Ambulatory Visit: Payer: Self-pay

## 2021-02-14 ENCOUNTER — Encounter (HOSPITAL_COMMUNITY): Payer: Self-pay | Admitting: Emergency Medicine

## 2021-02-14 ENCOUNTER — Encounter: Payer: Self-pay | Admitting: Internal Medicine

## 2021-02-14 ENCOUNTER — Emergency Department (HOSPITAL_COMMUNITY)
Admission: EM | Admit: 2021-02-14 | Discharge: 2021-02-14 | Disposition: A | Discharge status: Home / Self Care | Payer: No Typology Code available for payment source | Attending: Emergency Medicine | Admitting: Emergency Medicine

## 2021-02-14 DIAGNOSIS — Z79899 Other long term (current) drug therapy: Secondary | ICD-10-CM | POA: Diagnosis not present

## 2021-02-14 DIAGNOSIS — R519 Headache, unspecified: Secondary | ICD-10-CM | POA: Diagnosis not present

## 2021-02-14 DIAGNOSIS — Y92410 Unspecified street and highway as the place of occurrence of the external cause: Secondary | ICD-10-CM | POA: Diagnosis not present

## 2021-02-14 DIAGNOSIS — I6381 Other cerebral infarction due to occlusion or stenosis of small artery: Secondary | ICD-10-CM | POA: Diagnosis not present

## 2021-02-14 DIAGNOSIS — S0093XA Contusion of unspecified part of head, initial encounter: Secondary | ICD-10-CM | POA: Diagnosis not present

## 2021-02-14 DIAGNOSIS — S199XXA Unspecified injury of neck, initial encounter: Secondary | ICD-10-CM | POA: Diagnosis not present

## 2021-02-14 DIAGNOSIS — I10 Essential (primary) hypertension: Secondary | ICD-10-CM | POA: Insufficient documentation

## 2021-02-14 DIAGNOSIS — S0990XA Unspecified injury of head, initial encounter: Secondary | ICD-10-CM | POA: Diagnosis present
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
## 2021-02-16 ENCOUNTER — Telehealth: Payer: Self-pay

## 2021-02-16 NOTE — Telephone Encounter (Signed)
Transition Care Management Follow-up Telephone Call Date of discharge and from where: 02/14/2021 Marissa Orozco How have you been since you were /released from the hospital? fine Any questions or concerns? No  Items Reviewed: Did the pt receive and understand the discharge instructions provided? Yes  Medications obtained and verified? Yes  Other? No  Any new allergies since your discharge? No  Dietary orders reviewed? No Do you have support at home? Yes   Home Care and Equipment/Supplies: Were home health services ordered? no If so, what is the name of the agency? no  Has the agency set up a time to come to the patient's home? not applicable Were any new equipment or medical supplies ordered?  No What is the name of the medical supply agency? n\a Were you able to get the supplies/equipment? not applicable Do you have any questions related to the use of the equipment or supplies? No  Functional Questionnaire: (I = Independent and D = Dependent) ADLs: I  Bathing/Dressing- I  Meal Prep- I  Eating- I  Maintaining continence- I  Transferring/Ambulation- I  Managing Meds- I  Follow up appointments reviewed:  PCP Hospital f/u appt confirmed? Yes  Scheduled to see Dr. Baird Cancer on 02/16 @ 10:20. [facility] f/u appt confirmed? No   Are transportation arrangements needed? No  If their condition worsens, is the pt aware to call PCP or go to the Emergency Dept.? Yes Was the patient provided with contact information for the PCP's office or ED? Yes Was to pt encouraged to call back with questions or concerns? Yes

## 2021-02-19 ENCOUNTER — Encounter: Payer: Self-pay | Admitting: Internal Medicine

## 2021-02-19 ENCOUNTER — Ambulatory Visit (INDEPENDENT_AMBULATORY_CARE_PROVIDER_SITE_OTHER): Payer: Medicare Other | Admitting: Internal Medicine

## 2021-02-19 ENCOUNTER — Other Ambulatory Visit: Payer: Self-pay

## 2021-02-19 ENCOUNTER — Other Ambulatory Visit: Payer: Self-pay | Admitting: Internal Medicine

## 2021-02-19 VITALS — BP 110/68 | HR 63 | Temp 98.4°F | Ht 62.6 in | Wt 166.4 lb

## 2021-02-19 DIAGNOSIS — I6523 Occlusion and stenosis of bilateral carotid arteries: Secondary | ICD-10-CM

## 2021-02-19 DIAGNOSIS — I6381 Other cerebral infarction due to occlusion or stenosis of small artery: Secondary | ICD-10-CM | POA: Diagnosis not present

## 2021-02-19 DIAGNOSIS — E7800 Pure hypercholesterolemia, unspecified: Secondary | ICD-10-CM | POA: Diagnosis not present

## 2021-02-19 DIAGNOSIS — Z6829 Body mass index (BMI) 29.0-29.9, adult: Secondary | ICD-10-CM | POA: Diagnosis not present

## 2021-02-19 DIAGNOSIS — Z23 Encounter for immunization: Secondary | ICD-10-CM

## 2021-02-19 MED ORDER — PRAVASTATIN SODIUM 80 MG PO TABS: 80.0000 mg | ORAL_TABLET | Freq: Every day | ORAL | 2 refills | Status: DC

## 2021-02-19 MED ORDER — ATORVASTATIN CALCIUM 40 MG PO TABS: 40.0000 mg | ORAL_TABLET | Freq: Every day | ORAL | 2 refills | Status: DC

## 2021-02-19 MED ORDER — LISINOPRIL 20 MG PO TABS: 20.0000 mg | ORAL_TABLET | Freq: Every day | ORAL | 2 refills | Status: DC

## 2021-02-19 MED ORDER — AMLODIPINE BESYLATE 5 MG PO TABS: 5.0000 mg | ORAL_TABLET | Freq: Every day | ORAL | 2 refills | Status: DC

## 2021-02-19 NOTE — Patient Instructions (Signed)
Carotid Artery Disease Carotid artery disease, also called carotid artery stenosis, is the narrowing or blockage of one or both carotid arteries. The carotid arteries are the two main blood vessels on either side of the neck. They supply blood to the brain, other parts of the head, and the neck. Carotid artery disease increases your risk for a stroke or a transient ischemic attack (TIA). A TIA is a "mini-stroke" that causes stroke-like symptoms that then go away quickly. What are the causes? This condition is mainly caused by a narrowing and hardening of the carotid arteries (atherosclerosis). The carotid arteries can become narrow or clogged with a buildup of fat, cholesterol, calcium, and other substances (plaque). What increases the risk? The following factors may make you more likely to develop this condition: Having certain medical conditions, such as: High cholesterol. High blood pressure (hypertension). Diabetes. Obesity. Smoking. A family history of cardiovascular disease. Inactivity or lack of regular exercise. Being female. Men have an increased risk of developing atherosclerosis earlier in life than women. Old age. What are the signs or symptoms? This condition may not have any signs or symptoms until a stroke or TIA occurs. In some cases, your health care provider may be able to hear a whooshing sound (bruit). This can indicate a change in blood flow caused by plaque buildup. An eye exam can also help identify signs of the condition. How is this diagnosed? This condition may be diagnosed with a physical exam, your medical history, and your family's medical history. You may also have tests that look at the blood flow in your carotid arteries, such as: Carotid artery ultrasound, which uses sound waves to create pictures to show if the arteries are narrow or blocked. Tests that use a dye injected into a vein to highlight your arteries on images, such as: Carotid or cerebral angiography,  which uses X-rays. Computerized tomographic angiography (CTA), which uses CT scans. Magnetic resonance angiography (MRA), which uses MRI. How is this treated? This condition may be treated with a combination of treatments. Treatment options include: Lifestyle changes, such as: Quitting smoking. Exercising regularly or as told by your health care provider. Eating a heart-healthy diet. Managing stress. Maintaining a healthy weight. Medicines to control blood pressure, cholesterol, and blood clotting. Surgery. You may have: A carotid endarterectomy. This is a surgery to remove the blockages in the carotid arteries. A carotid angioplasty with stenting. This is a procedure in which a small mesh tube (stent) is used to widen the blocked carotid arteries. Follow these instructions at home: Eating and drinking Follow instructions about your diet from your health care provider. It is important to: Eat a healthy diet that is low in saturated fats and includes plenty of fresh fruits, vegetables, and lean meats. Avoid foods that are high in fat and salt (sodium). Avoid foods that are fried, overly processed, or have poor nutritional value.  Lifestyle  Maintain a healthy weight. Do exercises as told by your health care provider to stay physically active. It is recommended that each week you get at least 150 minutes of moderate-intensity exercise or 75 minutes of exercise that takes a lot of effort. Do not use any products that contain nicotine or tobacco, such as cigarettes, e-cigarettes, and chewing tobacco. If you need help quitting, ask your health care provider. Do not drink alcohol if: Your health care provider tells you not to drink. You are pregnant, may be pregnant, or are planning to become pregnant. If you drink alcohol: Limit how much  you use to: 0-1 drink a day for women. 0-2 drinks a day for men. Be aware of how much alcohol is in your drink. In the [place] one drink equals one 12 oz  bottle of beer (355 mL), one 5 oz glass of wine (148 mL), or one 1 oz glass of hard liquor (44 mL). Do not use drugs. Manage your stress. Ask your health care provider for stress management tips. General instructions Take over-the-counter and prescription medicines only as told by your health care provider. Keep all follow-up visits as told by your health care provider. This is important. Where to find more information American Heart Association: www.heart.org Get help right away if: You have any symptoms of a stroke. "BE FAST" is an easy way to remember the main warning signs of a stroke: B - Balance. Signs are dizziness, sudden trouble walking, or loss of balance. E - Eyes. Signs are trouble seeing or a sudden change in vision. F - Face. Signs are sudden weakness or numbness of the face, or the face or eyelid drooping on one side. A - Arms. Signs are weakness or numbness in an arm. This happens suddenly and usually on one side of the body. S - Speech. Signs are sudden trouble speaking, slurred speech, or trouble understanding what people say. T - Time. Time to call emergency services. Write down what time symptoms started. You have other signs of a stroke, such as: A sudden, severe headache with no known cause. Nausea or vomiting. Seizure. These symptoms may represent a serious problem that is an emergency. Do not wait to see if the symptoms will go away. Get medical help right away. Call your local emergency services (911 in the [place] Do not drive yourself to the hospital. Summary Carotid artery disease, also called carotid artery stenosis, is the narrowing or blockage of one or both carotid arteries. Carotid artery disease increases your risk for a stroke or a transient ischemic attack (TIA). This condition can be treated with lifestyle changes, medicines, surgery, or a combination of these treatments. Get help right away if you have any symptoms of stroke. The acronym BEFAST is an  easy way to remember the main warning signs of stroke. This information is not intended to replace advice given to you by your health care provider. Make sure you discuss any questions you have with your health care provider. Document Revised: 07/03/2018 Document Reviewed: 07/03/2018 Elsevier Patient Education  [place].
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
## 2021-02-21 DIAGNOSIS — Z6829 Body mass index (BMI) 29.0-29.9, adult: Secondary | ICD-10-CM | POA: Insufficient documentation

## 2021-02-21 DIAGNOSIS — I6523 Occlusion and stenosis of bilateral carotid arteries: Secondary | ICD-10-CM | POA: Insufficient documentation

## 2021-02-21 DIAGNOSIS — I6381 Other cerebral infarction due to occlusion or stenosis of small artery: Secondary | ICD-10-CM | POA: Insufficient documentation

## 2021-02-24 ENCOUNTER — Encounter: Payer: Self-pay | Admitting: Internal Medicine

## 2021-02-24 ENCOUNTER — Other Ambulatory Visit: Payer: Self-pay

## 2021-02-24 ENCOUNTER — Ambulatory Visit (HOSPITAL_COMMUNITY)
Admission: RE | Admit: 2021-02-24 | Discharge: 2021-02-24 | Disposition: A | Discharge status: Home / Self Care | Payer: Medicare Other | Source: Ambulatory Visit | Attending: Internal Medicine | Admitting: Internal Medicine

## 2021-02-24 DIAGNOSIS — I6523 Occlusion and stenosis of bilateral carotid arteries: Secondary | ICD-10-CM | POA: Insufficient documentation

## 2021-03-05 ENCOUNTER — Other Ambulatory Visit: Payer: Self-pay

## 2021-03-05 MED ORDER — ATORVASTATIN CALCIUM 40 MG PO TABS: 40.0000 mg | ORAL_TABLET | Freq: Every day | ORAL | 0 refills | Status: DC

## 2021-03-06 ENCOUNTER — Encounter: Payer: Self-pay | Admitting: Internal Medicine

## 2021-04-01 ENCOUNTER — Encounter: Payer: Self-pay | Admitting: Internal Medicine

## 2021-04-02 ENCOUNTER — Ambulatory Visit (INDEPENDENT_AMBULATORY_CARE_PROVIDER_SITE_OTHER): Payer: Medicare Other | Admitting: Internal Medicine

## 2021-04-02 ENCOUNTER — Encounter: Payer: Self-pay | Admitting: Internal Medicine

## 2021-04-02 VITALS — BP 118/70 | HR 70 | Temp 98.6°F | Ht 62.6 in | Wt 166.4 lb

## 2021-04-02 DIAGNOSIS — Z6829 Body mass index (BMI) 29.0-29.9, adult: Secondary | ICD-10-CM | POA: Diagnosis not present

## 2021-04-02 DIAGNOSIS — I6523 Occlusion and stenosis of bilateral carotid arteries: Secondary | ICD-10-CM

## 2021-04-02 DIAGNOSIS — R300 Dysuria: Secondary | ICD-10-CM | POA: Diagnosis not present

## 2021-04-02 DIAGNOSIS — I10 Essential (primary) hypertension: Secondary | ICD-10-CM | POA: Diagnosis not present

## 2021-04-02 DIAGNOSIS — N3091 Cystitis, unspecified with hematuria: Secondary | ICD-10-CM

## 2021-04-02 LAB — POCT URINALYSIS DIPSTICK
Bilirubin, UA: NEGATIVE
Glucose, UA: NEGATIVE
Ketones, UA: NEGATIVE
Nitrite, UA: POSITIVE
Protein, UA: POSITIVE — AB
Spec Grav, UA: 1.025 (ref 1.010–1.025)
Urobilinogen, UA: 0.2 E.U./dL
pH, UA: 5.5 (ref 5.0–8.0)

## 2021-04-02 MED ORDER — NITROFURANTOIN MONOHYD MACRO 100 MG PO CAPS: 100.0000 mg | ORAL_CAPSULE | Freq: Two times a day (BID) | ORAL | 0 refills | Status: AC

## 2021-04-02 NOTE — Progress Notes (Signed)
?Rich Brave Llittleton,acting as a Education administrator for Maximino Greenland, MD.,have documented all relevant documentation on the behalf of Maximino Greenland, MD,as directed by  Maximino Greenland, MD while in the presence of Maximino Greenland, MD.  ?This visit occurred during the SARS-CoV-2 public health emergency.  Safety protocols were in place, including screening questions prior to the visit, additional usage of staff PPE, and extensive cleaning of exam room while observing appropriate contact time as indicated for disinfecting solutions. ? ?Subjective:  ?  ? Patient ID: Marissa Orozco , female    DOB: 31-Aug-1945 , 76 y.o.   MRN: [id number] ? ? ?Chief Complaint  ?Patient presents with  ? Dysuria  ? ? ?HPI ? ?Patient presents today for a possible UTI. She reports developing urinary frequency over hte past several days. However, yesterday, she had dysuria, worsening frequency and she had blood in her urine. She states she has passed some clots via her urine as well. She denies passing any blood vaginally. There is no associated abdominal pain.  ? ?Dysuria  ?This is a new problem. The current episode started yesterday. The problem occurs every urination. The problem has been gradually worsening. The quality of the pain is described as burning. There has been no fever. Associated symptoms include frequency, hematuria and urgency. Pertinent negatives include no discharge. She has tried nothing for the symptoms.   ? ?Past Medical History:  ?Diagnosis Date  ? Breast lump   ? Colon polyps   ? Fibroids   ? GERD (gastroesophageal reflux disease)   ? Glaucoma   ? Hyperlipidemia 1986  ? Hypertension 1986  ? Interstitial cystitis 1999  ? Kidney stones 2005  ? Osteoporosis   ? Tonsillitis   ?  ? ?Family History  ?Problem Relation Age of Onset  ? Coronary artery disease Father   ?     mother, PGM  ? Stroke Father   ? Hypertension Father   ? Colon cancer Father 2  ? Hypertension Mother   ? Heart disease Mother   ? Colon cancer Mother 66  ?  Diabetes Other   ? Colon cancer Maternal Grandmother   ? Breast cancer Neg Hx   ? ? ? ?Current Outpatient Medications:  ?  amLODipine (NORVASC) 5 MG tablet, Take 1 tablet (5 mg total) by mouth daily., Disp: 90 tablet, Rfl: 2 ?  atorvastatin (LIPITOR) 40 MG tablet, Take 1 tablet (40 mg total) by mouth daily., Disp: 10 tablet, Rfl: 0 ?  CALCIUM PO, Take 1 tablet by mouth daily. 500 mg daily, Disp: , Rfl:  ?  Cholecalciferol (VITAMIN D PO), Take 1 tablet by mouth daily. 2000 units daily, Disp: , Rfl:  ?  dorzolamide-timolol (COSOPT) 22.3-6.8 MG/ML ophthalmic solution, , Disp: , Rfl:  ?  finasteride (PROSCAR) 5 MG tablet, Take 5 mg by mouth daily., Disp: , Rfl:  ?  latanoprost (XALATAN) 0.005 % ophthalmic solution, Place 1 drop into both eyes daily., Disp: , Rfl:  ?  lisinopril (ZESTRIL) 20 MG tablet, Take 1 tablet (20 mg total) by mouth daily., Disp: 90 tablet, Rfl: 2 ?  nitrofurantoin, macrocrystal-monohydrate, (MACROBID) 100 MG capsule, Take 1 capsule (100 mg total) by mouth 2 (two) times daily for 5 days., Disp: 10 capsule, Rfl: 0  ? ?Allergies  ?Allergen Reactions  ? Hctz [Hydrochlorothiazide] Itching  ?  ? ?Review of Systems  ?Constitutional: Negative.   ?Respiratory: Negative.    ?Cardiovascular: Negative.   ?Gastrointestinal: Negative.   ?Genitourinary:  Positive  for dysuria, frequency, hematuria and urgency.  ?Neurological: Negative.   ?Psychiatric/Behavioral: Negative.     ? ?Today's Vitals  ? 04/02/21 1108  ?BP: 118/70  ?Pulse: 70  ?Temp: 98.6 ?F (37 ?C)  ?Weight: 166 lb 6.4 oz (75.5 kg)  ?Height: 5' 2.6" (1.59 m)  ?PainSc: 2   ? ?Body mass index is 29.85 kg/m?.  ?Wt Readings from Last 3 Encounters:  ?04/02/21 166 lb 6.4 oz (75.5 kg)  ?02/19/21 166 lb 6.4 oz (75.5 kg)  ?11/04/20 164 lb 9.6 oz (74.7 kg)  ?  ? ?Objective:  ?Physical Exam ?Vitals and nursing note reviewed.  ?Constitutional:   ?   Appearance: Normal appearance.  ?HENT:  ?   Head: Normocephalic and atraumatic.  ?Cardiovascular:  ?   Rate and  Rhythm: Normal rate and regular rhythm.  ?   Heart sounds: Normal heart sounds.  ?Pulmonary:  ?   Effort: Pulmonary effort is normal.  ?   Breath sounds: Normal breath sounds.  ?Abdominal:  ?   Comments: No flank pain  ?Genitourinary: ?   Comments: There is no suprapubic tenderness.  ?Skin: ?   General: Skin is warm.  ?Neurological:  ?   General: No focal deficit present.  ?   Mental Status: She is alert.  ?Psychiatric:     ?   Mood and Affect: Mood normal.     ?   Behavior: Behavior normal.  ?  ? ?   ?Assessment And Plan:  ?   ?1. Hemorrhagic cystitis ?Comments: I will treat her with Macrobid twice daily. I will also send urine culture. She is encouraged to stay well hydrated.  ?- POCT Urinalysis Dipstick (81002) ?- Urine Culture ? ?2. HTN (hypertension), malignant ?Comments: Chronic, well controlled. No med changes.  ? ?3. BMI 29.0-29.9,adult ?Comments: She is encouraged to aim for at least 150 minutes of exercise per week.  ?  ? ? ?Patient was given opportunity to ask questions. Patient verbalized understanding of the plan and was able to repeat key elements of the plan. All questions were answered to their satisfaction.  ? ?I, Maximino Greenland, MD, have reviewed all documentation for this visit. The documentation on 04/05/21 for the exam, diagnosis, procedures, and orders are all accurate and complete.  ? ?IF YOU HAVE BEEN REFERRED TO A SPECIALIST, IT MAY TAKE 1-2 WEEKS TO SCHEDULE/PROCESS THE REFERRAL. IF YOU HAVE NOT HEARD FROM US/SPECIALIST IN TWO WEEKS, PLEASE GIVE [place] A CALL AT [phone].  ? ?THE PATIENT IS ENCOURAGED TO PRACTICE SOCIAL DISTANCING DUE TO THE COVID-19 PANDEMIC.   ?

## 2021-04-06 LAB — URINE CULTURE

## 2021-04-07 DIAGNOSIS — H401131 Primary open-angle glaucoma, bilateral, mild stage: Secondary | ICD-10-CM | POA: Diagnosis not present

## 2021-04-07 DIAGNOSIS — Z961 Presence of intraocular lens: Secondary | ICD-10-CM | POA: Diagnosis not present

## 2021-04-08 ENCOUNTER — Ambulatory Visit (INDEPENDENT_AMBULATORY_CARE_PROVIDER_SITE_OTHER): Payer: Medicare Other | Admitting: Neurology

## 2021-04-08 VITALS — BP 143/80 | HR 64 | Ht 63.5 in | Wt 166.0 lb

## 2021-04-08 DIAGNOSIS — I6381 Other cerebral infarction due to occlusion or stenosis of small artery: Secondary | ICD-10-CM

## 2021-04-08 DIAGNOSIS — G4733 Obstructive sleep apnea (adult) (pediatric): Secondary | ICD-10-CM | POA: Diagnosis not present

## 2021-04-08 DIAGNOSIS — E785 Hyperlipidemia, unspecified: Secondary | ICD-10-CM

## 2021-04-08 DIAGNOSIS — E663 Overweight: Secondary | ICD-10-CM
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
## 2021-04-08 NOTE — Patient Instructions (Addendum)
It was nice to see you again today.  As you know, your recent CT scan of the head incidentally showed evidence of a few smaller old strokes on the right side.  Your neurological exam is normal, thankfully.  Please continue to take your baby aspirin and cholesterol medication and pursue a healthy lifestyle, good hydration with water, 6 to 8 cups/day on average.  Avoid caffeine after 3 or latest by 5 PM.  Limit your alcohol to less than one 6 ounce serving of wine per day on average. ?As discussed, secondary prevention is key after a stroke. This means: taking care of blood sugar values or diabetes management (A1c goal of less than 7.0), good blood pressure (hypertension) control and optimizing cholesterol management (with LDL goal of less than 70), exercising daily or regularly within your own mobility limitations of course, and overall cardiovascular risk factor reduction, which includes screening for and treatment of obstructive sleep apnea (OSA) and weight management.  ? ?Please continue with your regular checkup with your primary care physician, I would recommend fasting blood test including cholesterol panel next time.  Your LDL in November was still a little elevated. ? ? ?I will order a brain MRI with and without contrast.  We will check your kidney function today. ?I will also order a repeat echocardiogram which is an ultrasound of your heart. ? ?If you change your mind about treating your mild sleep apnea, I would be happy to prescribe an AutoPap machine.  Since it has been almost 2 years since your home sleep test, we can also consider reevaluation with another home sleep test if you wish.  You can always call us if you want to pursue this.  For now, so long as your brain MRI and your echocardiogram showed benign findings or unchanged findings, we can have you follow-up in this clinic on an as-needed basis.  We will call you with the results of your tests or email you through [place]. ? ? ? ?

## 2021-04-09 ENCOUNTER — Telehealth: Payer: Self-pay | Admitting: Neurology

## 2021-04-09 NOTE — Telephone Encounter (Signed)
No auth req for the MRI or Echo, sent the MRI to GI and the Echo I sent Butch Penny a message. She will reach out to the patient to schedule.  ?

## 2021-04-10 LAB — COMPREHENSIVE METABOLIC PANEL
ALT: 19 IU/L (ref 0–32)
AST: 21 IU/L (ref 0–40)
Albumin/Globulin Ratio: 2 (ref 1.2–2.2)
Albumin: 4.2 g/dL (ref 3.7–4.7)
Alkaline Phosphatase: 96 IU/L (ref 44–121)
BUN/Creatinine Ratio: 23 (ref 12–28)
BUN: 18 mg/dL (ref 8–27)
Bilirubin Total: 1.1 mg/dL (ref 0.0–1.2)
CO2: 21 mmol/L (ref 20–29)
Calcium: 10 mg/dL (ref 8.7–10.3)
Chloride: 107 mmol/L — ABNORMAL HIGH (ref 96–106)
Creatinine, Ser: 0.79 mg/dL (ref 0.57–1.00)
Globulin, Total: 2.1 g/dL (ref 1.5–4.5)
Glucose: 137 mg/dL — ABNORMAL HIGH (ref 70–99)
Potassium: 4.7 mmol/L (ref 3.5–5.2)
Sodium: 142 mmol/L (ref 134–144)
Total Protein: 6.3 g/dL (ref 6.0–8.5)
eGFR: 77 mL/min/{1.73_m2} (ref >59)

## 2021-04-14 ENCOUNTER — Encounter: Payer: Self-pay | Admitting: Internal Medicine

## 2021-04-14 ENCOUNTER — Ambulatory Visit (INDEPENDENT_AMBULATORY_CARE_PROVIDER_SITE_OTHER): Payer: Medicare Other | Admitting: Internal Medicine

## 2021-04-14 VITALS — BP 128/76 | HR 57 | Temp 98.3°F | Ht 62.6 in | Wt 166.0 lb

## 2021-04-14 DIAGNOSIS — I6381 Other cerebral infarction due to occlusion or stenosis of small artery: Secondary | ICD-10-CM | POA: Diagnosis not present

## 2021-04-14 DIAGNOSIS — E7800 Pure hypercholesterolemia, unspecified: Secondary | ICD-10-CM

## 2021-04-14 MED ORDER — SCOPOLAMINE 1 MG/3DAYS TD PT72
1.0000 | MEDICATED_PATCH | TRANSDERMAL | 0 refills | Status: DC
Start: 2021-04-14 — End: 2021-04-20

## 2021-04-14 NOTE — Progress Notes (Signed)
?Rich Brave [place],acting as a Education administrator for Maximino Greenland, MD.,have documented all relevant documentation on the behalf of Maximino Greenland, MD,as directed by  Maximino Greenland, MD while in the presence of Maximino Greenland, MD.  ?This visit occurred during the SARS-CoV-2 public health emergency.  Safety protocols were in place, including screening questions prior to the visit, additional usage of staff PPE, and extensive cleaning of exam room while observing appropriate contact time as indicated for disinfecting solutions. ? ?Subjective:  ?  ? Patient ID: Marissa Orozco , female    DOB: 1945-12-19 , 76 y.o.   MRN: [id number] ? ? ?Chief Complaint  ?Patient presents with  ? Hyperlipidemia  ? ? ?HPI ? ?Patient presents today for a cholesterol check. The dose of atorvastatin was increased at last visit, due to lacunar infarcts being seen on CT scan. She has not had any issues with the medication.  ? ?Hyperlipidemia ?This is a chronic problem. The current episode started more than 1 year ago. She has no history of diabetes, hypothyroidism or obesity. Current antihyperlipidemic treatment includes statins, exercise and diet change.   ? ?Past Medical History:  ?Diagnosis Date  ? Breast lump   ? Colon polyps   ? Fibroids   ? GERD (gastroesophageal reflux disease)   ? Glaucoma   ? Hyperlipidemia 1986  ? Hypertension 1986  ? Interstitial cystitis 1999  ? Kidney stones 2005  ? Osteoporosis   ? Tonsillitis   ?  ? ?Family History  ?Problem Relation Age of Onset  ? Coronary artery disease Father   ?     mother, PGM  ? Stroke Father   ? Hypertension Father   ? Colon cancer Father 36  ? Hypertension Mother   ? Heart disease Mother   ? Colon cancer Mother 21  ? Diabetes Other   ? Colon cancer Maternal Grandmother   ? Breast cancer Neg Hx   ? ? ? ?Current Outpatient Medications:  ?  amLODipine (NORVASC) 5 MG tablet, Take 1 tablet (5 mg total) by mouth daily., Disp: 90 tablet, Rfl: 2 ?  atorvastatin (LIPITOR) 40 MG tablet, Take 1  tablet (40 mg total) by mouth daily., Disp: 10 tablet, Rfl: 0 ?  CALCIUM PO, Take 1 tablet by mouth daily. 500 mg daily, Disp: , Rfl:  ?  Cholecalciferol (VITAMIN D PO), Take 1 tablet by mouth daily. 2000 units daily, Disp: , Rfl:  ?  dorzolamide-timolol (COSOPT) 22.3-6.8 MG/ML ophthalmic solution, , Disp: , Rfl:  ?  finasteride (PROSCAR) 5 MG tablet, Take 5 mg by mouth daily., Disp: , Rfl:  ?  latanoprost (XALATAN) 0.005 % ophthalmic solution, Place 1 drop into both eyes daily., Disp: , Rfl:  ?  lisinopril (ZESTRIL) 20 MG tablet, Take 1 tablet (20 mg total) by mouth daily., Disp: 90 tablet, Rfl: 2 ?  scopolamine (TRANSDERM-SCOP) 1 MG/3DAYS, Place 1 patch (1.5 mg total) onto the skin every 3 (three) days., Disp: 10 patch, Rfl: 0  ? ?Allergies  ?Allergen Reactions  ? Hctz [Hydrochlorothiazide] Itching  ?  ? ?Review of Systems  ?Constitutional: Negative.   ?Respiratory: Negative.    ?Cardiovascular: Negative.   ?Gastrointestinal: Negative.   ?Neurological: Negative.   ?Psychiatric/Behavioral: Negative.     ? ?Today's Vitals  ? 04/14/21 0857  ?BP: 128/76  ?Pulse: (!) 57  ?Temp: 98.3 ?F (36.8 ?C)  ?TempSrc: Oral  ?Weight: 166 lb (75.3 kg)  ?Height: 5' 2.6" (1.59 m)  ? ?Body mass index is  29.78 kg/m?.  ?Wt Readings from Last 3 Encounters:  ?04/14/21 166 lb (75.3 kg)  ?04/08/21 166 lb (75.3 kg)  ?04/02/21 166 lb 6.4 oz (75.5 kg)  ? ? ?Objective:  ?Physical Exam ?Vitals and nursing note reviewed.  ?Constitutional:   ?   Appearance: Normal appearance.  ?HENT:  ?   Head: Normocephalic and atraumatic.  ?   Nose:  ?   Comments: Masked ?   Mouth/Throat:  ?   Comments: Masked  ?Eyes:  ?   Extraocular Movements: Extraocular movements intact.  ?Cardiovascular:  ?   Rate and Rhythm: Normal rate and regular rhythm.  ?   Heart sounds: Normal heart sounds.  ?Pulmonary:  ?   Effort: Pulmonary effort is normal.  ?   Breath sounds: Normal breath sounds.  ?Musculoskeletal:  ?   Cervical back: Normal range of motion.  ?Skin: ?   General:  Skin is warm.  ?Neurological:  ?   General: No focal deficit present.  ?   Mental Status: She is alert.  ?Psychiatric:     ?   Mood and Affect: Mood normal.     ?   Behavior: Behavior normal.  ?   ?Assessment And Plan:  ?   ?1. Pure hypercholesterolemia ?Comments: Chronic, I will check fasting lipid panel and ALT today. LDL goal <70.   ?- Lipid panel ?- ALT ? ?2. Multiple lacunar infarcts ([place]) ?Comments: Neuro eval reviewed, their input is appreciated. MRI pending. Again, LDL goal <70. ?  ?Patient was given opportunity to ask questions. Patient verbalized understanding of the plan and was able to repeat key elements of the plan. All questions were answered to their satisfaction.  ? ?I, Maximino Greenland, MD, have reviewed all documentation for this visit. The documentation on 04/27/21 for the exam, diagnosis, procedures, and orders are all accurate and complete.  ? ?IF YOU HAVE BEEN REFERRED TO A SPECIALIST, IT MAY TAKE 1-2 WEEKS TO SCHEDULE/PROCESS THE REFERRAL. IF YOU HAVE NOT HEARD FROM US/SPECIALIST IN TWO WEEKS, PLEASE GIVE [place] A CALL AT [phone].  ? ?THE PATIENT IS ENCOURAGED TO PRACTICE SOCIAL DISTANCING DUE TO THE COVID-19 PANDEMIC.   ?

## 2021-04-14 NOTE — Patient Instructions (Signed)

## 2021-04-15 ENCOUNTER — Encounter: Payer: Self-pay | Admitting: Internal Medicine

## 2021-04-15 LAB — LIPID PANEL
Chol/HDL Ratio: 3.5 ratio (ref 0.0–4.4)
Cholesterol, Total: 183 mg/dL (ref 100–199)
HDL: 52 mg/dL (ref >39)
LDL Chol Calc (NIH): 103 mg/dL — ABNORMAL HIGH (ref 0–99)
Triglycerides: 162 mg/dL — ABNORMAL HIGH (ref 0–149)
VLDL Cholesterol Cal: 28 mg/dL (ref 5–40)

## 2021-04-15 LAB — ALT: ALT: 24 IU/L (ref 0–32)

## 2021-04-16 ENCOUNTER — Ambulatory Visit
Admission: RE | Admit: 2021-04-16 | Discharge: 2021-04-16 | Disposition: A | Discharge status: Home / Self Care | Payer: Medicare Other | Source: Ambulatory Visit | Attending: Internal Medicine | Admitting: Internal Medicine

## 2021-04-16 DIAGNOSIS — Z780 Asymptomatic menopausal state: Secondary | ICD-10-CM | POA: Diagnosis not present

## 2021-04-16 DIAGNOSIS — Z1231 Encounter for screening mammogram for malignant neoplasm of breast: Secondary | ICD-10-CM

## 2021-04-16 DIAGNOSIS — E2839 Other primary ovarian failure: Secondary | ICD-10-CM

## 2021-04-16 DIAGNOSIS — M8589 Other specified disorders of bone density and structure, multiple sites: Secondary | ICD-10-CM | POA: Diagnosis not present

## 2021-04-20 ENCOUNTER — Encounter: Payer: Self-pay | Admitting: Internal Medicine

## 2021-04-20 ENCOUNTER — Other Ambulatory Visit: Payer: Self-pay

## 2021-04-20 MED ORDER — SCOPOLAMINE 1 MG/3DAYS TD PT72: 1.0000 | MEDICATED_PATCH | TRANSDERMAL | 0 refills | Status: DC

## 2021-04-24 ENCOUNTER — Encounter: Payer: Self-pay | Admitting: Neurology

## 2021-05-08 ENCOUNTER — Other Ambulatory Visit (HOSPITAL_COMMUNITY): Payer: Medicare Other

## 2021-05-12 ENCOUNTER — Ambulatory Visit
Admission: RE | Admit: 2021-05-12 | Discharge: 2021-05-12 | Disposition: A | Discharge status: Home / Self Care | Payer: Medicare Other | Source: Ambulatory Visit | Attending: Neurology | Admitting: Neurology

## 2021-05-12 DIAGNOSIS — I6381 Other cerebral infarction due to occlusion or stenosis of small artery: Secondary | ICD-10-CM | POA: Diagnosis not present

## 2021-05-12 DIAGNOSIS — E785 Hyperlipidemia, unspecified: Secondary | ICD-10-CM

## 2021-05-12 DIAGNOSIS — E663 Overweight: Secondary | ICD-10-CM

## 2021-05-12 DIAGNOSIS — G4733 Obstructive sleep apnea (adult) (pediatric): Secondary | ICD-10-CM

## 2021-05-12 MED ORDER — GADOPICLENOL 0.5 MMOL/ML IV SOLN
7.5000 mL | Freq: Once | INTRAVENOUS | Status: AC | PRN
  Administered 2021-05-12: 7.5 mL via INTRAVENOUS

## 2021-05-14 ENCOUNTER — Ambulatory Visit (HOSPITAL_COMMUNITY)
Admission: RE | Admit: 2021-05-14 | Discharge: 2021-05-14 | Disposition: A | Discharge status: Home / Self Care | Payer: Medicare Other | Source: Ambulatory Visit | Attending: Neurology | Admitting: Neurology

## 2021-05-14 DIAGNOSIS — E663 Overweight: Secondary | ICD-10-CM | POA: Diagnosis not present

## 2021-05-14 DIAGNOSIS — Z6825 Body mass index (BMI) 25.0-25.9, adult: Secondary | ICD-10-CM | POA: Diagnosis not present

## 2021-05-14 DIAGNOSIS — I080 Rheumatic disorders of both mitral and aortic valves: Secondary | ICD-10-CM | POA: Diagnosis not present

## 2021-05-14 DIAGNOSIS — G4733 Obstructive sleep apnea (adult) (pediatric): Secondary | ICD-10-CM | POA: Diagnosis not present

## 2021-05-14 DIAGNOSIS — I10 Essential (primary) hypertension: Secondary | ICD-10-CM | POA: Insufficient documentation

## 2021-05-14 DIAGNOSIS — E785 Hyperlipidemia, unspecified: Secondary | ICD-10-CM

## 2021-05-14 DIAGNOSIS — I6381 Other cerebral infarction due to occlusion or stenosis of small artery: Secondary | ICD-10-CM | POA: Insufficient documentation

## 2021-05-14 LAB — ECHOCARDIOGRAM COMPLETE
AV Peak grad: 9.6 mmHg
Ao pk vel: 1.55 m/s
Area-P 1/2: 3.77 cm2
S' Lateral: 2.6 cm

## 2021-05-18 ENCOUNTER — Telehealth: Payer: Self-pay

## 2021-05-18 DIAGNOSIS — L308 Other specified dermatitis: Secondary | ICD-10-CM | POA: Diagnosis not present

## 2021-05-18 DIAGNOSIS — Z79899 Other long term (current) drug therapy: Secondary | ICD-10-CM | POA: Diagnosis not present

## 2021-05-18 DIAGNOSIS — L658 Other specified nonscarring hair loss: Secondary | ICD-10-CM | POA: Diagnosis not present

## 2021-05-18 DIAGNOSIS — L853 Xerosis cutis: Secondary | ICD-10-CM | POA: Diagnosis not present

## 2021-05-18 DIAGNOSIS — L659 Nonscarring hair loss, unspecified: Secondary | ICD-10-CM | POA: Diagnosis not present

## 2021-05-18 DIAGNOSIS — L811 Chloasma: Secondary | ICD-10-CM | POA: Diagnosis not present

## 2021-05-18 NOTE — Telephone Encounter (Signed)
-----   Message from Star Age, MD sent at 05/13/2021  5:35 PM EDT ----- ?Please call patient and advise her that her recent brain MRI did not show any acute findings, no new strokelike changes, stable findings of prior small strokes were seen which were demonstrated on her prior CT scan. No significant evidence of hardening of the arteries or brain shrinkage which we call atrophy.  No abnormal contrast uptake.  At this juncture, she can follow-up with her primary care as discussed. ?

## 2021-05-18 NOTE — Telephone Encounter (Signed)
I called patient. I discussed her MRI brain results. Patient will follow up with her PCP. Pt verbalized understanding of recommendations. Pt had no questions at this time but was encouraged to call back if questions arise. ? ?

## 2021-05-18 NOTE — Telephone Encounter (Signed)
I called patient. I discussed her echocardiogram results and recommendations. Patient will follow up with her PCP as planned. Pt verbalized understanding of results. Pt had no questions at this time but was encouraged to call back if questions arise. ? ?

## 2021-05-18 NOTE — Telephone Encounter (Signed)
-----   Message from Star Age, MD sent at 05/15/2021 10:44 AM EDT ----- ?Please call patient and advise her that her recent heart ultrasound/echocardiogram from 05/14/2021 did not show any significant abnormalities.  In particular, no significant problem with her heart valves or chambers, no blood clot.  As discussed, she can follow-up with her primary care and other specialists as scheduled/planned. ?

## 2021-05-25 ENCOUNTER — Encounter: Payer: Self-pay | Admitting: Internal Medicine

## 2021-06-02 ENCOUNTER — Ambulatory Visit: Payer: Medicare Other | Admitting: Internal Medicine

## 2021-08-03 ENCOUNTER — Other Ambulatory Visit: Payer: Self-pay | Admitting: Internal Medicine

## 2021-09-03 ENCOUNTER — Ambulatory Visit: Payer: Medicare Other

## 2021-09-03 ENCOUNTER — Ambulatory Visit (INDEPENDENT_AMBULATORY_CARE_PROVIDER_SITE_OTHER): Payer: Medicare Other

## 2021-09-03 ENCOUNTER — Ambulatory Visit (INDEPENDENT_AMBULATORY_CARE_PROVIDER_SITE_OTHER): Payer: Medicare Other | Admitting: Internal Medicine

## 2021-09-03 ENCOUNTER — Ambulatory Visit: Payer: Medicare Other | Admitting: Internal Medicine

## 2021-09-03 ENCOUNTER — Encounter: Payer: Self-pay | Admitting: Internal Medicine

## 2021-09-03 VITALS — BP 130/80 | HR 61 | Temp 98.0°F | Ht 62.0 in | Wt 166.0 lb

## 2021-09-03 VITALS — BP 130/80 | HR 61 | Temp 98.0°F | Ht 62.0 in | Wt 166.6 lb

## 2021-09-03 DIAGNOSIS — Z0000 Encounter for general adult medical examination without abnormal findings: Secondary | ICD-10-CM

## 2021-09-03 DIAGNOSIS — Z23 Encounter for immunization: Secondary | ICD-10-CM

## 2021-09-03 DIAGNOSIS — E7800 Pure hypercholesterolemia, unspecified: Secondary | ICD-10-CM

## 2021-09-03 DIAGNOSIS — I6381 Other cerebral infarction due to occlusion or stenosis of small artery: Secondary | ICD-10-CM | POA: Diagnosis not present

## 2021-09-03 DIAGNOSIS — I10 Essential (primary) hypertension: Secondary | ICD-10-CM | POA: Diagnosis not present

## 2021-09-03 MED ORDER — LISINOPRIL 20 MG PO TABS
20.0000 mg | ORAL_TABLET | Freq: Every day | ORAL | 2 refills | Status: DC
Start: 2021-09-03 — End: 2021-11-03

## 2021-09-03 MED ORDER — AMLODIPINE BESYLATE 5 MG PO TABS: 5.0000 mg | ORAL_TABLET | Freq: Every day | ORAL | 2 refills | Status: DC

## 2021-09-03 NOTE — Patient Instructions (Signed)
Marissa Orozco , Thank you for taking time to come for your Medicare Wellness Visit. I appreciate your ongoing commitment to your health goals. Please review the following plan we discussed and let me know if I can assist you in the future.   Screening recommendations/referrals: Colonoscopy: completed 05/27/2017, due 05/28/2022 Mammogram: completed 04/16/2021, due 04/18/2022 Bone Density: completed 04/16/2021 Recommended yearly ophthalmology/optometry visit for glaucoma screening and checkup Recommended yearly dental visit for hygiene and checkup  Vaccinations: Influenza vaccine: due Pneumococcal vaccine: completed 01/02/2021 Tdap vaccine: completed 05/22/2012, 05/23/2022 Shingles vaccine: first dose today   Covid-19: 10/22/2020, 11/02/2019, 03/02/2019, 01/26/2019  Advanced directives: Please bring a copy of your POA (Power of Attorney) and/or Living Will to your next appointment.   Conditions/risks identified: none  Next appointment: Follow up in one year for your annual wellness visit    Preventive Care 65 Years and Older, Female Preventive care refers to lifestyle choices and visits with your health care provider that can promote health and wellness. What does preventive care include? A yearly physical exam. This is also called an annual well check. Dental exams once or twice a year. Routine eye exams. Ask your health care provider how often you should have your eyes checked. Personal lifestyle choices, including: Daily care of your teeth and gums. Regular physical activity. Eating a healthy diet. Avoiding tobacco and drug use. Limiting alcohol use. Practicing safe sex. Taking low-dose aspirin every day. Taking vitamin and mineral supplements as recommended by your health care provider. What happens during an annual well check? The services and screenings done by your health care provider during your annual well check will depend on your age, overall health, lifestyle risk factors, and  family history of disease. Counseling  Your health care provider may ask you questions about your: Alcohol use. Tobacco use. Drug use. Emotional well-being. Home and relationship well-being. Sexual activity. Eating habits. History of falls. Memory and ability to understand (cognition). Work and work Statistician. Reproductive health. Screening  You may have the following tests or measurements: Height, weight, and BMI. Blood pressure. Lipid and cholesterol levels. These may be checked every 5 years, or more frequently if you are over 71 years old. Skin check. Lung cancer screening. You may have this screening every year starting at age 58 if you have a 30-pack-year history of smoking and currently smoke or have quit within the past 15 years. Fecal occult blood test (FOBT) of the stool. You may have this test every year starting at age 63. Flexible sigmoidoscopy or colonoscopy. You may have a sigmoidoscopy every 5 years or a colonoscopy every 10 years starting at age 74. Hepatitis C blood test. Hepatitis B blood test. Sexually transmitted disease (STD) testing. Diabetes screening. This is done by checking your blood sugar (glucose) after you have not eaten for a while (fasting). You may have this done every 1-3 years. Bone density scan. This is done to screen for osteoporosis. You may have this done starting at age 52. Mammogram. This may be done every 1-2 years. Talk to your health care provider about how often you should have regular mammograms. Talk with your health care provider about your test results, treatment options, and if necessary, the need for more tests. Vaccines  Your health care provider may recommend certain vaccines, such as: Influenza vaccine. This is recommended every year. Tetanus, diphtheria, and acellular pertussis (Tdap, Td) vaccine. You may need a Td booster every 10 years. Zoster vaccine. You may need this after age 63. Pneumococcal 13-valent  conjugate  (PCV13) vaccine. One dose is recommended after age 44. Pneumococcal polysaccharide (PPSV23) vaccine. One dose is recommended after age 81. Talk to your health care provider about which screenings and vaccines you need and how often you need them. This information is not intended to replace advice given to you by your health care provider. Make sure you discuss any questions you have with your health care provider. Document Released: 01/17/2015 Document Revised: 09/10/2015 Document Reviewed: 10/22/2014 Elsevier Interactive Patient Education  2017 [place] Prevention in the Home Falls can cause injuries. They can happen to people of all ages. There are many things you can do to make your home safe and to help prevent falls. What can I do on the outside of my home? Regularly fix the edges of walkways and driveways and fix any cracks. Remove anything that might make you trip as you walk through a door, such as a raised step or threshold. Trim any bushes or trees on the path to your home. Use bright outdoor lighting. Clear any walking paths of anything that might make someone trip, such as rocks or tools. Regularly check to see if handrails are loose or broken. Make sure that both sides of any steps have handrails. Any raised decks and porches should have guardrails on the edges. Have any leaves, snow, or ice cleared regularly. Use sand or salt on walking paths during winter. Clean up any spills in your garage right away. This includes oil or grease spills. What can I do in the bathroom? Use night lights. Install grab bars by the toilet and in the tub and shower. Do not use towel bars as grab bars. Use non-skid mats or decals in the tub or shower. If you need to sit down in the shower, use a plastic, non-slip stool. Keep the floor dry. Clean up any water that spills on the floor as soon as it happens. Remove soap buildup in the tub or shower regularly. Attach bath mats securely with  double-sided non-slip rug tape. Do not have throw rugs and other things on the floor that can make you trip. What can I do in the bedroom? Use night lights. Make sure that you have a light by your bed that is easy to reach. Do not use any sheets or blankets that are too big for your bed. They should not hang down onto the floor. Have a firm chair that has side arms. You can use this for support while you get dressed. Do not have throw rugs and other things on the floor that can make you trip. What can I do in the kitchen? Clean up any spills right away. Avoid walking on wet floors. Keep items that you use a lot in easy-to-reach places. If you need to reach something above you, use a strong step stool that has a grab bar. Keep electrical cords out of the way. Do not use floor polish or wax that makes floors slippery. If you must use wax, use non-skid floor wax. Do not have throw rugs and other things on the floor that can make you trip. What can I do with my stairs? Do not leave any items on the stairs. Make sure that there are handrails on both sides of the stairs and use them. Fix handrails that are broken or loose. Make sure that handrails are as long as the stairways. Check any carpeting to make sure that it is firmly attached to the stairs. Fix any carpet that  is loose or worn. Avoid having throw rugs at the top or bottom of the stairs. If you do have throw rugs, attach them to the floor with carpet tape. Make sure that you have a light switch at the top of the stairs and the bottom of the stairs. If you do not have them, ask someone to add them for you. What else can I do to help prevent falls? Wear shoes that: Do not have high heels. Have rubber bottoms. Are comfortable and fit you well. Are closed at the toe. Do not wear sandals. If you use a stepladder: Make sure that it is fully opened. Do not climb a closed stepladder. Make sure that both sides of the stepladder are locked  into place. Ask someone to hold it for you, if possible. Clearly mark and make sure that you can see: Any grab bars or handrails. First and last steps. Where the edge of each step is. Use tools that help you move around (mobility aids) if they are needed. These include: Canes. Walkers. Scooters. Crutches. Turn on the lights when you go into a dark area. Replace any light bulbs as soon as they burn out. Set up your furniture so you have a clear path. Avoid moving your furniture around. If any of your floors are uneven, fix them. If there are any pets around you, be aware of where they are. Review your medicines with your doctor. Some medicines can make you feel dizzy. This can increase your chance of falling. Ask your doctor what other things that you can do to help prevent falls. This information is not intended to replace advice given to you by your health care provider. Make sure you discuss any questions you have with your health care provider. Document Released: 10/17/2008 Document Revised: 05/29/2015 Document Reviewed: 01/25/2014 Elsevier Interactive Patient Education  2017 [employer].

## 2021-09-03 NOTE — Progress Notes (Signed)
Barnet Glasgow Martin,acting as a Education administrator for Maximino Greenland, MD.,have documented all relevant documentation on the behalf of Maximino Greenland, MD,as directed by  Maximino Greenland, MD while in the presence of Maximino Greenland, MD.    Subjective:     Patient ID: Marissa Orozco , female    DOB: September 29, 1945 , 76 y.o.   MRN: [id number]   Chief Complaint  Patient presents with   Hypertension    HPI  The patient is here for a follow-up on her blood pressure today.  She reports compliance with meds. She denies headaches, chest pain and shortness of breath. Patient is also getting lipid panel today. She denies having any concerns at this time.   She is also scheduled for AWV with [facility] Advisor.   BP Readings from Last 3 Encounters: 09/03/21 : 130/80 04/14/21 : 128/76 04/08/21 : (!) 143/80    Hypertension This is a chronic problem. The current episode started more than 1 year ago. The problem is controlled. Pertinent negatives include no blurred vision, chest pain, palpitations or shortness of breath. Risk factors for coronary artery disease include dyslipidemia and post-menopausal state. Past treatments include ACE inhibitors. The current treatment provides moderate improvement.     Past Medical History:  Diagnosis Date   Breast lump    Colon polyps    Fibroids    GERD (gastroesophageal reflux disease)    Glaucoma    Hyperlipidemia 1986   Hypertension 1986   Interstitial cystitis 1999   Kidney stones 2005   Osteoporosis    Tonsillitis      Family History  Problem Relation Age of Onset   Coronary artery disease Father        mother, PGM   Stroke Father    Hypertension Father    Colon cancer Father 71   Hypertension Mother    Heart disease Mother    Colon cancer Mother 34   Diabetes Other    Colon cancer Maternal Grandmother    Breast cancer Neg Hx      Current Outpatient Medications:    atorvastatin (LIPITOR) 40 MG tablet, TAKE 1 TABLET DAILY (STOP  PRAVASTATIN), Disp: 90  tablet, Rfl: 1   CALCIUM PO, Take 1 tablet by mouth daily. 500 mg daily, Disp: , Rfl:    Cholecalciferol (VITAMIN D PO), Take 1 tablet by mouth daily. 2000 units daily, Disp: , Rfl:    dorzolamide-timolol (COSOPT) 22.3-6.8 MG/ML ophthalmic solution, , Disp: , Rfl:    finasteride (PROSCAR) 5 MG tablet, Take 5 mg by mouth daily., Disp: , Rfl:    latanoprost (XALATAN) 0.005 % ophthalmic solution, Place 1 drop into both eyes daily., Disp: , Rfl:    amLODipine (NORVASC) 5 MG tablet, Take 1 tablet (5 mg total) by mouth daily., Disp: 90 tablet, Rfl: 2   lisinopril (ZESTRIL) 20 MG tablet, Take 1 tablet (20 mg total) by mouth daily., Disp: 90 tablet, Rfl: 2   Allergies  Allergen Reactions   Hctz [Hydrochlorothiazide] Itching     Review of Systems  Constitutional: Negative.   Eyes:  Negative for blurred vision.  Respiratory: Negative.  Negative for shortness of breath.   Cardiovascular: Negative.  Negative for chest pain and palpitations.  Gastrointestinal: Negative.   Neurological: Negative.   Psychiatric/Behavioral: Negative.       Today's Vitals   09/03/21 0917  BP: 130/80  Pulse: 61  Temp: 98 F (36.7 C)  TempSrc: Oral  Weight: 166 lb 9.6 oz (75.6 kg)  Height: _0  (1.575 m)  PainSc: 0-No pain   Body mass index is 30.47 kg/m.  Wt Readings from Last 3 Encounters:  09/03/21 166 lb (75.3 kg)  09/03/21 166 lb 9.6 oz (75.6 kg)  04/14/21 166 lb (75.3 kg)    Objective:  Physical Exam Vitals and nursing note reviewed.  Constitutional:      Appearance: Normal appearance.  HENT:     Head: Normocephalic and atraumatic.  Cardiovascular:     Rate and Rhythm: Normal rate and regular rhythm.     Heart sounds: Normal heart sounds.  Pulmonary:     Effort: Pulmonary effort is normal.     Breath sounds: Normal breath sounds.  Skin:    General: Skin is warm.  Neurological:     General: No focal deficit present.     Mental Status: She is alert.  Psychiatric:        Mood and Affect:  Mood normal.        Behavior: Behavior normal.         Assessment And Plan:     1. Pure hypercholesterolemia Comments: April 2023 labs reviewed. LDL above goal at 103. LDL goal <70. I will increase atorvastatin to 27m daily. She agrees to rto in 6-8 weeks for chol check.  - Lipid panel; Future - CMP14+EGFR; Future  2. HTN (hypertension), malignant Comments: Chronic, controlled. She will continue with amlodipine 564mand lisinopril 2072maily. She is encouraged to follow low sodium diet.  - CMP14+EGFR; Future  3. Immunization due Comments: She was given Shingrix IM x 1, billed via [facility].  - Zoster Recombinant (Shingrix )  Patient was given opportunity to ask questions. Patient verbalized understanding of the plan and was able to repeat key elements of the plan. All questions were answered to their satisfaction.   I, RobMaximino GreenlandD, have reviewed all documentation for this visit. The documentation on 09/03/21 for the exam, diagnosis, procedures, and orders are all accurate and complete.   IF YOU HAVE BEEN REFERRED TO A SPECIALIST, IT MAY TAKE 1-2 WEEKS TO SCHEDULE/PROCESS THE REFERRAL. IF YOU HAVE NOT HEARD FROM US/SPECIALIST IN TWO WEEKS, PLEASE GIVE [place] AT [phone] X [phone].   THE PATIENT IS ENCOURAGED TO PRACTICE SOCIAL DISTANCING DUE TO THE COVID-19 PANDEMIC.

## 2021-09-03 NOTE — Patient Instructions (Signed)
Hypertension, Adult ?Hypertension is another name for high blood pressure. High blood pressure forces your heart to work harder to pump blood. This can cause problems over time. ?There are two numbers in a blood pressure reading. There is a top number (systolic) over a bottom number (diastolic). It is best to have a blood pressure that is below 120/80. ?What are the causes? ?The cause of this condition is not known. Some other conditions can lead to high blood pressure. ?What increases the risk? ?Some lifestyle factors can make you more likely to develop high blood pressure: ?Smoking. ?Not getting enough exercise or physical activity. ?Being overweight. ?Having too much fat, sugar, calories, or salt (sodium) in your diet. ?Drinking too much alcohol. ?Other risk factors include: ?Having any of these conditions: ?Heart disease. ?Diabetes. ?High cholesterol. ?Kidney disease. ?Obstructive sleep apnea. ?Having a family history of high blood pressure and high cholesterol. ?Age. The risk increases with age. ?Stress. ?What are the signs or symptoms? ?High blood pressure may not cause symptoms. Very high blood pressure (hypertensive crisis) may cause: ?Headache. ?Fast or uneven heartbeats (palpitations). ?Shortness of breath. ?Nosebleed. ?Vomiting or feeling like you may vomit (nauseous). ?Changes in how you see. ?Very bad chest pain. ?Feeling dizzy. ?Seizures. ?How is this treated? ?This condition is treated by making healthy lifestyle changes, such as: ?Eating healthy foods. ?Exercising more. ?Drinking less alcohol. ?Your doctor may prescribe medicine if lifestyle changes do not help enough and if: ?Your top number is above 130. ?Your bottom number is above 80. ?Your personal target blood pressure may vary. ?Follow these instructions at home: ?Eating and drinking ? ?If told, follow the DASH eating plan. To follow this plan: ?Fill one half of your plate at each meal with fruits and vegetables. ?Fill one fourth of your plate  at each meal with whole grains. Whole grains include whole-wheat pasta, brown rice, and whole-grain bread. ?Eat or drink low-fat dairy products, such as skim milk or low-fat yogurt. ?Fill one fourth of your plate at each meal with low-fat (lean) proteins. Low-fat proteins include fish, chicken without skin, eggs, beans, and tofu. ?Avoid fatty meat, cured and processed meat, or chicken with skin. ?Avoid pre-made or processed food. ?Limit the amount of salt in your diet to less than 1,500 mg each day. ?Do not drink alcohol if: ?Your doctor tells you not to drink. ?You are pregnant, may be pregnant, or are planning to become pregnant. ?If you drink alcohol: ?Limit how much you have to: ?0-1 drink a day for women. ?0-2 drinks a day for men. ?Know how much alcohol is in your drink. In the [place] one drink equals one 12 oz bottle of beer (355 mL), one 5 oz glass of wine (148 mL), or one 1? oz glass of hard liquor (44 mL). ?Lifestyle ? ?Work with your doctor to stay at a healthy weight or to lose weight. Ask your doctor what the best weight is for you. ?Get at least 30 minutes of exercise that causes your heart to beat faster (aerobic exercise) most days of the week. This may include walking, swimming, or biking. ?Get at least 30 minutes of exercise that strengthens your muscles (resistance exercise) at least 3 days a week. This may include lifting weights or doing Pilates. ?Do not smoke or use any products that contain nicotine or tobacco. If you need help quitting, ask your doctor. ?Check your blood pressure at home as told by your doctor. ?Keep all follow-up visits. ?Medicines ?Take over-the-counter and prescription medicines   only as told by your doctor. Follow directions carefully. ?Do not skip doses of blood pressure medicine. The medicine does not work as well if you skip doses. Skipping doses also puts you at risk for problems. ?Ask your doctor about side effects or reactions to medicines that you should watch  for. ?Contact a doctor if: ?You think you are having a reaction to the medicine you are taking. ?You have headaches that keep coming back. ?You feel dizzy. ?You have swelling in your ankles. ?You have trouble with your vision. ?Get help right away if: ?You get a very bad headache. ?You start to feel mixed up (confused). ?You feel weak or numb. ?You feel faint. ?You have very bad pain in your: ?Chest. ?Belly (abdomen). ?You vomit more than once. ?You have trouble breathing. ?These symptoms may be an emergency. Get help right away. Call 911. ?Do not wait to see if the symptoms will go away. ?Do not drive yourself to the hospital. ?Summary ?Hypertension is another name for high blood pressure. ?High blood pressure forces your heart to work harder to pump blood. ?For most people, a normal blood pressure is less than 120/80. ?Making healthy choices can help lower blood pressure. If your blood pressure does not get lower with healthy choices, you may need to take medicine. ?This information is not intended to replace advice given to you by your health care provider. Make sure you discuss any questions you have with your health care provider. ?Document Revised: 10/09/2020 Document Reviewed: 10/09/2020 ?Elsevier Patient Education ? 2023 Elsevier Inc. ? ?

## 2021-09-03 NOTE — Progress Notes (Signed)
Subjective:   Marissa Orozco is a 76 y.o. female who presents for Medicare Annual (Subsequent) preventive examination.  Review of Systems     Cardiac Risk Factors include: advanced age (>66mn, >>14women);hypertension;obesity (BMI >30kg/m2)     Objective:    Today's Vitals   09/03/21 0930  BP: 130/80  Pulse: 61  Temp: 98 F (36.7 C)  TempSrc: Oral  Weight: 166 lb (75.3 kg)  Height: '5\' 2"'$  (1.575 m)   Body mass index is 30.36 kg/m.     09/03/2021    9:39 AM 08/06/2020   11:26 AM 07/19/2019    9:26 AM 05/18/2018    3:38 PM 05/27/2017    2:26 PM 05/22/2013    1:29 PM  Advanced Directives  Does Patient Have a Medical Advance Directive? Yes Yes Yes Yes Yes Patient has advance directive, copy not in chart  Type of Advance Directive [place]Living will [place]Living will HC[place]Living will HBrimfieldLiving will HSunrise ManorLiving will HBenton Heightsin Chart? No - copy requested No - copy requested No - copy requested No - copy requested      Current Medications (verified) Outpatient Encounter Medications as of 09/03/2021  Medication Sig   atorvastatin (LIPITOR) 40 MG tablet TAKE 1 TABLET DAILY (STOP  PRAVASTATIN)   CALCIUM PO Take 1 tablet by mouth daily. 500 mg daily   Cholecalciferol (VITAMIN D PO) Take 1 tablet by mouth daily. 2000 units daily   dorzolamide-timolol (COSOPT) 22.3-6.8 MG/ML ophthalmic solution    finasteride (PROSCAR) 5 MG tablet Take 5 mg by mouth daily.   latanoprost (XALATAN) 0.005 % ophthalmic solution Place 1 drop into both eyes daily.   [DISCONTINUED] amLODipine (NORVASC) 5 MG tablet Take 1 tablet (5 mg total) by mouth daily.   [DISCONTINUED] lisinopril (ZESTRIL) 20 MG tablet Take 1 tablet (20 mg total) by mouth daily.   No facility-administered encounter medications on file as of 09/03/2021.     Allergies (verified) Hctz [hydrochlorothiazide]   History: Past Medical History:  Diagnosis Date   Breast lump    Colon polyps    Fibroids    GERD (gastroesophageal reflux disease)    Glaucoma    Hyperlipidemia 1986   Hypertension 1986   Interstitial cystitis 1999   Kidney stones 2005   Osteoporosis    Tonsillitis    Past Surgical History:  Procedure Laterality Date   BREAST EXCISIONAL BIOPSY Right 07/27/2006   Intraductal Papilloma   CATARACT EXTRACTION Bilateral 02/2020   other in 05/2020   COLONOSCOPY  2010   Dr MCollene Mares  HYSTEROSCOPY  1993   LAPAROSCOPIC HYSTERECTOMY  1994   MYOMECTOMY  1993   excision of tumor   [facility]  Family History  Problem Relation Age of Onset   Coronary artery disease Father        mother, PGM   Stroke Father    Hypertension Father    Colon cancer Father 65  Hypertension Mother    Heart disease Mother    Colon cancer Mother 885  Diabetes Other    Colon cancer Maternal Grandmother    Breast cancer Neg Hx    Social History   Socioeconomic History   Marital status: Married    Spouse name: Not on file   Number of children: 2   Years of education: Not  on file   Highest education level: Not on file  Occupational History   Occupation: retired  Tobacco Use   Smoking status: Never   Smokeless tobacco: Never  Vaping Use   Vaping Use: Never used  Substance and Sexual Activity   Alcohol use: Yes    Alcohol/week: 12.0 standard drinks of alcohol    Types: 6 Standard drinks or equivalent, 6 Glasses of wine per week   Drug use: No   Sexual activity: Not Currently  Other Topics Concern   Not on file  Social History Narrative   Not on file   Social Determinants of Health   Financial Resource Strain: Low Risk  (09/03/2021)   Overall Financial Resource Strain (CARDIA)    Difficulty of Paying Living Expenses: Not hard at all  Food Insecurity: No Food Insecurity (09/03/2021)   Hunger Vital Sign     Worried About Running Out of Food in the Last Year: Never true    Ran Out of Food in the Last Year: Never true  Transportation Needs: No Transportation Needs (09/03/2021)   PRAPARE - Hydrologist (Medical): No    Lack of Transportation (Non-Medical): No  Physical Activity: Inactive (09/03/2021)   Exercise Vital Sign    Days of Exercise per Week: 0 days    Minutes of Exercise per Session: 0 min  Stress: No Stress Concern Present (09/03/2021)   [place]    Feeling of Stress : Not at all  Social Connections: Not on file    Tobacco Counseling Counseling given: Not Answered   Clinical Intake:  Pre-visit preparation completed: Yes  Pain : No/denies pain     Nutritional Status: BMI > 30  Obese Nutritional Risks: None Diabetes: No  How often do you need to have someone help you when you read instructions, pamphlets, or other written materials from your doctor or pharmacy?: 1 - Never What is the last grade level you completed in school?: doctorate  Diabetic? no  Interpreter Needed?: No  Information entered by :: NAllen LPN   Activities of Daily Living    09/03/2021    9:40 AM  In your present state of health, do you have any difficulty performing the following activities:  Hearing? 0  Vision? 0  Difficulty concentrating or making decisions? 0  Walking or climbing stairs? 0  Dressing or bathing? 0  Doing errands, shopping? 0  Preparing Food and eating ? N  Using the Toilet? N  In the past six months, have you accidently leaked urine? N  Do you have problems with loss of bowel control? N  Managing your Medications? N  Managing your Finances? N  Housekeeping or managing your Housekeeping? N    Patient Care Team: Glendale Chard, MD as PCP - General (Internal Medicine)  Indicate any recent Medical Services you may have received from other than [facility] providers in the past year  (date may be approximate).     Assessment:   This is a routine wellness examination for [place].  Hearing/Vision screen Vision Screening - Comments:: Regular eye exams, [facility]  Dietary issues and exercise activities discussed: Current Exercise Habits: The patient does not participate in regular exercise at present   Goals Addressed             This Visit's Progress    Patient Stated       09/03/2021, wants to lose weight  Depression Screen    09/03/2021    9:40 AM 09/03/2021    9:16 AM 08/06/2020   11:26 AM 07/19/2019    9:27 AM 05/18/2018    3:39 PM 05/18/2018    2:32 PM 11/02/2017   10:53 AM  PHQ 2/9 Scores  PHQ - 2 Score 0 0 0 0 0 0 0  PHQ- 9 Score    1 0      Fall Risk    09/03/2021    9:40 AM 09/03/2021    9:16 AM 08/06/2020   11:26 AM 07/19/2019    9:26 AM 11/20/2018   10:18 AM  Fall Risk   Falls in the past year? 0 0 0 0 0  Number falls in past yr: 0 0     Injury with Fall? 0 0     Risk for fall due to : No Fall Risks No Fall Risks Medication side effect Medication side effect   Follow up Falls prevention discussed;Education provided;Falls evaluation completed Falls evaluation completed Falls evaluation completed;Education provided;Falls prevention discussed Falls evaluation completed;Education provided;Falls prevention discussed     FALL RISK PREVENTION PERTAINING TO THE HOME:  Any stairs in or around the home? Yes  If so, are there any without handrails? No  Home free of loose throw rugs in walkways, pet beds, electrical cords, etc? Yes  Adequate lighting in your home to reduce risk of falls? Yes   ASSISTIVE DEVICES UTILIZED TO PREVENT FALLS:  Life alert? No  Use of a cane, walker or w/c? No  Grab bars in the bathroom? No  Shower chair or bench in shower? Yes  Elevated toilet seat or a handicapped toilet? No   TIMED UP AND GO:  Was the test performed? Yes .  Length of time to ambulate 10 feet: 5 sec.   Gait steady and fast without  use of assistive device  Cognitive Function:        09/03/2021    9:40 AM 08/06/2020   11:27 AM 07/19/2019    9:28 AM 05/18/2018    3:41 PM  6CIT Screen  What Year? 0 points 0 points 0 points 0 points  What month? 0 points 0 points 0 points 0 points  What time? 0 points 0 points 0 points 0 points  Count back from 20 0 points 0 points 0 points 0 points  Months in reverse 0 points 0 points 0 points 0 points  Repeat phrase 2 points 2 points 0 points 0 points  Total Score 2 points 2 points 0 points 0 points    Immunizations Immunization History  Administered Date(s) Administered   Fluad Quad(high Dose 65+) 10/17/2019, 10/22/2020   Influenza, High Dose Seasonal PF 11/20/2018   Influenza-Unspecified 09/02/2017   Moderna SARS-COV2 Booster Vaccination 11/02/2019, 10/22/2020   Moderna Sars-Covid-2 Vaccination 01/26/2019, 03/02/2019   PNEUMOCOCCAL CONJUGATE-20 01/02/2021   Pneumococcal Polysaccharide-23 01/30/2018   Tdap 06/01/2012   Zoster Recombinat (Shingrix) 09/03/2021   Zoster, Live 10/17/2012    TDAP status: Up to date  Flu Vaccine status: Due, Education has been provided regarding the importance of this vaccine. Advised may receive this vaccine at local pharmacy or Health Dept. Aware to provide a copy of the vaccination record if obtained from local pharmacy or Health Dept. Verbalized acceptance and understanding.  Pneumococcal vaccine status: Up to date  Covid-19 vaccine status: Completed vaccines  Qualifies for Shingles Vaccine? Yes   Zostavax completed Yes   Shingrix Completed?: first dose today  Screening Tests Health Maintenance  Topic Date Due   COVID-19 Vaccine (3 - Moderna risk series) 11/19/2020   INFLUENZA VACCINE  08/04/2021   Zoster Vaccines- Shingrix (2 of 2) 10/29/2021   COLONOSCOPY (Pts 45-16yr Insurance coverage will need to be confirmed)  05/28/2022   TETANUS/TDAP  06/02/2022   Pneumonia Vaccine 76 Years old  Completed   DEXA SCAN  Completed    Hepatitis C Screening  Completed   HPV VACCINES  Aged Out    Health Maintenance  Health Maintenance Due  Topic Date Due   COVID-19 Vaccine (3 - Moderna risk series) 11/19/2020   INFLUENZA VACCINE  08/04/2021    Colorectal cancer screening: Type of screening: Colonoscopy. Completed 05/27/2017. Repeat every 5 years  Mammogram status: Completed 04/16/2021. Repeat every year  Bone Density status: Completed 04/16/2021.   Lung Cancer Screening: (Low Dose CT Chest recommended if Age 77-80years, 30 pack-year currently smoking OR have quit w/in 15years.) does not qualify.   Lung Cancer Screening Referral: no  Additional Screening:  Hepatitis C Screening: does qualify; Completed 04/13/2012  Vision Screening: Recommended annual ophthalmology exams for early detection of glaucoma and other disorders of the eye. Is the patient up to date with their annual eye exam?  Yes  Who is the provider or what is the name of the office in which the patient attends annual eye exams? [facility] is not established with a provider, would they like to be referred to a provider to establish care? No .   Dental Screening: Recommended annual dental exams for proper oral hygiene  Community Resource Referral / Chronic Care Management: CRR required this visit?  No   CCM required this visit?  No      Plan:     I have personally reviewed and noted the following in the patient's chart:   Medical and social history Use of alcohol, tobacco or illicit drugs  Current medications and supplements including opioid prescriptions. Patient is not currently taking opioid prescriptions. Functional ability and status Nutritional status Physical activity Advanced directives List of other physicians Hospitalizations, surgeries, and ER visits in previous 12 months Vitals Screenings to include cognitive, depression, and falls Referrals and appointments  In addition, I have reviewed and discussed with  patient certain preventive protocols, quality metrics, and best practice recommendations. A written personalized care plan for preventive services as well as general preventive health recommendations were provided to patient.     NKellie Simmering LPN   [id number]  Nurse Notes: none

## 2021-10-14 ENCOUNTER — Encounter: Payer: Self-pay | Admitting: Internal Medicine

## 2021-10-14 ENCOUNTER — Other Ambulatory Visit: Payer: Self-pay

## 2021-10-14 MED ORDER — ATORVASTATIN CALCIUM 80 MG PO TABS: 40.0000 mg | ORAL_TABLET | Freq: Every day | ORAL | 1 refills | Status: DC

## 2021-10-15 DIAGNOSIS — H401131 Primary open-angle glaucoma, bilateral, mild stage: Secondary | ICD-10-CM | POA: Diagnosis not present

## 2021-10-15 DIAGNOSIS — H02403 Unspecified ptosis of bilateral eyelids: Secondary | ICD-10-CM | POA: Diagnosis not present

## 2021-10-19 ENCOUNTER — Other Ambulatory Visit: Payer: Self-pay

## 2021-10-19 MED ORDER — ATORVASTATIN CALCIUM 80 MG PO TABS: 80.0000 mg | ORAL_TABLET | Freq: Every day | ORAL | 1 refills | Status: DC

## 2021-10-22 ENCOUNTER — Other Ambulatory Visit: Payer: Medicare Other

## 2021-10-22 DIAGNOSIS — I10 Essential (primary) hypertension: Secondary | ICD-10-CM | POA: Diagnosis not present

## 2021-10-22 DIAGNOSIS — E7800 Pure hypercholesterolemia, unspecified: Secondary | ICD-10-CM | POA: Diagnosis not present

## 2021-10-23 LAB — CMP14+EGFR
ALT: 18 IU/L (ref 0–32)
AST: 22 IU/L (ref 0–40)
Albumin/Globulin Ratio: 1.9 (ref 1.2–2.2)
Albumin: 4.1 g/dL (ref 3.8–4.8)
Alkaline Phosphatase: 97 IU/L (ref 44–121)
BUN/Creatinine Ratio: 16 (ref 12–28)
BUN: 14 mg/dL (ref 8–27)
Bilirubin Total: 1.3 mg/dL — ABNORMAL HIGH (ref 0.0–1.2)
CO2: 21 mmol/L (ref 20–29)
Calcium: 9.7 mg/dL (ref 8.7–10.3)
Chloride: 107 mmol/L — ABNORMAL HIGH (ref 96–106)
Creatinine, Ser: 0.85 mg/dL (ref 0.57–1.00)
Globulin, Total: 2.2 g/dL (ref 1.5–4.5)
Glucose: 97 mg/dL (ref 70–99)
Potassium: 5.1 mmol/L (ref 3.5–5.2)
Sodium: 143 mmol/L (ref 134–144)
Total Protein: 6.3 g/dL (ref 6.0–8.5)
eGFR: 71 mL/min/{1.73_m2} (ref >59)

## 2021-10-23 LAB — LIPID PANEL
Chol/HDL Ratio: 3.4 ratio (ref 0.0–4.4)
Cholesterol, Total: 185 mg/dL (ref 100–199)
HDL: 54 mg/dL (ref >39)
LDL Chol Calc (NIH): 105 mg/dL — ABNORMAL HIGH (ref 0–99)
Triglycerides: 148 mg/dL (ref 0–149)
VLDL Cholesterol Cal: 26 mg/dL (ref 5–40)

## 2021-10-28 ENCOUNTER — Other Ambulatory Visit: Payer: Self-pay

## 2021-10-28 DIAGNOSIS — E7800 Pure hypercholesterolemia, unspecified: Secondary | ICD-10-CM

## 2021-10-28 MED ORDER — EZETIMIBE 10 MG PO TABS: 10.0000 mg | ORAL_TABLET | Freq: Every day | ORAL | Status: DC

## 2021-11-03 ENCOUNTER — Encounter: Payer: Self-pay | Admitting: Internal Medicine

## 2021-11-03 ENCOUNTER — Other Ambulatory Visit: Payer: Self-pay

## 2021-11-03 MED ORDER — EZETIMIBE 10 MG PO TABS: 10.0000 mg | ORAL_TABLET | Freq: Every day | ORAL | 2 refills | Status: DC

## 2021-11-12 ENCOUNTER — Encounter (HOSPITAL_BASED_OUTPATIENT_CLINIC_OR_DEPARTMENT_OTHER): Payer: Self-pay | Admitting: Emergency Medicine

## 2021-11-12 ENCOUNTER — Other Ambulatory Visit: Payer: Self-pay

## 2021-11-12 ENCOUNTER — Emergency Department (HOSPITAL_BASED_OUTPATIENT_CLINIC_OR_DEPARTMENT_OTHER): Payer: Medicare Other | Admitting: Radiology

## 2021-11-12 ENCOUNTER — Emergency Department (HOSPITAL_BASED_OUTPATIENT_CLINIC_OR_DEPARTMENT_OTHER)
Admission: EM | Admit: 2021-11-12 | Discharge: 2021-11-12 | Disposition: A | Discharge status: Home / Self Care | Payer: Medicare Other | Attending: Emergency Medicine | Admitting: Emergency Medicine

## 2021-11-12 DIAGNOSIS — M25532 Pain in left wrist: Secondary | ICD-10-CM | POA: Insufficient documentation

## 2021-11-12 DIAGNOSIS — Z79899 Other long term (current) drug therapy: Secondary | ICD-10-CM | POA: Insufficient documentation

## 2021-11-12 DIAGNOSIS — I10 Essential (primary) hypertension: Secondary | ICD-10-CM | POA: Diagnosis not present

## 2021-11-12 DIAGNOSIS — W19XXXA Unspecified fall, initial encounter: Secondary | ICD-10-CM | POA: Diagnosis not present

## 2021-11-12 MED ORDER — NAPROXEN 250 MG PO TABS
500.0000 mg | ORAL_TABLET | Freq: Once | ORAL | Status: AC
  Administered 2021-11-12: 500 mg via ORAL
  Filled 2021-11-12: qty 2

## 2021-11-12 NOTE — ED Provider Notes (Signed)
[facility] EMERGENCY DEPT Provider Note   CSN: [id number] Arrival date & time: 11/12/21  1955     History HTN Chief Complaint  Patient presents with   Wrist Pain    Marissa Orozco is a 76 y.o. female.  76 year old female with a past medical history of high blood pressure presents to the ED status post mechanical fall.  Patient reports that after removing approximately 7 hours ago she was trying to walk and brace her fall when she suddenly fell on her left wrist.  She reports pain along the aspect of the left wrist exacerbated with any movement, there is also swelling noted to the area.  She does report taking some Tylenol at 5 PM and without any improvement in symptoms.  He does report striking her head however she is currently on no blood thinners.  Reports there was no loss of consciousness.  No other injury reported.  The history is provided by the patient.  Wrist Pain This is a new problem. The current episode started 6 to 12 hours ago. The problem occurs constantly. The problem has not changed since onset.Pertinent negatives include no chest pain, no abdominal pain, no headaches and no shortness of breath. Nothing relieves the symptoms. She has tried acetaminophen for the symptoms.       Home Medications Prior to Admission medications   Medication Sig Start Date End Date Taking? Authorizing Provider  amLODipine (NORVASC) 5 MG tablet Take 1 tablet (5 mg total) by mouth daily. 09/03/21   Glendale Chard, MD  atorvastatin (LIPITOR) 80 MG tablet Take 1 tablet (80 mg total) by mouth daily. 10/19/21   Glendale Chard, MD  CALCIUM PO Take 1 tablet by mouth daily. 500 mg daily    [provider]  Cholecalciferol (VITAMIN D PO) Take 1 tablet by mouth daily. 2000 units daily    [provider]  dorzolamide-timolol (COSOPT) 22.3-6.8 MG/ML ophthalmic solution  02/02/20   [provider]  ezetimibe (ZETIA) 10 MG tablet Take 1 tablet (10 mg total) by  mouth daily. 11/03/21 11/03/22  Glendale Chard, MD  finasteride (PROSCAR) 5 MG tablet Take 5 mg by mouth daily.    [provider]  latanoprost (XALATAN) 0.005 % ophthalmic solution Place 1 drop into both eyes daily. 03/17/13   [provider]      Allergies    Hctz [hydrochlorothiazide]    Review of Systems   Review of Systems  Constitutional:  Negative for chills and fever.  HENT:  Negative for sore throat.   Respiratory:  Negative for shortness of breath.   Cardiovascular:  Negative for chest pain.  Gastrointestinal:  Negative for abdominal pain, nausea and vomiting.  Genitourinary:  Negative for flank pain.  Musculoskeletal:  Positive for arthralgias.  Skin:  Negative for pallor and wound.  Neurological:  Negative for light-headedness and headaches.  All other systems reviewed and are negative.   Physical Exam Updated Vital Signs BP (!) 144/81 (BP Location: Right Arm)   Pulse 81   Temp 98.5 F (36.9 C) (Oral)   Resp 18   SpO2 100%  Physical Exam Vitals and nursing note reviewed.  Constitutional:      Appearance: Normal appearance.  HENT:     Head: Normocephalic and atraumatic.     Comments: NO Signs of goose egg, no lacerations, no abrasions noted.    Mouth/Throat:     Mouth: Mucous membranes are moist.  Neck:     Comments: No pain with palpation of  the midline of her cervical spine. Cardiovascular:     Rate and Rhythm: Normal rate.  Pulmonary:     Effort: Pulmonary effort is normal.  Abdominal:     General: Abdomen is flat.  Musculoskeletal:        General: Swelling and tenderness present.     Right wrist: Swelling, tenderness and snuff box tenderness present. No deformity, effusion, lacerations or crepitus. Decreased range of motion. Normal pulse.     Cervical back: Normal range of motion and neck supple.     Comments: There is decrease in range of motion due to pain.  Palpable pain along the snuffbox.  Capillary refill is present, pulses  equal and symmetric  Skin:    General: Skin is warm and dry.  Neurological:     Mental Status: She is alert and oriented to person, place, and time.     ED Results / Procedures / Treatments   Labs (all labs ordered are listed, but only abnormal results are displayed) Labs Reviewed - No data to display  EKG None  Radiology DG Wrist Complete Left  Result Date: 11/12/2021 CLINICAL DATA:  Fall on left wrist with pain. EXAM: LEFT WRIST - COMPLETE 3+ VIEW COMPARISON:  None Available. FINDINGS: There is no evidence of fracture or dislocation. Degenerative changes are noted at the wrist and first carpometacarpal joint. Soft tissue swelling is present at the wrist. IMPRESSION: No acute fracture or dislocation. Electronically Signed   By: Brett Fairy M.D.   On: 11/12/2021 21:08    Procedures Procedures    Medications Ordered in ED Medications  naproxen (NAPROSYN) tablet 500 mg (has no administration in time range)    ED Course/ Medical Decision Making/ A&P                           Medical Decision Making Amount and/or Complexity of Data Reviewed Radiology: ordered.  Risk Prescription drug management.   Patient here status post fall earlier today trying to brace it with her left forearm.  Reports pain along the snuffbox worsened with any type of movement, swelling noted to the area.  Exam with pulses present and palpable, capillary refills intact.  Sensation is intact throughout unable to Penate in the left hand.  Some pain along the forearm.   Of her left wrist was ordered while in triage.  This did not show any acute fracture.  This was also evaluated by me, concern for occult fracture at this time.  She does have tenderness along the snuffbox, I do feel that placing her in conservative management with a thumb spica and repeat x-ray in a couple of days is appropriate.  Given a dose of naproxen for anti-inflammatory while in the ED.  She is agreeable of plan and treatment,  patient is hemodynamically stable for discharge.      Portions of this note were generated with [employer]. Dictation errors may occur despite best attempts at proofreading.   Final Clinical Impression(s) / ED Diagnoses Final diagnoses:  Fall, initial encounter  Left wrist pain    Rx / DC Orders ED Discharge Orders     None         Janeece Fitting, PA-C 11/12/21 2131    Sherwood Gambler, MD 11/13/21 [phone]

## 2021-11-12 NOTE — Discharge Instructions (Addendum)
The x-ray of your left wrist did not show any fracture on today's visit.  We have placed your left wrist on a thumb spica, please wear this for the next couple of days.  You may benefit from a repeat x-ray in a couple days to help distinguish any occult fractures.  Continue taking some anti-inflammatories over-the-counter to help with swelling, keep your left wrist elevated.

## 2021-11-12 NOTE — ED Triage Notes (Signed)
Fall on left wrsit. Painful to move. +pulse, +cap refill Happened around 2pm  Took tylenol around 5pm

## 2021-11-13 ENCOUNTER — Telehealth: Payer: Self-pay

## 2021-11-13 NOTE — Telephone Encounter (Signed)
Transition Care Management Unsuccessful Follow-up Telephone Call  Date of discharge and from where:  11/12/2021 [facility]   Attempts:  1st Attempt  Reason for unsuccessful TCM follow-up call:  Left voice message

## 2021-11-17 ENCOUNTER — Other Ambulatory Visit: Payer: Self-pay

## 2021-11-17 DIAGNOSIS — M25539 Pain in unspecified wrist: Secondary | ICD-10-CM

## 2021-11-18 ENCOUNTER — Ambulatory Visit (INDEPENDENT_AMBULATORY_CARE_PROVIDER_SITE_OTHER): Payer: Medicare Other | Admitting: Sports Medicine

## 2021-11-18 ENCOUNTER — Encounter: Payer: Self-pay | Admitting: Sports Medicine

## 2021-11-18 ENCOUNTER — Ambulatory Visit (INDEPENDENT_AMBULATORY_CARE_PROVIDER_SITE_OTHER): Payer: Medicare Other

## 2021-11-18 DIAGNOSIS — W19XXXA Unspecified fall, initial encounter: Secondary | ICD-10-CM

## 2021-11-18 DIAGNOSIS — I6381 Other cerebral infarction due to occlusion or stenosis of small artery: Secondary | ICD-10-CM

## 2021-11-18 DIAGNOSIS — M25532 Pain in left wrist: Secondary | ICD-10-CM

## 2021-11-18 NOTE — Progress Notes (Signed)
Marissa Orozco - 76 y.o. female MRN [id number]  Date of birth: [birth year]  Office Visit Note: Visit Date: 11/18/2021 PCP: Glendale Chard, MD Referred by: Glendale Chard, MD  Subjective: Chief Complaint  Patient presents with   Left Wrist - Pain   HPI: Marissa Orozco is a pleasant 76 y.o. female who presents today for left wrist pain and injury.  Initial injury on 11/12/2021 when she was stepping out of the shower with wet feet and she slipped and fell.  She does report hitting her head although denies any loss of consciousness.  She is unsure how she fell but feels like she may have fell onto the left wrist as she had some soreness in the coming hours.  She did go to the [facility] ED and did undergo x-rays of the wrist which did not show any evidence of fracture.  There are arthritic changes throughout the wrist and CMC joint on my review.  She was placed in a thumb spica splint and recommended follow-up with orthopedics.  Pain is improving but still has pain, 4/10.  Pertinent ROS were reviewed with the patient and found to be negative unless otherwise specified above in HPI.   Assessment & Plan: Visit Diagnoses:  1. Pain in left wrist   2. Fall, initial encounter    Plan: Discussed with Loletha possible etiology of her wrist pain.  She did have a fall onto that wrist.  I cannot see clear evidence of fracture about the wrist or carpal bones on x-ray, however she has radiostyloid TTP as well as some pain over the scaphoid tubercle on the volar side.  Given this, I think it is best we proceed with MRI of the wrist to evaluate for occult fracture as well as evaluate the SL ligament.  She does have STT arthritic change so possible she had an aggravation of this versus a wrist sprain, however MRI will tease out any underlying pathology.  We will keep her in the thumb spica brace until that time and she will follow-up after MRI to discuss neck steps.  Recommended ice.  May take Tylenol as  needed.  Follow-up: Return for Follow-up with Dr. Rolena Infante 3 business days after getting MRI.   Meds & Orders: No orders of the defined types were placed in this encounter.   Orders Placed This Encounter  Procedures   XR Wrist Complete Left   MR Wrist Left w/o contrast     Procedures: No procedures performed      Clinical History: No specialty comments available.  She reports that she has never smoked. She has never used smokeless tobacco. No results for input(s): "HGBA1C", "LABURIC" in the last 8760 hours.  Objective:   Vital Signs: There were no vitals taken for this visit.  Physical Exam  Gen: Well-appearing, in no acute distress; non-toxic CV: Well-perfused. Warm.  Resp: Breathing unlabored on room air; no wheezing. Psych: Fluid speech in conversation; appropriate affect; normal thought process Neuro: Sensation intact throughout. No gross coordination deficits.   Ortho Exam -Examination of the left wrist show some dorsal sided swelling.  There is positive TTP within the scaphoid tubercle, radiocarpal and in the radial aspect of the distal radius.  Imaging:  XR Wrist Complete Left  Result Date: 11/18/2021 4 views of the left wrist including AP, oblique, lateral and ulnar deviated views were ordered and reviewed by myself.  X-rays show notable sclerosis between the ST-junction with arthritic change here as well as the [facility] joint  of the thumb.  Small cortical defect of the edge of the radial styloid on lateral view for possible artifact versus occult nondisplaced fracture.  Slightly negative ulnar variance.  No evidence of clear fracture.   Past Medical/Family/Surgical/Social History: Medications & Allergies reviewed per EMR, new medications updated. Patient Active Problem List   Diagnosis Date Noted   Multiple lacunar infarcts ([place]) 02/21/2021   Atherosclerosis of both carotid arteries 02/21/2021   BMI 29.0-29.9,adult 02/21/2021   Pure hypercholesterolemia 11/02/2017    Dermatitis 11/02/2017   Decreased estrogen level 10/08/2017   Essential hypertension 10/08/2017   Dysphagia 10/08/2017   Multiple lung nodules 11/28/2011   Halitosis 11/28/2011   Past Medical History:  Diagnosis Date   Breast lump    Colon polyps    Fibroids    GERD (gastroesophageal reflux disease)    Glaucoma    Hyperlipidemia 1986   Hypertension 1986   Interstitial cystitis 1999   Kidney stones 2005   Osteoporosis    Tonsillitis    Family History  Problem Relation Age of Onset   Coronary artery disease Father        mother, PGM   Stroke Father    Hypertension Father    Colon cancer Father 1   Hypertension Mother    Heart disease Mother    Colon cancer Mother 7   Diabetes Other    Colon cancer Maternal Grandmother    Breast cancer Neg Hx    Past Surgical History:  Procedure Laterality Date   BREAST EXCISIONAL BIOPSY Right 07/27/2006   Intraductal Papilloma   CATARACT EXTRACTION Bilateral 02/2020   other in 05/2020   COLONOSCOPY  2010   Dr Collene Mares   HYSTEROSCOPY  1993   LAPAROSCOPIC HYSTERECTOMY  1994   MYOMECTOMY  1993   excision of tumor   [place]   Social History   Occupational History   Occupation: retired  Tobacco Use   Smoking status: Never   Smokeless tobacco: Never  Vaping Use   Vaping Use: Never used  Substance and Sexual Activity   Alcohol use: Yes    Alcohol/week: 12.0 standard drinks of alcohol    Types: 6 Standard drinks or equivalent, 6 Glasses of wine per week   Drug use: No   Sexual activity: Not Currently

## 2021-12-12 ENCOUNTER — Ambulatory Visit
Admission: RE | Admit: 2021-12-12 | Discharge: 2021-12-12 | Disposition: A | Discharge status: Home / Self Care | Payer: Medicare Other | Source: Ambulatory Visit | Attending: Sports Medicine | Admitting: Sports Medicine

## 2021-12-12 DIAGNOSIS — M65842 Other synovitis and tenosynovitis, left hand: Secondary | ICD-10-CM | POA: Diagnosis not present

## 2021-12-12 DIAGNOSIS — M25532 Pain in left wrist: Secondary | ICD-10-CM

## 2021-12-12 DIAGNOSIS — M19032 Primary osteoarthritis, left wrist: Secondary | ICD-10-CM | POA: Diagnosis not present

## 2021-12-12 DIAGNOSIS — S52515A Nondisplaced fracture of left radial styloid process, initial encounter for closed fracture: Secondary | ICD-10-CM | POA: Diagnosis not present

## 2021-12-16 ENCOUNTER — Encounter: Payer: Self-pay | Admitting: Sports Medicine

## 2021-12-16 ENCOUNTER — Ambulatory Visit (INDEPENDENT_AMBULATORY_CARE_PROVIDER_SITE_OTHER): Payer: Medicare Other | Admitting: Sports Medicine

## 2021-12-16 VITALS — BP 134/79 | HR 63

## 2021-12-16 DIAGNOSIS — S52515A Nondisplaced fracture of left radial styloid process, initial encounter for closed fracture: Secondary | ICD-10-CM

## 2021-12-16 NOTE — Progress Notes (Signed)
Marissa Orozco - 76 y.o. female MRN [id number]  Date of birth: [birth year]  Office Visit Note: Visit Date: 12/16/2021 PCP: Glendale Chard, MD Referred by: Glendale Chard, MD  Subjective: Chief Complaint  Patient presents with   Left Wrist - Pain, Follow-up   HPI: Marissa Orozco is a pleasant 76 y.o. female who presents today for left distal radius fracture.  About 1 month ago she fell and suffered a [place] injury to the left wrist.  X-rays at the [place] ED were negative for fracture as well as my follow-up x-rays, however she continued with distal radial pain.  At her last visit we did order an MRI which did reveal an occult nondisplaced fracture of the distal radial metaphysis at the base of the styloid.  She also had some inflammation of the extensor tendons.  She has been managed in the thumb spica brace, takes this off only to shower.  Her pain is still present, but improving.  No new injury.  Pertinent ROS were reviewed with the patient and found to be negative unless otherwise specified above in HPI.   Assessment & Plan: Visit Diagnoses:  1. Closed nondisplaced fracture of styloid process of left radius, initial encounter    Plan: Discussed with Marissa Orozco and did review her MRI which did reveal a radial styloid fracture, nondisplaced.  She is about 1 month out from her initial injury, but only has been immobilized in a thumb spica brace.  Given the improvement in her pain and this being nondisplaced, we will transition her to an Exos short arm cast - this was molded and fitted on her today.  She will remain in this at all times, may take this off to shower.  I would like to see her back in just over 1 week for repeat x-rays to ensure fracture is maintained alignment and nondisplacement.  Ultimately, we will likely proceed with the Exos brace for about 3 weeks total before repeat x-rays to show signs of healing.  May take Tylenol, avoid continuous NSAIDs.  Follow-up: Return in  about 1 week (around 12/23/2021) for with Dr. Rolena Infante for repeat xrays next Wed - Friday.   Meds & Orders: No orders of the defined types were placed in this encounter.  No orders of the defined types were placed in this encounter.    Procedures: No procedures performed      Clinical History: No specialty comments available.  She reports that she has never smoked. She has never used smokeless tobacco. No results for input(s): "HGBA1C", "LABURIC" in the last 8760 hours.  Objective:   Vital Signs: BP 134/79 (BP Location: Left Arm, Patient Position: Sitting, Cuff Size: Normal)   Pulse 63   Physical Exam  Gen: Well-appearing, in no acute distress; non-toxic CV: Regular Rate. Well-perfused. Warm.  Resp: Breathing unlabored on room air; no wheezing. Psych: Fluid speech in conversation; appropriate affect; normal thought process Neuro: Sensation intact throughout. No gross coordination deficits.   Ortho Exam -Left wrist: There is improvement in swelling, no ecchymosis.  There is some mild TTP over the radial styloid, no TTP of the carpal bones or ulna.  There are no restrictions in range of motion although some pain with full range extension and radial deviation.  Neurovascularly intact distally.  Imaging:  MR Wrist Left w/o contrast CLINICAL DATA:  Wrist pain, persistent, neg xray left wrist pain  EXAM: MR OF THE LEFT WRIST WITHOUT CONTRAST  TECHNIQUE: Multiplanar, multisequence MR imaging of the left wrist  was performed. No intravenous contrast was administered.  COMPARISON:  Radiograph 11/18/2021, 11/12/2021.  FINDINGS: Ligaments: Scapholunate and lunotriquetral ligaments are intact.  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  Tendons: Minimal tenosynovitis of the extensor digitorum tendons and EPL tendon at the level of Lister's tubercle and of the extensor digitorum tendons more distally. The other extensor  tendons are unremarkable. The flexor tendons are unremarkable. Mild tendinosis and tenosynovitis of the abductor pollicis longus and extensor pollicis longus tendons.  Carpal tunnel/median nerve: Flexor retinaculum is intact. Normal carpal tunnel without a mass. Median nerve demonstrates normal signal and caliber.  Guyon's canal: Normal Guyon's canal. Normal ulnar nerve.  Joint/cartilage: Mild radiocarpal, intercarpal, first CMC, and second CMC osteoarthritis.  Bones/carpal alignment: There is a nondisplaced fracture of the distal radial metaphysis at the base of the styloid and extending to the radiocarpal joint surface.  Other: Soft tissue swelling of the wrist. No focal fluid collection.  IMPRESSION: Acute nondisplaced fracture of the distal radial metaphysis at the base of the styloid and extending to the radiocarpal joint surface.  Mild radiocarpal, intercarpal, first CMC, and second CMC osteoarthritis.  Minimal tenosynovitis of the extensor digitorum and EPL tendons.  Mild tendinosis and tenosynovitis of the abductor pollicis longus and extensor pollicis longus tendons.  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  Electronically Signed   By: Maurine Simmering M.D.   On: 12/14/2021 13:37    Past Medical/Family/Surgical/Social History: Medications & Allergies reviewed per EMR, new medications updated. Patient Active Problem List   Diagnosis Date Noted   Multiple lacunar infarcts ([place]) 02/21/2021   Atherosclerosis of both carotid arteries 02/21/2021   BMI 29.0-29.9,adult 02/21/2021   Pure hypercholesterolemia 11/02/2017   Dermatitis 11/02/2017   Decreased estrogen level 10/08/2017   Essential hypertension 10/08/2017   Dysphagia 10/08/2017   Multiple lung nodules 11/28/2011   Halitosis 11/28/2011   Past Medical History:  Diagnosis Date   Breast lump    Colon polyps    Fibroids    GERD (gastroesophageal reflux disease)     Glaucoma    Hyperlipidemia 1986   Hypertension 1986   Interstitial cystitis 1999   Kidney stones 2005   Osteoporosis    Tonsillitis    Family History  Problem Relation Age of Onset   Coronary artery disease Father        mother, PGM   Stroke Father    Hypertension Father    Colon cancer Father 36   Hypertension Mother    Heart disease Mother    Colon cancer Mother 28   Diabetes Other    Colon cancer Maternal Grandmother    Breast cancer Neg Hx    Past Surgical History:  Procedure Laterality Date   BREAST EXCISIONAL BIOPSY Right 07/27/2006   Intraductal Papilloma   CATARACT EXTRACTION Bilateral 02/2020   other in 05/2020   COLONOSCOPY  2010   Dr Collene Mares   HYSTEROSCOPY  1993   LAPAROSCOPIC HYSTERECTOMY  1994   MYOMECTOMY  1993   excision of tumor   [place]   Social History   Occupational History   Occupation: retired  Tobacco Use   Smoking status: Never   Smokeless tobacco: Never  Vaping Use   Vaping Use: Never used  Substance and Sexual Activity   Alcohol use: Yes    Alcohol/week: 12.0 standard drinks of alcohol    Types: 6 Standard drinks or equivalent, 6 Glasses  of wine per week   Drug use: No   Sexual activity: Not Currently

## 2021-12-23 ENCOUNTER — Ambulatory Visit (INDEPENDENT_AMBULATORY_CARE_PROVIDER_SITE_OTHER): Payer: Medicare Other

## 2021-12-23 ENCOUNTER — Encounter: Payer: Self-pay | Admitting: Sports Medicine

## 2021-12-23 ENCOUNTER — Ambulatory Visit (INDEPENDENT_AMBULATORY_CARE_PROVIDER_SITE_OTHER): Payer: Medicare Other | Admitting: Sports Medicine

## 2021-12-23 DIAGNOSIS — S52515A Nondisplaced fracture of left radial styloid process, initial encounter for closed fracture: Secondary | ICD-10-CM | POA: Diagnosis not present

## 2021-12-23 NOTE — Progress Notes (Signed)
Marissa Orozco - 76 y.o. female MRN [id number]  Date of birth: May 23, 1945  Office Visit Note: Visit Date: 12/23/2021 PCP: Marissa Chard, MD Referred by: Marissa Chard, MD  Subjective: Chief Complaint  Patient presents with   Left Wrist - Fracture   HPI: Marissa Orozco is a pleasant 76 y.o. female who presents today for follow-up of left radial styloid fracture.  She has been doing well in the Exos wrist cast.  Reports her pain continues to improve, extension minimal to none at this time.  She remains in the cast other than taking it off to shower.  She denies any new injury.  Not needing use of medications for analgesic control.  Pertinent ROS were reviewed with the patient and found to be negative unless otherwise specified above in HPI.   Assessment & Plan: Visit Diagnoses:  1. Closed nondisplaced fracture of styloid process of left radius, initial encounter    Plan: Discussed with Marissa Orozco her x-rays and clinical exam does show a healing radial styloid fracture.  I would like her to remain in the Exos wrist cast for 2 additional weeks.  When she comes out of this to shower she will do some gripping activities with a stress ball and very gentle passive stretching in flexion and extension to help prevent stiffness.  She may use Tylenol as needed for any pain control.  Will follow-up in 2 weeks for repeat x-rays and hopefully be able to discontinue the cast at that time and prescribe her range of motion and strengthening exercises for the wrist.  Follow-up: Return in about 2 weeks (around 01/06/2022) for wrist fracture f/u with x-rays.   Meds & Orders: No orders of the defined types were placed in this encounter.   Orders Placed This Encounter  Procedures   XR Wrist Complete Left     Procedures: No procedures performed      Clinical History: No specialty comments available.  She reports that she has never smoked. She has never used smokeless tobacco. No results for  input(s): "HGBA1C", "LABURIC" in the last 8760 hours.  Objective:    Physical Exam  Gen: Well-appearing, in no acute distress; non-toxic CV: Regular Rate. Well-perfused. Warm.  Resp: Breathing unlabored on room air; no wheezing. Psych: Fluid speech in conversation; appropriate affect; normal thought process Neuro: Sensation intact throughout. No gross coordination deficits.   Ortho Exam -Left wrist: Patient was evaluated out of her Exos wrist cast.  No swelling or redness.  There is essentially no bony TTP.  There is very mild restriction in wrist flexion compared to the contralateral wrist.  Wrist extension is essentially the same.  Neurovascular intact distally.  Imaging: XR Wrist Complete Left  Result Date: 12/23/2021 3 views of the left wrist including AP, oblique and lateral films were ordered and reviewed by myself.  There is evidence of a healing radial styloid fracture, nondisplaced.  There is early callus changes.  No dorsal or volar angulation on lateral view.   Past Medical/Family/Surgical/Social History: Medications & Allergies reviewed per EMR, new medications updated. Patient Active Problem List   Diagnosis Date Noted   Multiple lacunar infarcts ([place]) 02/21/2021   Atherosclerosis of both carotid arteries 02/21/2021   BMI 29.0-29.9,adult 02/21/2021   Pure hypercholesterolemia 11/02/2017   Dermatitis 11/02/2017   Decreased estrogen level 10/08/2017   Essential hypertension 10/08/2017   Dysphagia 10/08/2017   Multiple lung nodules 11/28/2011   Halitosis 11/28/2011   Past Medical History:  Diagnosis Date   Breast  lump    Colon polyps    Fibroids    GERD (gastroesophageal reflux disease)    Glaucoma    Hyperlipidemia 1986   Hypertension 1986   Interstitial cystitis 1999   Kidney stones 2005   Osteoporosis    Tonsillitis    Family History  Problem Relation Age of Onset   Coronary artery disease Father        mother, PGM   Stroke Father    Hypertension  Father    Colon cancer Father 57   Hypertension Mother    Heart disease Mother    Colon cancer Mother 63   Diabetes Other    Colon cancer Maternal Grandmother    Breast cancer Neg Hx    Past Surgical History:  Procedure Laterality Date   BREAST EXCISIONAL BIOPSY Right 07/27/2006   Intraductal Papilloma   CATARACT EXTRACTION Bilateral 02/2020   other in 05/2020   COLONOSCOPY  2010   Dr Collene Mares   HYSTEROSCOPY  1993   LAPAROSCOPIC HYSTERECTOMY  1994   MYOMECTOMY  1993   excision of tumor   Marissa Orozco   Social History   Occupational History   Occupation: retired  Tobacco Use   Smoking status: Never   Smokeless tobacco: Never  Vaping Use   Vaping Use: Never used  Substance and Sexual Activity   Alcohol use: Yes    Alcohol/week: 12.0 standard drinks of alcohol    Types: 6 Standard drinks or equivalent, 6 Glasses of wine per week   Drug use: No   Sexual activity: Not Currently

## 2022-01-06 ENCOUNTER — Encounter: Payer: Self-pay | Admitting: Sports Medicine

## 2022-01-06 ENCOUNTER — Ambulatory Visit (INDEPENDENT_AMBULATORY_CARE_PROVIDER_SITE_OTHER): Payer: Medicare Other

## 2022-01-06 ENCOUNTER — Ambulatory Visit (INDEPENDENT_AMBULATORY_CARE_PROVIDER_SITE_OTHER): Payer: Medicare Other | Admitting: Sports Medicine

## 2022-01-06 DIAGNOSIS — S52515D Nondisplaced fracture of left radial styloid process, subsequent encounter for closed fracture with routine healing: Secondary | ICD-10-CM | POA: Diagnosis not present

## 2022-01-06 DIAGNOSIS — S52515A Nondisplaced fracture of left radial styloid process, initial encounter for closed fracture: Secondary | ICD-10-CM | POA: Diagnosis not present

## 2022-01-06 NOTE — Progress Notes (Signed)
Doing good; having no pain In the exos splint with no complaints today  Patient was instructed in 10 minutes of therapeutic exercises for the left wrist to improve strength, ROM and function according to my instructions and plan of care by a Certified Athletic Trainer during the office visit. A customized handout was provided and demonstration of proper technique shown and discussed. Patient did perform exercises and demonstrate understanding through teachback.  All questions discussed and answered.

## 2022-01-06 NOTE — Progress Notes (Signed)
Marissa Orozco - 77 y.o. female MRN [id number]  Date of birth: 22-Nov-1945  Office Visit Note: Visit Date: 01/06/2022 PCP: Glendale Chard, MD Referred by: Glendale Chard, MD  Subjective: Chief Complaint  Patient presents with   Left Wrist - Follow-up, Fracture   HPI: Marissa Orozco is a pleasant 77 y.o. female who presents today for follow-up of left radial styloid fracture.  DOI: 11/12/21  She has been doing well in the Exos wrist cast.  She has no pain at rest.  She will get some mild pain due to stiffness and trying to flex the wrist, more so than extend the wrist.  He is not taking any medication for analgesic control.  No new injury.   Pertinent ROS were reviewed with the patient and found to be negative unless otherwise specified above in HPI.   Assessment & Plan: Visit Diagnoses:  1. Closed nondisplaced fracture of styloid process of left radius with routine healing, subsequent encounter    Plan: Discussed with Marissa Orozco her healed radial styloid fracture.  She has been in the Exos wrist cast for about 3.5 weeks now.  At this point, she can transition out of the cast and any bracing as she has both radiographic and clinical evidence of a healed fracture.  She does have some stiffness about the wrist as to be expected, worse with flexion.  I did review stretching and rehab exercises to improve range of motion and strength about the wrist with her in the room.  My athletic trainer also went through a printed handout we provided for her.  Did discuss that we will likely take about 6-8 weeks of home stretching and therapy until this is equivocal to the other side.  She will follow-up at that time if she has any residual pain or issues with the rest, otherwise may follow-up as needed.  May use topical Voltaren gel for any residual pain.  Follow-up: Return if symptoms worsen or fail to improve.   Meds & Orders: No orders of the defined types were placed in this encounter.   Orders  Placed This Encounter  Procedures   XR Wrist Complete Left     Procedures: No procedures performed      Clinical History: No specialty comments available.  She reports that she has never smoked. She has never used smokeless tobacco. No results for input(s): "HGBA1C", "LABURIC" in the last 8760 hours.  Objective:   Vital Signs: There were no vitals taken for this visit.  Physical Exam  Gen: Well-appearing, in no acute distress; non-toxic CV: Regular Rate. Well-perfused. Warm.  Resp: Breathing unlabored on room air; no wheezing. Psych: Fluid speech in conversation; appropriate affect; normal thought process Neuro: Sensation intact throughout. No gross coordination deficits.   Ortho Exam - Left wrist: There is no swelling, redness or bruising.  There is no bony TTP.  There is some mild restriction in wrist flexion about 15 degrees less than the contralateral side, wrist extension is essentially the same, about 5-8 degrees less than the contralateral side.  Neurovascular intact distally.  There are some mild wrist atrophy of the wrist extension muscles. 5/5 grip strength.   Imaging: XR Wrist Complete Left  Result Date: 01/06/2022 3 views of the left wrist including AP, oblique and lateral films were ordered and reviewed by myself.  There is evidence of a healed radial styloid fracture.  There is overlying callus change, no dorsal or volar angulation on lateral view.   Past Medical/Family/Surgical/Social History:  Medications & Allergies reviewed per EMR, new medications updated. Patient Active Problem List   Diagnosis Date Noted   Multiple lacunar infarcts ([place]) 02/21/2021   Atherosclerosis of both carotid arteries 02/21/2021   BMI 29.0-29.9,adult 02/21/2021   Pure hypercholesterolemia 11/02/2017   Dermatitis 11/02/2017   Decreased estrogen level 10/08/2017   Essential hypertension 10/08/2017   Dysphagia 10/08/2017   Multiple lung nodules 11/28/2011   Halitosis 11/28/2011    Past Medical History:  Diagnosis Date   Breast lump    Colon polyps    Fibroids    GERD (gastroesophageal reflux disease)    Glaucoma    Hyperlipidemia 1986   Hypertension 1986   Interstitial cystitis 1999   Kidney stones 2005   Osteoporosis    Tonsillitis    Family History  Problem Relation Age of Onset   Coronary artery disease Father        mother, PGM   Stroke Father    Hypertension Father    Colon cancer Father 52   Hypertension Mother    Heart disease Mother    Colon cancer Mother 62   Diabetes Other    Colon cancer Maternal Grandmother    Breast cancer Neg Hx    Past Surgical History:  Procedure Laterality Date   BREAST EXCISIONAL BIOPSY Right 07/27/2006   Intraductal Papilloma   CATARACT EXTRACTION Bilateral 02/2020   other in 05/2020   COLONOSCOPY  2010   Dr Collene Mares   HYSTEROSCOPY  1993   LAPAROSCOPIC HYSTERECTOMY  1994   MYOMECTOMY  1993   excision of tumor   [place]   Social History   Occupational History   Occupation: retired  Tobacco Use   Smoking status: Never   Smokeless tobacco: Never  Vaping Use   Vaping Use: Never used  Substance and Sexual Activity   Alcohol use: Yes    Alcohol/week: 12.0 standard drinks of alcohol    Types: 6 Standard drinks or equivalent, 6 Glasses of wine per week   Drug use: No   Sexual activity: Not Currently

## 2022-01-13 ENCOUNTER — Other Ambulatory Visit: Payer: Self-pay

## 2022-01-13 ENCOUNTER — Encounter: Payer: Self-pay | Admitting: Internal Medicine

## 2022-01-13 MED ORDER — EZETIMIBE 10 MG PO TABS
10.0000 mg | ORAL_TABLET | Freq: Every day | ORAL | 2 refills | Status: DC
Start: 2022-01-13 — End: 2022-02-09

## 2022-01-13 MED ORDER — ATORVASTATIN CALCIUM 80 MG PO TABS: 80.0000 mg | ORAL_TABLET | Freq: Every day | ORAL | 1 refills | Status: DC

## 2022-02-09 ENCOUNTER — Encounter: Payer: Self-pay | Admitting: Internal Medicine

## 2022-02-09 ENCOUNTER — Ambulatory Visit (INDEPENDENT_AMBULATORY_CARE_PROVIDER_SITE_OTHER): Payer: Medicare Other | Admitting: Internal Medicine

## 2022-02-09 VITALS — BP 126/78 | HR 69 | Temp 98.2°F | Ht 62.0 in | Wt 166.4 lb

## 2022-02-09 DIAGNOSIS — R7309 Other abnormal glucose: Secondary | ICD-10-CM | POA: Diagnosis not present

## 2022-02-09 DIAGNOSIS — Z6830 Body mass index (BMI) 30.0-30.9, adult: Secondary | ICD-10-CM

## 2022-02-09 DIAGNOSIS — I10 Essential (primary) hypertension: Secondary | ICD-10-CM | POA: Diagnosis not present

## 2022-02-09 DIAGNOSIS — Z23 Encounter for immunization: Secondary | ICD-10-CM

## 2022-02-09 DIAGNOSIS — E7800 Pure hypercholesterolemia, unspecified: Secondary | ICD-10-CM | POA: Diagnosis not present

## 2022-02-09 DIAGNOSIS — E6609 Other obesity due to excess calories: Secondary | ICD-10-CM | POA: Diagnosis not present

## 2022-02-09 MED ORDER — ASPIRIN 81 MG PO TBEC: 81.0000 mg | DELAYED_RELEASE_TABLET | Freq: Every day | ORAL | 2 refills | Status: AC

## 2022-02-09 MED ORDER — EZETIMIBE 10 MG PO TABS: 10.0000 mg | ORAL_TABLET | Freq: Every day | ORAL | 2 refills | Status: DC

## 2022-02-09 NOTE — Patient Instructions (Signed)
Hypertension, Adult Hypertension is another name for high blood pressure. High blood pressure forces your heart to work harder to pump blood. This can cause problems over time. There are two numbers in a blood pressure reading. There is a top number (systolic) over a bottom number (diastolic). It is best to have a blood pressure that is below 120/80. What are the causes? The cause of this condition is not known. Some other conditions can lead to high blood pressure. What increases the risk? Some lifestyle factors can make you more likely to develop high blood pressure: Smoking. Not getting enough exercise or physical activity. Being overweight. Having too much fat, sugar, calories, or salt (sodium) in your diet. Drinking too much alcohol. Other risk factors include: Having any of these conditions: Heart disease. Diabetes. High cholesterol. Kidney disease. Obstructive sleep apnea. Having a family history of high blood pressure and high cholesterol. Age. The risk increases with age. Stress. What are the signs or symptoms? High blood pressure may not cause symptoms. Very high blood pressure (hypertensive crisis) may cause: Headache. Fast or uneven heartbeats (palpitations). Shortness of breath. Nosebleed. Vomiting or feeling like you may vomit (nauseous). Changes in how you see. Very bad chest pain. Feeling dizzy. Seizures. How is this treated? This condition is treated by making healthy lifestyle changes, such as: Eating healthy foods. Exercising more. Drinking less alcohol. Your doctor may prescribe medicine if lifestyle changes do not help enough and if: Your top number is above 130. Your bottom number is above 80. Your personal target blood pressure may vary. Follow these instructions at home: Eating and drinking  If told, follow the DASH eating plan. To follow this plan: Fill one half of your plate at each meal with fruits and vegetables. Fill one fourth of your plate  at each meal with whole grains. Whole grains include whole-wheat pasta, brown rice, and whole-grain bread. Eat or drink low-fat dairy products, such as skim milk or low-fat yogurt. Fill one fourth of your plate at each meal with low-fat (lean) proteins. Low-fat proteins include fish, chicken without skin, eggs, beans, and tofu. Avoid fatty meat, cured and processed meat, or chicken with skin. Avoid pre-made or processed food. Limit the amount of salt in your diet to less than 1,500 mg each day. Do not drink alcohol if: Your doctor tells you not to drink. You are pregnant, may be pregnant, or are planning to become pregnant. If you drink alcohol: Limit how much you have to: 0-1 drink a day for women. 0-2 drinks a day for men. Know how much alcohol is in your drink. In the [place] one drink equals one 12 oz bottle of beer (355 mL), one 5 oz glass of wine (148 mL), or one 1 oz glass of hard liquor (44 mL). Lifestyle  Work with your doctor to stay at a healthy weight or to lose weight. Ask your doctor what the best weight is for you. Get at least 30 minutes of exercise that causes your heart to beat faster (aerobic exercise) most days of the week. This may include walking, swimming, or biking. Get at least 30 minutes of exercise that strengthens your muscles (resistance exercise) at least 3 days a week. This may include lifting weights or doing Pilates. Do not smoke or use any products that contain nicotine or tobacco. If you need help quitting, ask your doctor. Check your blood pressure at home as told by your doctor. Keep all follow-up visits. Medicines Take over-the-counter and prescription medicines  only as told by your doctor. Follow directions carefully. ?Do not skip doses of blood pressure medicine. The medicine does not work as well if you skip doses. Skipping doses also puts you at risk for problems. ?Ask your doctor about side effects or reactions to medicines that you should watch  for. ?Contact a doctor if: ?You think you are having a reaction to the medicine you are taking. ?You have headaches that keep coming back. ?You feel dizzy. ?You have swelling in your ankles. ?You have trouble with your vision. ?Get help right away if: ?You get a very bad headache. ?You start to feel mixed up (confused). ?You feel weak or numb. ?You feel faint. ?You have very bad pain in your: ?Chest. ?Belly (abdomen). ?You vomit more than once. ?You have trouble breathing. ?These symptoms may be an emergency. Get help right away. Call 911. ?Do not wait to see if the symptoms will go away. ?Do not drive yourself to the hospital. ?Summary ?Hypertension is another name for high blood pressure. ?High blood pressure forces your heart to work harder to pump blood. ?For most people, a normal blood pressure is less than 120/80. ?Making healthy choices can help lower blood pressure. If your blood pressure does not get lower with healthy choices, you may need to take medicine. ?This information is not intended to replace advice given to you by your health care provider. Make sure you discuss any questions you have with your health care provider. ?Document Revised: 10/09/2020 Document Reviewed: 10/09/2020 ?Elsevier Patient Education ? 2023 Elsevier Inc. ? ?

## 2022-02-09 NOTE — Progress Notes (Unsigned)
I,Victoria T Hamilton,acting as a scribe for Maximino Greenland, MD.,have documented all relevant documentation on the behalf of Maximino Greenland, MD,as directed by  Maximino Greenland, MD while in the presence of Maximino Greenland, MD.    Subjective:     Patient ID: Marissa Orozco , female    DOB: 20-May-1945 , 77 y.o.   MRN: [id number]   Chief Complaint  Patient presents with   Hypertension   Hyperlipidemia    HPI  The patient is here for a follow-up on her blood pressure today. She reports compliance with meds. Denies headaches, chest pain and shortness of breath. She has brought in her BP monitor. Monitor read: 149/92 this morning she took bp before coming in, reading read: 115/81. Patient reports purchasing machine 3 weeks ago.   Hypertension This is a chronic problem. The current episode started more than 1 year ago. The problem is controlled. Pertinent negatives include no blurred vision, chest pain, palpitations or shortness of breath. Risk factors for coronary artery disease include dyslipidemia and post-menopausal state. Past treatments include ACE inhibitors. The current treatment provides moderate improvement.     Past Medical History:  Diagnosis Date   Breast lump    Colon polyps    Fibroids    GERD (gastroesophageal reflux disease)    Glaucoma    Hyperlipidemia 1986   Hypertension 1986   Interstitial cystitis 1999   Kidney stones 2005   Osteoporosis    Tonsillitis      Family History  Problem Relation Age of Onset   Coronary artery disease Father        mother, PGM   Stroke Father    Hypertension Father    Colon cancer Father 14   Hypertension Mother    Heart disease Mother    Colon cancer Mother 89   Diabetes Other    Colon cancer Maternal Grandmother    Breast cancer Neg Hx      Current Outpatient Medications:    amLODipine (NORVASC) 5 MG tablet, Take 1 tablet (5 mg total) by mouth daily., Disp: 90 tablet, Rfl: 2   aspirin EC 81 MG tablet, Take 1 tablet  (81 mg total) by mouth daily. Swallow whole., Disp: 150 tablet, Rfl: 2   atorvastatin (LIPITOR) 80 MG tablet, Take 1 tablet (80 mg total) by mouth daily., Disp: 90 tablet, Rfl: 1   CALCIUM PO, Take 1 tablet by mouth daily. 500 mg daily, Disp: , Rfl:    Cholecalciferol (VITAMIN D PO), Take 1 tablet by mouth daily. 2000 units daily, Disp: , Rfl:    dorzolamide-timolol (COSOPT) 22.3-6.8 MG/ML ophthalmic solution, , Disp: , Rfl:    finasteride (PROSCAR) 5 MG tablet, Take 5 mg by mouth daily., Disp: , Rfl:    latanoprost (XALATAN) 0.005 % ophthalmic solution, Place 1 drop into both eyes daily., Disp: , Rfl:    ezetimibe (ZETIA) 10 MG tablet, Take 1 tablet (10 mg total) by mouth daily., Disp: 90 tablet, Rfl: 2  Current Facility-Administered Medications:    ezetimibe (ZETIA) tablet 10 mg, 10 mg, Oral, Daily, Glendale Chard, MD   Allergies  Allergen Reactions   Hctz [Hydrochlorothiazide] Itching     Review of Systems  Constitutional: Negative.   Eyes:  Negative for blurred vision.  Respiratory: Negative.  Negative for shortness of breath.   Cardiovascular: Negative.  Negative for chest pain and palpitations.  Neurological: Negative.   Psychiatric/Behavioral: Negative.       Today's Vitals   02/09/22  0831  BP: 126/78  Pulse: 69  Temp: 98.2 F (36.8 C)  SpO2: 98%  Weight: 166 lb 6.4 oz (75.5 kg)  Height: '5\' 2"'$  (1.575 m)   Body mass index is 30.43 kg/m.  Wt Readings from Last 3 Encounters:  02/09/22 166 lb 6.4 oz (75.5 kg)  09/03/21 166 lb (75.3 kg)  09/03/21 166 lb 9.6 oz (75.6 kg)    Objective:  Physical Exam Vitals and nursing note reviewed.  Constitutional:      Appearance: Normal appearance.  HENT:     Head: Normocephalic and atraumatic.  Eyes:     Extraocular Movements: Extraocular movements intact.  Cardiovascular:     Rate and Rhythm: Normal rate and regular rhythm.     Heart sounds: Normal heart sounds.  Pulmonary:     Effort: Pulmonary effort is normal.      Breath sounds: Normal breath sounds.  Musculoskeletal:     Cervical back: Normal range of motion.  Skin:    General: Skin is warm.  Neurological:     General: No focal deficit present.     Mental Status: She is alert.  Psychiatric:        Mood and Affect: Mood normal.        Behavior: Behavior normal.         Assessment And Plan:     1. HTN (hypertension), malignant Comments: Chronic, controlled. She will c/w amlodipine '5mg'$  and lisinopril . She is encouraged to follow low sodium diet. - CMP14+EGFR - CBC  2. Pure hypercholesterolemia Comments: Chronic, goal LDL < 70. She will c/w atorvastatin daily. She is encouraged to follow heart healthy lifestyle. - CMP14+EGFR - CBC  3. Other abnormal glucose Comments: Previous labs reviewed, a1c has been elevated in the past. I will recheck this today. She is encouraged to limit her intake of sugary beverages and foods. - Hemoglobin A1c  4. Class 1 obesity due to excess calories with serious comorbidity and body mass index (BMI) of 30.0 to 30.9 in adult Comments: She is encouraged to aim for at least 150 minutes of exercise per week.  5. Need for shingles vaccine Comments: She was given Shingrix IM x 1, billed via [facility].  This completes her series. - Zoster Recombinant (Shingrix )    Patient was given opportunity to ask questions. Patient verbalized understanding of the plan and was able to repeat key elements of the plan. All questions were answered to their satisfaction.   I, Maximino Greenland, MD, have reviewed all documentation for this visit. The documentation on 02/09/22 for the exam, diagnosis, procedures, and orders are all accurate and complete.   IF YOU HAVE BEEN REFERRED TO A SPECIALIST, IT MAY TAKE 1-2 WEEKS TO SCHEDULE/PROCESS THE REFERRAL. IF YOU HAVE NOT HEARD FROM US/SPECIALIST IN TWO WEEKS, PLEASE GIVE [place] A CALL AT [phone].   THE PATIENT IS ENCOURAGED TO PRACTICE SOCIAL DISTANCING DUE TO THE COVID-19  PANDEMIC.

## 2022-02-10 LAB — CMP14+EGFR
ALT: 51 IU/L — ABNORMAL HIGH (ref 0–32)
AST: 37 IU/L (ref 0–40)
Albumin/Globulin Ratio: 1.8 (ref 1.2–2.2)
Albumin: 4.2 g/dL (ref 3.8–4.8)
Alkaline Phosphatase: 99 IU/L (ref 44–121)
BUN/Creatinine Ratio: 19 (ref 12–28)
BUN: 16 mg/dL (ref 8–27)
Bilirubin Total: 1.2 mg/dL (ref 0.0–1.2)
CO2: 20 mmol/L (ref 20–29)
Calcium: 10 mg/dL (ref 8.7–10.3)
Chloride: 106 mmol/L (ref 96–106)
Creatinine, Ser: 0.83 mg/dL (ref 0.57–1.00)
Globulin, Total: 2.4 g/dL (ref 1.5–4.5)
Glucose: 109 mg/dL — ABNORMAL HIGH (ref 70–99)
Potassium: 4.9 mmol/L (ref 3.5–5.2)
Sodium: 143 mmol/L (ref 134–144)
Total Protein: 6.6 g/dL (ref 6.0–8.5)
eGFR: 72 mL/min/{1.73_m2} (ref >59)

## 2022-02-10 LAB — CBC
Hematocrit: 41.9 % (ref 34.0–46.6)
Hemoglobin: 14.2 g/dL (ref 11.1–15.9)
MCH: 30.5 pg (ref 26.6–33.0)
MCHC: 33.9 g/dL (ref 31.5–35.7)
MCV: 90 fL (ref 79–97)
Platelets: 314 10*3/uL (ref 150–450)
RBC: 4.66 x10E6/uL (ref 3.77–5.28)
RDW: 13.3 % (ref 11.7–15.4)
WBC: 7.6 10*3/uL (ref 3.4–10.8)

## 2022-02-10 LAB — HEMOGLOBIN A1C
Est. average glucose Bld gHb Est-mCnc: 131 mg/dL
Hgb A1c MFr Bld: 6.2 % — ABNORMAL HIGH (ref 4.8–5.6)

## 2022-02-20 ENCOUNTER — Encounter: Payer: Self-pay | Admitting: Internal Medicine

## 2022-03-01 ENCOUNTER — Other Ambulatory Visit: Payer: Self-pay

## 2022-03-02 ENCOUNTER — Other Ambulatory Visit: Payer: Self-pay

## 2022-03-02 MED ORDER — LISINOPRIL 20 MG PO TABS: 20.0000 mg | ORAL_TABLET | Freq: Every day | ORAL | 1 refills | Status: DC

## 2022-03-16 ENCOUNTER — Other Ambulatory Visit: Payer: Self-pay | Admitting: Internal Medicine

## 2022-03-16 ENCOUNTER — Encounter: Payer: Self-pay | Admitting: Internal Medicine

## 2022-03-16 ENCOUNTER — Other Ambulatory Visit: Payer: Self-pay

## 2022-03-16 DIAGNOSIS — Z1231 Encounter for screening mammogram for malignant neoplasm of breast: Secondary | ICD-10-CM

## 2022-03-16 MED ORDER — EZETIMIBE 10 MG PO TABS: 10.0000 mg | ORAL_TABLET | Freq: Every day | ORAL | 3 refills | Status: DC

## 2022-03-16 MED ORDER — AMLODIPINE BESYLATE 5 MG PO TABS: 5.0000 mg | ORAL_TABLET | Freq: Every day | ORAL | 2 refills | Status: DC

## 2022-03-22 ENCOUNTER — Other Ambulatory Visit: Payer: Self-pay

## 2022-03-22 MED ORDER — EZETIMIBE 10 MG PO TABS: 10.0000 mg | ORAL_TABLET | Freq: Every day | ORAL | 3 refills | Status: DC

## 2022-04-13 DIAGNOSIS — H5340 Unspecified visual field defects: Secondary | ICD-10-CM | POA: Diagnosis not present

## 2022-04-13 DIAGNOSIS — H04123 Dry eye syndrome of bilateral lacrimal glands: Secondary | ICD-10-CM | POA: Diagnosis not present

## 2022-04-13 DIAGNOSIS — H10413 Chronic giant papillary conjunctivitis, bilateral: Secondary | ICD-10-CM | POA: Diagnosis not present

## 2022-04-13 DIAGNOSIS — H401131 Primary open-angle glaucoma, bilateral, mild stage: Secondary | ICD-10-CM | POA: Diagnosis not present

## 2022-05-04 ENCOUNTER — Ambulatory Visit
Admission: RE | Admit: 2022-05-04 | Discharge: 2022-05-04 | Disposition: A | Discharge status: Home / Self Care | Payer: Medicare Other | Source: Ambulatory Visit | Attending: Internal Medicine | Admitting: Internal Medicine

## 2022-05-04 DIAGNOSIS — Z1231 Encounter for screening mammogram for malignant neoplasm of breast: Secondary | ICD-10-CM | POA: Diagnosis not present

## 2022-06-15 ENCOUNTER — Ambulatory Visit (INDEPENDENT_AMBULATORY_CARE_PROVIDER_SITE_OTHER): Payer: Medicare Other | Admitting: Internal Medicine

## 2022-06-15 ENCOUNTER — Encounter: Payer: Self-pay | Admitting: Internal Medicine

## 2022-06-15 VITALS — BP 136/84 | HR 55 | Temp 98.3°F | Ht 62.0 in | Wt 167.2 lb

## 2022-06-15 DIAGNOSIS — I10 Essential (primary) hypertension: Secondary | ICD-10-CM

## 2022-06-15 DIAGNOSIS — Z6830 Body mass index (BMI) 30.0-30.9, adult: Secondary | ICD-10-CM | POA: Diagnosis not present

## 2022-06-15 DIAGNOSIS — Z800 Family history of malignant neoplasm of digestive organs: Secondary | ICD-10-CM | POA: Diagnosis not present

## 2022-06-15 DIAGNOSIS — Z23 Encounter for immunization: Secondary | ICD-10-CM

## 2022-06-15 DIAGNOSIS — R7309 Other abnormal glucose: Secondary | ICD-10-CM | POA: Diagnosis not present

## 2022-06-15 DIAGNOSIS — E7800 Pure hypercholesterolemia, unspecified: Secondary | ICD-10-CM

## 2022-06-15 DIAGNOSIS — E6609 Other obesity due to excess calories: Secondary | ICD-10-CM | POA: Diagnosis not present

## 2022-06-15 NOTE — Progress Notes (Unsigned)
Pura Spice as a Neurosurgeon for Gwynneth Aliment, MD.,have documented all relevant documentation on the behalf of Gwynneth Aliment, MD,as directed by  Gwynneth Aliment, MD while in the presence of Gwynneth Aliment, MD.  Subjective:  Patient ID: Marissa Orozco , female    DOB: 1945/08/29 , 77 y.o.   MRN: [id number]  Chief Complaint  Patient presents with   Hypertension    HPI  The patient is here for a follow-up on her blood pressure today. She reports compliance with meds. Denies headaches, chest pain and shortness of breath. BP reading at home this morning 117/81. She did bring in her log for the past few months, blood pressures range from 107/71 - 140s/80s on her home machine. She feels well, no new concerns.   She acknowledges that she is due for colonoscopy in the near future.   Hypertension This is a chronic problem. The current episode started more than 1 year ago. The problem is controlled. Pertinent negatives include no blurred vision, chest pain, palpitations or shortness of breath. Risk factors for coronary artery disease include dyslipidemia and post-menopausal state. Past treatments include ACE inhibitors. The current treatment provides moderate improvement.     Past Medical History:  Diagnosis Date   Breast lump    Colon polyps    Fibroids    GERD (gastroesophageal reflux disease)    Glaucoma    Hyperlipidemia 1986   Hypertension 1986   Interstitial cystitis 1999   Kidney stones 2005   Osteoporosis    Tonsillitis      Family History  Problem Relation Age of Onset   Coronary artery disease Father        mother, PGM   Stroke Father    Hypertension Father    Colon cancer Father 56   Hypertension Mother    Heart disease Mother    Colon cancer Mother 66   Diabetes Other    Colon cancer Maternal Grandmother    Breast cancer Neg Hx      Current Outpatient Medications:    amLODipine (NORVASC) 5 MG tablet, Take 1 tablet (5 mg total) by mouth daily., Disp:  90 tablet, Rfl: 2   aspirin EC 81 MG tablet, Take 1 tablet (81 mg total) by mouth daily. Swallow whole., Disp: 150 tablet, Rfl: 2   atorvastatin (LIPITOR) 80 MG tablet, Take 1 tablet (80 mg total) by mouth daily., Disp: 90 tablet, Rfl: 1   CALCIUM PO, Take 1 tablet by mouth daily. 500 mg daily, Disp: , Rfl:    Cholecalciferol (VITAMIN D PO), Take 1 tablet by mouth daily. 2000 units daily, Disp: , Rfl:    dorzolamide-timolol (COSOPT) 22.3-6.8 MG/ML ophthalmic solution, , Disp: , Rfl:    ezetimibe (ZETIA) 10 MG tablet, Take 1 tablet (10 mg total) by mouth daily., Disp: 90 tablet, Rfl: 3   finasteride (PROSCAR) 5 MG tablet, Take 5 mg by mouth daily., Disp: , Rfl:    latanoprost (XALATAN) 0.005 % ophthalmic solution, Place 1 drop into both eyes daily., Disp: , Rfl:    lisinopril (ZESTRIL) 20 MG tablet, Take 1 tablet (20 mg total) by mouth daily., Disp: 90 tablet, Rfl: 1  Current Facility-Administered Medications:    ezetimibe (ZETIA) tablet 10 mg, 10 mg, Oral, Daily, Dorothyann Peng, MD   Allergies  Allergen Reactions   Hctz [Hydrochlorothiazide] Itching     Review of Systems  Constitutional: Negative.   Eyes:  Negative for blurred vision.  Respiratory: Negative.  Negative  for shortness of breath.   Cardiovascular: Negative.  Negative for chest pain and palpitations.  Gastrointestinal: Negative.   Musculoskeletal: Negative.   Neurological: Negative.   Psychiatric/Behavioral: Negative.       Today's Vitals   06/15/22 1113  BP: 136/84  Pulse: (!) 55  Temp: 98.3 F (36.8 C)  SpO2: 98%  Weight: 167 lb 3.2 oz (75.8 kg)  Height: 5\' 2"  (1.575 m)   Body mass index is 30.58 kg/m.  Wt Readings from Last 3 Encounters:  06/15/22 167 lb 3.2 oz (75.8 kg)  02/09/22 166 lb 6.4 oz (75.5 kg)  09/03/21 166 lb (75.3 kg)    The 10-year ASCVD risk score (Arnett DK, et al., 2019) is: 16.2%   Values used to calculate the score:     Age: 24 years     Sex: Female     Is Non-Hispanic African  American: Yes     Diabetic: No     Tobacco smoker: No     Systolic Blood Pressure: 136 mmHg     Is BP treated: Yes     HDL Cholesterol: 56 mg/dL     Total Cholesterol: 166 mg/dL  Objective:  Physical Exam Vitals and nursing note reviewed.  Constitutional:      Appearance: Normal appearance.  HENT:     Head: Normocephalic and atraumatic.  Eyes:     Extraocular Movements: Extraocular movements intact.  Cardiovascular:     Rate and Rhythm: Normal rate and regular rhythm.     Heart sounds: Normal heart sounds.  Pulmonary:     Effort: Pulmonary effort is normal.     Breath sounds: Normal breath sounds.  Musculoskeletal:     Cervical back: Normal range of motion.  Skin:    General: Skin is warm.  Neurological:     General: No focal deficit present.     Mental Status: She is alert.  Psychiatric:        Mood and Affect: Mood normal.        Behavior: Behavior normal.          Assessment And Plan:  1. HTN (hypertension), malignant Comments: Chronic, fair control. She will c/w lisinopril 20mg  daily and amlodipine 5mg  daily. Reminded to follow a low sodium diet. - Lipid panel - BMP8+eGFR  2. Pure hypercholesterolemia Comments: Chronic, she will c/w atorvastatin 80mg  and ezetimibe 10mg  daily. She is encouraged to limit her intake of processed meats and fried foods. - Lipid panel  3. Other abnormal glucose Comments: Previous labs reviewed, her a1c has been elevated in the past. I will recheck an a1c today. Advised to limit her intake of sugary beverages/foods. - Hemoglobin A1c  4. Class 1 obesity due to excess calories with serious comorbidity and body mass index (BMI) of 30.0 to 30.9 in adult Comments: Advised to aim for at least 150 minutes of exercise per week.  5. Family history of colon cancer Comments: Last colonoscopy performed in 2019, will refer her to GI for repeat study. - Ambulatory referral to Gastroenterology  6. Immunization due Comments: She was given  Tdap, billed via [facility]. - Tdap vaccine greater than or equal to 7yo IM   Return if symptoms worsen or fail to improve.  Patient was given opportunity to ask questions. Patient verbalized understanding of the plan and was able to repeat key elements of the plan. All questions were answered to their satisfaction.    I, Gwynneth Aliment, MD, have reviewed all documentation for this visit. The  documentation on 06/15/22 for the exam, diagnosis, procedures, and orders are all accurate and complete.   IF YOU HAVE BEEN REFERRED TO A SPECIALIST, IT MAY TAKE 1-2 WEEKS TO SCHEDULE/PROCESS THE REFERRAL. IF YOU HAVE NOT HEARD FROM US/SPECIALIST IN TWO WEEKS, PLEASE GIVE [place] A CALL AT [phone].

## 2022-06-15 NOTE — Patient Instructions (Signed)
Hypertension, Adult Hypertension is another name for high blood pressure. High blood pressure forces your heart to work harder to pump blood. This can cause problems over time. There are two numbers in a blood pressure reading. There is a top number (systolic) over a bottom number (diastolic). It is best to have a blood pressure that is below 120/80. What are the causes? The cause of this condition is not known. Some other conditions can lead to high blood pressure. What increases the risk? Some lifestyle factors can make you more likely to develop high blood pressure: Smoking. Not getting enough exercise or physical activity. Being overweight. Having too much fat, sugar, calories, or salt (sodium) in your diet. Drinking too much alcohol. Other risk factors include: Having any of these conditions: Heart disease. Diabetes. High cholesterol. Kidney disease. Obstructive sleep apnea. Having a family history of high blood pressure and high cholesterol. Age. The risk increases with age. Stress. What are the signs or symptoms? High blood pressure may not cause symptoms. Very high blood pressure (hypertensive crisis) may cause: Headache. Fast or uneven heartbeats (palpitations). Shortness of breath. Nosebleed. Vomiting or feeling like you may vomit (nauseous). Changes in how you see. Very bad chest pain. Feeling dizzy. Seizures. How is this treated? This condition is treated by making healthy lifestyle changes, such as: Eating healthy foods. Exercising more. Drinking less alcohol. Your doctor may prescribe medicine if lifestyle changes do not help enough and if: Your top number is above 130. Your bottom number is above 80. Your personal target blood pressure may vary. Follow these instructions at home: Eating and drinking  If told, follow the DASH eating plan. To follow this plan: Fill one half of your plate at each meal with fruits and vegetables. Fill one fourth of your plate  at each meal with whole grains. Whole grains include whole-wheat pasta, brown rice, and whole-grain bread. Eat or drink low-fat dairy products, such as skim milk or low-fat yogurt. Fill one fourth of your plate at each meal with low-fat (lean) proteins. Low-fat proteins include fish, chicken without skin, eggs, beans, and tofu. Avoid fatty meat, cured and processed meat, or chicken with skin. Avoid pre-made or processed food. Limit the amount of salt in your diet to less than 1,500 mg each day. Do not drink alcohol if: Your doctor tells you not to drink. You are pregnant, may be pregnant, or are planning to become pregnant. If you drink alcohol: Limit how much you have to: 0-1 drink a day for women. 0-2 drinks a day for men. Know how much alcohol is in your drink. In the [place] one drink equals one 12 oz bottle of beer (355 mL), one 5 oz glass of wine (148 mL), or one 1 oz glass of hard liquor (44 mL). Lifestyle  Work with your doctor to stay at a healthy weight or to lose weight. Ask your doctor what the best weight is for you. Get at least 30 minutes of exercise that causes your heart to beat faster (aerobic exercise) most days of the week. This may include walking, swimming, or biking. Get at least 30 minutes of exercise that strengthens your muscles (resistance exercise) at least 3 days a week. This may include lifting weights or doing Pilates. Do not smoke or use any products that contain nicotine or tobacco. If you need help quitting, ask your doctor. Check your blood pressure at home as told by your doctor. Keep all follow-up visits. Medicines Take over-the-counter and prescription medicines   only as told by your doctor. Follow directions carefully. Do not skip doses of blood pressure medicine. The medicine does not work as well if you skip doses. Skipping doses also puts you at risk for problems. Ask your doctor about side effects or reactions to medicines that you should watch  for. Contact a doctor if: You think you are having a reaction to the medicine you are taking. You have headaches that keep coming back. You feel dizzy. You have swelling in your ankles. You have trouble with your vision. Get help right away if: You get a very bad headache. You start to feel mixed up (confused). You feel weak or numb. You feel faint. You have very bad pain in your: Chest. Belly (abdomen). You vomit more than once. You have trouble breathing. These symptoms may be an emergency. Get help right away. Call 911. Do not wait to see if the symptoms will go away. Do not drive yourself to the hospital. Summary Hypertension is another name for high blood pressure. High blood pressure forces your heart to work harder to pump blood. For most people, a normal blood pressure is less than 120/80. Making healthy choices can help lower blood pressure. If your blood pressure does not get lower with healthy choices, you may need to take medicine. This information is not intended to replace advice given to you by your health care provider. Make sure you discuss any questions you have with your health care provider. Document Revised: 10/09/2020 Document Reviewed: 10/09/2020 Elsevier Patient Education  2024 Elsevier Inc.  

## 2022-06-16 LAB — LIPID PANEL
Chol/HDL Ratio: 3 ratio (ref 0.0–4.4)
Cholesterol, Total: 166 mg/dL (ref 100–199)
HDL: 56 mg/dL (ref >39)
LDL Chol Calc (NIH): 92 mg/dL (ref 0–99)
Triglycerides: 96 mg/dL (ref 0–149)
VLDL Cholesterol Cal: 18 mg/dL (ref 5–40)

## 2022-06-16 LAB — BMP8+EGFR
BUN/Creatinine Ratio: 18 (ref 12–28)
BUN: 13 mg/dL (ref 8–27)
CO2: 22 mmol/L (ref 20–29)
Calcium: 10 mg/dL (ref 8.7–10.3)
Chloride: 108 mmol/L — ABNORMAL HIGH (ref 96–106)
Creatinine, Ser: 0.72 mg/dL (ref 0.57–1.00)
Glucose: 93 mg/dL (ref 70–99)
Potassium: 5 mmol/L (ref 3.5–5.2)
Sodium: 144 mmol/L (ref 134–144)
eGFR: 86 mL/min/{1.73_m2} (ref >59)

## 2022-06-16 LAB — HEMOGLOBIN A1C
Est. average glucose Bld gHb Est-mCnc: 134 mg/dL
Hgb A1c MFr Bld: 6.3 % — ABNORMAL HIGH (ref 4.8–5.6)

## 2022-06-17 ENCOUNTER — Encounter: Payer: Self-pay | Admitting: Gastroenterology

## 2022-06-25 ENCOUNTER — Encounter: Payer: Self-pay | Admitting: Gastroenterology

## 2022-07-26 DIAGNOSIS — L649 Androgenic alopecia, unspecified: Secondary | ICD-10-CM | POA: Diagnosis not present

## 2022-07-30 ENCOUNTER — Telehealth: Payer: Self-pay | Admitting: Physician Assistant

## 2022-07-31 ENCOUNTER — Encounter: Payer: Self-pay | Admitting: Internal Medicine

## 2022-08-02 ENCOUNTER — Telehealth (INDEPENDENT_AMBULATORY_CARE_PROVIDER_SITE_OTHER): Payer: Medicare Other | Admitting: Internal Medicine

## 2022-08-02 ENCOUNTER — Encounter: Payer: Self-pay | Admitting: Internal Medicine

## 2022-08-02 VITALS — BP 131/82 | HR 68 | Temp 98.1°F | Ht 62.0 in | Wt 167.0 lb

## 2022-08-02 DIAGNOSIS — E7800 Pure hypercholesterolemia, unspecified: Secondary | ICD-10-CM | POA: Diagnosis not present

## 2022-08-02 DIAGNOSIS — U071 COVID-19: Secondary | ICD-10-CM | POA: Diagnosis not present

## 2022-08-02 MED ORDER — NIRMATRELVIR/RITONAVIR (PAXLOVID)TABLET: 3.0000 | ORAL_TABLET | Freq: Two times a day (BID) | ORAL | 0 refills | Status: AC

## 2022-08-02 NOTE — Assessment & Plan Note (Signed)
She would like treatment, rx paxlovid sent to her pharmacy. Last GFR above 80 in June 2024.  Advised to take full course, possible side effects d/w patient. I will also refer her for home monitoring/temperature monitoring program. She is encouraged to email me daily on Mychart to let me know how she is doing. She is encouraged to go to ER should she develop worsening SOB.  ADVISED TO STOP ATORVASTATIN WHILE ON PAXLOVID. SHE WILL WAIT 12 HOURS AFTER TODAY'S DOSE PRIOR TO STARTING PAXLOVID. SHE WILL HOLD DURING TREATMENT AND HOLD FOR ANOTHER FIVE DAYS.  She verbalized understanding of her treatment plan.  She is also advised to stay well hydrated, move periodically throughout the day and to have a hot beverage daily.  She verbalizes understanding of her treatment plan. All questions were answered to her satisfaction.

## 2022-08-02 NOTE — Patient Instructions (Signed)

## 2022-08-02 NOTE — Assessment & Plan Note (Signed)
Chronic, on statin therapy. SHE WAS ADVISED TO HOLD STATIN FOR TEN DAYS DUE TO CONCOMITANT USE OF PAXLOVID.  She verbalized understanding of her treatment plan. Side effects of worsening myalgias, liver damage and rhabdomyolysis can occur if these meds are taken together.

## 2022-08-02 NOTE — Progress Notes (Signed)
Virtual Visit via Video   This visit type was conducted due to national recommendations for restrictions regarding the COVID-19 Pandemic (e.g. social distancing) in an effort to limit this patient's exposure and mitigate transmission in our community.  Due to her co-morbid illnesses, this patient is at least at moderate risk for complications without adequate follow up.  This format is felt to be most appropriate for this patient at this time.  All issues noted in this document were discussed and addressed.  A limited physical exam was performed with this format.    This visit type was conducted due to national recommendations for restrictions regarding the COVID-19 Pandemic (e.g. social distancing) in an effort to limit this patient's exposure and mitigate transmission in our community.  Patients identity confirmed using two different identifiers.  This format is felt to be most appropriate for this patient at this time.  All issues noted in this document were discussed and addressed.  No physical exam was performed (except for noted visual exam findings with Video Visits).    Date:  08/02/2022   ID:  Marissa Orozco, DOB Mar 10, 1945, MRN [id number]  Patient Location:  Home, husband in the background  Provider location:   Office    Chief Complaint:  "I have COVID and want treatment"  History of Present Illness:    Marissa Orozco is a 77 y.o. female who presents via video conferencing for a telehealth visit today.    The patient does have symptoms concerning for COVID-19 infection (fever, chills, cough, or new shortness of breath).   She presents today for virtual visit. She prefers this method of contact.  She states she developed cough, sneezing and body aches on Thursday evening. She developed fever 100.8 on Friday and decided to check COVID test which was positive. She contacted [facility] and was advised that she would get a rx for paxlovid; however, this was never sent. She  did not contact on call service to get rx. Today, she is feeling much better. Temp is down to 98.1. She is now only experiencing runny nose and congestion. Her husband is also positive. Even though she is feeling better, she would still like to have antiviral treatment.        Past Medical History:  Diagnosis Date   Breast lump    Colon polyps    Fibroids    GERD (gastroesophageal reflux disease)    Glaucoma    Hyperlipidemia 1986   Hypertension 1986   Interstitial cystitis 1999   Kidney stones 2005   Osteoporosis    Tonsillitis    Past Surgical History:  Procedure Laterality Date   BREAST EXCISIONAL BIOPSY Right 07/27/2006   Intraductal Papilloma   CATARACT EXTRACTION Bilateral 02/2020   other in 05/2020   COLONOSCOPY  2010   Dr Loreta Ave   HYSTEROSCOPY  1993   LAPAROSCOPIC HYSTERECTOMY  1994   MYOMECTOMY  1993   excision of tumor   POLYPECTOMY     TONSILLECTOMY  1953     Current Meds  Medication Sig   amLODipine (NORVASC) 5 MG tablet Take 1 tablet (5 mg total) by mouth daily.   aspirin EC 81 MG tablet Take 1 tablet (81 mg total) by mouth daily. Swallow whole.   atorvastatin (LIPITOR) 80 MG tablet Take 1 tablet (80 mg total) by mouth daily.   CALCIUM PO Take 1 tablet by mouth daily. 500 mg daily   Cholecalciferol (VITAMIN D PO) Take 1 tablet by mouth  daily. 2000 units daily   dorzolamide-timolol (COSOPT) 22.3-6.8 MG/ML ophthalmic solution    ezetimibe (ZETIA) 10 MG tablet Take 1 tablet (10 mg total) by mouth daily.   latanoprost (XALATAN) 0.005 % ophthalmic solution Place 1 drop into both eyes daily.   lisinopril (ZESTRIL) 20 MG tablet Take 1 tablet (20 mg total) by mouth daily.   nirmatrelvir/ritonavir (PAXLOVID) 20 x 150 MG & 10 x 100MG  TABS Take 3 tablets by mouth 2 (two) times daily for 5 days. Patient GFR is 86.   Current Facility-Administered Medications for the 08/02/22 encounter (Video Visit) with Dorothyann Peng, MD  Medication   ezetimibe (ZETIA) tablet 10 mg      Allergies:   Hctz [hydrochlorothiazide]   Social History   Tobacco Use   Smoking status: Never   Smokeless tobacco: Never  Vaping Use   Vaping status: Never Used  Substance Use Topics   Alcohol use: Yes    Alcohol/week: 12.0 standard drinks of alcohol    Types: 6 Standard drinks or equivalent, 6 Glasses of wine per week   Drug use: No     Family Hx: The patient's family history includes Colon cancer in her maternal grandmother; Colon cancer (age of onset: 74) in her father; Colon cancer (age of onset: 48) in her mother; Coronary artery disease in her father; Diabetes in an other family member; Heart disease in her mother; Hypertension in her father and mother; Stroke in her father. There is no history of Breast cancer.  ROS:   Please see the history of present illness.    Review of Systems  Constitutional: Negative.   HENT:  Positive for congestion.   Eyes: Negative.   Respiratory:  Positive for cough.   Cardiovascular: Negative.   Gastrointestinal: Negative.   Skin: Negative.   Neurological: Negative.   Psychiatric/Behavioral: Negative.      All other systems reviewed and are negative.   Labs/Other Tests and Data Reviewed:    Recent Labs: 02/09/2022: ALT 51; Hemoglobin 14.2; Platelets 314 06/15/2022: BUN 13; Creatinine, Ser 0.72; Potassium 5.0; Sodium 144   Recent Lipid Panel Lab Results  Component Value Date/Time   CHOL 166 06/15/2022 12:04 PM   TRIG 96 06/15/2022 12:04 PM   HDL 56 06/15/2022 12:04 PM   CHOLHDL 3.0 06/15/2022 12:04 PM   LDLCALC 92 06/15/2022 12:04 PM    Wt Readings from Last 3 Encounters:  08/02/22 167 lb (75.8 kg)  06/15/22 167 lb 3.2 oz (75.8 kg)  02/09/22 166 lb 6.4 oz (75.5 kg)     Exam:    Vital Signs:  BP 131/82   Pulse 68   Temp 98.1 F (36.7 C)   Ht 5\' 2"  (1.575 m)   Wt 167 lb (75.8 kg)   BMI 30.54 kg/m     Physical Exam Vitals and nursing note reviewed.  Constitutional:      Appearance: Normal appearance.  HENT:      Head: Normocephalic and atraumatic.  Eyes:     Extraocular Movements: Extraocular movements intact.  Pulmonary:     Effort: Pulmonary effort is normal.  Musculoskeletal:     Cervical back: Normal range of motion.  Neurological:     Mental Status: She is alert and oriented to person, place, and time.  Psychiatric:        Mood and Affect: Affect normal.     ASSESSMENT & PLAN:    COVID-19 Assessment & Plan:  She would like treatment, rx paxlovid sent to her pharmacy. Last  GFR above 80 in June 2024.  Advised to take full course, possible side effects d/w patient. I will also refer her for home monitoring/temperature monitoring program. She is encouraged to email me daily on Mychart to let me know how she is doing. She is encouraged to go to ER should she develop worsening SOB.  ADVISED TO STOP ATORVASTATIN WHILE ON PAXLOVID. SHE WILL WAIT 12 HOURS AFTER TODAY'S DOSE PRIOR TO STARTING PAXLOVID. SHE WILL HOLD DURING TREATMENT AND HOLD FOR ANOTHER FIVE DAYS.  She verbalized understanding of her treatment plan.  She is also advised to stay well hydrated, move periodically throughout the day and to have a hot beverage daily.  She verbalizes understanding of her treatment plan. All questions were answered to her satisfaction.   Orders: -     MyChart Temperature FLOWSHEET; Future  Pure hypercholesterolemia Assessment & Plan: Chronic, on statin therapy. SHE WAS ADVISED TO HOLD STATIN FOR TEN DAYS DUE TO CONCOMITANT USE OF PAXLOVID.  She verbalized understanding of her treatment plan. Side effects of worsening myalgias, liver damage and rhabdomyolysis can occur if these meds are taken together.   Other orders -     nirmatrelvir/ritonavir; Take 3 tablets by mouth 2 (two) times daily for 5 days. Patient GFR is 86.  Dispense: 30 tablet; Refill: 0 -     MYCHART COVID-19 HOME MONITORING PROGRAM; Future     COVID-19 Education: The signs and symptoms of COVID-19 were discussed with the patient and  how to seek care for testing (follow up with PCP or arrange E-visit).  The importance of social distancing was discussed today.  Patient Risk:   After full review of this patients clinical status, I feel that they are at least moderate risk at this time.  Time:   Today, I have spent 12 minutes/ seconds with the patient with telehealth technology discussing above diagnoses.     Medication Adjustments/Labs and Tests Ordered: Current medicines are reviewed at length with the patient today.  Concerns regarding medicines are outlined above.   Tests Ordered: No orders of the defined types were placed in this encounter.   Medication Changes: Meds ordered this encounter  Medications   nirmatrelvir/ritonavir (PAXLOVID) 20 x 150 MG & 10 x 100MG  TABS    Sig: Take 3 tablets by mouth 2 (two) times daily for 5 days. Patient GFR is 86.    Dispense:  30 tablet    Refill:  0    Disposition:  Follow up prn  Signed, Gwynneth Aliment, MD

## 2022-08-03 ENCOUNTER — Encounter: Payer: Self-pay | Admitting: Internal Medicine

## 2022-08-05 ENCOUNTER — Encounter: Payer: Self-pay | Admitting: Internal Medicine

## 2022-08-10 ENCOUNTER — Encounter: Payer: Self-pay | Admitting: Internal Medicine

## 2022-08-17 ENCOUNTER — Ambulatory Visit (INDEPENDENT_AMBULATORY_CARE_PROVIDER_SITE_OTHER): Payer: Medicare Other | Admitting: Gastroenterology

## 2022-08-17 ENCOUNTER — Encounter: Payer: Self-pay | Admitting: Gastroenterology

## 2022-08-17 VITALS — BP 136/80 | HR 65 | Ht 62.0 in | Wt 160.0 lb

## 2022-08-17 DIAGNOSIS — Z86010 Personal history of colonic polyps: Secondary | ICD-10-CM

## 2022-08-17 DIAGNOSIS — Z800 Family history of malignant neoplasm of digestive organs: Secondary | ICD-10-CM | POA: Diagnosis not present

## 2022-08-17 DIAGNOSIS — R011 Cardiac murmur, unspecified: Secondary | ICD-10-CM | POA: Diagnosis not present

## 2022-08-17 NOTE — Patient Instructions (Addendum)
A referral was placed for Cardiology they will contact you to schedule an appointment.  If your blood pressure at your visit was 140/90 or greater, please contact your primary care physician to follow up on this.  _______________________________________________________  If you are age 78 or older, your body mass index should be between 23-30. Your Body mass index is 29.26 kg/m. If this is out of the aforementioned range listed, please consider follow up with your Primary Care Provider.  If you are age 2 or younger, your body mass index should be between 19-25. Your Body mass index is 29.26 kg/m. If this is out of the aformentioned range listed, please consider follow up with your Primary Care Provider.   ________________________________________________________  The [facility] providers would like to encourage you to use [facility] to communicate with providers for non-urgent requests or questions.  Due to long hold times on the telephone, sending your provider a message by [facility] may be a faster and more efficient way to get a response.  Please allow 48 business hours for a response.  Please remember that this is for non-urgent requests.  _______________________________________________________    Thank you for entrusting me with your care and choosing [facility].  Bayley McMichael PA-C

## 2022-08-17 NOTE — Progress Notes (Signed)
Chief Complaint: recall colon Primary GI MD: (Dr. Christella Hartigan)  HPI: 77 year old female with medical history as listed below presents for evaluation of recall colonoscopy  Last seen in 2019 by Hyacinth Meeker, PA-C At that time she had 2 days of rectal bleeding.  Underwent colonoscopy 05/2017 for further evaluation. Colonoscopy was normal with no polyps.  Recall recommended in 5 years due to family history of colon cancer.  Family history of colon cancer in father age 44 and in mother age 34.  Patient denies GI issues today.  Denies change in bowel habits, rectal bleeding, melena.   PREVIOUS GI WORKUP   1.  Family history of colon cancer (both parents): Colonoscopy 05/2008 with Dr. Loreta Ave done for "personal history of polyps, FH of colon cancer (mother & father)" Two "small cecal polyps" that were not adenomas by pathology; otherwise normal examination. She was recommended to have repeat colonoscopy at 3 year interval for unclear reasons. 2.  Halitosis led to EGD done 2012 was found small HH only, was referred to rheum due to esophagram suggesting scleroderma. Rheum said she does NOT have scleroderma. 3. 06/04/13 colonoscopy with one polyp.  It was recommended given patient's significant family history of colon cancer, repeat in 5 years even if polyp was not precancerous.  Pathology was adenomatous.. 4.  05/2017 colonoscopy for rectal bleeding: No polyps, entire examination was normal  Past Medical History:  Diagnosis Date   Breast lump    Colon polyps    Fibroids    GERD (gastroesophageal reflux disease)    Glaucoma    Hyperlipidemia 1986   Hypertension 1986   Interstitial cystitis 1999   Kidney stones 2005   Osteoporosis    Tonsillitis     Past Surgical History:  Procedure Laterality Date   BREAST EXCISIONAL BIOPSY Right 07/27/2006   Intraductal Papilloma   CATARACT EXTRACTION Bilateral 02/2020   other in 05/2020   COLONOSCOPY  2010   Dr Loreta Ave   HYSTEROSCOPY  1993   LAPAROSCOPIC  HYSTERECTOMY  1994   MYOMECTOMY  1993   excision of tumor   POLYPECTOMY     TONSILLECTOMY  1953    Current Outpatient Medications  Medication Sig Dispense Refill   amLODipine (NORVASC) 5 MG tablet Take 1 tablet (5 mg total) by mouth daily. 90 tablet 2   aspirin EC 81 MG tablet Take 1 tablet (81 mg total) by mouth daily. Swallow whole. 150 tablet 2   atorvastatin (LIPITOR) 80 MG tablet Take 1 tablet (80 mg total) by mouth daily. 90 tablet 1   CALCIUM PO Take 1 tablet by mouth daily. 500 mg daily     Cholecalciferol (VITAMIN D PO) Take 1 tablet by mouth daily. 2000 units daily     dorzolamide-timolol (COSOPT) 22.3-6.8 MG/ML ophthalmic solution      ezetimibe (ZETIA) 10 MG tablet Take 1 tablet (10 mg total) by mouth daily. 90 tablet 3   finasteride (PROSCAR) 5 MG tablet Take 5 mg by mouth daily.     latanoprost (XALATAN) 0.005 % ophthalmic solution Place 1 drop into both eyes daily.     lisinopril (ZESTRIL) 20 MG tablet Take 1 tablet (20 mg total) by mouth daily. 90 tablet 1   Current Facility-Administered Medications  Medication Dose Route Frequency Provider Last Rate Last Admin   ezetimibe (ZETIA) tablet 10 mg  10 mg Oral Daily Dorothyann Peng, MD        Allergies as of 08/17/2022 - Review Complete 08/02/2022  Allergen Reaction Noted  Hctz [hydrochlorothiazide] Itching 05/29/2010    Family History  Problem Relation Age of Onset   Coronary artery disease Father        mother, PGM   Stroke Father    Hypertension Father    Colon cancer Father 61   Hypertension Mother    Heart disease Mother    Colon cancer Mother 60   Diabetes Other    Colon cancer Maternal Grandmother    Breast cancer Neg Hx     Social History   Socioeconomic History   Marital status: Married    Spouse name: Not on file   Number of children: 2   Years of education: Not on file   Highest education level: Doctorate  Occupational History   Occupation: retired  Tobacco Use   Smoking status: Never    Smokeless tobacco: Never  Vaping Use   Vaping status: Never Used  Substance and Sexual Activity   Alcohol use: Yes    Alcohol/week: 12.0 standard drinks of alcohol    Types: 6 Standard drinks or equivalent, 6 Glasses of wine per week   Drug use: No   Sexual activity: Not Currently  Other Topics Concern   Not on file  Social History Narrative   Not on file   Social Determinants of Health   Financial Resource Strain: Low Risk  (06/11/2022)   Overall Financial Resource Strain (CARDIA)    Difficulty of Paying Living Expenses: Not hard at all  Food Insecurity: No Food Insecurity (06/11/2022)   Hunger Vital Sign    Worried About Running Out of Food in the Last Year: Never true    Ran Out of Food in the Last Year: Never true  Transportation Needs: No Transportation Needs (06/11/2022)   PRAPARE - Administrator, Civil Service (Medical): No    Lack of Transportation (Non-Medical): No  Physical Activity: Sufficiently Active (06/11/2022)   Exercise Vital Sign    Days of Exercise per Week: 5 days    Minutes of Exercise per Session: 40 min  Stress: No Stress Concern Present (06/11/2022)   [employer] of Occupational Health - Occupational Stress Questionnaire    Feeling of Stress : Not at all  Social Connections: Moderately Isolated (06/11/2022)   Social Connection and Isolation Panel [NHANES]    Frequency of Communication with Friends and Family: Three times a week    Frequency of Social Gatherings with Friends and Family: Twice a week    Attends Religious Services: Never    Database administrator or Organizations: No    Attends Engineer, structural: Not on file    Marital Status: Married  Catering manager Violence: Not At Risk (05/18/2018)   Humiliation, Afraid, Rape, and Kick questionnaire    Fear of Current or Ex-Partner: No    Emotionally Abused: No    Physically Abused: No    Sexually Abused: No    Review of Systems:    Constitutional: No weight loss, fever,  chills, weakness or fatigue HEENT: Eyes: No change in vision               Ears, Nose, Throat:  No change in hearing or congestion Skin: No rash or itching Cardiovascular: No chest pain, chest pressure or palpitations   Respiratory: No SOB or cough Gastrointestinal: See HPI and otherwise negative Genitourinary: No dysuria or change in urinary frequency Neurological: No headache, dizziness or syncope Musculoskeletal: No new muscle or joint pain Hematologic: No bleeding or bruising Psychiatric: No  history of depression or anxiety    Physical Exam:  Vital signs: There were no vitals taken for this visit.  Constitutional: NAD, Well developed, Well nourished, alert and cooperative. Appears much younger than stated age (appears to be 50s-early 38s) Head:  Normocephalic and atraumatic. Eyes:   PEERL, EOMI. No icterus. Conjunctiva pink. Respiratory: Respirations even and unlabored. Lungs clear to auscultation bilaterally.   No wheezes, crackles, or rhonchi.  Cardiovascular: Regular rate and rhythm.  No peripheral edema.  Systolic low-grade heart murmur at second intercostal over right sternal border Gastrointestinal:  Soft, nondistended, nontender. No rebound or guarding. Normal bowel sounds. No appreciable masses or hepatomegaly. Rectal:  Not performed.  Msk:  Symmetrical without gross deformities. Without edema, no deformity or joint abnormality.  Neurologic:  Alert and  oriented x4;  grossly normal neurologically.  Skin:   Dry and intact without significant lesions or rashes. Psychiatric: Oriented to person, place and time. Demonstrates good judgement and reason without abnormal affect or behaviors.   RELEVANT LABS AND IMAGING: CBC    Component Value Date/Time   WBC 7.6 02/09/2022 0913   WBC 8.1 11/05/2011 1534   RBC 4.66 02/09/2022 0913   RBC 4.25 11/05/2011 1534   HGB 14.2 02/09/2022 0913   HCT 41.9 02/09/2022 0913   PLT 314 02/09/2022 0913   MCV 90 02/09/2022 0913   MCH 30.5  02/09/2022 0913   MCH 31.4 09/07/2011 1359   MCHC 33.9 02/09/2022 0913   MCHC 32.5 11/05/2011 1534   RDW 13.3 02/09/2022 0913   LYMPHSABS 1.5 05/25/2018 1029   MONOABS 0.6 11/05/2011 1534   EOSABS 0.1 05/25/2018 1029   BASOSABS 0.1 05/25/2018 1029    CMP     Component Value Date/Time   NA 144 06/15/2022 1204   K 5.0 06/15/2022 1204   CL 108 (H) 06/15/2022 1204   CO2 22 06/15/2022 1204   GLUCOSE 93 06/15/2022 1204   GLUCOSE 101 (H) 09/07/2011 1359   BUN 13 06/15/2022 1204   CREATININE 0.72 06/15/2022 1204   CALCIUM 10.0 06/15/2022 1204   PROT 6.6 02/09/2022 0913   ALBUMIN 4.2 02/09/2022 0913   AST 37 02/09/2022 0913   ALT 51 (H) 02/09/2022 0913   ALKPHOS 99 02/09/2022 0913   BILITOT 1.2 02/09/2022 0913   GFRNONAA 77 10/18/2019 0913   GFRAA 89 10/18/2019 0913     Assessment/Plan:   Family history of colon cancer Due for repeat colonoscopy with family history of colon cancer in both of her parents.  However on physical exam today I noted a heart murmur.  Patient denies previous known history of heart murmur and denies symptoms such as peripheral edema, chest pain, shortness of breath, claudication.  Patient appears to be in excellent health is an adequate candidate to undergo colonoscopy especially with her history of colon cancer in her parents.  However, with presence of new heart murmur it may be best to be evaluated with cardiology prior to undergoing procedure out of an abundance of precaution. --- Reminder placed in system  to check back in October after patient has been evaluated by cardiology prior to scheduling her procedure  Newly recognized heart murmur Recent echocardiogram in 2023 showed no significant abnormalities or issues with her heart valves.  Ejection fraction was 70 to 75%.  Systolic heart murmur heard today, low-grade, second intercostal right sternal border area.  Patient denies previous history of heart murmur.  Most likely physiologic especially in the  absence of symptoms.  However, out of  an abundance of precaution would recommend cardiology evaluation prior to undergoing elective screening procedure especially given her age. --- Referral to cardiology --- Alert made to check back with patient in October after cardiac workup to reschedule her colonoscopy  L[facility] 08/17/2022, 11:03 AM  Cc: Dorothyann Peng, MD

## 2022-08-18 NOTE — Progress Notes (Signed)
Attending Physician's Attestation   I have reviewed the chart.   I agree with the Advanced Practitioner's note, impression, and recommendations with any updates as below. Agree it makes sense for [place] to ensure that there is no other issues that are occurring prior to putting her off to sleep.  Agree with follow-up with cardiology or PCP in regards to the newly discovered murmur.  Depending on how patient is doing and if there are no other significant issues and required, then we can move forward with scheduling her colonoscopy thereafter.   Corliss Parish, MD [facility]oscopy Office # [phone]

## 2022-08-21 ENCOUNTER — Encounter: Payer: Self-pay | Admitting: Internal Medicine

## 2022-08-23 DIAGNOSIS — M25511 Pain in right shoulder: Secondary | ICD-10-CM | POA: Diagnosis not present

## 2022-08-24 NOTE — Telephone Encounter (Signed)
Error

## 2022-08-31 ENCOUNTER — Ambulatory Visit: Payer: Medicare Other

## 2022-08-31 VITALS — BP 120/72 | HR 63 | Temp 98.1°F | Ht 62.0 in | Wt 160.0 lb

## 2022-08-31 DIAGNOSIS — I10 Essential (primary) hypertension: Secondary | ICD-10-CM

## 2022-08-31 NOTE — Progress Notes (Signed)
Patient presents today for a bp check. Patient reports she takes the lisinopril 20mg  in the morning and amlodipine 5mg  in the evenings. I checked her blood pressure and it was 120/72 p63. I spoke with provider and pt was advised to continue with current meds and keep her next appt with provider as scheduled. YL,RMA   BP Readings from Last 3 Encounters:  08/17/22 136/80  08/02/22 131/82  06/15/22 136/84

## 2022-09-05 ENCOUNTER — Other Ambulatory Visit: Payer: Self-pay | Admitting: Internal Medicine

## 2022-09-15 ENCOUNTER — Encounter: Payer: Self-pay | Admitting: Internal Medicine

## 2022-09-15 ENCOUNTER — Ambulatory Visit (INDEPENDENT_AMBULATORY_CARE_PROVIDER_SITE_OTHER): Payer: Medicare Other

## 2022-09-15 ENCOUNTER — Ambulatory Visit: Payer: Medicare Other | Admitting: Internal Medicine

## 2022-09-15 VITALS — BP 142/80 | HR 74 | Temp 97.9°F | Ht 62.0 in | Wt 167.6 lb

## 2022-09-15 VITALS — BP 142/80 | HR 74 | Temp 97.9°F | Ht 62.0 in | Wt 167.5 lb

## 2022-09-15 DIAGNOSIS — R7309 Other abnormal glucose: Secondary | ICD-10-CM | POA: Diagnosis not present

## 2022-09-15 DIAGNOSIS — I10 Essential (primary) hypertension: Secondary | ICD-10-CM | POA: Diagnosis not present

## 2022-09-15 DIAGNOSIS — E6609 Other obesity due to excess calories: Secondary | ICD-10-CM

## 2022-09-15 DIAGNOSIS — Z0000 Encounter for general adult medical examination without abnormal findings: Secondary | ICD-10-CM | POA: Diagnosis not present

## 2022-09-15 DIAGNOSIS — Z23 Encounter for immunization: Secondary | ICD-10-CM

## 2022-09-15 DIAGNOSIS — Z6830 Body mass index (BMI) 30.0-30.9, adult: Secondary | ICD-10-CM | POA: Diagnosis not present

## 2022-09-15 DIAGNOSIS — I358 Other nonrheumatic aortic valve disorders: Secondary | ICD-10-CM | POA: Diagnosis not present

## 2022-09-15 DIAGNOSIS — M7521 Bicipital tendinitis, right shoulder: Secondary | ICD-10-CM | POA: Diagnosis not present

## 2022-09-15 DIAGNOSIS — E7800 Pure hypercholesterolemia, unspecified: Secondary | ICD-10-CM

## 2022-09-15 LAB — CMP14+EGFR
ALT: 21 IU/L (ref 0–32)
AST: 22 IU/L (ref 0–40)
Albumin: 4.1 g/dL (ref 3.8–4.8)
Alkaline Phosphatase: 91 IU/L (ref 44–121)
BUN/Creatinine Ratio: 16 (ref 12–28)
BUN: 13 mg/dL (ref 8–27)
Bilirubin Total: 1.3 mg/dL — ABNORMAL HIGH (ref 0.0–1.2)
CO2: 21 mmol/L (ref 20–29)
Calcium: 9.8 mg/dL (ref 8.7–10.3)
Chloride: 111 mmol/L — ABNORMAL HIGH (ref 96–106)
Creatinine, Ser: 0.81 mg/dL (ref 0.57–1.00)
Globulin, Total: 2.1 g/dL (ref 1.5–4.5)
Glucose: 106 mg/dL — ABNORMAL HIGH (ref 70–99)
Potassium: 4.3 mmol/L (ref 3.5–5.2)
Sodium: 144 mmol/L (ref 134–144)
Total Protein: 6.2 g/dL (ref 6.0–8.5)
eGFR: 74 mL/min/{1.73_m2} (ref >59)

## 2022-09-15 LAB — HEMOGLOBIN A1C
Est. average glucose Bld gHb Est-mCnc: 131 mg/dL
Hgb A1c MFr Bld: 6.2 % — ABNORMAL HIGH (ref 4.8–5.6)

## 2022-09-15 LAB — TSH: TSH: 2.97 u[IU]/mL (ref 0.450–4.500)

## 2022-09-15 MED ORDER — SCOPOLAMINE 1 MG/3DAYS TD PT72: 1.0000 | MEDICATED_PATCH | TRANSDERMAL | 0 refills | Status: DC

## 2022-09-15 NOTE — Patient Instructions (Signed)
Hypertension, Adult Hypertension is another name for high blood pressure. High blood pressure forces your heart to work harder to pump blood. This can cause problems over time. There are two numbers in a blood pressure reading. There is a top number (systolic) over a bottom number (diastolic). It is best to have a blood pressure that is below 120/80. What are the causes? The cause of this condition is not known. Some other conditions can lead to high blood pressure. What increases the risk? Some lifestyle factors can make you more likely to develop high blood pressure: Smoking. Not getting enough exercise or physical activity. Being overweight. Having too much fat, sugar, calories, or salt (sodium) in your diet. Drinking too much alcohol. Other risk factors include: Having any of these conditions: Heart disease. Diabetes. High cholesterol. Kidney disease. Obstructive sleep apnea. Having a family history of high blood pressure and high cholesterol. Age. The risk increases with age. Stress. What are the signs or symptoms? High blood pressure may not cause symptoms. Very high blood pressure (hypertensive crisis) may cause: Headache. Fast or uneven heartbeats (palpitations). Shortness of breath. Nosebleed. Vomiting or feeling like you may vomit (nauseous). Changes in how you see. Very bad chest pain. Feeling dizzy. Seizures. How is this treated? This condition is treated by making healthy lifestyle changes, such as: Eating healthy foods. Exercising more. Drinking less alcohol. Your doctor may prescribe medicine if lifestyle changes do not help enough and if: Your top number is above 130. Your bottom number is above 80. Your personal target blood pressure may vary. Follow these instructions at home: Eating and drinking  If told, follow the DASH eating plan. To follow this plan: Fill one half of your plate at each meal with fruits and vegetables. Fill one fourth of your plate  at each meal with whole grains. Whole grains include whole-wheat pasta, brown rice, and whole-grain bread. Eat or drink low-fat dairy products, such as skim milk or low-fat yogurt. Fill one fourth of your plate at each meal with low-fat (lean) proteins. Low-fat proteins include fish, chicken without skin, eggs, beans, and tofu. Avoid fatty meat, cured and processed meat, or chicken with skin. Avoid pre-made or processed food. Limit the amount of salt in your diet to less than 1,500 mg each day. Do not drink alcohol if: Your doctor tells you not to drink. You are pregnant, may be pregnant, or are planning to become pregnant. If you drink alcohol: Limit how much you have to: 0-1 drink a day for women. 0-2 drinks a day for men. Know how much alcohol is in your drink. In the [place] one drink equals one 12 oz bottle of beer (355 mL), one 5 oz glass of wine (148 mL), or one 1 oz glass of hard liquor (44 mL). Lifestyle  Work with your doctor to stay at a healthy weight or to lose weight. Ask your doctor what the best weight is for you. Get at least 30 minutes of exercise that causes your heart to beat faster (aerobic exercise) most days of the week. This may include walking, swimming, or biking. Get at least 30 minutes of exercise that strengthens your muscles (resistance exercise) at least 3 days a week. This may include lifting weights or doing Pilates. Do not smoke or use any products that contain nicotine or tobacco. If you need help quitting, ask your doctor. Check your blood pressure at home as told by your doctor. Keep all follow-up visits. Medicines Take over-the-counter and prescription medicines   only as told by your doctor. Follow directions carefully. Do not skip doses of blood pressure medicine. The medicine does not work as well if you skip doses. Skipping doses also puts you at risk for problems. Ask your doctor about side effects or reactions to medicines that you should watch  for. Contact a doctor if: You think you are having a reaction to the medicine you are taking. You have headaches that keep coming back. You feel dizzy. You have swelling in your ankles. You have trouble with your vision. Get help right away if: You get a very bad headache. You start to feel mixed up (confused). You feel weak or numb. You feel faint. You have very bad pain in your: Chest. Belly (abdomen). You vomit more than once. You have trouble breathing. These symptoms may be an emergency. Get help right away. Call 911. Do not wait to see if the symptoms will go away. Do not drive yourself to the hospital. Summary Hypertension is another name for high blood pressure. High blood pressure forces your heart to work harder to pump blood. For most people, a normal blood pressure is less than 120/80. Making healthy choices can help lower blood pressure. If your blood pressure does not get lower with healthy choices, you may need to take medicine. This information is not intended to replace advice given to you by your health care provider. Make sure you discuss any questions you have with your health care provider. Document Revised: 10/09/2020 Document Reviewed: 10/09/2020 Elsevier Patient Education  2024 Elsevier Inc.  

## 2022-09-15 NOTE — Progress Notes (Signed)
Subjective:   Marissa Orozco is a 77 y.o. female who presents for Medicare Annual (Subsequent) preventive examination.  Visit Complete: In person  Patient Medicare AWV questionnaire was completed by the patient on 09/13/2022; I have confirmed that all information answered by patient is correct and no changes since this date.  Review of Systems     Cardiac Risk Factors include: hypertension     Objective:    Today's Vitals   09/15/22 0822  BP: (!) 142/80  Pulse: 74  Temp: 97.9 F (36.6 C)  TempSrc: Oral  SpO2: 96%  Weight: 167 lb 9.6 oz (76 kg)  Height: 5\' 2"  (1.575 m)   Body mass index is 30.65 kg/m.     09/15/2022    8:26 AM 11/12/2021    8:31 PM 09/03/2021    9:39 AM 08/06/2020   11:26 AM 07/19/2019    9:26 AM 05/18/2018    3:38 PM 05/27/2017    2:26 PM  Advanced Directives  Does Patient Have a Medical Advance Directive? Yes No Yes Yes Yes Yes Yes  Type of Estate agent of [place];Living will  Healthcare Power of [place];Living will Healthcare Power of [place];Living will Healthcare Power of [place];Living will Healthcare Power of [place];Living will Healthcare Power of [place];Living will  Copy of Healthcare Power of Attorney in Chart? No - copy requested  No - copy requested No - copy requested No - copy requested No - copy requested   Would patient like information on creating a medical advance directive?  No - Patient declined         Current Medications (verified) Outpatient Encounter Medications as of 09/15/2022  Medication Sig   amLODipine (NORVASC) 5 MG tablet Take 1 tablet (5 mg total) by mouth daily.   aspirin EC 81 MG tablet Take 1 tablet (81 mg total) by mouth daily. Swallow whole.   atorvastatin (LIPITOR) 80 MG tablet TAKE 1 TABLET BY MOUTH DAILY   CALCIUM PO Take 1 tablet by mouth daily. 500 mg daily   Cholecalciferol (VITAMIN D PO) Take 1 tablet by mouth daily. 2000 units daily   dorzolamide-timolol (COSOPT) 22.3-6.8 MG/ML  ophthalmic solution    ezetimibe (ZETIA) 10 MG tablet Take 1 tablet (10 mg total) by mouth daily.   finasteride (PROSCAR) 5 MG tablet Take 5 mg by mouth daily.   latanoprost (XALATAN) 0.005 % ophthalmic solution Place 1 drop into both eyes daily.   lisinopril (ZESTRIL) 20 MG tablet TAKE 1 TABLET BY MOUTH DAILY   Facility-Administered Encounter Medications as of 09/15/2022  Medication   ezetimibe (ZETIA) tablet 10 mg    Allergies (verified) Hctz [hydrochlorothiazide]   History: Past Medical History:  Diagnosis Date   Allergy 2000   Breast lump    Cataract    Colon polyps    Fibroids    GERD (gastroesophageal reflux disease)    Glaucoma    Hyperlipidemia 01/05/1984   Hypertension 01/05/1984   Interstitial cystitis 01/04/1997   Kidney stones 01/05/2003   Osteoporosis    Sleep apnea 2021   Tonsillitis    Past Surgical History:  Procedure Laterality Date   ABDOMINAL HYSTERECTOMY  1997   BREAST EXCISIONAL BIOPSY Right 07/27/2006   Intraductal Papilloma   BREAST SURGERY  2008   CATARACT EXTRACTION Bilateral 02/2020   other in 05/2020   COLONOSCOPY  2010   Dr Loreta Ave   EYE SURGERY  2022   HYSTEROSCOPY  1993   LAPAROSCOPIC HYSTERECTOMY  1994   MYOMECTOMY  1993   excision of tumor   POLYPECTOMY     TONSILLECTOMY  1953   TUBAL LIGATION  1980   Family History  Problem Relation Age of Onset   Coronary artery disease Father        mother, PGM   Stroke Father    Hypertension Father    Colon cancer Father 94   Cancer Father    Hypertension Mother    Heart disease Mother    Colon cancer Mother 73   Arthritis Mother    Cancer Mother    Diabetes Other    Colon cancer Maternal Grandmother    Breast cancer Neg Hx    Social History   Socioeconomic History   Marital status: Married    Spouse name: Not on file   Number of children: 2   Years of education: Not on file   Highest education level: Doctorate  Occupational History   Occupation: retired   Occupation: retired   Tobacco Use   Smoking status: Never   Smokeless tobacco: Never  Vaping Use   Vaping status: Never Used  Substance and Sexual Activity   Alcohol use: Yes    Alcohol/week: 5.0 standard drinks of alcohol    Types: 5 Glasses of wine per week   Drug use: No   Sexual activity: Not Currently  Other Topics Concern   Not on file  Social History Narrative   Not on file   Social Determinants of Health   Financial Resource Strain: Low Risk  (09/13/2022)   Overall Financial Resource Strain (CARDIA)    Difficulty of Paying Living Expenses: Not hard at all  Food Insecurity: No Food Insecurity (09/13/2022)   Hunger Vital Sign    Worried About Running Out of Food in the Last Year: Never true    Ran Out of Food in the Last Year: Never true  Transportation Needs: No Transportation Needs (09/13/2022)   PRAPARE - Administrator, Civil Service (Medical): No    Lack of Transportation (Non-Medical): No  Physical Activity: Sufficiently Active (09/13/2022)   Exercise Vital Sign    Days of Exercise per Week: 5 days    Minutes of Exercise per Session: 40 min  Stress: No Stress Concern Present (09/13/2022)   [employer] of Occupational Health - Occupational Stress Questionnaire    Feeling of Stress : Not at all  Social Connections: Moderately Isolated (09/13/2022)   Social Connection and Isolation Panel [NHANES]    Frequency of Communication with Friends and Family: Three times a week    Frequency of Social Gatherings with Friends and Family: Twice a week    Attends Religious Services: Never    Database administrator or Organizations: No    Attends Engineer, structural: Never    Marital Status: Married    Tobacco Counseling Counseling given: Not Answered   Clinical Intake:  Pre-visit preparation completed: Yes  Pain : No/denies pain     Nutritional Status: BMI > 30  Obese Nutritional Risks: None Diabetes: No  How often do you need to have someone help you when you  read instructions, pamphlets, or other written materials from your doctor or pharmacy?: 1 - Never  Interpreter Needed?: No  Information entered by :: NAllen LPN   Activities of Daily Living    09/13/2022    8:15 PM  In your present state of health, do you have any difficulty performing the following activities:  Hearing? 0  Vision? 0  Difficulty  concentrating or making decisions? 0  Walking or climbing stairs? 0  Dressing or bathing? 0  Doing errands, shopping? 0  Preparing Food and eating ? N  Using the Toilet? N  In the past six months, have you accidently leaked urine? N  Do you have problems with loss of bowel control? N  Managing your Medications? N  Managing your Finances? N  Housekeeping or managing your Housekeeping? N    Patient Care Team: Dorothyann Peng, MD as PCP - General (Internal Medicine) Pa, [facility]  Indicate any recent Medical Services you may have received from other than [facility] providers in the past year (date may be approximate).     Assessment:   This is a routine wellness examination for [place].  Hearing/Vision screen Hearing Screening - Comments:: Denies hearing issues Vision Screening - Comments:: Regular eye exams, [facility]   Goals Addressed             This Visit's Progress    Patient Stated       09/15/2022, wants to lose weight       Depression Screen    09/15/2022    8:28 AM 06/15/2022   11:14 AM 02/09/2022    8:30 AM 09/03/2021    9:40 AM 09/03/2021    9:16 AM 08/06/2020   11:26 AM 07/19/2019    9:27 AM  PHQ 2/9 Scores  PHQ - 2 Score 0 0 0 0 0 0 0  PHQ- 9 Score 0 0     1    Fall Risk    09/13/2022    8:15 PM 06/15/2022   11:14 AM 02/09/2022    8:30 AM 09/03/2021    9:40 AM 09/03/2021    9:16 AM  Fall Risk   Falls in the past year? 1 0 1 0 0  Comment slipped getting out of shower      Number falls in past yr: 0 0 0 0 0  Injury with Fall? 1 0 1 0 0  Comment broke wrist      Risk for fall due to :  Medication side effect No Fall Risks History of fall(s) No Fall Risks No Fall Risks  Follow up Falls prevention discussed;Falls evaluation completed Falls evaluation completed Falls evaluation completed Falls prevention discussed;Education provided;Falls evaluation completed Falls evaluation completed    MEDICARE RISK AT HOME: Medicare Risk at Home Any stairs in or around the home?: Yes If so, are there any without handrails?: Yes Home free of loose throw rugs in walkways, pet beds, electrical cords, etc?: Yes Adequate lighting in your home to reduce risk of falls?: Yes Life alert?: No Use of a cane, walker or w/c?: No Grab bars in the bathroom?: No Shower chair or bench in shower?: Yes Elevated toilet seat or a handicapped toilet?: No  TIMED UP AND GO:  Was the test performed?  Yes  Length of time to ambulate 10 feet: 5 sec Gait steady and fast without use of assistive device    Cognitive Function:        09/15/2022    8:28 AM 09/03/2021    9:40 AM 08/06/2020   11:27 AM 07/19/2019    9:28 AM 05/18/2018    3:41 PM  6CIT Screen  What Year? 0 points 0 points 0 points 0 points 0 points  What month? 0 points 0 points 0 points 0 points 0 points  What time? 0 points 0 points 0 points 0 points 0 points  Count back  from 20 0 points 0 points 0 points 0 points 0 points  Months in reverse 0 points 0 points 0 points 0 points 0 points  Repeat phrase 2 points 2 points 2 points 0 points 0 points  Total Score 2 points 2 points 2 points 0 points 0 points    Immunizations Immunization History  Administered Date(s) Administered   Fluad Quad(high Dose 65+) 10/17/2019, 10/22/2020   Influenza, High Dose Seasonal PF 11/20/2018   Influenza-Unspecified 09/02/2017   Moderna SARS-COV2 Booster Vaccination 11/02/2019, 10/22/2020   Moderna Sars-Covid-2 Vaccination 01/26/2019, 03/02/2019   PNEUMOCOCCAL CONJUGATE-20 01/02/2021   Pneumococcal Polysaccharide-23 01/30/2018   Tdap 06/01/2012, 06/15/2022    Zoster Recombinant(Shingrix) 09/03/2021, 02/09/2022   Zoster, Live 10/17/2012    TDAP status: Up to date  Flu Vaccine status: Completed at today's visit  Pneumococcal vaccine status: Up to date  Covid-19 vaccine status: Information provided on how to obtain vaccines.   Qualifies for Shingles Vaccine? Yes   Zostavax completed Yes   Shingrix Completed?: Yes  Screening Tests Health Maintenance  Topic Date Due   COVID-19 Vaccine (3 - Moderna risk series) 11/19/2020   Colonoscopy  05/28/2022   INFLUENZA VACCINE  08/05/2022   Medicare Annual Wellness (AWV)  09/15/2023   DTaP/Tdap/Td (3 - Td or Tdap) 06/14/2032   Pneumonia Vaccine 1+ Years old  Completed   DEXA SCAN  Completed   Hepatitis C Screening  Completed   Zoster Vaccines- Shingrix  Completed   HPV VACCINES  Aged Out    Health Maintenance  Health Maintenance Due  Topic Date Due   COVID-19 Vaccine (3 - Moderna risk series) 11/19/2020   Colonoscopy  05/28/2022   INFLUENZA VACCINE  08/05/2022    Colorectal cancer screening: Type of screening: Colonoscopy. Completed 05/27/2017. Repeat every 5 years  Mammogram status: Completed 05/04/2022. Repeat every year  Bone Density status: Completed 04/16/2021.   Lung Cancer Screening: (Low Dose CT Chest recommended if Age 68-80 years, 20 pack-year currently smoking OR have quit w/in 15years.) does not qualify.   Lung Cancer Screening Referral: no  Additional Screening:  Hepatitis C Screening: does qualify; Completed 04/13/2012  Vision Screening: Recommended annual ophthalmology exams for early detection of glaucoma and other disorders of the eye. Is the patient up to date with their annual eye exam?  Yes  Who is the provider or what is the name of the office in which the patient attends annual eye exams? [facility] If pt is not established with a provider, would they like to be referred to a provider to establish care? No .   Dental Screening: Recommended annual dental  exams for proper oral hygiene  Diabetic Foot Exam: n/a  Community Resource Referral / Chronic Care Management: CRR required this visit?  No   CCM required this visit?  No     Plan:     I have personally reviewed and noted the following in the patient's chart:   Medical and social history Use of alcohol, tobacco or illicit drugs  Current medications and supplements including opioid prescriptions. Patient is not currently taking opioid prescriptions. Functional ability and status Nutritional status Physical activity Advanced directives List of other physicians Hospitalizations, surgeries, and ER visits in previous 12 months Vitals Screenings to include cognitive, depression, and falls Referrals and appointments  In addition, I have reviewed and discussed with patient certain preventive protocols, quality metrics, and best practice recommendations. A written personalized care plan for preventive services as well as general preventive health recommendations were  provided to patient.     Barb Merino, LPN   [id number]   After Visit Summary: in person  Nurse Notes: none

## 2022-09-15 NOTE — Patient Instructions (Addendum)
Marissa Orozco , Thank you for taking time to come for your Medicare Wellness Visit. I appreciate your ongoing commitment to your health goals. Please review the following plan we discussed and let me know if I can assist you in the future.   Referrals/Orders/Follow-Ups/Clinician Recommendations: none  This is a list of the screening recommended for you and due dates:  Health Maintenance  Topic Date Due   COVID-19 Vaccine (3 - Moderna risk series) 11/19/2020   Colon Cancer Screening  05/28/2022   Medicare Annual Wellness Visit  09/15/2023   DTaP/Tdap/Td vaccine (3 - Td or Tdap) 06/14/2032   Pneumonia Vaccine  Completed   Flu Shot  Completed   DEXA scan (bone density measurement)  Completed   Hepatitis C Screening  Completed   Zoster (Shingles) Vaccine  Completed   HPV Vaccine  Aged Out    Advanced directives: (Copy Requested) Please bring a copy of your health care power of attorney and living will to the office to be added to your chart at your convenience.  Next Medicare Annual Wellness Visit scheduled for next year: No, office will schedule  Insert Preventive Care attachment Insert FALL PREVENTION attachment if needed

## 2022-09-23 ENCOUNTER — Encounter: Payer: Self-pay | Admitting: Internal Medicine

## 2022-09-26 DIAGNOSIS — E6609 Other obesity due to excess calories: Secondary | ICD-10-CM | POA: Insufficient documentation

## 2022-09-26 DIAGNOSIS — E66811 Obesity, class 1: Secondary | ICD-10-CM | POA: Insufficient documentation

## 2022-09-26 DIAGNOSIS — R7309 Other abnormal glucose: Secondary | ICD-10-CM | POA: Insufficient documentation

## 2022-09-26 DIAGNOSIS — M7521 Bicipital tendinitis, right shoulder: Secondary | ICD-10-CM | POA: Insufficient documentation

## 2022-09-26 DIAGNOSIS — I358 Other nonrheumatic aortic valve disorders: Secondary | ICD-10-CM | POA: Insufficient documentation

## 2022-09-26 NOTE — Assessment & Plan Note (Signed)
Chronic, on statin therapy. Encouraged to follow heart healthy lifestyle.

## 2022-09-26 NOTE — Assessment & Plan Note (Signed)
Chronic, last echocardiogram was in 2023. She has upcoming Cardiology evaluation, I will see if I can arrange an earlier appt.

## 2022-09-26 NOTE — Assessment & Plan Note (Signed)
She is encouraged to aim for at least 150 minutes of exercise per week.

## 2022-09-26 NOTE — Assessment & Plan Note (Signed)
Previous labs reviewed, her A1c has been elevated in the past. I will check an A1c today. Reminded to avoid refined sugars including sugary drinks/foods and processed meats including bacon, sausages and deli meats.  

## 2022-10-02 NOTE — Assessment & Plan Note (Signed)
Chronic, uncontrolled. I will not make any med changes since she has upcoming Cardiology evaluation. I will try to schedule a sooner appt with a different provider. I will check renal function today. As of now, she will f/u in six months as previously scheduled.

## 2022-10-02 NOTE — Assessment & Plan Note (Signed)
May need new referral to shoulder specialist, she will try to reschedule on her own.

## 2022-10-06 DIAGNOSIS — M25511 Pain in right shoulder: Secondary | ICD-10-CM | POA: Diagnosis not present

## 2022-10-09 DIAGNOSIS — M25511 Pain in right shoulder: Secondary | ICD-10-CM | POA: Diagnosis not present

## 2022-10-10 ENCOUNTER — Encounter: Payer: Self-pay | Admitting: Internal Medicine

## 2022-10-11 DIAGNOSIS — H401131 Primary open-angle glaucoma, bilateral, mild stage: Secondary | ICD-10-CM | POA: Diagnosis not present

## 2022-10-11 DIAGNOSIS — H52203 Unspecified astigmatism, bilateral: Secondary | ICD-10-CM | POA: Diagnosis not present

## 2022-10-25 ENCOUNTER — Encounter: Payer: Self-pay | Admitting: Cardiology

## 2022-10-25 ENCOUNTER — Ambulatory Visit: Payer: Medicare Other | Attending: Cardiology | Admitting: Cardiology

## 2022-10-25 VITALS — BP 134/78 | HR 60 | Ht 63.5 in | Wt 167.4 lb

## 2022-10-25 DIAGNOSIS — I358 Other nonrheumatic aortic valve disorders: Secondary | ICD-10-CM

## 2022-10-25 DIAGNOSIS — R7309 Other abnormal glucose: Secondary | ICD-10-CM

## 2022-10-25 DIAGNOSIS — Z8673 Personal history of transient ischemic attack (TIA), and cerebral infarction without residual deficits: Secondary | ICD-10-CM | POA: Diagnosis not present

## 2022-10-25 DIAGNOSIS — E7800 Pure hypercholesterolemia, unspecified: Secondary | ICD-10-CM | POA: Diagnosis not present

## 2022-10-25 DIAGNOSIS — I6523 Occlusion and stenosis of bilateral carotid arteries: Secondary | ICD-10-CM

## 2022-10-25 DIAGNOSIS — I10 Essential (primary) hypertension: Secondary | ICD-10-CM

## 2022-10-25 DIAGNOSIS — Z01810 Encounter for preprocedural cardiovascular examination: Secondary | ICD-10-CM | POA: Diagnosis not present

## 2022-10-25 DIAGNOSIS — E785 Hyperlipidemia, unspecified: Secondary | ICD-10-CM | POA: Diagnosis not present

## 2022-10-25 HISTORY — DX: Encounter for preprocedural cardiovascular examination: Z01.810
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
## 2022-10-25 NOTE — Assessment & Plan Note (Signed)
Continue risk factor modification.  Had a coronary calcium score and CTA done in 2013 that was relatively reassuring.  Target LDL should be less than 70 based on carotid and coronary plaque. BP has been well-controlled. No recurrent CVA symptoms.  He is on aspirin.

## 2022-10-25 NOTE — Patient Instructions (Signed)
Medication Instructions:   No changes     Lab Work: Not needed    Testing/Procedures: Will be schedule at [facility] [address] Your physician has requested that you have an echocardiogram. Echocardiography is a painless test that uses sound waves to create images of your heart. It provides your doctor with information about the size and shape of your heart and how well your heart's chambers and valves are working. This procedure takes approximately one hour. There are no restrictions for this procedure. Please do NOT wear cologne, perfume, aftershave, or lotions (deodorant is allowed). Please arrive 15 minutes prior to your appointment time.    Follow-Up: At [facility], you and your health needs are our priority.  As part of our continuing mission to provide you with exceptional heart care, we have created designated Provider Care Teams.  These Care Teams include your primary Cardiologist (physician) and Advanced Practice Providers (APPs -  Physician Assistants and Nurse Practitioners) who all work together to provide you with the care you need, when you need it.     Your next appointment:   If test is normal - follow up as needed  The format for your next appointment:   In Person  Provider:   Bryan Lemma, MD

## 2022-10-25 NOTE — Assessment & Plan Note (Signed)
History of stroke seen on imaging studies, but no active or residual symptoms -Continue Aspirin for secondary prevention along with statin and now Zetia -Continue BP control.

## 2022-10-25 NOTE — Assessment & Plan Note (Signed)
Recent HbA1c of 6.2. -Advise on diet and exercise to manage pre-diabetes.

## 2022-10-25 NOTE — Assessment & Plan Note (Signed)
Previously noted on echocardiogram, but there was no description of murmur on exam at that time. Newly detected murmur, possibly due to aortic sclerosis noted on previous echocardiogram in May 2023. However, exam findings did not suggest AoV SEM until recently -? if this indicates progression of Sclerosis to Stenosis.  No symptoms of heart disease reported. -Order echocardiogram to assess current state of aortic valve and compare to previous studies. -If echocardiogram is stable, plan to reassess in three years unless murmur worsens.

## 2022-10-25 NOTE — Assessment & Plan Note (Signed)
Hyperlipidemia On Atorvastatin 80mg  and Zetia 10mg  added for recent LDL that was slightly elevated at 92. -Continue current medications and monitor cholesterol levels. - if not at goal with Zetia alone and atorvastatin, would convert to Nexlizet

## 2022-10-25 NOTE — Assessment & Plan Note (Signed)
Upcoming Colonoscopy procedure. No contraindications noted from cardiac perspective. -Proceed with colonoscopy as planned.  -> Cardiac murmur is likely something that was already noted on previous echo.  We are simply checking another echo to confirm but this should not preclude her from having colonoscopy.  No active symptoms. No need for antibiotics.

## 2022-10-25 NOTE — Assessment & Plan Note (Signed)
Well-controlled on current medication regimen (Lisinopril 20mg  and Amlodipine 5mg ). -Continue current medications.

## 2022-10-27 ENCOUNTER — Telehealth: Payer: Self-pay | Admitting: *Deleted

## 2022-10-27 NOTE — Telephone Encounter (Signed)
-----   Message from Legrand Como sent at 10/27/2022  9:21 AM EDT ----- Regarding: colonoscopy Please set patient up for colon with Dr. Meridee Score! She has been cleared by cardiology. Thanks! ----- Message ----- From: Marykay Lex, MD Sent: 10/25/2022  10:19 PM EDT To: Dorothyann Peng, MD; Legrand Como, PA-C

## 2022-10-27 NOTE — Telephone Encounter (Signed)
Spoke to patient. She has scheduled a colonoscopy with Dr Meridee Score on 12/24/22 and telephone previsit on 12/10/22  at 830 am. Patient verbalizes understanding of information.

## 2022-11-02 ENCOUNTER — Encounter: Payer: Self-pay | Admitting: Internal Medicine

## 2022-11-21 ENCOUNTER — Other Ambulatory Visit: Payer: Self-pay | Admitting: Internal Medicine

## 2022-11-24 ENCOUNTER — Ambulatory Visit (HOSPITAL_COMMUNITY): Payer: Medicare Other | Attending: Cardiology

## 2022-11-24 DIAGNOSIS — I358 Other nonrheumatic aortic valve disorders: Secondary | ICD-10-CM

## 2022-11-24 LAB — ECHOCARDIOGRAM COMPLETE
Area-P 1/2: 5.58 cm2
MV M vel: 4.56 m/s
MV Peak grad: 83.2 mm[Hg]
P 1/2 time: 725 ms
S' Lateral: 2.4 cm

## 2022-12-10 ENCOUNTER — Telehealth: Payer: Self-pay | Admitting: *Deleted

## 2022-12-10 ENCOUNTER — Ambulatory Visit (AMBULATORY_SURGERY_CENTER): Payer: Medicare Other | Admitting: *Deleted

## 2022-12-10 VITALS — Ht 63.5 in | Wt 164.0 lb

## 2022-12-10 DIAGNOSIS — Z800 Family history of malignant neoplasm of digestive organs: Secondary | ICD-10-CM

## 2022-12-10 DIAGNOSIS — Z860100 Personal history of colon polyps, unspecified: Secondary | ICD-10-CM

## 2022-12-10 MED ORDER — NA SULFATE-K SULFATE-MG SULF 17.5-3.13-1.6 GM/177ML PO SOLN: 1.0000 | Freq: Once | ORAL | 0 refills | Status: AC

## 2022-12-10 NOTE — Progress Notes (Signed)
Pt's name and DOB verified at the beginning of the pre-visit wit 2 identifiers  Pt denies any difficulty with ambulating,sitting, laying down or rolling side to side  Pt has no issues with ambulation   Pt has no issues moving head neck or swallowing  No egg or soy allergy known to patient   No issues known to pt with past sedation with any surgeries or procedures  Patient denies ever being intubated  No FH of Malignant Hyperthermia  Pt is not on diet pills or shots  Pt is not on home 02   Pt is not on blood thinners   Pt denies issues with constipation   Pt is not on dialysis  Pt has heart murmur recent diagnosis Echo was good per pt  Pt denies any upcoming cardiac testing  Pt encouraged to use to use Singlecare or Goodrx to reduce cost   Patient's chart reviewed by Marissa Orozco CNRA prior to pre-visit and patient appropriate for the [facility].  Pre-visit completed and red dot placed by patient's name on their procedure day (on provider's schedule).  .  Visit by phone  Pt states weight is 164 lb  Instructed pt why it is important to and  to call if they have any changes in health or new medications. Directed them to the # given and on instructions.     Instructions reviewed. Pt given both [facility] main # and MD on call # prior to instructions.  Pt states understanding. Instructed to review again prior to procedure. Pt states they will.   Instructions sent by mail with coupon and by My Chart  Coupon sent via text to mobile phone and pt verified they received it

## 2022-12-10 NOTE — Telephone Encounter (Signed)
Attempt to reach pt just rang Will attempt other # While calling pt called back

## 2022-12-13 ENCOUNTER — Encounter: Payer: Self-pay | Admitting: Internal Medicine

## 2022-12-20 ENCOUNTER — Other Ambulatory Visit: Payer: Self-pay

## 2022-12-20 MED ORDER — VALSARTAN 80 MG PO TABS: 80.0000 mg | ORAL_TABLET | Freq: Every day | ORAL | 0 refills | Status: DC

## 2022-12-21 ENCOUNTER — Telehealth: Payer: Self-pay | Admitting: Gastroenterology

## 2022-12-21 NOTE — Telephone Encounter (Signed)
Inbound call from patient stating she is scheduled for a colonoscopy on 12/20 and has had a change in medication. Patient is requesting a call to discuss. Please advise.

## 2022-12-21 NOTE — Telephone Encounter (Signed)
Spoke with pt. Taking Losartan instead of lisinopril. Pt is aware we don't require this medication to be held can be taken as instructed by her PCP.

## 2022-12-24 ENCOUNTER — Ambulatory Visit (AMBULATORY_SURGERY_CENTER): Payer: Medicare Other | Admitting: Internal Medicine

## 2022-12-24 ENCOUNTER — Encounter: Payer: Self-pay | Admitting: Internal Medicine

## 2022-12-24 VITALS — BP 149/73 | HR 66 | Temp 97.7°F | Resp 20 | Ht 63.5 in | Wt 164.0 lb

## 2022-12-24 DIAGNOSIS — Z800 Family history of malignant neoplasm of digestive organs: Secondary | ICD-10-CM

## 2022-12-24 DIAGNOSIS — Z1211 Encounter for screening for malignant neoplasm of colon: Secondary | ICD-10-CM | POA: Diagnosis not present

## 2022-12-24 DIAGNOSIS — I10 Essential (primary) hypertension: Secondary | ICD-10-CM | POA: Diagnosis not present

## 2022-12-24 DIAGNOSIS — E785 Hyperlipidemia, unspecified: Secondary | ICD-10-CM | POA: Diagnosis not present

## 2022-12-24 DIAGNOSIS — K648 Other hemorrhoids: Secondary | ICD-10-CM | POA: Diagnosis not present

## 2022-12-24 DIAGNOSIS — G4730 Sleep apnea, unspecified: Secondary | ICD-10-CM | POA: Diagnosis not present

## 2022-12-24 MED ORDER — FLEET ENEMA RE ENEM
1.0000 | ENEMA | Freq: Once | RECTAL | Status: AC
  Administered 2022-12-24: 1 via RECTAL

## 2022-12-24 MED ORDER — SODIUM CHLORIDE 0.9 % IV SOLN: 500.0000 mL | Freq: Once | INTRAVENOUS | Status: DC

## 2022-12-24 NOTE — Progress Notes (Signed)
GASTROENTEROLOGY PROCEDURE H&P NOTE   Primary Care Physician: Dorothyann Peng, MD    Reason for Procedure:   Family history of colon cancer  Plan:    Colonoscopy  Patient is appropriate for endoscopic procedure(s) in the ambulatory ([facility]) setting.  The nature of the procedure, as well as the risks, benefits, and alternatives were carefully and thoroughly reviewed with the patient. Ample time for discussion and questions allowed. The patient understood, was satisfied, and agreed to proceed.     HPI: Marissa Orozco is a 77 y.o. female who presents for colonoscopy for evaluation of family history of colon cancer.  Patient was most recently seen in the Gastroenterology Clinic on 08/17/22.  No interval change in medical history since that appointment. Please refer to that note for full details regarding GI history and clinical presentation.   Past Medical History:  Diagnosis Date   Allergy 2000   Breast lump    Cataract    Colon polyps    Fibroids    GERD (gastroesophageal reflux disease)    Glaucoma    Heart murmur    Hyperlipidemia 01/05/1984   Hypertension 01/05/1984   Interstitial cystitis 01/04/1997   Kidney stones 01/05/2003   Neuromuscular disorder (HCC)    Osteoporosis    Preop cardiovascular exam 10/25/2022   Sleep apnea 2021   Tonsillitis     Past Surgical History:  Procedure Laterality Date   ABDOMINAL HYSTERECTOMY  1997   BREAST EXCISIONAL BIOPSY Right 07/27/2006   Intraductal Papilloma   BREAST SURGERY  2008   CATARACT EXTRACTION Bilateral 02/2020   other in 05/2020   COLONOSCOPY  2010   Dr Loreta [address]   EYE SURGERY  2022   HYSTEROSCOPY  1993   LAPAROSCOPIC HYSTERECTOMY  1994   MYOMECTOMY  1993   excision of tumor   POLYPECTOMY     TONSILLECTOMY  1953   TUBAL LIGATION  1980    Prior to Admission medications   Medication Sig Start Date End Date Taking? Authorizing Provider  amLODipine (NORVASC) 5 MG tablet TAKE 1 TABLET BY MOUTH DAILY 11/22/22    Dorothyann Peng, MD  aspirin EC 81 MG tablet Take 1 tablet (81 mg total) by mouth daily. Swallow whole. 02/09/22 05/05/23  Dorothyann Peng, MD  atorvastatin (LIPITOR) 80 MG tablet TAKE 1 TABLET BY MOUTH DAILY 09/08/22   Dorothyann Peng, MD  CALCIUM PO Take 1 tablet by mouth daily. 500 mg daily    [provider]  Cholecalciferol (VITAMIN D PO) Take 1 tablet by mouth daily. 2000 units daily    [provider]  dorzolamide-timolol (COSOPT) 22.3-6.8 MG/ML ophthalmic solution  02/02/20   [provider]  ezetimibe (ZETIA) 10 MG tablet Take 1 tablet (10 mg total) by mouth daily. 03/22/22   Dorothyann Peng, MD  finasteride (PROSCAR) 5 MG tablet Take 5 mg by mouth daily.    [provider]  latanoprost (XALATAN) 0.005 % ophthalmic solution Place 1 drop into both eyes daily. 03/17/13   [provider]  lisinopril (ZESTRIL) 20 MG tablet TAKE 1 TABLET BY MOUTH DAILY 09/08/22   Dorothyann Peng, MD  scopolamine (TRANSDERM-SCOP) 1 MG/3DAYS Place 1 patch (1.5 mg total) onto the skin every 3 (three) days. Patient not taking: Reported on 10/25/2022 09/15/22   Dorothyann Peng, MD  valsartan (DIOVAN) 80 MG tablet Take 1 tablet (80 mg total) by mouth daily. 12/20/22   Dorothyann Peng, MD    Current Outpatient Medications  Medication Sig Dispense Refill   amLODipine (  NORVASC) 5 MG tablet TAKE 1 TABLET BY MOUTH DAILY 90 tablet 3   aspirin EC 81 MG tablet Take 1 tablet (81 mg total) by mouth daily. Swallow whole. 150 tablet 2   atorvastatin (LIPITOR) 80 MG tablet TAKE 1 TABLET BY MOUTH DAILY 90 tablet 3   CALCIUM PO Take 1 tablet by mouth daily. 500 mg daily     Cholecalciferol (VITAMIN D PO) Take 1 tablet by mouth daily. 2000 units daily     dorzolamide-timolol (COSOPT) 22.3-6.8 MG/ML ophthalmic solution      ezetimibe (ZETIA) 10 MG tablet Take 1 tablet (10 mg total) by mouth daily. 90 tablet 3   finasteride (PROSCAR) 5 MG tablet Take 5 mg by mouth daily.     latanoprost (XALATAN) 0.005  % ophthalmic solution Place 1 drop into both eyes daily.     lisinopril (ZESTRIL) 20 MG tablet TAKE 1 TABLET BY MOUTH DAILY 90 tablet 3   scopolamine (TRANSDERM-SCOP) 1 MG/3DAYS Place 1 patch (1.5 mg total) onto the skin every 3 (three) days. (Patient not taking: Reported on 10/25/2022) 10 patch 0   valsartan (DIOVAN) 80 MG tablet Take 1 tablet (80 mg total) by mouth daily. 90 tablet 0   Current Facility-Administered Medications  Medication Dose Route Frequency Provider Last Rate Last Admin   ezetimibe (ZETIA) tablet 10 mg  10 mg Oral Daily Dorothyann Peng, MD        Allergies as of 12/24/2022 - Review Complete 12/24/2022  Allergen Reaction Noted   Hctz [hydrochlorothiazide] Itching 05/29/2010    Family History  Problem Relation Age of Onset   Hypertension Mother    Heart disease Mother    Colon cancer Mother 75   Arthritis Mother    Cancer Mother    Coronary artery disease Father        mother, PGM   Stroke Father    Hypertension Father    Colon cancer Father 36   Cancer Father    Colon cancer Maternal Grandmother    Diabetes Other    Breast cancer Neg Hx    Colon polyps Neg Hx    Esophageal cancer Neg Hx    Stomach cancer Neg Hx    Rectal cancer Neg Hx     Social History   Socioeconomic History   Marital status: Married    Spouse name: Not on file   Number of children: 2   Years of education: Not on file   Highest education level: Doctorate  Occupational History   Occupation: retired   Occupation: retired  Tobacco Use   Smoking status: Never   Smokeless tobacco: Never  Vaping Use   Vaping status: Never Used  Substance and Sexual Activity   Alcohol use: Yes    Alcohol/week: 5.0 standard drinks of alcohol    Types: 5 Glasses of wine per week   Drug use: No   Sexual activity: Not Currently  Other Topics Concern   Not on file  Social History Narrative   Not on file   Social Drivers of Health   Financial Resource Strain: Low Risk  (06/11/2022)   Overall  Financial Resource Strain (CARDIA)    Difficulty of Paying Living Expenses: Not hard at all  Food Insecurity: No Food Insecurity (06/11/2022)   Hunger Vital Sign    Worried About Running Out of Food in the Last Year: Never true    Ran Out of Food in the Last Year: Never true  Transportation Needs: No Transportation Needs (06/11/2022)  PRAPARE - Administrator, Civil Service (Medical): No    Lack of Transportation (Non-Medical): No  Physical Activity: Sufficiently Active (06/11/2022)   Exercise Vital Sign    Days of Exercise per Week: 5 days    Minutes of Exercise per Session: 40 min  Stress: No Stress Concern Present (06/11/2022)   [employer] of Occupational Health - Occupational Stress Questionnaire    Feeling of Stress : Not at all  Social Connections: Moderately Isolated (06/11/2022)   Social Connection and Isolation Panel [NHANES]    Frequency of Communication with Friends and Family: Three times a week    Frequency of Social Gatherings with Friends and Family: Twice a week    Attends Religious Services: Never    Database administrator or Organizations: No    Attends Engineer, structural: Not on file    Marital Status: Married  Catering manager Violence: Not At Risk (09/15/2022)   Humiliation, Afraid, Rape, and Kick questionnaire    Fear of Current or Ex-Partner: No    Emotionally Abused: No    Physically Abused: No    Sexually Abused: No    Physical Exam: Vital signs in last 24 hours: There were no vitals taken for this visit. GEN: NAD EYE: Sclerae anicteric ENT: MMM CV: Non-tachycardic Pulm: No increased WOB GI: Soft NEURO:  Alert & Oriented   Eulah Pont, MD [facility]   12/24/2022 1:44 PM

## 2022-12-24 NOTE — Progress Notes (Signed)
Pt's states no medical or surgical changes since previsit or office visit. 

## 2022-12-24 NOTE — Progress Notes (Signed)
Report given to PACU, vss 

## 2022-12-24 NOTE — Op Note (Signed)
[facility] Patient Name: Marissa Orozco Procedure Date: 12/24/2022 1:44 PM MRN: [id number] Endoscopist: Particia Lather , , [id number] Age: 77 Referring MD:  Date of Birth: Dec 18, 1945 Gender: Female Account #: [id number] Procedure:                Colonoscopy Indications:              Screening in patient at increased risk: Colorectal                            cancer in mother 40 or older, Screening in patient                            at increased risk: Colorectal cancer in father 70                            or older Medicines:                Monitored Anesthesia Care Procedure:                Pre-Anesthesia Assessment:                           - Prior to the procedure, a History and Physical                            was performed, and patient medications and                            allergies were reviewed. The patient's tolerance of                            previous anesthesia was also reviewed. The risks                            and benefits of the procedure and the sedation                            options and risks were discussed with the patient.                            All questions were answered, and informed consent                            was obtained. Prior Anticoagulants: The patient has                            taken no anticoagulant or antiplatelet agents. ASA                            Grade Assessment: II - A patient with mild systemic                            disease. After reviewing the risks and benefits,  the patient was deemed in satisfactory condition to                            undergo the procedure.                           After obtaining informed consent, the colonoscope                            was passed under direct vision. Throughout the                            procedure, the patient's blood pressure, pulse, and                            oxygen saturations were monitored  continuously. The                            CF [id number] #[id number] was introduced through the anus                            with the intention of advancing to the cecum. The                            scope was advanced to the transverse colon before                            the procedure was aborted. Medications were given.                            The colonoscopy was performed without difficulty.                            The patient tolerated the procedure well. The                            quality of the bowel preparation was poor. The                            rectum was photographed. Scope In: 2:47:54 PM Scope Out: 2:54:49 PM Total Procedure Duration: 0 hours 6 minutes 55 seconds  Findings:                 A large amount of solid stool was found in the                            rectum, in the sigmoid colon, in the descending                            colon and in the transverse colon, interfering with                            visualization.  Non-bleeding internal hemorrhoids were found during                            retroflexion. Complications:            No immediate complications. Estimated Blood Loss:     Estimated blood loss: none. Impression:               - Preparation of the colon was poor.                           - Stool in the rectum, in the sigmoid colon, in the                            descending colon and in the transverse colon.                           - Non-bleeding internal hemorrhoids.                           - No specimens collected. Recommendation:           - Discharge patient to home (with escort).                           - Repeat colonoscopy at the next available                            appointment with two day bowel preparation because                            the bowel preparation was poor.                           - The findings and recommendations were discussed                            with the  patient. Dr Particia Lather "Alan Ripper" Leonides Schanz,  12/24/2022 3:00:43 PM

## 2022-12-24 NOTE — Patient Instructions (Addendum)
-   Repeat colonoscopy at the next available appointment with two day bowel preparation because the bowel preparation was poor.  YOU HAD AN ENDOSCOPIC PROCEDURE TODAY AT THE [facility]:   Refer to the procedure report that was given to you for any specific questions about what was found during the examination.  If the procedure report does not answer your questions, please call your gastroenterologist to clarify.  If you requested that your care partner not be given the details of your procedure findings, then the procedure report has been included in a sealed envelope for you to review at your convenience later.  YOU SHOULD EXPECT: Some feelings of bloating in the abdomen. Passage of more gas than usual.  Walking can help get rid of the air that was put into your GI tract during the procedure and reduce the bloating. If you had a lower endoscopy (such as a colonoscopy or flexible sigmoidoscopy) you may notice spotting of blood in your stool or on the toilet paper. If you underwent a bowel prep for your procedure, you may not have a normal bowel movement for a few days.  Please Note:  You might notice some irritation and congestion in your nose or some drainage.  This is from the oxygen used during your procedure.  There is no need for concern and it should clear up in a day or so.  SYMPTOMS TO REPORT IMMEDIATELY:  Following lower endoscopy (colonoscopy or flexible sigmoidoscopy):  Excessive amounts of blood in the stool  Significant tenderness or worsening of abdominal pains  Swelling of the abdomen that is new, acute  Fever of 100F or higher  For urgent or emergent issues, a gastroenterologist can be reached at any hour by calling [phone]. Do not use MyChart messaging for urgent concerns.    DIET:  We do recommend a small meal at first, but then you may proceed to your regular diet.  Drink plenty of fluids but you should avoid alcoholic beverages for 24 hours.  ACTIVITY:   You should plan to take it easy for the rest of today and you should NOT DRIVE or use heavy machinery until tomorrow (because of the sedation medicines used during the test).    FOLLOW UP: Our staff will call the number listed on your records the next business day following your procedure.  We will call around 7:15- 8:00 am to check on you and address any questions or concerns that you may have regarding the information given to you following your procedure. If we do not reach you, we will leave a message.     If any biopsies were taken you will be contacted by phone or by letter within the next 1-3 weeks.  Please call us at [phone] if you have not heard about the biopsies in 3 weeks.    SIGNATURES/CONFIDENTIALITY: You and/or your care partner have signed paperwork which will be entered into your electronic medical record.  These signatures attest to the fact that that the information above on your After Visit Summary has been reviewed and is understood.  Full responsibility of the confidentiality of this discharge information lies with you and/or your care-partner.

## 2022-12-27 ENCOUNTER — Telehealth: Payer: Self-pay

## 2022-12-27 NOTE — Telephone Encounter (Signed)
  Follow up Call-     12/24/2022    2:04 PM  Call back number  Post procedure Call Back phone  # [phone]  Permission to leave phone message Yes     Patient questions:  Do you have a fever, pain , or abdominal swelling? No. Pain Score  0 *  Have you tolerated food without any problems? Yes.    Have you been able to return to your normal activities? Yes.    Do you have any questions about your discharge instructions: Diet   No. Medications  No. Follow up visit  No.  Do you have questions or concerns about your Care? No.  Actions: * If pain score is 4 or above: No action needed, pain <4.

## 2022-12-28 ENCOUNTER — Ambulatory Visit: Payer: Self-pay

## 2023-01-04 ENCOUNTER — Ambulatory Visit: Payer: Medicare Other

## 2023-01-04 VITALS — BP 124/80 | HR 53 | Temp 98.1°F | Ht 63.0 in | Wt 164.0 lb

## 2023-01-04 DIAGNOSIS — Z79899 Other long term (current) drug therapy: Secondary | ICD-10-CM

## 2023-01-04 DIAGNOSIS — I10 Essential (primary) hypertension: Secondary | ICD-10-CM

## 2023-01-04 NOTE — Progress Notes (Signed)
 Patient presents today for bpc. She currently takes Amlodipine  5MG  at night & Valsartan  80MG  in the morning. Denies headache, chest pain & sob. She admits exercising regularly 4-5 days a week for 30 mins. She reports she is working on increasing her intake of water. Initial bp: 136/80. Bp taken again after 10 minutes:  BP Readings from Last 3 Encounters:  01/04/23 124/80  12/24/22 (!) 149/73  10/25/22 134/78  Per provider patient is to follow a low sodium diet. She is to stay on current medication regimen. Patient aware, appointment scheduled for 6 week follow up. BMP completed today.

## 2023-01-04 NOTE — Patient Instructions (Signed)
 Hypertension, Adult Hypertension is another name for high blood pressure. High blood pressure forces your heart to work harder to pump blood. This can cause problems over time. There are two numbers in a blood pressure reading. There is a top number (systolic) over a bottom number (diastolic). It is best to have a blood pressure that is below 120/80. What are the causes? The cause of this condition is not known. Some other conditions can lead to high blood pressure. What increases the risk? Some lifestyle factors can make you more likely to develop high blood pressure: Smoking. Not getting enough exercise or physical activity. Being overweight. Having too much fat, sugar, calories, or salt (sodium) in your diet. Drinking too much alcohol. Other risk factors include: Having any of these conditions: Heart disease. Diabetes. High cholesterol. Kidney disease. Obstructive sleep apnea. Having a family history of high blood pressure and high cholesterol. Age. The risk increases with age. Stress. What are the signs or symptoms? High blood pressure may not cause symptoms. Very high blood pressure (hypertensive crisis) may cause: Headache. Fast or uneven heartbeats (palpitations). Shortness of breath. Nosebleed. Vomiting or feeling like you may vomit (nauseous). Changes in how you see. Very bad chest pain. Feeling dizzy. Seizures. How is this treated? This condition is treated by making healthy lifestyle changes, such as: Eating healthy foods. Exercising more. Drinking less alcohol. Your doctor may prescribe medicine if lifestyle changes do not help enough and if: Your top number is above 130. Your bottom number is above 80. Your personal target blood pressure may vary. Follow these instructions at home: Eating and drinking  If told, follow the DASH eating plan. To follow this plan: Fill one half of your plate at each meal with fruits and vegetables. Fill one fourth of your plate  at each meal with whole grains. Whole grains include whole-wheat pasta, brown rice, and whole-grain bread. Eat or drink low-fat dairy products, such as skim milk or low-fat yogurt. Fill one fourth of your plate at each meal with low-fat (lean) proteins. Low-fat proteins include fish, chicken without skin, eggs, beans, and tofu. Avoid fatty meat, cured and processed meat, or chicken with skin. Avoid pre-made or processed food. Limit the amount of salt in your diet to less than 1,500 mg each day. Do not drink alcohol if: Your doctor tells you not to drink. You are pregnant, may be pregnant, or are planning to become pregnant. If you drink alcohol: Limit how much you have to: 0-1 drink a day for women. 0-2 drinks a day for men. Know how much alcohol is in your drink. In the [place] one drink equals one 12 oz bottle of beer (355 mL), one 5 oz glass of wine (148 mL), or one 1 oz glass of hard liquor (44 mL). Lifestyle  Work with your doctor to stay at a healthy weight or to lose weight. Ask your doctor what the best weight is for you. Get at least 30 minutes of exercise that causes your heart to beat faster (aerobic exercise) most days of the week. This may include walking, swimming, or biking. Get at least 30 minutes of exercise that strengthens your muscles (resistance exercise) at least 3 days a week. This may include lifting weights or doing Pilates. Do not smoke or use any products that contain nicotine or tobacco. If you need help quitting, ask your doctor. Check your blood pressure at home as told by your doctor. Keep all follow-up visits. Medicines Take over-the-counter and prescription medicines  only as told by your doctor. Follow directions carefully. Do not skip doses of blood pressure medicine. The medicine does not work as well if you skip doses. Skipping doses also puts you at risk for problems. Ask your doctor about side effects or reactions to medicines that you should watch  for. Contact a doctor if: You think you are having a reaction to the medicine you are taking. You have headaches that keep coming back. You feel dizzy. You have swelling in your ankles. You have trouble with your vision. Get help right away if: You get a very bad headache. You start to feel mixed up (confused). You feel weak or numb. You feel faint. You have very bad pain in your: Chest. Belly (abdomen). You vomit more than once. You have trouble breathing. These symptoms may be an emergency. Get help right away. Call 911. Do not wait to see if the symptoms will go away. Do not drive yourself to the hospital. Summary Hypertension is another name for high blood pressure. High blood pressure forces your heart to work harder to pump blood. For most people, a normal blood pressure is less than 120/80. Making healthy choices can help lower blood pressure. If your blood pressure does not get lower with healthy choices, you may need to take medicine. This information is not intended to replace advice given to you by your health care provider. Make sure you discuss any questions you have with your health care provider. Document Revised: 10/09/2020 Document Reviewed: 10/09/2020 Elsevier Patient Education  2024 [employer].

## 2023-01-05 LAB — BMP8+EGFR
BUN/Creatinine Ratio: 18 (ref 12–28)
BUN: 12 mg/dL (ref 8–27)
CO2: 22 mmol/L (ref 20–29)
Calcium: 10 mg/dL (ref 8.7–10.3)
Chloride: 108 mmol/L — ABNORMAL HIGH (ref 96–106)
Creatinine, Ser: 0.66 mg/dL (ref 0.57–1.00)
Glucose: 101 mg/dL — ABNORMAL HIGH (ref 70–99)
Potassium: 5.2 mmol/L (ref 3.5–5.2)
Sodium: 144 mmol/L (ref 134–144)
eGFR: 90 mL/min/{1.73_m2} (ref >59)

## 2023-01-07 ENCOUNTER — Encounter: Payer: Self-pay | Admitting: Internal Medicine

## 2023-01-13 ENCOUNTER — Other Ambulatory Visit: Payer: Self-pay | Admitting: Gastroenterology

## 2023-01-13 DIAGNOSIS — Z800 Family history of malignant neoplasm of digestive organs: Secondary | ICD-10-CM

## 2023-01-13 DIAGNOSIS — Z860100 Personal history of colon polyps, unspecified: Secondary | ICD-10-CM

## 2023-01-13 NOTE — Telephone Encounter (Signed)
 Not sure if this patient needs this refilled or not? Looks like she had a poor prep with the first try. I think this was ordered under Dr Laurita Quint in error.

## 2023-01-16 ENCOUNTER — Encounter: Payer: Self-pay | Admitting: Internal Medicine

## 2023-01-17 ENCOUNTER — Other Ambulatory Visit: Payer: Self-pay

## 2023-01-17 MED ORDER — POLYETHYLENE GLYCOL 3350 17 GM/SCOOP PO POWD: ORAL | 0 refills | Status: DC

## 2023-01-17 MED ORDER — NA SULFATE-K SULFATE-MG SULF 17.5-3.13-1.6 GM/177ML PO SOLN: ORAL | 0 refills | Status: DC

## 2023-01-30 ENCOUNTER — Encounter: Payer: Self-pay | Admitting: Certified Registered Nurse Anesthetist

## 2023-02-02 ENCOUNTER — Ambulatory Visit: Payer: Medicare Other | Admitting: Internal Medicine

## 2023-02-02 ENCOUNTER — Encounter: Payer: Self-pay | Admitting: Internal Medicine

## 2023-02-02 VITALS — BP 171/85 | HR 59 | Temp 97.8°F | Resp 19 | Ht 63.0 in | Wt 164.0 lb

## 2023-02-02 DIAGNOSIS — D128 Benign neoplasm of rectum: Secondary | ICD-10-CM

## 2023-02-02 DIAGNOSIS — K648 Other hemorrhoids: Secondary | ICD-10-CM | POA: Diagnosis not present

## 2023-02-02 DIAGNOSIS — Z1211 Encounter for screening for malignant neoplasm of colon: Secondary | ICD-10-CM

## 2023-02-02 DIAGNOSIS — D122 Benign neoplasm of ascending colon: Secondary | ICD-10-CM | POA: Diagnosis not present

## 2023-02-02 DIAGNOSIS — Z800 Family history of malignant neoplasm of digestive organs: Secondary | ICD-10-CM | POA: Diagnosis not present

## 2023-02-02 DIAGNOSIS — E785 Hyperlipidemia, unspecified: Secondary | ICD-10-CM | POA: Diagnosis not present

## 2023-02-02 DIAGNOSIS — D123 Benign neoplasm of transverse colon: Secondary | ICD-10-CM | POA: Diagnosis not present

## 2023-02-02 DIAGNOSIS — Z860100 Personal history of colon polyps, unspecified: Secondary | ICD-10-CM

## 2023-02-02 DIAGNOSIS — I10 Essential (primary) hypertension: Secondary | ICD-10-CM | POA: Diagnosis not present

## 2023-02-02 DIAGNOSIS — G4733 Obstructive sleep apnea (adult) (pediatric): Secondary | ICD-10-CM | POA: Diagnosis not present

## 2023-02-02 MED ORDER — SODIUM CHLORIDE 0.9 % IV SOLN: 500.0000 mL | Freq: Once | INTRAVENOUS | Status: DC

## 2023-02-02 NOTE — Op Note (Signed)
[facility] Patient Name: Traniya Prichett Procedure Date: 02/02/2023 4:13 PM MRN: [id number] Endoscopist: Particia Lather , , [id number] Age: 78 Referring MD:  Date of Birth: 06/03/1945 Gender: Female Account #: [id number] Procedure:                Colonoscopy Indications:              Screening patient at increased risk: Family history                            of colorectal cancer in multiple 1st-degree                            relatives Medicines:                Monitored Anesthesia Care Procedure:                Pre-Anesthesia Assessment:                           - Prior to the procedure, a History and Physical                            was performed, and patient medications and                            allergies were reviewed. The patient's tolerance of                            previous anesthesia was also reviewed. The risks                            and benefits of the procedure and the sedation                            options and risks were discussed with the patient.                            All questions were answered, and informed consent                            was obtained. Prior Anticoagulants: The patient has                            taken no anticoagulant or antiplatelet agents. ASA                            Grade Assessment: II - A patient with mild systemic                            disease. After reviewing the risks and benefits,                            the patient was deemed in satisfactory condition to  undergo the procedure.                           After obtaining informed consent, the colonoscope                            was passed under direct vision. Throughout the                            procedure, the patient's blood pressure, pulse, and                            oxygen saturations were monitored continuously. The                            Olympus Scope SN [id number] was introduced  through the                            anus and advanced to the the terminal ileum. The                            colonoscopy was performed without difficulty. The                            patient tolerated the procedure well. The quality                            of the bowel preparation was good. The terminal                            ileum, ileocecal valve, appendiceal orifice, and                            rectum were photographed. Scope In: 4:19:06 PM Scope Out: 4:38:26 PM Scope Withdrawal Time: 0 hours 15 minutes 2 seconds  Total Procedure Duration: 0 hours 19 minutes 20 seconds  Findings:                 The terminal ileum appeared normal.                           Three sessile polyps were found in the transverse                            colon and ascending colon. The polyps were 3 to 5                            mm in size. These polyps were removed with a cold                            snare. Resection and retrieval were complete.                           A 3 mm polyp was found in the rectum. The  polyp was                            sessile. The polyp was removed with a cold snare.                            Resection and retrieval were complete.                           Non-bleeding internal hemorrhoids were found during                            retroflexion. Complications:            No immediate complications. Estimated Blood Loss:     Estimated blood loss was minimal. Impression:               - The examined portion of the ileum was normal.                           - Three 3 to 5 mm polyps in the transverse colon                            and in the ascending colon, removed with a cold                            snare. Resected and retrieved.                           - One 3 mm polyp in the rectum, removed with a cold                            snare. Resected and retrieved.                           - Non-bleeding internal hemorrhoids. Recommendation:            - Discharge patient to home (with escort).                           - Await pathology results.                           - The findings and recommendations were discussed                            with the patient. Dr Particia Lather "Alan Ripper" Leonides Schanz,  02/02/2023 4:44:05 PM

## 2023-02-02 NOTE — Progress Notes (Signed)
GASTROENTEROLOGY PROCEDURE H&P NOTE   Primary Care Physician: Dorothyann Peng, MD    Reason for Procedure:   Inadequate prep, family history of colon cancer, history of colon polyps  Plan:    Colonoscopy  Patient is appropriate for endoscopic procedure(s) in the ambulatory ([facility]) setting.  The nature of the procedure, as well as the risks, benefits, and alternatives were carefully and thoroughly reviewed with the patient. Ample time for discussion and questions allowed. The patient understood, was satisfied, and agreed to proceed.     HPI: Marissa Orozco is a 78 y.o. female who presents for colonoscopy for inadequate prep on last colonoscopy and family history of colon cancer, history of colon polyps. Father had colon cancer at age 65 and mother at age 54.  Past Medical History:  Diagnosis Date   Allergy 2000   Breast lump    Cataract    Colon polyps    Fibroids    GERD (gastroesophageal reflux disease)    Glaucoma    Heart murmur    Hyperlipidemia 01/05/1984   Hypertension 01/05/1984   Interstitial cystitis 01/04/1997   Kidney stones 01/05/2003   Neuromuscular disorder (HCC)    Osteoporosis    Preop cardiovascular exam 10/25/2022   Sleep apnea 2021   Tonsillitis     Past Surgical History:  Procedure Laterality Date   ABDOMINAL HYSTERECTOMY  1997   BREAST EXCISIONAL BIOPSY Right 07/27/2006   Intraductal Papilloma   BREAST SURGERY  2008   CATARACT EXTRACTION Bilateral 02/2020   other in 05/2020   COLONOSCOPY  2010   Dr Loreta [address]   EYE SURGERY  2022   HYSTEROSCOPY  1993   LAPAROSCOPIC HYSTERECTOMY  1994   MYOMECTOMY  1993   excision of tumor   POLYPECTOMY     TONSILLECTOMY  1953   TUBAL LIGATION  1980    Prior to Admission medications   Medication Sig Start Date End Date Taking? Authorizing Provider  amLODipine (NORVASC) 5 MG tablet TAKE 1 TABLET BY MOUTH DAILY 11/22/22   Dorothyann Peng, MD  aspirin EC 81 MG tablet Take 1 tablet (81 mg total) by mouth  daily. Swallow whole. 02/09/22 05/05/23  Dorothyann Peng, MD  atorvastatin (LIPITOR) 80 MG tablet TAKE 1 TABLET BY MOUTH DAILY 09/08/22   Dorothyann Peng, MD  CALCIUM PO Take 1 tablet by mouth daily. 500 mg daily    [provider]  Cholecalciferol (VITAMIN D PO) Take 1 tablet by mouth daily. 2000 units daily    [provider]  dorzolamide-timolol (COSOPT) 22.3-6.8 MG/ML ophthalmic solution  02/02/20   [provider]  ezetimibe (ZETIA) 10 MG tablet Take 1 tablet (10 mg total) by mouth daily. 03/22/22   Dorothyann Peng, MD  finasteride (PROSCAR) 5 MG tablet Take 5 mg by mouth daily.    [provider]  latanoprost (XALATAN) 0.005 % ophthalmic solution Place 1 drop into both eyes daily. 03/17/13   [provider]  Na Sulfate-K Sulfate-Mg Sulf 17.5-3.13-1.6 GM/177ML SOLN Take as directed by physician for colonoscopy prep 01/17/23   Imogene Burn, MD  polyethylene glycol powder [facility]) 17 GM/SCOOP powder Take as directed for colonoscopy prep 01/17/23   Imogene Burn, MD  valsartan (DIOVAN) 80 MG tablet Take 1 tablet (80 mg total) by mouth daily. 12/20/22   Dorothyann Peng, MD    Current Outpatient Medications  Medication Sig Dispense Refill   amLODipine (NORVASC) 5 MG tablet TAKE 1 TABLET BY MOUTH DAILY 90 tablet 3  aspirin EC 81 MG tablet Take 1 tablet (81 mg total) by mouth daily. Swallow whole. 150 tablet 2   atorvastatin (LIPITOR) 80 MG tablet TAKE 1 TABLET BY MOUTH DAILY 90 tablet 3   CALCIUM PO Take 1 tablet by mouth daily. 500 mg daily     Cholecalciferol (VITAMIN D PO) Take 1 tablet by mouth daily. 2000 units daily     dorzolamide-timolol (COSOPT) 22.3-6.8 MG/ML ophthalmic solution      ezetimibe (ZETIA) 10 MG tablet Take 1 tablet (10 mg total) by mouth daily. 90 tablet 3   finasteride (PROSCAR) 5 MG tablet Take 5 mg by mouth daily.     latanoprost (XALATAN) 0.005 % ophthalmic solution Place 1 drop into both eyes daily.     valsartan (DIOVAN)  80 MG tablet Take 1 tablet (80 mg total) by mouth daily. 90 tablet 0   Current Facility-Administered Medications  Medication Dose Route Frequency Provider Last Rate Last Admin   0.9 %  sodium chloride infusion  500 mL Intravenous Once Imogene Burn, MD        Allergies as of 02/02/2023 - Review Complete 02/02/2023  Allergen Reaction Noted   Hctz [hydrochlorothiazide] Itching 05/29/2010    Family History  Problem Relation Age of Onset   Hypertension Mother    Heart disease Mother    Colon cancer Mother 59   Arthritis Mother    Cancer Mother    Coronary artery disease Father        mother, PGM   Stroke Father    Hypertension Father    Colon cancer Father 49   Cancer Father    Colon cancer Maternal Grandmother    Diabetes Other    Breast cancer Neg Hx    Colon polyps Neg Hx    Esophageal cancer Neg Hx    Stomach cancer Neg Hx    Rectal cancer Neg Hx     Social History   Socioeconomic History   Marital status: Married    Spouse name: Not on file   Number of children: 2   Years of education: Not on file   Highest education level: Doctorate  Occupational History   Occupation: retired   Occupation: retired  Tobacco Use   Smoking status: Never   Smokeless tobacco: Never  Vaping Use   Vaping status: Never Used  Substance and Sexual Activity   Alcohol use: Yes    Alcohol/week: 5.0 standard drinks of alcohol    Types: 5 Glasses of wine per week   Drug use: No   Sexual activity: Not Currently  Other Topics Concern   Not on file  Social History Narrative   Not on file   Social Drivers of Health   Financial Resource Strain: Low Risk  (06/11/2022)   Overall Financial Resource Strain (CARDIA)    Difficulty of Paying Living Expenses: Not hard at all  Food Insecurity: No Food Insecurity (06/11/2022)   Hunger Vital Sign    Worried About Running Out of Food in the Last Year: Never true    Ran Out of Food in the Last Year: Never true  Transportation Needs: No  Transportation Needs (06/11/2022)   PRAPARE - Administrator, Civil Service (Medical): No    Lack of Transportation (Non-Medical): No  Physical Activity: Sufficiently Active (06/11/2022)   Exercise Vital Sign    Days of Exercise per Week: 5 days    Minutes of Exercise per Session: 40 min  Stress: No Stress Concern Present (  06/11/2022)   [employer] [employer] - Occupational Stress Questionnaire    Feeling of Stress : Not at all  Social Connections: Moderately Isolated (06/11/2022)   Social Connection and Isolation Panel [NHANES]    Frequency of Communication with Friends and Family: Three times a week    Frequency of Social Gatherings with Friends and Family: Twice a week    Attends Religious Services: Never    Database administrator or Organizations: No    Attends Engineer, structural: Not on file    Marital Status: Married  Catering manager Violence: Not At Risk (09/15/2022)   Humiliation, Afraid, Rape, and Kick questionnaire    Fear of Current or Ex-Partner: No    Emotionally Abused: No    Physically Abused: No    Sexually Abused: No    Physical Exam: Vital signs in last 24 hours: BP (!) 181/78   Pulse (!) 53   Temp 97.8 F (36.6 C) (Temporal)   Ht 5\' 3"  (1.6 m)   Wt 164 lb (74.4 kg)   SpO2 98%   BMI 29.05 kg/m  GEN: NAD EYE: Sclerae anicteric ENT: MMM CV: Non-tachycardic Pulm: No increased work of breathing GI: Soft, NT/ND NEURO:  Alert & Oriented   Eulah Pont, MD [facility]  02/02/2023 3:57 PM

## 2023-02-02 NOTE — Patient Instructions (Signed)
-  Handout on polyps, and hemorrhoids provided -await pathology results -repeat colonoscopy for surveillance recommended. Date to be determined when pathology result become available   -Continue present medications   YOU HAD AN ENDOSCOPIC PROCEDURE TODAY AT THE [facility]:   Refer to the procedure report that was given to you for any specific questions about what was found during the examination.  If the procedure report does not answer your questions, please call your gastroenterologist to clarify.  If you requested that your care partner not be given the details of your procedure findings, then the procedure report has been included in a sealed envelope for you to review at your convenience later.  YOU SHOULD EXPECT: Some feelings of bloating in the abdomen. Passage of more gas than usual.  Walking can help get rid of the air that was put into your GI tract during the procedure and reduce the bloating. If you had a lower endoscopy (such as a colonoscopy or flexible sigmoidoscopy) you may notice spotting of blood in your stool or on the toilet paper. If you underwent a bowel prep for your procedure, you may not have a normal bowel movement for a few days.  Please Note:  You might notice some irritation and congestion in your nose or some drainage.  This is from the oxygen used during your procedure.  There is no need for concern and it should clear up in a day or so.  SYMPTOMS TO REPORT IMMEDIATELY:  Following lower endoscopy (colonoscopy or flexible sigmoidoscopy):  Excessive amounts of blood in the stool  Significant tenderness or worsening of abdominal pains  Swelling of the abdomen that is new, acute  Fever of 100F or higher   For urgent or emergent issues, a gastroenterologist can be reached at any hour by calling [phone]. Do not use MyChart messaging for urgent concerns.    DIET:  We do recommend a small meal at first, but then you may proceed to your regular diet.   Drink plenty of fluids but you should avoid alcoholic beverages for 24 hours.  ACTIVITY:  You should plan to take it easy for the rest of today and you should NOT DRIVE or use heavy machinery until tomorrow (because of the sedation medicines used during the test).    FOLLOW UP: Our staff will call the number listed on your records the next business day following your procedure.  We will call around 7:15- 8:00 am to check on you and address any questions or concerns that you may have regarding the information given to you following your procedure. If we do not reach you, we will leave a message.     If any biopsies were taken you will be contacted by phone or by letter within the next 1-3 weeks.  Please call us at [phone] if you have not heard about the biopsies in 3 weeks.    SIGNATURES/CONFIDENTIALITY: You and/or your care partner have signed paperwork which will be entered into your electronic medical record.  These signatures attest to the fact that that the information above on your After Visit Summary has been reviewed and is understood.  Full responsibility of the confidentiality of this discharge information lies with you and/or your care-partner.

## 2023-02-02 NOTE — Progress Notes (Signed)
Pt's states no medical or surgical changes since previsit or office visit.

## 2023-02-02 NOTE — Progress Notes (Signed)
Called to room to assist during endoscopic procedure.  Patient ID and intended procedure confirmed with present staff. Received instructions for my participation in the procedure from the performing physician.

## 2023-02-02 NOTE — Progress Notes (Signed)
Report given to PACU, vss

## 2023-02-03 ENCOUNTER — Telehealth: Payer: Self-pay

## 2023-02-03 NOTE — Telephone Encounter (Signed)
  Follow up Call-     02/02/2023    2:50 PM 12/24/2022    2:04 PM  Call back number  Post procedure Call Back phone  # [phone]  Permission to leave phone message Yes Yes     Patient questions:  Do you have a fever, pain , or abdominal swelling? No. Pain Score  0 *  Have you tolerated food without any problems? Yes.    Have you been able to return to your normal activities? Yes.    Do you have any questions about your discharge instructions: Diet   No. Medications  No. Follow up visit  No.  Do you have questions or concerns about your Care? No.  Actions: * If pain score is 4 or above: No action needed, pain <4.

## 2023-02-06 ENCOUNTER — Encounter: Payer: Self-pay | Admitting: Internal Medicine

## 2023-02-08 LAB — SURGICAL PATHOLOGY

## 2023-02-10 ENCOUNTER — Encounter: Payer: Self-pay | Admitting: Internal Medicine

## 2023-02-15 NOTE — Patient Instructions (Incomplete)
Hypertension, Adult Hypertension is another name for high blood pressure. High blood pressure forces your heart to work harder to pump blood. This can cause problems over time. There are two numbers in a blood pressure reading. There is a top number (systolic) over a bottom number (diastolic). It is best to have a blood pressure that is below 120/80. What are the causes? The cause of this condition is not known. Some other conditions can lead to high blood pressure. What increases the risk? Some lifestyle factors can make you more likely to develop high blood pressure: Smoking. Not getting enough exercise or physical activity. Being overweight. Having too much fat, sugar, calories, or salt (sodium) in your diet. Drinking too much alcohol. Other risk factors include: Having any of these conditions: Heart disease. Diabetes. High cholesterol. Kidney disease. Obstructive sleep apnea. Having a family history of high blood pressure and high cholesterol. Age. The risk increases with age. Stress. What are the signs or symptoms? High blood pressure may not cause symptoms. Very high blood pressure (hypertensive crisis) may cause: Headache. Fast or uneven heartbeats (palpitations). Shortness of breath. Nosebleed. Vomiting or feeling like you may vomit (nauseous). Changes in how you see. Very bad chest pain. Feeling dizzy. Seizures. How is this treated? This condition is treated by making healthy lifestyle changes, such as: Eating healthy foods. Exercising more. Drinking less alcohol. Your doctor may prescribe medicine if lifestyle changes do not help enough and if: Your top number is above 130. Your bottom number is above 80. Your personal target blood pressure may vary. Follow these instructions at home: Eating and drinking  If told, follow the DASH eating plan. To follow this plan: Fill one half of your plate at each meal with fruits and vegetables. Fill one fourth of your plate  at each meal with whole grains. Whole grains include whole-wheat pasta, brown rice, and whole-grain bread. Eat or drink low-fat dairy products, such as skim milk or low-fat yogurt. Fill one fourth of your plate at each meal with low-fat (lean) proteins. Low-fat proteins include fish, chicken without skin, eggs, beans, and tofu. Avoid fatty meat, cured and processed meat, or chicken with skin. Avoid pre-made or processed food. Limit the amount of salt in your diet to less than 1,500 mg each day. Do not drink alcohol if: Your doctor tells you not to drink. You are pregnant, may be pregnant, or are planning to become pregnant. If you drink alcohol: Limit how much you have to: 0-1 drink a day for women. 0-2 drinks a day for men. Know how much alcohol is in your drink. In the [place] one drink equals one 12 oz bottle of beer (355 mL), one 5 oz glass of wine (148 mL), or one 1 oz glass of hard liquor (44 mL). Lifestyle  Work with your doctor to stay at a healthy weight or to lose weight. Ask your doctor what the best weight is for you. Get at least 30 minutes of exercise that causes your heart to beat faster (aerobic exercise) most days of the week. This may include walking, swimming, or biking. Get at least 30 minutes of exercise that strengthens your muscles (resistance exercise) at least 3 days a week. This may include lifting weights or doing Pilates. Do not smoke or use any products that contain nicotine or tobacco. If you need help quitting, ask your doctor. Check your blood pressure at home as told by your doctor. Keep all follow-up visits. Medicines Take over-the-counter and prescription medicines  only as told by your doctor. Follow directions carefully. Do not skip doses of blood pressure medicine. The medicine does not work as well if you skip doses. Skipping doses also puts you at risk for problems. Ask your doctor about side effects or reactions to medicines that you should watch  for. Contact a doctor if: You think you are having a reaction to the medicine you are taking. You have headaches that keep coming back. You feel dizzy. You have swelling in your ankles. You have trouble with your vision. Get help right away if: You get a very bad headache. You start to feel mixed up (confused). You feel weak or numb. You feel faint. You have very bad pain in your: Chest. Belly (abdomen). You vomit more than once. You have trouble breathing. These symptoms may be an emergency. Get help right away. Call 911. Do not wait to see if the symptoms will go away. Do not drive yourself to the hospital. Summary Hypertension is another name for high blood pressure. High blood pressure forces your heart to work harder to pump blood. For most people, a normal blood pressure is less than 120/80. Making healthy choices can help lower blood pressure. If your blood pressure does not get lower with healthy choices, you may need to take medicine. This information is not intended to replace advice given to you by your health care provider. Make sure you discuss any questions you have with your health care provider. Document Revised: 10/09/2020 Document Reviewed: 10/09/2020 Elsevier Patient Education  2024 [employer].

## 2023-02-15 NOTE — Progress Notes (Signed)
I,Victoria T Deloria Lair, CMA,acting as a Neurosurgeon for Gwynneth Aliment, MD.,have documented all relevant documentation on the behalf of Gwynneth Aliment, MD,as directed by  Gwynneth Aliment, MD while in the presence of Gwynneth Aliment, MD.  Subjective:  Patient ID: Marissa Orozco , female    DOB: [birth year] , 78 y.o.   MRN: [id number]  Chief Complaint  Patient presents with   Hypertension   Hyperlipidemia    HPI  The patient is here for a follow-up on her blood pressure & cholesterol today. She reports compliance with meds. Denies headaches, chest pain and shortness of breath. Denies any specific questions or concerns.     Hypertension This is a chronic problem. The current episode started more than 1 year ago. The problem is controlled. Pertinent negatives include no blurred vision, chest pain, palpitations or shortness of breath. Risk factors for coronary artery disease include dyslipidemia and post-menopausal state. Past treatments include ACE inhibitors. The current treatment provides moderate improvement.     Past Medical History:  Diagnosis Date   Allergy 2000   Breast lump    Cataract    Colon polyps    Fibroids    GERD (gastroesophageal reflux disease)    Glaucoma    Heart murmur    Hyperlipidemia 01/05/1984   Hypertension 01/05/1984   Interstitial cystitis 01/04/1997   Kidney stones 01/05/2003   Neuromuscular disorder (HCC)    Osteoporosis    Preop cardiovascular exam 10/25/2022   Sleep apnea 2021   Tonsillitis      Family History  Problem Relation Age of Onset   Hypertension Mother    Heart disease Mother    Colon cancer Mother 13   Arthritis Mother    Cancer Mother    Coronary artery disease Father        mother, PGM   Stroke Father    Hypertension Father    Colon cancer Father 70   Cancer Father    Colon cancer Maternal Grandmother    Diabetes Other    Breast cancer Neg Hx    Colon polyps Neg Hx    Esophageal cancer Neg Hx    Stomach cancer Neg Hx     Rectal cancer Neg Hx      Current Outpatient Medications:    valsartan (DIOVAN) 160 MG tablet, Take 1 tablet (160 mg total) by mouth daily., Disp: 90 tablet, Rfl: 3   amLODipine (NORVASC) 5 MG tablet, TAKE 1 TABLET BY MOUTH DAILY, Disp: 90 tablet, Rfl: 3   aspirin EC 81 MG tablet, Take 1 tablet (81 mg total) by mouth daily. Swallow whole., Disp: 150 tablet, Rfl: 2   atorvastatin (LIPITOR) 80 MG tablet, TAKE 1 TABLET BY MOUTH DAILY, Disp: 90 tablet, Rfl: 3   CALCIUM PO, Take 1 tablet by mouth daily. 500 mg daily, Disp: , Rfl:    Cholecalciferol (VITAMIN D PO), Take 1 tablet by mouth daily. 2000 units daily, Disp: , Rfl:    dorzolamide-timolol (COSOPT) 22.3-6.8 MG/ML ophthalmic solution, , Disp: , Rfl:    ezetimibe (ZETIA) 10 MG tablet, Take 1 tablet (10 mg total) by mouth daily., Disp: 90 tablet, Rfl: 3   finasteride (PROSCAR) 5 MG tablet, Take 5 mg by mouth daily., Disp: , Rfl:    latanoprost (XALATAN) 0.005 % ophthalmic solution, Place 1 drop into both eyes daily., Disp: , Rfl:    Allergies  Allergen Reactions   Hctz [Hydrochlorothiazide] Itching     Review of Systems  Constitutional: Negative.  Eyes:  Negative for blurred vision.  Respiratory: Negative.  Negative for shortness of breath.   Cardiovascular: Negative.  Negative for chest pain and palpitations.  Gastrointestinal: Negative.   Neurological: Negative.   Psychiatric/Behavioral: Negative.       Today's Vitals   02/16/23 1030  BP: 128/80  Pulse: (!) 51  Temp: 97.8 F (36.6 C)  SpO2: 98%  Weight: 169 lb (76.7 kg)  Height: 5\' 3"  (1.6 m)   Body mass index is 29.94 kg/m.  Wt Readings from Last 3 Encounters:  02/16/23 169 lb (76.7 kg)  02/02/23 164 lb (74.4 kg)  01/04/23 164 lb (74.4 kg)     Objective:  Physical Exam Vitals and nursing note reviewed.  Constitutional:      Appearance: Normal appearance.  HENT:     Head: Normocephalic and atraumatic.  Eyes:     Extraocular Movements: Extraocular movements  intact.  Cardiovascular:     Rate and Rhythm: Normal rate and regular rhythm.     Heart sounds: Normal heart sounds.  Pulmonary:     Effort: Pulmonary effort is normal.     Breath sounds: Normal breath sounds.  Skin:    General: Skin is warm.  Neurological:     General: No focal deficit present.     Mental Status: She is alert.  Psychiatric:        Mood and Affect: Mood normal.        Behavior: Behavior normal.         Assessment And Plan:  Hypertensive heart disease without heart failure Assessment & Plan: Chronic, controlled.  She will continue with valsartan 160mg  daily and amlodipine 5mg  daily. She is encouraged to follow low sodium diet.   Orders: -     CMP14+EGFR -     Lipid panel  Atherosclerosis of both carotid arteries Assessment & Plan: Chronic, target LDL should be less than 70 based on carotid and coronary plaque. She will continue with atorvastatin 80mg  and ezetimibe 10mg  daily.  Encouraged to follow heart healthy lifestyle.    Pure hypercholesterolemia Assessment & Plan: Chronic, LDL goal is less than 70.  She will continue with atorvastatin and ezetimibe.   Orders: -     CMP14+EGFR -     Lipid panel  Other abnormal glucose Assessment & Plan: Previous labs reviewed, her A1c has been elevated in the past. I will check an A1c today. Reminded to avoid refined sugars including sugary drinks/foods and processed meats including bacon, sausages and deli meats.    Orders: -     CMP14+EGFR -     Hemoglobin A1c  Other orders -     Valsartan; Take 1 tablet (160 mg total) by mouth daily.  Dispense: 90 tablet; Refill: 3 -     Ezetimibe; Take 1 tablet (10 mg total) by mouth daily.  Dispense: 90 tablet; Refill: 3     Return cancel March, move to May.  Patient was given opportunity to ask questions. Patient verbalized understanding of the plan and was able to repeat key elements of the plan. All questions were answered to their satisfaction.    I, Gwynneth Aliment, MD, have reviewed all documentation for this visit. The documentation on 02/16/23 for the exam, diagnosis, procedures, and orders are all accurate and complete.   IF YOU HAVE BEEN REFERRED TO A SPECIALIST, IT MAY TAKE 1-2 WEEKS TO SCHEDULE/PROCESS THE REFERRAL. IF YOU HAVE NOT HEARD FROM US/SPECIALIST IN TWO WEEKS, PLEASE GIVE [place] A CALL AT  [phone].   THE PATIENT IS ENCOURAGED TO PRACTICE SOCIAL DISTANCING DUE TO THE COVID-19 PANDEMIC.

## 2023-02-16 ENCOUNTER — Encounter: Payer: Self-pay | Admitting: Internal Medicine

## 2023-02-16 ENCOUNTER — Ambulatory Visit (INDEPENDENT_AMBULATORY_CARE_PROVIDER_SITE_OTHER): Payer: Medicare Other | Admitting: Internal Medicine

## 2023-02-16 VITALS — BP 128/80 | HR 51 | Temp 97.8°F | Ht 63.0 in | Wt 169.0 lb

## 2023-02-16 DIAGNOSIS — E7800 Pure hypercholesterolemia, unspecified: Secondary | ICD-10-CM

## 2023-02-16 DIAGNOSIS — R7309 Other abnormal glucose: Secondary | ICD-10-CM

## 2023-02-16 DIAGNOSIS — I10 Essential (primary) hypertension: Secondary | ICD-10-CM | POA: Diagnosis not present

## 2023-02-16 DIAGNOSIS — I119 Hypertensive heart disease without heart failure: Secondary | ICD-10-CM

## 2023-02-16 DIAGNOSIS — I6523 Occlusion and stenosis of bilateral carotid arteries: Secondary | ICD-10-CM

## 2023-02-16 LAB — CMP14+EGFR
ALT: 30 [IU]/L (ref 0–32)
AST: 30 [IU]/L (ref 0–40)
Albumin: 4 g/dL (ref 3.8–4.8)
Alkaline Phosphatase: 104 [IU]/L (ref 44–121)
BUN/Creatinine Ratio: 17 (ref 12–28)
BUN: 12 mg/dL (ref 8–27)
Bilirubin Total: 1.2 mg/dL (ref 0.0–1.2)
CO2: 20 mmol/L (ref 20–29)
Calcium: 9.8 mg/dL (ref 8.7–10.3)
Chloride: 108 mmol/L — ABNORMAL HIGH (ref 96–106)
Creatinine, Ser: 0.72 mg/dL (ref 0.57–1.00)
Globulin, Total: 2.4 g/dL (ref 1.5–4.5)
Glucose: 95 mg/dL (ref 70–99)
Potassium: 5 mmol/L (ref 3.5–5.2)
Sodium: 142 mmol/L (ref 134–144)
Total Protein: 6.4 g/dL (ref 6.0–8.5)
eGFR: 85 mL/min/{1.73_m2} (ref >59)

## 2023-02-16 LAB — LIPID PANEL
Chol/HDL Ratio: 2.8 {ratio} (ref 0.0–4.4)
Cholesterol, Total: 149 mg/dL (ref 100–199)
HDL: 53 mg/dL (ref >39)
LDL Chol Calc (NIH): 79 mg/dL (ref 0–99)
Triglycerides: 90 mg/dL (ref 0–149)
VLDL Cholesterol Cal: 17 mg/dL (ref 5–40)

## 2023-02-16 LAB — HEMOGLOBIN A1C
Est. average glucose Bld gHb Est-mCnc: 131 mg/dL
Hgb A1c MFr Bld: 6.2 % — ABNORMAL HIGH (ref 4.8–5.6)

## 2023-02-16 MED ORDER — EZETIMIBE 10 MG PO TABS: 10.0000 mg | ORAL_TABLET | Freq: Every day | ORAL | 3 refills | Status: AC

## 2023-02-16 MED ORDER — VALSARTAN 160 MG PO TABS: 160.0000 mg | ORAL_TABLET | Freq: Every day | ORAL | 3 refills | Status: DC

## 2023-02-19 DIAGNOSIS — E7800 Pure hypercholesterolemia, unspecified: Secondary | ICD-10-CM | POA: Insufficient documentation

## 2023-02-19 NOTE — Assessment & Plan Note (Signed)
Chronic, controlled.  She will continue with valsartan 160mg  daily and amlodipine 5mg  daily. She is encouraged to follow low sodium diet.

## 2023-02-19 NOTE — Assessment & Plan Note (Addendum)
Chronic, target LDL should be less than 70 based on carotid and coronary plaque. She will continue with atorvastatin 80mg  and ezetimibe 10mg  daily.  Encouraged to follow heart healthy lifestyle.

## 2023-02-19 NOTE — Assessment & Plan Note (Signed)
 Previous labs reviewed, her A1c has been elevated in the past. I will check an A1c today. Reminded to avoid refined sugars including sugary drinks/foods and processed meats including bacon, sausages and deli meats.

## 2023-02-19 NOTE — Assessment & Plan Note (Signed)
Chronic, LDL goal is less than 70.  She will continue with atorvastatin and ezetimibe.

## 2023-03-16 ENCOUNTER — Ambulatory Visit: Payer: Medicare Other | Admitting: Internal Medicine

## 2023-04-11 DIAGNOSIS — H26491 Other secondary cataract, right eye: Secondary | ICD-10-CM | POA: Diagnosis not present

## 2023-04-11 DIAGNOSIS — H401131 Primary open-angle glaucoma, bilateral, mild stage: Secondary | ICD-10-CM | POA: Diagnosis not present

## 2023-04-12 ENCOUNTER — Other Ambulatory Visit: Payer: Self-pay | Admitting: Internal Medicine

## 2023-04-12 DIAGNOSIS — Z1231 Encounter for screening mammogram for malignant neoplasm of breast: Secondary | ICD-10-CM

## 2023-05-05 ENCOUNTER — Ambulatory Visit

## 2023-05-25 ENCOUNTER — Ambulatory Visit
Admission: RE | Admit: 2023-05-25 | Discharge: 2023-05-25 | Disposition: A | Discharge status: Home / Self Care | Source: Ambulatory Visit | Attending: Internal Medicine | Admitting: Internal Medicine

## 2023-05-25 DIAGNOSIS — Z1231 Encounter for screening mammogram for malignant neoplasm of breast: Secondary | ICD-10-CM | POA: Diagnosis not present

## 2023-06-02 ENCOUNTER — Encounter: Payer: Self-pay | Admitting: Internal Medicine

## 2023-06-02 ENCOUNTER — Ambulatory Visit: Payer: Medicare Other | Admitting: Internal Medicine

## 2023-06-02 VITALS — BP 110/80 | HR 64 | Temp 98.4°F | Ht 63.0 in | Wt 170.8 lb

## 2023-06-02 DIAGNOSIS — I6523 Occlusion and stenosis of bilateral carotid arteries: Secondary | ICD-10-CM

## 2023-06-02 DIAGNOSIS — R7309 Other abnormal glucose: Secondary | ICD-10-CM

## 2023-06-02 DIAGNOSIS — I119 Hypertensive heart disease without heart failure: Secondary | ICD-10-CM | POA: Diagnosis not present

## 2023-06-02 DIAGNOSIS — E7800 Pure hypercholesterolemia, unspecified: Secondary | ICD-10-CM

## 2023-06-02 DIAGNOSIS — Z79899 Other long term (current) drug therapy: Secondary | ICD-10-CM | POA: Diagnosis not present

## 2023-06-02 DIAGNOSIS — I10 Essential (primary) hypertension: Secondary | ICD-10-CM | POA: Diagnosis not present

## 2023-06-02 NOTE — Progress Notes (Signed)
 I,Jameka J Llittleton, CMA,acting as a Neurosurgeon for Smiley Dung, MD.,have documented all relevant documentation on the behalf of Smiley Dung, MD,as directed by  Smiley Dung, MD while in the presence of Smiley Dung, MD.  Subjective:  Patient ID: Marissa Orozco , female    DOB: [birth year] , 78 y.o.   MRN: [id number]  Chief Complaint  Patient presents with   Hypertension    Patient presents today for a BP Check. Patient doesn't have any specific questions or concerns. Patient denies headache, chest pain & sob.    HPI Discussed the use of AI scribe software for clinical note transcription with the patient, who gave verbal consent to proceed.  History of Present Illness Marissa Orozco is a 78 year old female with hypertension and high cholesterol who presents for a blood pressure check.  She has been monitoring her blood pressure at home, noting fluctuations with more readings in the normal range than high. She has kept a log of her readings since her last visit, with some readings being low two months ago. She typically takes her blood pressure midday, referring to these as 'lunch readings'.  Her dietary habits include frequent consumption of bagels, typically a whole bagel for breakfast. She remains active, confirming regular exercise and mentioning a recent trip to [place]  for the [facility] relays.   No tingling in her hands or feet, issues with memory, or feeling off balance. She is up to date on her immunizations, including pneumonia, shingles, and tetanus vaccines, but has not received the RSV vaccine yet.    The patient is here for a follow-up on her blood pressure today. She reports compliance with meds. Denies headaches, chest pain and shortness of breath.   AWV completed with [facility] advisor: Millie Alm.   Hypertension This is a chronic problem. The current episode started more than 1 year ago. The problem is controlled. Pertinent negatives include no blurred vision, chest  pain, palpitations or shortness of breath. Risk factors for coronary artery disease include dyslipidemia and post-menopausal state. Past treatments include ACE inhibitors. The current treatment provides moderate improvement.     Past Medical History:  Diagnosis Date   Allergy 2000   Breast lump    Cataract    Colon polyps    Fibroids    GERD (gastroesophageal reflux disease)    Glaucoma    Heart murmur    Hyperlipidemia 01/05/1984   Hypertension 01/05/1984   Interstitial cystitis 01/04/1997   Kidney stones 01/05/2003   Neuromuscular disorder (HCC)    Osteoporosis    Preop cardiovascular exam 10/25/2022   Sleep apnea 2021   Tonsillitis      Family History  Problem Relation Age of Onset   Hypertension Mother    Heart disease Mother    Colon cancer Mother 70   Arthritis Mother    Cancer Mother    Coronary artery disease Father        mother, PGM   Stroke Father    Hypertension Father    Colon cancer Father 50   Cancer Father    Colon cancer Maternal Grandmother    Diabetes Other    Breast cancer Neg Hx    Colon polyps Neg Hx    Esophageal cancer Neg Hx    Stomach cancer Neg Hx    Rectal cancer Neg Hx    BRCA 1/2 Neg Hx      Current Outpatient Medications:    amLODipine  (NORVASC ) 5 MG tablet, TAKE  1 TABLET BY MOUTH DAILY, Disp: 90 tablet, Rfl: 3   atorvastatin  (LIPITOR) 80 MG tablet, TAKE 1 TABLET BY MOUTH DAILY, Disp: 90 tablet, Rfl: 3   CALCIUM  PO, Take 1 tablet by mouth daily. 500 mg daily, Disp: , Rfl:    Cholecalciferol (VITAMIN D PO), Take 1 tablet by mouth daily. 2000 units daily, Disp: , Rfl:    dorzolamide-timolol (COSOPT) 22.3-6.8 MG/ML ophthalmic solution, , Disp: , Rfl:    ezetimibe  (ZETIA ) 10 MG tablet, Take 1 tablet (10 mg total) by mouth daily., Disp: 90 tablet, Rfl: 3   finasteride (PROSCAR) 5 MG tablet, Take 5 mg by mouth daily., Disp: , Rfl:    latanoprost (XALATAN) 0.005 % ophthalmic solution, Place 1 drop into both eyes daily., Disp: , Rfl:     valsartan  (DIOVAN ) 160 MG tablet, Take 1 tablet (160 mg total) by mouth daily., Disp: 90 tablet, Rfl: 3   Allergies  Allergen Reactions   Hctz [Hydrochlorothiazide] Itching     Review of Systems  Constitutional: Negative.   Eyes:  Negative for blurred vision.  Respiratory: Negative.  Negative for shortness of breath.   Cardiovascular: Negative.  Negative for chest pain and palpitations.  Gastrointestinal: Negative.   Neurological: Negative.   Psychiatric/Behavioral: Negative.       Today's Vitals   06/02/23 1005 06/02/23 1034  BP: 102/72 110/80  Pulse: 64   Temp: 98.4 F (36.9 C)   SpO2: 98%   Weight: 170 lb 12.8 oz (77.5 kg)   Height: 5\' 3"  (1.6 m)    Body mass index is 30.26 kg/m.  Wt Readings from Last 3 Encounters:  06/02/23 170 lb 12.8 oz (77.5 kg)  02/16/23 169 lb (76.7 kg)  02/02/23 164 lb (74.4 kg)    The 10-year ASCVD risk score (Arnett DK, et al., 2019) is: 11.8%   Values used to calculate the score:     Age: 89 years     Sex: Female     Is Non-Hispanic African American: Yes     Diabetic: No     Tobacco smoker: No     Systolic Blood Pressure: 110 mmHg     Is BP treated: Yes     HDL Cholesterol: 53 mg/dL     Total Cholesterol: 149 mg/dL  Objective:  Physical Exam Vitals and nursing note reviewed.  Constitutional:      Appearance: Normal appearance.  HENT:     Head: Normocephalic and atraumatic.  Eyes:     Extraocular Movements: Extraocular movements intact.  Cardiovascular:     Rate and Rhythm: Normal rate and regular rhythm.     Heart sounds: Normal heart sounds.  Pulmonary:     Effort: Pulmonary effort is normal.     Breath sounds: Normal breath sounds.  Musculoskeletal:     Cervical back: Normal range of motion.  Skin:    General: Skin is warm.  Neurological:     General: No focal deficit present.     Mental Status: She is alert.  Psychiatric:        Mood and Affect: Mood normal.        Behavior: Behavior normal.          Assessment And Plan:  Hypertensive heart disease without heart failure Assessment & Plan: Chronic, hypertension improving with satisfactory office blood pressure of 110/80 mmHg. High sodium intake from breads and cheese identified as a potential contributor. - Monitor home blood pressure, including morning and pre-dinner readings. - Advise on reducing sodium  intake from breads and cheese. - Encourage regular exercise. - Order kidney function tests and A1c.  Orders: -     BMP8+eGFR  Atherosclerosis of both carotid arteries Assessment & Plan: Chronic, target LDL should be less than 70 based on carotid and coronary plaque. She will continue with atorvastatin  80mg  and ezetimibe  10mg  daily.  Encouraged to follow heart healthy lifestyle.    Pure hypercholesterolemia Assessment & Plan: Chronic, LDL goal is less than 70.  She will continue with atorvastatin  and ezetimibe .    Other abnormal glucose Assessment & Plan: Previous labs reviewed, her A1c has been elevated in the past. I will check an A1c today. Reminded to avoid refined sugars including sugary drinks/foods and processed meats including bacon, sausages and deli meats.    Orders: -     Hemoglobin A1c  Drug therapy -     Vitamin B12  General Health Maintenance Immunizations up to date. RSV vaccine recommended and covered by insurance. - Obtain RSV vaccine at pharmacy.  Return if symptoms worsen or fail to improve.  Patient was given opportunity to ask questions. Patient verbalized understanding of the plan and was able to repeat key elements of the plan. All questions were answered to their satisfaction.    I, Smiley Dung, MD, have reviewed all documentation for this visit. The documentation on 06/04/23 for the exam, diagnosis, procedures, and orders are all accurate and complete.   IF YOU HAVE BEEN REFERRED TO A SPECIALIST, IT MAY TAKE 1-2 WEEKS TO SCHEDULE/PROCESS THE REFERRAL. IF YOU HAVE NOT HEARD FROM  US /SPECIALIST IN TWO WEEKS, PLEASE GIVE US  A CALL AT [phone].

## 2023-06-03 LAB — BMP8+EGFR
BUN/Creatinine Ratio: 21 (ref 12–28)
BUN: 15 mg/dL (ref 8–27)
CO2: 18 mmol/L — ABNORMAL LOW (ref 20–29)
Calcium: 9.5 mg/dL (ref 8.7–10.3)
Chloride: 107 mmol/L — ABNORMAL HIGH (ref 96–106)
Creatinine, Ser: 0.71 mg/dL (ref 0.57–1.00)
Glucose: 96 mg/dL (ref 70–99)
Potassium: 4.4 mmol/L (ref 3.5–5.2)
Sodium: 141 mmol/L (ref 134–144)
eGFR: 86 mL/min/{1.73_m2} (ref >59)

## 2023-06-03 LAB — VITAMIN B12: Vitamin B-12: >2000 pg/mL — ABNORMAL HIGH (ref 232–1245)

## 2023-06-03 LAB — HEMOGLOBIN A1C
Est. average glucose Bld gHb Est-mCnc: 131 mg/dL
Hgb A1c MFr Bld: 6.2 % — ABNORMAL HIGH (ref 4.8–5.6)

## 2023-06-04 DIAGNOSIS — I119 Hypertensive heart disease without heart failure: Secondary | ICD-10-CM | POA: Insufficient documentation

## 2023-06-04 DIAGNOSIS — I1310 Hypertensive heart and chronic kidney disease without heart failure, with stage 1 through stage 4 chronic kidney disease, or unspecified chronic kidney disease: Secondary | ICD-10-CM | POA: Insufficient documentation

## 2023-06-04 NOTE — Assessment & Plan Note (Signed)
 Chronic, hypertension improving with satisfactory office blood pressure of 110/80 mmHg. High sodium intake from breads and cheese identified as a potential contributor. - Monitor home blood pressure, including morning and pre-dinner readings. - Advise on reducing sodium intake from breads and cheese. - Encourage regular exercise. - Order kidney function tests and A1c.

## 2023-06-04 NOTE — Assessment & Plan Note (Signed)
 Previous labs reviewed, her A1c has been elevated in the past. I will check an A1c today. Reminded to avoid refined sugars including sugary drinks/foods and processed meats including bacon, sausages and deli meats.

## 2023-06-04 NOTE — Assessment & Plan Note (Signed)
 Chronic, target LDL should be less than 70 based on carotid and coronary plaque. She will continue with atorvastatin 80mg  and ezetimibe 10mg  daily.  Encouraged to follow heart healthy lifestyle.

## 2023-06-04 NOTE — Assessment & Plan Note (Signed)
 Chronic, LDL goal is less than 70.  She will continue with atorvastatin and ezetimibe.

## 2023-06-08 ENCOUNTER — Ambulatory Visit: Payer: Self-pay | Admitting: Internal Medicine

## 2023-06-17 ENCOUNTER — Encounter: Payer: Self-pay | Admitting: Internal Medicine

## 2023-06-17 ENCOUNTER — Observation Stay (HOSPITAL_COMMUNITY)

## 2023-06-17 ENCOUNTER — Other Ambulatory Visit: Payer: Self-pay

## 2023-06-17 ENCOUNTER — Inpatient Hospital Stay (HOSPITAL_BASED_OUTPATIENT_CLINIC_OR_DEPARTMENT_OTHER)
Admission: EM | Admit: 2023-06-17 | Discharge: 2023-07-07 | DRG: 673 | Disposition: A | Discharge status: Still In Hospital | Attending: Internal Medicine | Admitting: Internal Medicine

## 2023-06-17 ENCOUNTER — Encounter (HOSPITAL_BASED_OUTPATIENT_CLINIC_OR_DEPARTMENT_OTHER): Payer: Self-pay | Admitting: Emergency Medicine

## 2023-06-17 ENCOUNTER — Emergency Department (HOSPITAL_BASED_OUTPATIENT_CLINIC_OR_DEPARTMENT_OTHER)

## 2023-06-17 ENCOUNTER — Ambulatory Visit: Payer: Self-pay

## 2023-06-17 DIAGNOSIS — Z7984 Long term (current) use of oral hypoglycemic drugs: Secondary | ICD-10-CM | POA: Diagnosis not present

## 2023-06-17 DIAGNOSIS — K760 Fatty (change of) liver, not elsewhere classified: Secondary | ICD-10-CM | POA: Diagnosis present

## 2023-06-17 DIAGNOSIS — K754 Autoimmune hepatitis: Secondary | ICD-10-CM | POA: Diagnosis present

## 2023-06-17 DIAGNOSIS — D1771 Benign lipomatous neoplasm of kidney: Secondary | ICD-10-CM | POA: Diagnosis present

## 2023-06-17 DIAGNOSIS — E8770 Fluid overload, unspecified: Secondary | ICD-10-CM | POA: Diagnosis not present

## 2023-06-17 DIAGNOSIS — N2889 Other specified disorders of kidney and ureter: Secondary | ICD-10-CM | POA: Diagnosis not present

## 2023-06-17 DIAGNOSIS — E872 Acidosis, unspecified: Secondary | ICD-10-CM | POA: Diagnosis not present

## 2023-06-17 DIAGNOSIS — R599 Enlarged lymph nodes, unspecified: Secondary | ICD-10-CM | POA: Diagnosis not present

## 2023-06-17 DIAGNOSIS — N179 Acute kidney failure, unspecified: Principal | ICD-10-CM | POA: Diagnosis present

## 2023-06-17 DIAGNOSIS — N3011 Interstitial cystitis (chronic) with hematuria: Secondary | ICD-10-CM | POA: Diagnosis present

## 2023-06-17 DIAGNOSIS — R760 Raised antibody titer: Secondary | ICD-10-CM | POA: Diagnosis not present

## 2023-06-17 DIAGNOSIS — H409 Unspecified glaucoma: Secondary | ICD-10-CM | POA: Diagnosis present

## 2023-06-17 DIAGNOSIS — E66811 Obesity, class 1: Secondary | ICD-10-CM | POA: Diagnosis present

## 2023-06-17 DIAGNOSIS — K862 Cyst of pancreas: Secondary | ICD-10-CM | POA: Diagnosis present

## 2023-06-17 DIAGNOSIS — Z888 Allergy status to other drugs, medicaments and biological substances status: Secondary | ICD-10-CM

## 2023-06-17 DIAGNOSIS — R748 Abnormal levels of other serum enzymes: Secondary | ICD-10-CM | POA: Diagnosis not present

## 2023-06-17 DIAGNOSIS — Z90710 Acquired absence of both cervix and uterus: Secondary | ICD-10-CM

## 2023-06-17 DIAGNOSIS — N200 Calculus of kidney: Secondary | ICD-10-CM | POA: Diagnosis present

## 2023-06-17 DIAGNOSIS — R809 Proteinuria, unspecified: Secondary | ICD-10-CM | POA: Diagnosis present

## 2023-06-17 DIAGNOSIS — K8689 Other specified diseases of pancreas: Secondary | ICD-10-CM | POA: Diagnosis not present

## 2023-06-17 DIAGNOSIS — K739 Chronic hepatitis, unspecified: Secondary | ICD-10-CM | POA: Diagnosis not present

## 2023-06-17 DIAGNOSIS — Z8673 Personal history of transient ischemic attack (TIA), and cerebral infarction without residual deficits: Secondary | ICD-10-CM | POA: Diagnosis not present

## 2023-06-17 DIAGNOSIS — Z992 Dependence on renal dialysis: Secondary | ICD-10-CM

## 2023-06-17 DIAGNOSIS — R7401 Elevation of levels of liver transaminase levels: Secondary | ICD-10-CM | POA: Diagnosis not present

## 2023-06-17 DIAGNOSIS — N170 Acute kidney failure with tubular necrosis: Secondary | ICD-10-CM | POA: Diagnosis not present

## 2023-06-17 DIAGNOSIS — R7303 Prediabetes: Secondary | ICD-10-CM | POA: Diagnosis present

## 2023-06-17 DIAGNOSIS — E785 Hyperlipidemia, unspecified: Secondary | ICD-10-CM | POA: Diagnosis present

## 2023-06-17 DIAGNOSIS — R933 Abnormal findings on diagnostic imaging of other parts of digestive tract: Secondary | ICD-10-CM | POA: Diagnosis not present

## 2023-06-17 DIAGNOSIS — Z4901 Encounter for fitting and adjustment of extracorporeal dialysis catheter: Secondary | ICD-10-CM | POA: Diagnosis not present

## 2023-06-17 DIAGNOSIS — R5381 Other malaise: Secondary | ICD-10-CM | POA: Diagnosis not present

## 2023-06-17 DIAGNOSIS — Z7982 Long term (current) use of aspirin: Secondary | ICD-10-CM

## 2023-06-17 DIAGNOSIS — E8809 Other disorders of plasma-protein metabolism, not elsewhere classified: Secondary | ICD-10-CM | POA: Diagnosis not present

## 2023-06-17 DIAGNOSIS — Z833 Family history of diabetes mellitus: Secondary | ICD-10-CM

## 2023-06-17 DIAGNOSIS — E860 Dehydration: Secondary | ICD-10-CM | POA: Diagnosis not present

## 2023-06-17 DIAGNOSIS — T380X5A Adverse effect of glucocorticoids and synthetic analogues, initial encounter: Secondary | ICD-10-CM | POA: Diagnosis not present

## 2023-06-17 DIAGNOSIS — E876 Hypokalemia: Secondary | ICD-10-CM | POA: Diagnosis not present

## 2023-06-17 DIAGNOSIS — G5622 Lesion of ulnar nerve, left upper limb: Secondary | ICD-10-CM | POA: Diagnosis not present

## 2023-06-17 DIAGNOSIS — G7281 Critical illness myopathy: Secondary | ICD-10-CM | POA: Diagnosis not present

## 2023-06-17 DIAGNOSIS — K7290 Hepatic failure, unspecified without coma: Secondary | ICD-10-CM | POA: Diagnosis not present

## 2023-06-17 DIAGNOSIS — Z8249 Family history of ischemic heart disease and other diseases of the circulatory system: Secondary | ICD-10-CM

## 2023-06-17 DIAGNOSIS — K838 Other specified diseases of biliary tract: Secondary | ICD-10-CM | POA: Diagnosis present

## 2023-06-17 DIAGNOSIS — N390 Urinary tract infection, site not specified: Secondary | ICD-10-CM | POA: Diagnosis not present

## 2023-06-17 DIAGNOSIS — T501X5A Adverse effect of loop [high-ceiling] diuretics, initial encounter: Secondary | ICD-10-CM | POA: Diagnosis not present

## 2023-06-17 DIAGNOSIS — Z79899 Other long term (current) drug therapy: Secondary | ICD-10-CM

## 2023-06-17 DIAGNOSIS — Z823 Family history of stroke: Secondary | ICD-10-CM

## 2023-06-17 DIAGNOSIS — H538 Other visual disturbances: Secondary | ICD-10-CM | POA: Diagnosis not present

## 2023-06-17 DIAGNOSIS — Z6830 Body mass index (BMI) 30.0-30.9, adult: Secondary | ICD-10-CM | POA: Diagnosis not present

## 2023-06-17 DIAGNOSIS — E43 Unspecified severe protein-calorie malnutrition: Secondary | ICD-10-CM | POA: Diagnosis not present

## 2023-06-17 DIAGNOSIS — R7989 Other specified abnormal findings of blood chemistry: Secondary | ICD-10-CM | POA: Diagnosis not present

## 2023-06-17 DIAGNOSIS — K5900 Constipation, unspecified: Secondary | ICD-10-CM | POA: Diagnosis present

## 2023-06-17 DIAGNOSIS — I10 Essential (primary) hypertension: Secondary | ICD-10-CM | POA: Diagnosis not present

## 2023-06-17 DIAGNOSIS — D696 Thrombocytopenia, unspecified: Secondary | ICD-10-CM | POA: Diagnosis not present

## 2023-06-17 DIAGNOSIS — Z860100 Personal history of colon polyps, unspecified: Secondary | ICD-10-CM

## 2023-06-17 DIAGNOSIS — R531 Weakness: Secondary | ICD-10-CM | POA: Diagnosis not present

## 2023-06-17 DIAGNOSIS — R319 Hematuria, unspecified: Secondary | ICD-10-CM | POA: Diagnosis not present

## 2023-06-17 DIAGNOSIS — D649 Anemia, unspecified: Secondary | ICD-10-CM | POA: Diagnosis not present

## 2023-06-17 DIAGNOSIS — D72828 Other elevated white blood cell count: Secondary | ICD-10-CM | POA: Diagnosis not present

## 2023-06-17 DIAGNOSIS — Z87442 Personal history of urinary calculi: Secondary | ICD-10-CM

## 2023-06-17 DIAGNOSIS — R0989 Other specified symptoms and signs involving the circulatory and respiratory systems: Secondary | ICD-10-CM | POA: Diagnosis not present

## 2023-06-17 DIAGNOSIS — R978 Other abnormal tumor markers: Secondary | ICD-10-CM | POA: Diagnosis not present

## 2023-06-17 DIAGNOSIS — N289 Disorder of kidney and ureter, unspecified: Secondary | ICD-10-CM | POA: Diagnosis not present

## 2023-06-17 DIAGNOSIS — D72829 Elevated white blood cell count, unspecified: Secondary | ICD-10-CM | POA: Diagnosis not present

## 2023-06-17 DIAGNOSIS — B9620 Unspecified Escherichia coli [E. coli] as the cause of diseases classified elsewhere: Secondary | ICD-10-CM | POA: Diagnosis not present

## 2023-06-17 DIAGNOSIS — M810 Age-related osteoporosis without current pathological fracture: Secondary | ICD-10-CM | POA: Diagnosis present

## 2023-06-17 DIAGNOSIS — Z800 Family history of malignant neoplasm of digestive organs: Secondary | ICD-10-CM

## 2023-06-17 DIAGNOSIS — M79671 Pain in right foot: Secondary | ICD-10-CM | POA: Diagnosis not present

## 2023-06-17 DIAGNOSIS — E1165 Type 2 diabetes mellitus with hyperglycemia: Secondary | ICD-10-CM | POA: Diagnosis not present

## 2023-06-17 DIAGNOSIS — Z8261 Family history of arthritis: Secondary | ICD-10-CM

## 2023-06-17 LAB — URINALYSIS, ROUTINE W REFLEX MICROSCOPIC
Bilirubin Urine: NEGATIVE
Glucose, UA: NEGATIVE mg/dL
Ketones, ur: NEGATIVE mg/dL
Nitrite: NEGATIVE
Protein, ur: >300 mg/dL — AB
Specific Gravity, Urine: 1.015 (ref 1.005–1.030)
WBC, UA: >50 WBC/hpf (ref 0–5)
pH: 6 (ref 5.0–8.0)

## 2023-06-17 LAB — CBC
HCT: 42.5 % (ref 36.0–46.0)
Hemoglobin: 14.4 g/dL (ref 12.0–15.0)
MCH: 29.7 pg (ref 26.0–34.0)
MCHC: 33.9 g/dL (ref 30.0–36.0)
MCV: 87.6 fL (ref 80.0–100.0)
Platelets: 165 10*3/uL (ref 150–400)
RBC: 4.85 MIL/uL (ref 3.87–5.11)
RDW: 18.1 % — ABNORMAL HIGH (ref 11.5–15.5)
WBC: 9.9 10*3/uL (ref 4.0–10.5)
nRBC: 0 % (ref 0.0–0.2)

## 2023-06-17 LAB — BASIC METABOLIC PANEL WITH GFR
Anion gap: 15 (ref 5–15)
BUN: 30 mg/dL — ABNORMAL HIGH (ref 8–23)
CO2: 17 mmol/L — ABNORMAL LOW (ref 22–32)
Calcium: 8.5 mg/dL — ABNORMAL LOW (ref 8.9–10.3)
Chloride: 105 mmol/L (ref 98–111)
Creatinine, Ser: 2.79 mg/dL — ABNORMAL HIGH (ref 0.44–1.00)
GFR, Estimated: 17 mL/min — ABNORMAL LOW (ref >60)
Glucose, Bld: 90 mg/dL (ref 70–99)
Potassium: 4.4 mmol/L (ref 3.5–5.1)
Sodium: 137 mmol/L (ref 135–145)

## 2023-06-17 LAB — TROPONIN T, HIGH SENSITIVITY
Troponin T High Sensitivity: <19 ng/L (ref <19)
Troponin T High Sensitivity: <15 ng/L (ref <19)

## 2023-06-17 LAB — TSH: TSH: 2.74 u[IU]/mL (ref 0.350–4.500)

## 2023-06-17 MED ORDER — EZETIMIBE 10 MG PO TABS
10.0000 mg | ORAL_TABLET | Freq: Every day | ORAL | Status: DC
  Administered 2023-06-19 – 2023-07-01 (×13): 10 mg via ORAL
  Filled 2023-06-17 (×14): qty 1

## 2023-06-17 MED ORDER — LATANOPROST 0.005 % OP SOLN
1.0000 [drp] | Freq: Every day | OPHTHALMIC | Status: DC
  Administered 2023-06-17 – 2023-07-06 (×20): 1 [drp] via OPHTHALMIC
  Filled 2023-06-17 (×2): qty 2.5

## 2023-06-17 MED ORDER — ONDANSETRON HCL 4 MG/2ML IJ SOLN
4.0000 mg | Freq: Four times a day (QID) | INTRAMUSCULAR | Status: DC | PRN
  Filled 2023-06-17: qty 2

## 2023-06-17 MED ORDER — ASPIRIN 81 MG PO TBEC
81.0000 mg | DELAYED_RELEASE_TABLET | Freq: Every day | ORAL | Status: DC
  Administered 2023-06-17 – 2023-06-20 (×4): 81 mg via ORAL
  Filled 2023-06-17 (×4): qty 1

## 2023-06-17 MED ORDER — ATORVASTATIN CALCIUM 40 MG PO TABS
80.0000 mg | ORAL_TABLET | Freq: Every day | ORAL | Status: DC
  Administered 2023-06-18 – 2023-06-19 (×2): 80 mg via ORAL
  Filled 2023-06-17 (×2): qty 2

## 2023-06-17 MED ORDER — ACETAMINOPHEN 325 MG PO TABS: 650.0000 mg | ORAL_TABLET | Freq: Four times a day (QID) | ORAL | Status: DC | PRN

## 2023-06-17 MED ORDER — SODIUM CHLORIDE 0.9 % IV BOLUS
1000.0000 mL | Freq: Once | INTRAVENOUS | Status: AC
  Administered 2023-06-17: 1000 mL via INTRAVENOUS

## 2023-06-17 MED ORDER — HEPARIN SODIUM (PORCINE) 5000 UNIT/ML IJ SOLN
5000.0000 [IU] | Freq: Three times a day (TID) | INTRAMUSCULAR | Status: DC
  Administered 2023-06-19 – 2023-06-20 (×2): 5000 [IU] via SUBCUTANEOUS
  Filled 2023-06-17 (×5): qty 1

## 2023-06-17 MED ORDER — AMLODIPINE BESYLATE 5 MG PO TABS
5.0000 mg | ORAL_TABLET | Freq: Every day | ORAL | Status: DC
  Administered 2023-06-17 – 2023-06-19 (×3): 5 mg via ORAL
  Filled 2023-06-17 (×3): qty 1

## 2023-06-17 MED ORDER — SODIUM CHLORIDE 0.9 % IV SOLN: INTRAVENOUS | Status: AC

## 2023-06-17 MED ORDER — DORZOLAMIDE HCL-TIMOLOL MAL 2-0.5 % OP SOLN
1.0000 [drp] | Freq: Two times a day (BID) | OPHTHALMIC | Status: DC
  Administered 2023-06-17 – 2023-07-07 (×40): 1 [drp] via OPHTHALMIC
  Filled 2023-06-17 (×2): qty 10

## 2023-06-17 NOTE — ED Notes (Signed)
 Report given to Loris Ros, nurse at [facility]

## 2023-06-17 NOTE — ED Notes (Signed)
 Called Taryn at [facility] for transport

## 2023-06-17 NOTE — Assessment & Plan Note (Signed)
 Continue high intensity statin daily  Continue zetia  daily

## 2023-06-17 NOTE — ED Notes (Signed)
 Called floor to give report no answer on the floor.

## 2023-06-17 NOTE — Assessment & Plan Note (Addendum)
 78 year old presenting to ED with complaints of fatigue x 4 days, poor PO intake and day of low blood pressure reading to 86/61 found to have an AKI  -obs to med surg -baseline creatinine normal, presenting with creatinine of 2.79 -possible pre+intra renal in setting of hypotension and poor PO intake and medication  -urine studies pending -US  renal pending  -UA appears dirty, she has no symptoms of UTI, culture pending  -hx of kidney stones years ago. She has no CVA tenderness/pain.  -received IVF in ED, gentle IVF overnight x 12 hours  -avoid nephrotoxic drugs -hold proscar  -check orthostatics  -trend

## 2023-06-17 NOTE — Assessment & Plan Note (Addendum)
 Continue high intensity statin and ASA

## 2023-06-17 NOTE — Assessment & Plan Note (Signed)
-  Blood pressure has been normotensive to elevated while in hospital setting  -hold valsartan  with AKI -hold proscar, has been on x 10 years for hair loss and known to cause hypotension  -check orthostatics  -continue norvasc

## 2023-06-17 NOTE — Telephone Encounter (Signed)
 FYI Only or Action Required?: FYI only for provider  Patient was last seen in primary care on 06/02/2023 by Cleave Curling, MD. Called Nurse Triage reporting Hypotension. Symptoms began feeling tired for several days but low blood pressure started this morning. Interventions attempted: Nothing. Symptoms are: gradually worsening.  Triage Disposition: See HCP Within 4 Hours (Or PCP Triage)  Patient/caregiver understands and will follow disposition?: Yes---Patient going to Urgent Care due to no availability in PCP Office.         Tired for several days BP 86/61 at 11 AM prior to speaking with this triage nurse.  BP 111/80 at 11:24 AM with 73 pulse BP 111/76 at 11:31 AM sitting BP 102/75 at 11:33 AM standing 81 pulse  Today hasn't taken bp med   Carolin Chyle it was 112/71      Copied from CRM #[id number]. Topic: Clinical - Red Word Triage >> Jun 17, 2023 11:16 AM Alica Antu wrote: Red Word that prompted transfer to Nurse Triage: low blood pressure 86/91 Reason for Disposition  [1] Systolic BP 90-110 AND [2] taking blood pressure medications AND [3] dizzy, lightheaded or weak    BP 111/80 at 11:24 AM with 73 pulse BP 111/76 at 11:31 AM sitting BP 102/75 at 11:33 AM standing 81 pulse Patient denies feeling dizzy when she stood up   Patient states she feels okay right now, just tired but given her vital signs during Triage--it is recommended that the patient goes to Urgent Care at this and to the Emergency Room if she feels any worse or her blood pressure drops any lower.  Patient verbalized understanding and states that she will go to Urgent Care at this time. She is advised to bring her blood pressure medications with her and her husband will drive her.  Answer Assessment - Initial Assessment Questions 1. BLOOD PRESSURE: What is the blood pressure? Did you take at least two measurements 5 minutes apart?     BP 86/61 at 11 AM     111/80 at 11:24 AM     111/76 at 11:31 AM  sitting     102/75 at 11:33 AM standing 81 pulse 2. ONSET: When did you take your blood pressure?     BP 86/61 at 11 AM    111/80 at 11:24 AM    111/76 at 11:31 AM sitting    102/75 at 11:33 AM standing 81 pulse  3. HOW: How did you obtain the blood pressure? (e.g., visiting nurse, automatic home BP monitor)     Automatic bp cuff at home 4. HISTORY: Do you have a history of low blood pressure? What is your blood pressure normally?     No 5. MEDICINES: Are you taking any medications for blood pressure? If Yes, ask: Have they been changed recently?     History of high blood pressure 6. PULSE RATE: Do you know what your pulse rate is?      73 7. OTHER SYMPTOMS: Have you been sick recently? Have you had a recent injury?     Patient states she's felt tired for several days  Yesterday BP was 112/71 Yesterday patient didn't have much of an appetite  Protocols used: Blood Pressure - Low-A-AH

## 2023-06-17 NOTE — ED Triage Notes (Signed)
 States been feeling more tired than noramal x 3 days. Took Bp at home and got 80/61. Denies CP or SHOB.

## 2023-06-17 NOTE — ED Notes (Signed)
 Patient aware of need for urine specimen, NS infusing, patient states she may be able to go after fluids.

## 2023-06-17 NOTE — H&P (Signed)
 History and Physical    Patient: Marissa Orozco ZOX:[id number] DOB: 04/19/1945 DOA: 06/17/2023 DOS: the patient was seen and examined on 06/17/2023 PCP: Cleave Curling, MD  Patient coming from: [facility] - lives with her husband    Chief Complaint: fatigue and low blood pressure   HPI: Marissa Orozco is a 78 y.o. female with medical history significant of  for hx of CVA, HTN, HLD, hx of kidney stones who presented to ED with complaints of low blood pressure and fatigue. She started to feel tired on Tuesday and stayed in bed all day. No other symptoms. She did check her blood pressure and it was around 110/70. She still was fatigued over the following 2 days. She has had poor PO intake over past several days as well.  Her blood pressure today when she took it was 86/61. She called her PCP and they told her to go hospital. She has had some lower back pain, but midline and relieved with patch. She has some mild swelling in her left ankle.   He has been feeling good. Denies any fever/chills, vision changes/headaches, chest pain or palpitations, shortness of breath or cough, abdominal pain, N/V/D, dysuria.   She has not had any change in her blood pressure medications recently. NO weight loss. Does not take any NSAIDs or supplements. Denies any urgency, frequency, dysuria. Urine was dark in color.   She does not smoke and drinks about 5 glasses of wine/week.   ER Course:  Vitals: afebrile, bp: 133/69, HR: 64, RR: 18, oxygen: 99%RA Pertinent labs: BUN: 30, creatinine: 2.79, CO2: 17,  CXR: no acute finding In ED: received 2L IVF. [facility] asked to admit.    Review of Systems: As mentioned in the history of present illness. All other systems reviewed and are negative. Past Medical History:  Diagnosis Date   Allergy 2000   Breast lump    Cataract    Colon polyps    Fibroids    GERD (gastroesophageal reflux disease)    Glaucoma    Heart murmur    Hyperlipidemia 01/05/1984   Hypertension 01/05/1984    Interstitial cystitis 01/04/1997   Kidney stones 01/05/2003   Neuromuscular disorder (HCC)    Osteoporosis    Preop cardiovascular exam 10/25/2022   Sleep apnea 2021   Tonsillitis    Past Surgical History:  Procedure Laterality Date   ABDOMINAL HYSTERECTOMY  1997   BREAST EXCISIONAL BIOPSY Right 07/27/2006   Intraductal Papilloma   BREAST SURGERY  2008   CATARACT EXTRACTION Bilateral 02/2020   other in 05/2020   COLONOSCOPY  2010   Dr Tova Fresh   EYE SURGERY  2022   HYSTEROSCOPY  1993   LAPAROSCOPIC HYSTERECTOMY  1994   MYOMECTOMY  1993   excision of tumor   POLYPECTOMY     TONSILLECTOMY  1953   TUBAL LIGATION  1980   Social History:  reports that she has never smoked. She has never used smokeless tobacco. She reports current alcohol use of about 5.0 standard drinks of alcohol per week. She reports that she does not use drugs.  Allergies  Allergen Reactions   Hydrochlorothiazide Itching    Family History  Problem Relation Age of Onset   Hypertension Mother    Heart disease Mother    Colon cancer Mother 52   Arthritis Mother    Cancer Mother    Coronary artery disease Father        mother, PGM   Stroke Father  Hypertension Father    Colon cancer Father 21   Cancer Father    Colon cancer Maternal Grandmother    Diabetes Other    Breast cancer Neg Hx    Colon polyps Neg Hx    Esophageal cancer Neg Hx    Stomach cancer Neg Hx    Rectal cancer Neg Hx    BRCA 1/2 Neg Hx     Prior to Admission medications   Medication Sig Start Date End Date Taking? Authorizing Provider  amLODipine  (NORVASC ) 5 MG tablet TAKE 1 TABLET BY MOUTH DAILY Patient taking differently: Take 5 mg by mouth at bedtime. 11/22/22  Yes Cleave Curling, MD  aspirin  EC 81 MG tablet Take 81 mg by mouth daily. Swallow whole.   Yes [provider]  atorvastatin  (LIPITOR) 80 MG tablet TAKE 1 TABLET BY MOUTH DAILY 09/08/22  Yes Cleave Curling, MD  Calcium  Carbonate (CALCIUM  500 PO) Take 500 mg  by mouth daily with breakfast.   Yes [provider]  Cholecalciferol (VITAMIN D3) 50 MCG (2000 UT) TABS Take 2,000 Units by mouth daily with breakfast.   Yes [provider]  dorzolamide-timolol (COSOPT) 22.3-6.8 MG/ML ophthalmic solution Place 1 drop into both eyes in the morning and at bedtime. 02/02/20  Yes [provider]  ezetimibe  (ZETIA ) 10 MG tablet Take 1 tablet (10 mg total) by mouth daily. 02/16/23  Yes Cleave Curling, MD  finasteride (PROSCAR) 5 MG tablet Take 5 mg by mouth daily.   Yes [provider]  latanoprost (XALATAN) 0.005 % ophthalmic solution Place 1 drop into both eyes at bedtime. 03/17/13  Yes [provider]  valsartan  (DIOVAN ) 160 MG tablet Take 1 tablet (160 mg total) by mouth daily. 02/16/23 02/16/24 Yes Cleave Curling, MD    Physical Exam: Vitals:   06/17/23 1740 06/17/23 1743 06/17/23 1756 06/17/23 2016  BP: (!) 141/71 123/79  (!) 146/76  Pulse: 65 75  64  Resp: 16 16  18   Temp:    97.6 F (36.4 C)  TempSrc:    Oral  SpO2: 100% 100%  99%  Weight:   76.2 kg   Height:   5' 3 (1.6 m)    General:  Appears calm and comfortable and is in NAD Eyes:  PERRL, EOMI, normal lids, iris ENT:  grossly normal hearing, lips & tongue, mmm; appropriate dentition Neck:  no LAD, masses or thyromegaly; no carotid bruits Cardiovascular:  RRR, +systolic murmur. No LE edema.  Respiratory:   CTA bilaterally with no wheezes/rales/rhonchi.  Normal respiratory effort. Abdomen:  soft, NT, ND, NABS Back:   normal alignment, no CVAT Skin:  no rash or induration seen on limited exam Musculoskeletal:  grossly normal tone BUE/BLE, good ROM, no bony abnormality Lower extremity:  No LE edema.  Limited foot exam with no ulcerations.  2+ distal pulses. Psychiatric:  grossly normal mood and affect, speech fluent and appropriate, AOx3 Neurologic:  CN 2-12 grossly intact, moves all extremities in coordinated fashion, sensation intact   Radiological  Exams on Admission: Independently reviewed - see discussion in A/P where applicable  DG Chest Port 1 View Result Date: 06/17/2023 CLINICAL DATA:  Fatigue and malaise for 3 days. EXAM: PORTABLE CHEST 1 VIEW COMPARISON:  09/07/2011. FINDINGS: Low lung volume. Bilateral lung fields are clear. Bilateral costophrenic angles are clear. Mildly enlarged cardio-mediastinal silhouette, likely accentuated by low lung volume and AP technique. No acute osseous abnormalities. The soft tissues are within normal limits. IMPRESSION: No active disease. Electronically Signed  By: Beula Brunswick M.D.   On: 06/17/2023 12:38    EKG: Independently reviewed.  NSR with rate 59; nonspecific ST changes with no evidence of acute ischemia. Atrial premature contractions    Labs on Admission: I have personally reviewed the available labs and imaging studies at the time of the admission.  Pertinent labs:   BUN: 30,  creatinine: 2.79,  CO2: 17,   Assessment and Plan: Principal Problem:   AKI (acute kidney injury) (HCC) Active Problems:   HTN (hypertension), malignant   Hyperlipidemia with target low density lipoprotein (LDL) cholesterol less than 70 mg/dL   History of stroke    Assessment and Plan: * AKI (acute kidney injury) (HCC) 78 year old presenting to ED with complaints of fatigue x 4 days, poor PO intake and day of low blood pressure reading to 86/61 found to have an AKI  -obs to med surg -baseline creatinine normal, presenting with creatinine of 2.79 -possible pre+intra renal in setting of hypotension and poor PO intake and medication  -urine studies pending -US  renal pending  -UA appears dirty, she has no symptoms of UTI, culture pending  -hx of kidney stones years ago. She has no CVA tenderness/pain.  -received IVF in ED, gentle IVF overnight x 12 hours  -avoid nephrotoxic drugs -hold proscar  -check orthostatics  -trend   HTN (hypertension), malignant -Blood pressure has been normotensive to  elevated while in hospital setting  -hold valsartan  with AKI -hold proscar, has been on x 10 years for hair loss and known to cause hypotension  -check orthostatics  -continue norvasc     Hyperlipidemia with target low density lipoprotein (LDL) cholesterol less than 70 mg/dL Continue high intensity statin daily  Continue zetia  daily   History of stroke Continue high intensity statin and ASA     Advance Care Planning:   Code Status: Full Code   Consults: none   DVT Prophylaxis: heparin   Family Communication: none   Severity of Illness: The appropriate patient status for this patient is OBSERVATION. Observation status is judged to be reasonable and necessary in order to provide the required intensity of service to ensure the patient's safety. The patient's presenting symptoms, physical exam findings, and initial radiographic and laboratory data in the context of their medical condition is felt to place them at decreased risk for further clinical deterioration. Furthermore, it is anticipated that the patient will be medically stable for discharge from the hospital within 2 midnights of admission.   Author: Raymona Caldwell, MD 06/17/2023 9:34 PM  For on call review [email or web address].

## 2023-06-17 NOTE — Progress Notes (Addendum)
 Plan of Care Note for accepted transfer   Patient: Marissa Orozco MRN: [id number]   DOA: 06/17/2023  [facility]ility [facility] transfer: [facility] Requesting Provider: Dr. Scarlette Currier  Reason for transfer: AKI/hypotension  Facility course: 78 year old female with past medical history significant for hx of CVA, HTN, HLD, hx of kidney stones who presented to ED with complaints of low blood pressure and fatigue.She has had poor PO intake over past several days as well.  Called PCP who advised to go to urgent care/ED. Her blood pressure today when she took it was 80/61.   Vitals: afebrile, bp: 133/69, HR: 64, RR: 18, oxygen: 99%RA Pertinent labs: BUN: 30, creatinine: 2.79, CO2: 17,  CXR: no acute finding In ED: received 2L IVF. [facility] asked to admit.   Plan of care: The patient is accepted for admission to Med-surg  unit, at [facility]   -strict I/O, urine studies and diet order placed    Author: Raymona Caldwell, MD 06/17/2023  Check [email or web address] for on-call coverage.  Nursing staff, Please call [facility] Admits & Consults System-Wide number on Amion as soon as patient's arrival, so appropriate admitting provider can evaluate the pt.

## 2023-06-17 NOTE — Telephone Encounter (Signed)
 Copied from CRM [id number]. Topic: Clinical - Red Word Triage >> Jun 17, 2023 11:16 AM Marissa Orozco wrote: Red Word that prompted transfer to Nurse Triage: low blood pressure 86/91

## 2023-06-17 NOTE — ED Provider Notes (Signed)
 [place] EMERGENCY DEPARTMENT AT [facility] Provider Note   CSN: [id number] Arrival date & time: 06/17/23  1210     Patient presents with: Hypotension   Marissa Orozco is a 78 y.o. female.   Pt is a 78 yo female with pmhx significant for htn, hld, interstitial cystitis, glaucoma, GERD, and kidney stones.  Pt said she has been feeling very fatigued since Tuesday, 6/10.  She has not been eating/drinking much.  She denies any pain.  BP low at home.       Prior to Admission medications   Medication Sig Start Date End Date Taking? Authorizing Provider  amLODipine  (NORVASC ) 5 MG tablet TAKE 1 TABLET BY MOUTH DAILY 11/22/22   Cleave Curling, MD  atorvastatin  (LIPITOR) 80 MG tablet TAKE 1 TABLET BY MOUTH DAILY 09/08/22   Cleave Curling, MD  CALCIUM  PO Take 1 tablet by mouth daily. 500 mg daily    [provider]  Cholecalciferol (VITAMIN D PO) Take 1 tablet by mouth daily. 2000 units daily    [provider]  dorzolamide-timolol (COSOPT) 22.3-6.8 MG/ML ophthalmic solution  02/02/20   [provider]  ezetimibe  (ZETIA ) 10 MG tablet Take 1 tablet (10 mg total) by mouth daily. 02/16/23   Cleave Curling, MD  finasteride (PROSCAR) 5 MG tablet Take 5 mg by mouth daily.    [provider]  latanoprost (XALATAN) 0.005 % ophthalmic solution Place 1 drop into both eyes daily. 03/17/13   [provider]  valsartan  (DIOVAN ) 160 MG tablet Take 1 tablet (160 mg total) by mouth daily. 02/16/23 02/16/24  Cleave Curling, MD    Allergies: Hctz [hydrochlorothiazide]    Review of Systems  Constitutional:  Positive for appetite change and fatigue.  All other systems reviewed and are negative.   Updated Vital Signs BP (!) 142/75   Pulse (!) 58   Temp 98.4 F (36.9 C) (Oral)   Resp 20   SpO2 100%   Physical Exam Vitals and nursing note reviewed.  Constitutional:      Appearance: Normal appearance.  HENT:     Head: Normocephalic and atraumatic.      Right Ear: External ear normal.     Left Ear: External ear normal.     Nose: Nose normal.     Mouth/Throat:     Mouth: Mucous membranes are moist.     Pharynx: Oropharynx is clear.   Eyes:     Extraocular Movements: Extraocular movements intact.     Conjunctiva/sclera: Conjunctivae normal.     Pupils: Pupils are equal, round, and reactive to light.    Cardiovascular:     Rate and Rhythm: Normal rate and regular rhythm.     Pulses: Normal pulses.     Heart sounds: Normal heart sounds.  Pulmonary:     Effort: Pulmonary effort is normal.     Breath sounds: Normal breath sounds.  Abdominal:     General: Abdomen is flat.     Palpations: Abdomen is soft.   Musculoskeletal:        General: Normal range of motion.     Cervical back: Normal range of motion and neck supple.   Skin:    General: Skin is warm.     Capillary Refill: Capillary refill takes less than 2 seconds.   Neurological:     General: No focal deficit present.     Mental Status: She is alert and oriented to person, place, and time.   Psychiatric:  Mood and Affect: Mood normal.        Behavior: Behavior normal.     (all labs ordered are listed, but only abnormal results are displayed) Labs Reviewed  BASIC METABOLIC PANEL WITH GFR - Abnormal; Notable for the following components:      Result Value   CO2 17 (*)    BUN 30 (*)    Creatinine, Ser 2.79 (*)    Calcium  8.5 (*)    GFR, Estimated 17 (*)    All other components within normal limits  CBC - Abnormal; Notable for the following components:   RDW 18.1 (*)    All other components within normal limits  TSH  URINALYSIS, ROUTINE W REFLEX MICROSCOPIC  TROPONIN T, HIGH SENSITIVITY  TROPONIN T, HIGH SENSITIVITY    EKG: EKG Interpretation Date/Time:  Friday June 17 2023 12:23:46 EDT Ventricular Rate:  59 PR Interval:  162 QRS Duration:  83 QT Interval:  431 QTC Calculation: 427 R Axis:   13  Text Interpretation: Sinus rhythm Atrial  premature complex Abnormal R-wave progression, early transition Nonspecific T abnormalities, diffuse leads No significant change since last tracing Confirmed by Sueellen Emery [phone]) on 06/17/2023 1:26:29 PM  Radiology: Lenell Query Chest Port 1 View Result Date: 06/17/2023 CLINICAL DATA:  Fatigue and malaise for 3 days. EXAM: PORTABLE CHEST 1 VIEW COMPARISON:  09/07/2011. FINDINGS: Low lung volume. Bilateral lung fields are clear. Bilateral costophrenic angles are clear. Mildly enlarged cardio-mediastinal silhouette, likely accentuated by low lung volume and AP technique. No acute osseous abnormalities. The soft tissues are within normal limits. IMPRESSION: No active disease. Electronically Signed   By: Beula Brunswick M.D.   On: 06/17/2023 12:38     Procedures   Medications Ordered in the ED  sodium chloride  0.9 % bolus 1,000 mL (0 mLs Intravenous Stopped 06/17/23 1350)  sodium chloride  0.9 % bolus 1,000 mL (0 mLs Intravenous Stopped 06/17/23 1522)                                    Medical Decision Making Amount and/or Complexity of Data Reviewed Labs: ordered. Radiology: ordered.  Risk Decision regarding hospitalization.   This patient presents to the ED for concern of fatigue, this involves an extensive number of treatment options, and is a complaint that carries with it a high risk of complications and morbidity.  The differential diagnosis includes infection, dehydration, electrolyte abn, anemia   Co morbidities that complicate the patient evaluation  htn, hld, interstitial cystitis, glaucoma, GERD, and kidney stones   Additional history obtained:  Additional history obtained from epic chart review External records from outside source obtained and reviewed including husband   Lab Tests:  I Ordered, and personally interpreted labs.  The pertinent results include:  cbc nl, bmp with bun 30 and cr 2.79 (bun 15 and cr 0.71 on 5/29)   Imaging Studies ordered:  I ordered imaging  studies including cxr  I independently visualized and interpreted imaging which showed No active disease.  I agree with the radiologist interpretation   Cardiac Monitoring:  The patient was maintained on a cardiac monitor.  I personally viewed and interpreted the cardiac monitored which showed an underlying rhythm of: nsr   Medicines ordered and prescription drug management:  I ordered medication including ivfs  for sx  Reevaluation of the patient after these medicines showed that the patient improved I have reviewed the patients home medicines and  have made adjustments as needed  Critical Interventions:  ivfs   Consultations Obtained:  I requested consultation with the hospitalist (Dr. Francesco Inks),  and discussed lab and imaging findings as well as pertinent plan -    Problem List / ED Course:  AKI:  likely from dehydration.  Pt is now able to urinate after IVFs.  Pt is feeling better.    Reevaluation:  After the interventions noted above, I reevaluated the patient and found that they have :improved   Social Determinants of Health:  Lives at home   Dispostion:  After consideration of the diagnostic results and the patients response to treatment, I feel that the patent would benefit from admission.       Final diagnoses:  AKI (acute kidney injury) [facility])  Dehydration    ED Discharge Orders     None          Sueellen Emery, MD 06/17/23 1555

## 2023-06-18 DIAGNOSIS — E66811 Obesity, class 1: Secondary | ICD-10-CM | POA: Diagnosis present

## 2023-06-18 DIAGNOSIS — E860 Dehydration: Secondary | ICD-10-CM | POA: Diagnosis present

## 2023-06-18 DIAGNOSIS — R809 Proteinuria, unspecified: Secondary | ICD-10-CM | POA: Diagnosis present

## 2023-06-18 DIAGNOSIS — D649 Anemia, unspecified: Secondary | ICD-10-CM | POA: Diagnosis not present

## 2023-06-18 DIAGNOSIS — R7303 Prediabetes: Secondary | ICD-10-CM | POA: Diagnosis present

## 2023-06-18 DIAGNOSIS — K754 Autoimmune hepatitis: Secondary | ICD-10-CM | POA: Diagnosis present

## 2023-06-18 DIAGNOSIS — D1771 Benign lipomatous neoplasm of kidney: Secondary | ICD-10-CM | POA: Diagnosis present

## 2023-06-18 DIAGNOSIS — R933 Abnormal findings on diagnostic imaging of other parts of digestive tract: Secondary | ICD-10-CM | POA: Diagnosis not present

## 2023-06-18 DIAGNOSIS — E872 Acidosis, unspecified: Secondary | ICD-10-CM | POA: Diagnosis present

## 2023-06-18 DIAGNOSIS — Z8673 Personal history of transient ischemic attack (TIA), and cerebral infarction without residual deficits: Secondary | ICD-10-CM | POA: Diagnosis not present

## 2023-06-18 DIAGNOSIS — R319 Hematuria, unspecified: Secondary | ICD-10-CM | POA: Diagnosis not present

## 2023-06-18 DIAGNOSIS — D72829 Elevated white blood cell count, unspecified: Secondary | ICD-10-CM | POA: Diagnosis not present

## 2023-06-18 DIAGNOSIS — E8809 Other disorders of plasma-protein metabolism, not elsewhere classified: Secondary | ICD-10-CM | POA: Diagnosis present

## 2023-06-18 DIAGNOSIS — R748 Abnormal levels of other serum enzymes: Secondary | ICD-10-CM | POA: Diagnosis not present

## 2023-06-18 DIAGNOSIS — I10 Essential (primary) hypertension: Secondary | ICD-10-CM | POA: Diagnosis present

## 2023-06-18 DIAGNOSIS — Z992 Dependence on renal dialysis: Secondary | ICD-10-CM | POA: Diagnosis not present

## 2023-06-18 DIAGNOSIS — K862 Cyst of pancreas: Secondary | ICD-10-CM | POA: Diagnosis present

## 2023-06-18 DIAGNOSIS — E876 Hypokalemia: Secondary | ICD-10-CM | POA: Diagnosis not present

## 2023-06-18 DIAGNOSIS — E43 Unspecified severe protein-calorie malnutrition: Secondary | ICD-10-CM | POA: Diagnosis present

## 2023-06-18 DIAGNOSIS — N3011 Interstitial cystitis (chronic) with hematuria: Secondary | ICD-10-CM | POA: Diagnosis present

## 2023-06-18 DIAGNOSIS — N179 Acute kidney failure, unspecified: Secondary | ICD-10-CM | POA: Diagnosis not present

## 2023-06-18 DIAGNOSIS — E785 Hyperlipidemia, unspecified: Secondary | ICD-10-CM | POA: Diagnosis present

## 2023-06-18 DIAGNOSIS — R7401 Elevation of levels of liver transaminase levels: Secondary | ICD-10-CM | POA: Diagnosis not present

## 2023-06-18 DIAGNOSIS — K838 Other specified diseases of biliary tract: Secondary | ICD-10-CM | POA: Diagnosis not present

## 2023-06-18 DIAGNOSIS — D696 Thrombocytopenia, unspecified: Secondary | ICD-10-CM | POA: Diagnosis not present

## 2023-06-18 DIAGNOSIS — K760 Fatty (change of) liver, not elsewhere classified: Secondary | ICD-10-CM | POA: Diagnosis present

## 2023-06-18 DIAGNOSIS — G7281 Critical illness myopathy: Secondary | ICD-10-CM | POA: Diagnosis present

## 2023-06-18 DIAGNOSIS — B9620 Unspecified Escherichia coli [E. coli] as the cause of diseases classified elsewhere: Secondary | ICD-10-CM | POA: Diagnosis present

## 2023-06-18 DIAGNOSIS — E8770 Fluid overload, unspecified: Secondary | ICD-10-CM | POA: Diagnosis not present

## 2023-06-18 DIAGNOSIS — N390 Urinary tract infection, site not specified: Secondary | ICD-10-CM | POA: Diagnosis not present

## 2023-06-18 DIAGNOSIS — Z6830 Body mass index (BMI) 30.0-30.9, adult: Secondary | ICD-10-CM | POA: Diagnosis not present

## 2023-06-18 DIAGNOSIS — N170 Acute kidney failure with tubular necrosis: Secondary | ICD-10-CM | POA: Diagnosis present

## 2023-06-18 LAB — BASIC METABOLIC PANEL WITH GFR
Anion gap: 6 (ref 5–15)
BUN: 37 mg/dL — ABNORMAL HIGH (ref 8–23)
CO2: 18 mmol/L — ABNORMAL LOW (ref 22–32)
Calcium: 7.2 mg/dL — ABNORMAL LOW (ref 8.9–10.3)
Chloride: 112 mmol/L — ABNORMAL HIGH (ref 98–111)
Creatinine, Ser: 2.71 mg/dL — ABNORMAL HIGH (ref 0.44–1.00)
GFR, Estimated: 17 mL/min — ABNORMAL LOW (ref >60)
Glucose, Bld: 140 mg/dL — ABNORMAL HIGH (ref 70–99)
Potassium: 3.5 mmol/L (ref 3.5–5.1)
Sodium: 136 mmol/L (ref 135–145)

## 2023-06-18 LAB — CBC
HCT: 38.9 % (ref 36.0–46.0)
Hemoglobin: 13 g/dL (ref 12.0–15.0)
MCH: 30.4 pg (ref 26.0–34.0)
MCHC: 33.4 g/dL (ref 30.0–36.0)
MCV: 90.9 fL (ref 80.0–100.0)
Platelets: 153 10*3/uL (ref 150–400)
RBC: 4.28 MIL/uL (ref 3.87–5.11)
RDW: 18 % — ABNORMAL HIGH (ref 11.5–15.5)
WBC: 7.7 10*3/uL (ref 4.0–10.5)
nRBC: 0 % (ref 0.0–0.2)

## 2023-06-18 LAB — URINALYSIS, ROUTINE W REFLEX MICROSCOPIC
Bilirubin Urine: NEGATIVE
Glucose, UA: 150 mg/dL — AB
Ketones, ur: NEGATIVE mg/dL
Nitrite: NEGATIVE
Protein, ur: >=300 mg/dL — AB
Specific Gravity, Urine: 1.013 (ref 1.005–1.030)
WBC, UA: >50 WBC/hpf (ref 0–5)
pH: 5 (ref 5.0–8.0)

## 2023-06-18 LAB — SODIUM, URINE, RANDOM: Sodium, Ur: 26 mmol/L

## 2023-06-18 LAB — CREATININE, URINE, RANDOM: Creatinine, Urine: 142 mg/dL

## 2023-06-18 MED ORDER — SODIUM CHLORIDE 0.9 % IV SOLN: INTRAVENOUS | Status: AC

## 2023-06-18 NOTE — Progress Notes (Signed)
 Mobility Specialist - Progress Note   06/18/23 1013  Mobility  Activity Ambulated independently in hallway  Level of Assistance Independent  Assistive Device None  Distance Ambulated (ft) 500 ft  Activity Response Tolerated well  Mobility Referral Yes  Mobility visit 1 Mobility  Mobility Specialist Start Time (ACUTE ONLY) 1005  Mobility Specialist Stop Time (ACUTE ONLY) 1013  Mobility Specialist Time Calculation (min) (ACUTE ONLY) 8 min   Pt received in bed and agreeable to mobility. No complaints during session. Pt to bed after session with all needs met.    [facility]

## 2023-06-18 NOTE — Plan of Care (Signed)
  Problem: Education: Goal: Knowledge of General Education information will improve Description: Including pain rating scale, medication(s)/side effects and non-pharmacologic comfort measures 06/18/2023 0941 by Ara Bays, LPN Outcome: Progressing 06/18/2023 0941 by Ara Bays, LPN Outcome: Progressing   Problem: Health Behavior/Discharge Planning: Goal: Ability to manage health-related needs will improve 06/18/2023 0941 by Ara Bays, LPN Outcome: Progressing 06/18/2023 0941 by Ara Bays, LPN Outcome: Progressing   Problem: Clinical Measurements: Goal: Ability to maintain clinical measurements within normal limits will improve 06/18/2023 0941 by Ara Bays, LPN Outcome: Progressing 06/18/2023 0941 by Ara Bays, LPN Outcome: Progressing Goal: Will remain free from infection 06/18/2023 0941 by Ara Bays, LPN Outcome: Progressing 06/18/2023 0941 by Ara Bays, LPN Outcome: Progressing Goal: Diagnostic test results will improve 06/18/2023 0941 by Ara Bays, LPN Outcome: Progressing 06/18/2023 0941 by Ara Bays, LPN Outcome: Progressing Goal: Respiratory complications will improve 06/18/2023 0941 by Ara Bays, LPN Outcome: Progressing 06/18/2023 0941 by Ara Bays, LPN Outcome: Progressing Goal: Cardiovascular complication will be avoided 06/18/2023 0941 by Ara Bays, LPN Outcome: Progressing 06/18/2023 0941 by Ara Bays, LPN Outcome: Progressing   Problem: Activity: Goal: Risk for activity intolerance will decrease 06/18/2023 0941 by Ara Bays, LPN Outcome: Progressing 06/18/2023 0941 by Ara Bays, LPN Outcome: Progressing   Problem: Nutrition: Goal: Adequate nutrition will be maintained 06/18/2023 0941 by Ara Bays, LPN Outcome: Progressing 06/18/2023 0941 by Ara Bays, LPN Outcome: Progressing   Problem: Coping: Goal: Level of anxiety will decrease 06/18/2023 0941 by Ara Bays, LPN Outcome:  Progressing 06/18/2023 0941 by Ara Bays, LPN Outcome: Progressing   Problem: Elimination: Goal: Will not experience complications related to bowel motility 06/18/2023 0941 by Ara Bays, LPN Outcome: Progressing 06/18/2023 0941 by Ara Bays, LPN Outcome: Progressing Goal: Will not experience complications related to urinary retention 06/18/2023 0941 by Ara Bays, LPN Outcome: Progressing 06/18/2023 0941 by Ara Bays, LPN Outcome: Progressing   Problem: Pain Managment: Goal: General experience of comfort will improve and/or be controlled 06/18/2023 0941 by Ara Bays, LPN Outcome: Progressing 06/18/2023 0941 by Ara Bays, LPN Outcome: Progressing   Problem: Safety: Goal: Ability to remain free from injury will improve 06/18/2023 0941 by Ara Bays, LPN Outcome: Progressing 06/18/2023 0941 by Ara Bays, LPN Outcome: Progressing   Problem: Skin Integrity: Goal: Risk for impaired skin integrity will decrease 06/18/2023 0941 by Ara Bays, LPN Outcome: Progressing 06/18/2023 0941 by Ara Bays, LPN Outcome: Progressing   Problem: Urinary Elimination: Goal: Signs and symptoms of infection will decrease 06/18/2023 0941 by Ara Bays, LPN Outcome: Progressing 06/18/2023 0941 by Ara Bays, LPN Outcome: Progressing

## 2023-06-18 NOTE — TOC CM/SW Note (Signed)
 Transition of Care [facility]) - Inpatient Brief Assessment   Patient Details  Name: Marissa Orozco MRN: [id number] Date of Birth: 28-Oct-1945  [facility] of [facility]) CM/SW Contact:    Gertha Ku, LCSW Phone Number: 06/18/2023, 11:49 AM    Transition of Care Asessment: Insurance and Status: Insurance coverage has been reviewed Patient has primary care physician: Yes Home environment has been reviewed: home with spouse Prior level of function:: indepednent Prior/Current Home Services: No current home services Social Drivers of Health Review: SDOH reviewed no interventions necessary Readmission risk has been reviewed: Yes Transition of care needs: no transition of care needs at this time

## 2023-06-18 NOTE — Care Management Obs Status (Signed)
 MEDICARE OBSERVATION STATUS NOTIFICATION   Patient Details  Name: Marissa Orozco MRN: [id number] Date of Birth: 1945-03-23   Medicare Observation Status Notification Given:  Yes    Gertha Ku, LCSW 06/18/2023, 11:48 AM

## 2023-06-18 NOTE — Plan of Care (Addendum)
 VSS. No c/o pain nor dizziness. NS infusing at 75ml/hr. LBM 6/14. No acute events overnight.  Problem: Education: Goal: Knowledge of General Education information will improve Description: Including pain rating scale, medication(s)/side effects and non-pharmacologic comfort measures Outcome: Progressing   Problem: Health Behavior/Discharge Planning: Goal: Ability to manage health-related needs will improve Outcome: Progressing   Problem: Clinical Measurements: Goal: Ability to maintain clinical measurements within normal limits will improve Outcome: Progressing Goal: Will remain free from infection Outcome: Progressing   Problem: Safety: Goal: Ability to remain free from injury will improve Outcome: Progressing   Problem: Urinary Elimination: Goal: Signs and symptoms of infection will decrease Outcome: Progressing

## 2023-06-18 NOTE — Progress Notes (Signed)
 PROGRESS NOTE    Marissa Orozco  ZOX:[id number] DOB: 07-23-1945 DOA: 06/17/2023 PCP: Cleave Curling, MD    Brief Narrative:  Marissa Orozco is a 78 y.o. female with medical history significant of  for hx of CVA, HTN, HLD, hx of kidney stones who presented to ED with complaints of low blood pressure and fatigue. She started to feel tired on Tuesday and stayed in bed all day. No other symptoms. She did check her blood pressure and it was around 110/70. She still was fatigued over the following 2 days. She has had poor PO intake over past several days as well.  Her blood pressure today when she took it was 86/61. She called her PCP and they told her to go hospital.     Assessment and Plan: * AKI (acute kidney injury) (HCC) -baseline creatinine normal, presenting with creatinine of 2.79-  - Repeat UA to assess for protein -hx of kidney stones years ago. She has no CVA tenderness/pain.  Continue IV fluids  HTN (hypertension), malignant -Blood pressure has been normotensive to elevated while in hospital setting  -hold valsartan  with AKI   Hyperlipidemia with target low density lipoprotein (LDL) cholesterol less than 70 mg/dL Continue high intensity statin daily  Continue zetia  daily   History of stroke Continue high intensity statin and ASA     Ambulate patient and suspect discharge in the a.m. if creatinine continues to improve       DVT prophylaxis: heparin injection 5,000 Units Start: 06/17/23 2215    Code Status: Full Code  Disposition Plan:  Level of care: Med-Surg Status is: Observation     Consultants:  None  Subjective: Eating and drinking well, feeling better  Objective: Vitals:   06/17/23 2143 06/17/23 2145 06/18/23 0025 06/18/23 0423  BP: (!) 149/73 130/70 131/72 124/69  Pulse: 73 75 69 70  Resp: 18 20 18 18   Temp: 97.8 F (36.6 C) 97.6 F (36.4 C) 98.3 F (36.8 C) 98.2 F (36.8 C)  TempSrc: Oral  Oral Oral  SpO2: 99% 99% 97% 96%  Weight:       Height:        Intake/Output Summary (Last 24 hours) at 06/18/2023 1114 Last data filed at 06/18/2023 0423 Gross per 24 hour  Intake 360 ml  Output 300 ml  Net 60 ml   Filed Weights   06/17/23 1756  Weight: 76.2 kg    Examination:   General: Appearance:     Overweight female in no acute distress     Lungs:     respirations unlabored  Heart:    Normal heart rate.    MS:   All extremities are intact.    Neurologic:   Awake, alert       Data Reviewed: I have personally reviewed following labs and imaging studies  CBC: Recent Labs  Lab 06/17/23 1238 06/18/23 0541  WBC 9.9 7.7  HGB 14.4 13.0  HCT 42.5 38.9  MCV 87.6 90.9  PLT 165 153   Basic Metabolic Panel: Recent Labs  Lab 06/17/23 1238 06/18/23 0541  NA 137 136  K 4.4 3.5  CL 105 112*  CO2 17* 18*  GLUCOSE 90 140*  BUN 30* 37*  CREATININE 2.79* 2.71*  CALCIUM  8.5* 7.2*   GFR: Estimated Creatinine Clearance: 16.4 mL/min (A) (by C-G formula based on SCr of 2.71 mg/dL (H)). Liver Function Tests: No results for input(s): AST, ALT, ALKPHOS, BILITOT, PROT, ALBUMIN in the last 168 hours. No results for  input(s): LIPASE, AMYLASE in the last 168 hours. No results for input(s): AMMONIA in the last 168 hours. Coagulation Profile: No results for input(s): INR, PROTIME in the last 168 hours. Cardiac Enzymes: No results for input(s): CKTOTAL, CKMB, CKMBINDEX, TROPONINI in the last 168 hours. BNP (last 3 results) No results for input(s): PROBNP in the last 8760 hours. HbA1C: No results for input(s): HGBA1C in the last 72 hours. CBG: No results for input(s): GLUCAP in the last 168 hours. Lipid Profile: No results for input(s): CHOL, HDL, LDLCALC, TRIG, CHOLHDL, LDLDIRECT in the last 72 hours. Thyroid  Function Tests: Recent Labs    06/17/23 1238  TSH 2.740   Anemia Panel: No results for input(s): VITAMINB12, FOLATE, FERRITIN, TIBC, IRON,  RETICCTPCT in the last 72 hours. Sepsis Labs: No results for input(s): PROCALCITON, LATICACIDVEN in the last 168 hours.  No results found for this or any previous visit (from the past 240 hours).       Radiology Studies: US  RENAL Result Date: 06/17/2023 CLINICAL DATA:  Acute kidney injury EXAM: RENAL / URINARY TRACT ULTRASOUND COMPLETE COMPARISON:  None Available. FINDINGS: Right Kidney: Renal measurements: 10.2 x 5.3 x 5.1 cm = volume: 142.5 mL. Cortex appears slightly echogenic. No hydronephrosis. Echogenic solid mass upper pole right kidney measuring 10 x 9 x 10 mm. Left Kidney: Renal measurements: 9.5 x 5.5 x 4.6 cm = volume: 124.7 mL. Echogenicity within normal limits. No mass or hydronephrosis Bladder: Appears normal for degree of bladder distention. Other: None. IMPRESSION: 1. No hydronephrosis. Slightly echogenic right renal cortex as may be seen with medical renal disease. 2. 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 Recommend follow-up ultrasound in 6 months. Electronically Signed   By: Esmeralda Hedge M.D.   On: 06/17/2023 23:03   DG Chest Port 1 View Result Date: 06/17/2023 CLINICAL DATA:  Fatigue and malaise for 3 days. EXAM: PORTABLE CHEST 1 VIEW COMPARISON:  09/07/2011. FINDINGS: Low lung volume. Bilateral lung fields are clear. Bilateral costophrenic angles are clear. Mildly enlarged cardio-mediastinal silhouette, likely accentuated by low lung volume and AP technique. No acute osseous abnormalities. The soft tissues are within normal limits. IMPRESSION: No active disease. Electronically Signed   By: Beula Brunswick M.D.   On: 06/17/2023 12:38        Scheduled Meds:  amLODipine   5 mg Oral QHS   aspirin  EC  81 mg Oral Daily   atorvastatin   80 mg Oral Daily   dorzolamide-timolol  1 drop Both Eyes BID   ezetimibe   10 mg Oral Daily   heparin  5,000 Units Subcutaneous Q8H   latanoprost  1 drop Both Eyes QHS   Continuous Infusions:    LOS: 0 days    Time spent: 45 minutes spent on chart review, discussion with nursing staff, consultants, updating family and interview/physical exam; more than 50% of that time was spent in counseling and/or coordination of care.    Enrigue Harvard, DO [facility] Available via Epic secure chat 7am-7pm After these hours, please refer to coverage provider listed on [email or web address] 06/18/2023, 11:14 AM

## 2023-06-19 DIAGNOSIS — N179 Acute kidney failure, unspecified: Secondary | ICD-10-CM | POA: Diagnosis not present

## 2023-06-19 DIAGNOSIS — N390 Urinary tract infection, site not specified: Secondary | ICD-10-CM | POA: Diagnosis not present

## 2023-06-19 DIAGNOSIS — I10 Essential (primary) hypertension: Secondary | ICD-10-CM | POA: Diagnosis not present

## 2023-06-19 DIAGNOSIS — R809 Proteinuria, unspecified: Secondary | ICD-10-CM | POA: Diagnosis not present

## 2023-06-19 DIAGNOSIS — E876 Hypokalemia: Secondary | ICD-10-CM | POA: Diagnosis not present

## 2023-06-19 DIAGNOSIS — R319 Hematuria, unspecified: Secondary | ICD-10-CM | POA: Diagnosis not present

## 2023-06-19 LAB — PROTEIN / CREATININE RATIO, URINE
Creatinine, Urine: 156 mg/dL
Protein Creatinine Ratio: 2.53 mg/mg{creat} — ABNORMAL HIGH (ref 0.00–0.15)
Total Protein, Urine: 394 mg/dL

## 2023-06-19 LAB — BASIC METABOLIC PANEL WITH GFR
Anion gap: 6 (ref 5–15)
BUN: 42 mg/dL — ABNORMAL HIGH (ref 8–23)
CO2: 16 mmol/L — ABNORMAL LOW (ref 22–32)
Calcium: 7 mg/dL — ABNORMAL LOW (ref 8.9–10.3)
Chloride: 115 mmol/L — ABNORMAL HIGH (ref 98–111)
Creatinine, Ser: 2.96 mg/dL — ABNORMAL HIGH (ref 0.44–1.00)
GFR, Estimated: 16 mL/min — ABNORMAL LOW (ref >60)
Glucose, Bld: 120 mg/dL — ABNORMAL HIGH (ref 70–99)
Potassium: 3.1 mmol/L — ABNORMAL LOW (ref 3.5–5.1)
Sodium: 137 mmol/L (ref 135–145)

## 2023-06-19 LAB — CK: Total CK: 516 U/L — ABNORMAL HIGH (ref 38–234)

## 2023-06-19 MED ORDER — SODIUM CHLORIDE 0.9 % IV SOLN: INTRAVENOUS | Status: AC

## 2023-06-19 MED ORDER — SODIUM CHLORIDE 0.9 % IV SOLN: INTRAVENOUS | Status: DC

## 2023-06-19 MED ORDER — POTASSIUM CHLORIDE CRYS ER 20 MEQ PO TBCR
40.0000 meq | EXTENDED_RELEASE_TABLET | Freq: Once | ORAL | Status: AC
  Administered 2023-06-19: 40 meq via ORAL
  Filled 2023-06-19: qty 2

## 2023-06-19 MED ORDER — SODIUM CHLORIDE 0.9 % IV SOLN
1.0000 g | INTRAVENOUS | Status: AC
  Administered 2023-06-19 – 2023-06-25 (×7): 1 g via INTRAVENOUS
  Filled 2023-06-19 (×7): qty 10

## 2023-06-19 NOTE — Plan of Care (Signed)

## 2023-06-19 NOTE — Progress Notes (Signed)
 PROGRESS NOTE    ARIELE VIDRIO  ZOX:[id number] DOB: 1945-09-01 DOA: 06/17/2023 PCP: Cleave Curling, MD    Brief Narrative:  Marissa ERKKILA is a 78 y.o. female with medical history significant of  for hx of CVA, HTN, HLD, hx of kidney stones who presented to ED with complaints of low blood pressure and fatigue. She started to feel tired on Tuesday and stayed in bed all day. No other symptoms. She did check her blood pressure and it was around 110/70. She still was fatigued over the following 2 days. She has had poor PO intake over past several days as well.  Her blood pressure today when she took it was 86/61. She called her PCP and they told her to go hospital.     Assessment and Plan: * AKI (acute kidney injury) (HCC) -baseline creatinine normal, presenting with creatinine of 2.79-  - Repeat UA with continued elevated amounts of protein and creatinine is not improved - Strict I's and O's have been ordered but not kept have instructed nursing and patient again to keep strict I's and O's -hx of kidney stones years ago. She has no CVA tenderness/pain.  Continue IV fluids - Renal consult  HTN (hypertension), malignant -Blood pressure has been normotensive while in hospital setting  -hold valsartan  with AKI   Hyperlipidemia with target low density lipoprotein (LDL) cholesterol less than 70 mg/dL Continue high intensity statin daily  Continue zetia  daily   History of stroke Continue high intensity statin and ASA   Hypokalemia - Replete        DVT prophylaxis: heparin injection 5,000 Units Start: 06/17/23 2215    Code Status: Full Code  Disposition Plan:  Level of care: Med-Surg      Consultants:  Renal  Subjective: Says she has been urinating enough  Objective: Vitals:   06/18/23 1233 06/18/23 1955 06/18/23 2140 06/19/23 0426  BP: (!) 141/78 127/74 121/71 121/77  Pulse: 73 62 81 83  Resp: 18 18  18   Temp: 98.4 F (36.9 C) 98.4 F (36.9 C)  98.8 F  (37.1 C)  TempSrc: Oral Oral  Oral  SpO2: 97% 98%  99%  Weight:      Height:        Intake/Output Summary (Last 24 hours) at 06/19/2023 1057 Last data filed at 06/19/2023 0118 Gross per 24 hour  Intake 1245 ml  Output --  Net 1245 ml   Filed Weights   06/17/23 1756  Weight: 76.2 kg    Examination:   General: Appearance:     Overweight female in no acute distress     Lungs:     respirations unlabored  Heart:    Normal heart rate.    MS:   All extremities are intact.    Neurologic:   Awake, alert       Data Reviewed: I have personally reviewed following labs and imaging studies  CBC: Recent Labs  Lab 06/17/23 1238 06/18/23 0541  WBC 9.9 7.7  HGB 14.4 13.0  HCT 42.5 38.9  MCV 87.6 90.9  PLT 165 153   Basic Metabolic Panel: Recent Labs  Lab 06/17/23 1238 06/18/23 0541 06/19/23 0536  NA 137 136 137  K 4.4 3.5 3.1*  CL 105 112* 115*  CO2 17* 18* 16*  GLUCOSE 90 140* 120*  BUN 30* 37* 42*  CREATININE 2.79* 2.71* 2.96*  CALCIUM  8.5* 7.2* 7.0*   GFR: Estimated Creatinine Clearance: 15.1 mL/min (A) (by C-G formula based on SCr of  2.96 mg/dL (H)). Liver Function Tests: No results for input(s): AST, ALT, ALKPHOS, BILITOT, PROT, ALBUMIN in the last 168 hours. No results for input(s): LIPASE, AMYLASE in the last 168 hours. No results for input(s): AMMONIA in the last 168 hours. Coagulation Profile: No results for input(s): INR, PROTIME in the last 168 hours. Cardiac Enzymes: No results for input(s): CKTOTAL, CKMB, CKMBINDEX, TROPONINI in the last 168 hours. BNP (last 3 results) No results for input(s): PROBNP in the last 8760 hours. HbA1C: No results for input(s): HGBA1C in the last 72 hours. CBG: No results for input(s): GLUCAP in the last 168 hours. Lipid Profile: No results for input(s): CHOL, HDL, LDLCALC, TRIG, CHOLHDL, LDLDIRECT in the last 72 hours. Thyroid  Function Tests: Recent Labs     06/17/23 1238  TSH 2.740   Anemia Panel: No results for input(s): VITAMINB12, FOLATE, FERRITIN, TIBC, IRON, RETICCTPCT in the last 72 hours. Sepsis Labs: No results for input(s): PROCALCITON, LATICACIDVEN in the last 168 hours.  Recent Results (from the past 240 hours)  Urine Culture (for pregnant, neutropenic or urologic patients or patients with an indwelling urinary catheter)     Status: Abnormal (Preliminary result)   Collection Time: 06/17/23 10:24 PM   Specimen: Urine, Clean Catch  Result Value Ref Range Status   Specimen Description   Final    URINE, CLEAN CATCH Performed at [facility], [address], [place], [place] [address]    Special Requests   Final    NONE Performed at [facility], [address] [place], [place] [address]    Culture (A)  Final    >=100,000 COLONIES/mL GRAM NEGATIVE RODS SUSCEPTIBILITIES TO FOLLOW Performed at [facility], [address], [place], [place] [address]    Report Status PENDING  Incomplete         Radiology Studies: US  RENAL Result Date: 06/17/2023 CLINICAL DATA:  Acute kidney injury EXAM: RENAL / URINARY TRACT ULTRASOUND COMPLETE COMPARISON:  None Available. FINDINGS: Right Kidney: Renal measurements: 10.2 x 5.3 x 5.1 cm = volume: 142.5 mL. Cortex appears slightly echogenic. No hydronephrosis. Echogenic solid mass upper pole right kidney measuring 10 x 9 x 10 mm. Left Kidney: Renal measurements: 9.5 x 5.5 x 4.6 cm = volume: 124.7 mL. Echogenicity within normal limits. No mass or hydronephrosis Bladder: Appears normal for degree of bladder distention. Other: None. IMPRESSION: 1. No hydronephrosis. Slightly echogenic right renal cortex as may be seen with medical renal disease. 2. 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 Recommend follow-up ultrasound in 6 months. Electronically Signed   By: Esmeralda Hedge M.D.   On: 06/17/2023 23:03   DG  Chest Port 1 View Result Date: 06/17/2023 CLINICAL DATA:  Fatigue and malaise for 3 days. EXAM: PORTABLE CHEST 1 VIEW COMPARISON:  09/07/2011. FINDINGS: Low lung volume. Bilateral lung fields are clear. Bilateral costophrenic angles are clear. Mildly enlarged cardio-mediastinal silhouette, likely accentuated by low lung volume and AP technique. No acute osseous abnormalities. The soft tissues are within normal limits. IMPRESSION: No active disease. Electronically Signed   By: Beula Brunswick M.D.   On: 06/17/2023 12:38        Scheduled Meds:  amLODipine   5 mg Oral QHS   aspirin  EC  81 mg Oral Daily   atorvastatin   80 mg Oral Daily   dorzolamide-timolol  1 drop Both Eyes BID   ezetimibe   10 mg Oral Daily   heparin  5,000 Units Subcutaneous Q8H  latanoprost  1 drop Both Eyes QHS   Continuous Infusions:  sodium chloride  75 mL/hr at 06/19/23 0859     LOS: 1 day    Time spent: 45 minutes spent on chart review, discussion with nursing staff, consultants, updating family and interview/physical exam; more than 50% of that time was spent in counseling and/or coordination of care.    Enrigue Harvard, DO [facility] Available via Epic secure chat 7am-7pm After these hours, please refer to coverage provider listed on [email or web address] 06/19/2023, 10:57 AM

## 2023-06-19 NOTE — Plan of Care (Addendum)
 VSS. No c/o pain. IV fluids discontinued per order. LBM 6/14. No acute events overnight.  Problem: Education: Goal: Knowledge of General Education information will improve Description: Including pain rating scale, medication(s)/side effects and non-pharmacologic comfort measures Outcome: Progressing   Problem: Health Behavior/Discharge Planning: Goal: Ability to manage health-related needs will improve Outcome: Progressing   Problem: Clinical Measurements: Goal: Ability to maintain clinical measurements within normal limits will improve Outcome: Progressing Goal: Will remain free from infection Outcome: Progressing   Problem: Safety: Goal: Ability to remain free from injury will improve Outcome: Progressing   Problem: Urinary Elimination: Goal: Signs and symptoms of infection will decrease Outcome: Progressing

## 2023-06-19 NOTE — Consult Note (Signed)
 [facility] Renal Consultation Note  Requesting MD: Katrina Parma, DO Indication for Consultation:  AKI   Chief complaint:fatigue and low blood pressure  HPI:  Marissa Orozco is a 78 y.o. female with a history of prior stroke, hypertension, and prior nephrolithiasis who presented to the hospital with report of low blood pressure and fatigue.  She was so tired Wednesday she didn't get out of bed.  Then the next day went to aerobics.  Then following day she reported hypotension at home to the 80s systolic to her PCP and her PCP then directed her to the ER.  Her creatinine was 2.79 on 6/13 on presentation and was 2.96 on 6/15 on the date of consult.  She is on amlodipine  and valsartan  at home.  Strict ins and outs are not available.  She had 3 unmeasured urine voids over 6/14 and had 100 mL of urine charted thus far on 6/15.  Nephrology is consulted for assistance with management of AKI.  She has been hydrated here for what appears to be 12 hours on a couple of occasions; overall limited duration of hydration.  She had a renal ultrasound with no hydronephrosis and note of slightly echogenic right renal cortex.  There was also an incidental solid mass in the upper pole of the right kidney which was felt to represent a probable angiomyolipoma.  They recommended a follow-up ultrasound in 6 months.    Creatinine, Ser  Date/Time Value Ref Range Status  06/19/2023 05:36 AM 2.96 (H) 0.44 - 1.00 mg/dL Final  16/10/9602 54:09 AM 2.71 (H) 0.44 - 1.00 mg/dL Final  81/19/1478 29:56 PM 2.79 (H) 0.44 - 1.00 mg/dL Final  21/30/8657 84:69 AM 0.71 0.57 - 1.00 mg/dL Final  62/95/2841 32:44 AM 0.72 0.57 - 1.00 mg/dL Final  01/06/7251 66:44 AM 0.66 0.57 - 1.00 mg/dL Final  03/47/4259 56:38 AM 0.81 0.57 - 1.00 mg/dL Final  75/64/3329 51:88 PM 0.72 0.57 - 1.00 mg/dL Final  41/66/0630 16:01 AM 0.83 0.57 - 1.00 mg/dL Final  09/32/3557 32:20 AM 0.85 0.57 - 1.00 mg/dL Final  25/42/7062 37:62 AM 0.79  0.57 - 1.00 mg/dL Final  83/15/1761 60:73 AM 0.82 0.57 - 1.00 mg/dL Final  71/06/2692 85:46 AM 0.71 0.57 - 1.00 mg/dL Final  27/03/5007 38:18 AM 0.76 0.57 - 1.00 mg/dL Final  29/93/7169 67:89 AM 0.80 0.57 - 1.00 mg/dL Final  38/10/1749 02:58 AM 0.76 0.57 - 1.00 mg/dL Final  52/77/8242 35:36 AM 0.83 0.57 - 1.00 mg/dL Final  14/43/1540 08:67 AM 0.76 0.57 - 1.00 mg/dL Final  61/95/0932 67:12 AM 0.78 0.57 - 1.00 mg/dL Final  45/80/9983 38:25 PM 0.67 0.50 - 1.10 mg/dL Final  05/39/7673 41:93 PM 0.92  Final     PMHx:   Past Medical History:  Diagnosis Date   Allergy 2000   Breast lump    Cataract    Colon polyps    Fibroids    GERD (gastroesophageal reflux disease)    Glaucoma    Heart murmur    Hyperlipidemia 01/05/1984   Hypertension 01/05/1984   Interstitial cystitis 01/04/1997   Kidney stones 01/05/2003   Neuromuscular disorder (HCC)    Osteoporosis    Preop cardiovascular exam 10/25/2022   Sleep apnea 2021   Tonsillitis     Past Surgical History:  Procedure Laterality Date   ABDOMINAL HYSTERECTOMY  1997   BREAST EXCISIONAL BIOPSY Right 07/27/2006   Intraductal Papilloma   BREAST SURGERY  2008   CATARACT EXTRACTION Bilateral 02/2020  other in 05/2020   COLONOSCOPY  2010   Dr Tova Fresh   EYE SURGERY  2022   HYSTEROSCOPY  1993   LAPAROSCOPIC HYSTERECTOMY  1994   MYOMECTOMY  1993   excision of tumor   POLYPECTOMY     TONSILLECTOMY  1953   TUBAL LIGATION  1980    Family Hx:  Family History  Problem Relation Age of Onset   Hypertension Mother    Heart disease Mother    Colon cancer Mother 52   Arthritis Mother    Cancer Mother    Coronary artery disease Father        mother, PGM   Stroke Father    Hypertension Father    Colon cancer Father 22   Cancer Father    Colon cancer Maternal Grandmother    Diabetes Other    Breast cancer Neg Hx    Colon polyps Neg Hx    Esophageal cancer Neg Hx    Stomach cancer Neg Hx    Rectal cancer Neg Hx    BRCA 1/2 Neg  Hx     Social History:  reports that she has never smoked. She has never used smokeless tobacco. She reports current alcohol use of about 5.0 standard drinks of alcohol per week. She reports that she does not use drugs.  Allergies:  Allergies  Allergen Reactions   Hydrochlorothiazide Itching    Medications: Prior to Admission medications   Medication Sig Start Date End Date Taking? Authorizing Provider  amLODipine  (NORVASC ) 5 MG tablet TAKE 1 TABLET BY MOUTH DAILY Patient taking differently: Take 5 mg by mouth at bedtime. 11/22/22  Yes Cleave Curling, MD  aspirin  EC 81 MG tablet Take 81 mg by mouth daily. Swallow whole.   Yes [provider]  atorvastatin  (LIPITOR) 80 MG tablet TAKE 1 TABLET BY MOUTH DAILY 09/08/22  Yes Cleave Curling, MD  Calcium  Carbonate (CALCIUM  500 PO) Take 500 mg by mouth daily with breakfast.   Yes [provider]  Cholecalciferol (VITAMIN D3) 50 MCG (2000 UT) TABS Take 2,000 Units by mouth daily with breakfast.   Yes [provider]  dorzolamide-timolol (COSOPT) 22.3-6.8 MG/ML ophthalmic solution Place 1 drop into both eyes in the morning and at bedtime. 02/02/20  Yes [provider]  ezetimibe  (ZETIA ) 10 MG tablet Take 1 tablet (10 mg total) by mouth daily. 02/16/23  Yes Cleave Curling, MD  finasteride (PROSCAR) 5 MG tablet Take 5 mg by mouth daily.   Yes [provider]  latanoprost (XALATAN) 0.005 % ophthalmic solution Place 1 drop into both eyes at bedtime. 03/17/13  Yes [provider]  valsartan  (DIOVAN ) 160 MG tablet Take 1 tablet (160 mg total) by mouth daily. 02/16/23 02/16/24 Yes Cleave Curling, MD    I have reviewed the patient's current and reported prior to admission medications.  Labs:     Latest Ref Rng & Units 06/19/2023    5:36 AM 06/18/2023    5:41 AM 06/17/2023   12:38 PM  BMP  Glucose 70 - 99 mg/dL 621  308  90   BUN 8 - 23 mg/dL 42  37  30   Creatinine 0.44 - 1.00 mg/dL 6.57  8.46  9.62    Sodium 135 - 145 mmol/L 137  136  137   Potassium 3.5 - 5.1 mmol/L 3.1  3.5  4.4   Chloride 98 - 111 mmol/L 115  112  105   CO2 22 - 32 mmol/L 16  18  17   Calcium  8.9 - 10.3 mg/dL 7.0  7.2  8.5     Urinalysis    Component Value Date/Time   COLORURINE AMBER (A) 06/18/2023 1428   APPEARANCEUR CLOUDY (A) 06/18/2023 1428   LABSPEC 1.013 06/18/2023 1428   PHURINE 5.0 06/18/2023 1428   GLUCOSEU 150 (A) 06/18/2023 1428   HGBUR MODERATE (A) 06/18/2023 1428   BILIRUBINUR NEGATIVE 06/18/2023 1428   BILIRUBINUR negative 04/02/2021 1119   KETONESUR NEGATIVE 06/18/2023 1428   PROTEINUR >=300 (A) 06/18/2023 1428   UROBILINOGEN 0.2 04/02/2021 1119   NITRITE NEGATIVE 06/18/2023 1428   LEUKOCYTESUR LARGE (A) 06/18/2023 1428     ROS:  Pertinent items noted in HPI and remainder of comprehensive ROS otherwise negative.  Physical Exam: Vitals:   06/19/23 0426 06/19/23 1342  BP: 121/77 (!) 145/70  Pulse: 83 77  Resp: 18 18  Temp: 98.8 F (37.1 C) 98.3 F (36.8 C)  SpO2: 99% 100%     General adult female in bed in no acute distress HEENT normocephalic atraumatic extraocular movements intact sclera anicteric Neck supple trachea midline Lungs clear to auscultation bilaterally normal work of breathing at rest on room air Heart S1S2 no rub Abdomen soft nontender nondistended Extremities no edema; no cyanosis or clubbing  Psych normal mood and affect Neuro alert and oriented x 3 provides hx and follows commands  GU no foley  Assessment/Plan:  # AKI - Ischemic and prerenal insults - hypotension at home, ARB use, and setting of UTI.  Note concurrent microscopic hematuria and subnephrotic range proteinuria.  Baseline Cr < 1 - Remain off of home ARB at this time - Normal saline at 75 mL an hour for 24 hours and reassess - I have ordered strict ins and outs - Daily weights - Check CK - Check ANA, ANCA, and complement  - Check SPEP, Free light chains  # Hypertension - As above,  remain off of ARB for now in the setting of AKI - Noted report of hypotension prior to admission - Acceptable on current regimen   # Proteinuria  - up/cr ratio 2530 mg/g  - GN w/u as above - off of ARB for AKI     # Microscopic hematuria  - Note UTI which may explain - abx per primary team  - GN w/u as above    # UTI - E coli UTI - she is not on abx  - Will start ceftriaxone daily - will need 1 week of therapy for complicated UTI with hematuria   # Pre-DM - Note A1c 6.2 on 06/02/23 - Optimizing pre-DM can reduce risk of CKD progression  # Angiomyolipoma - Probable angiomyolipoma on renal US  - Radiology recommended a follow-up renal ultrasound in 6 months  # Hypokalemia - repleted   Thank you for the consult.  Please do not hesitate to contact me with any questions regarding our patient.  She will need to follow-up with nephrology on discharge.  We will follow along inpatient for now   Nan Aver 06/19/2023, 7:29 PM

## 2023-06-20 DIAGNOSIS — N179 Acute kidney failure, unspecified: Secondary | ICD-10-CM | POA: Diagnosis not present

## 2023-06-20 LAB — CBC
HCT: 37.6 % (ref 36.0–46.0)
Hemoglobin: 12.5 g/dL (ref 12.0–15.0)
MCH: 30.1 pg (ref 26.0–34.0)
MCHC: 33.2 g/dL (ref 30.0–36.0)
MCV: 90.6 fL (ref 80.0–100.0)
Platelets: 132 10*3/uL — ABNORMAL LOW (ref 150–400)
RBC: 4.15 MIL/uL (ref 3.87–5.11)
RDW: 18.6 % — ABNORMAL HIGH (ref 11.5–15.5)
WBC: 10.1 10*3/uL (ref 4.0–10.5)
nRBC: 0 % (ref 0.0–0.2)

## 2023-06-20 LAB — BASIC METABOLIC PANEL WITH GFR
Anion gap: 7 (ref 5–15)
BUN: 45 mg/dL — ABNORMAL HIGH (ref 8–23)
CO2: 15 mmol/L — ABNORMAL LOW (ref 22–32)
Calcium: 6.9 mg/dL — ABNORMAL LOW (ref 8.9–10.3)
Chloride: 117 mmol/L — ABNORMAL HIGH (ref 98–111)
Creatinine, Ser: 3.3 mg/dL — ABNORMAL HIGH (ref 0.44–1.00)
GFR, Estimated: 14 mL/min — ABNORMAL LOW (ref >60)
Glucose, Bld: 137 mg/dL — ABNORMAL HIGH (ref 70–99)
Potassium: 3.5 mmol/L (ref 3.5–5.1)
Sodium: 139 mmol/L (ref 135–145)

## 2023-06-20 LAB — URINE CULTURE: Culture: >=100000 — AB

## 2023-06-20 MED ORDER — DOCUSATE SODIUM 100 MG PO CAPS: 100.0000 mg | ORAL_CAPSULE | Freq: Two times a day (BID) | ORAL | Status: DC | PRN

## 2023-06-20 MED ORDER — DOCUSATE SODIUM 100 MG PO CAPS
100.0000 mg | ORAL_CAPSULE | Freq: Two times a day (BID) | ORAL | Status: DC
  Administered 2023-06-21 – 2023-07-07 (×27): 100 mg via ORAL
  Filled 2023-06-20 (×33): qty 1

## 2023-06-20 MED ORDER — POLYETHYLENE GLYCOL 3350 17 G PO PACK
17.0000 g | PACK | Freq: Every day | ORAL | Status: DC | PRN
  Administered 2023-06-20 – 2023-07-05 (×3): 17 g via ORAL
  Filled 2023-06-20 (×4): qty 1

## 2023-06-20 MED ORDER — SODIUM CHLORIDE 0.9 % IV SOLN: INTRAVENOUS | Status: DC

## 2023-06-20 MED ORDER — SODIUM CHLORIDE 0.9 % IV BOLUS: 500.0000 mL | Freq: Once | INTRAVENOUS | Status: DC

## 2023-06-20 NOTE — Progress Notes (Signed)
 PROGRESS NOTE    Marissa Orozco  WUJ:[id number] DOB: 1945-12-30 DOA: 06/17/2023 PCP: Cleave Curling, MD    Brief Narrative:  Marissa Orozco is a 78 y.o. female with medical history significant of  for hx of CVA, HTN, HLD, hx of kidney stones who presented to ED with complaints of low blood pressure and fatigue. She started to feel tired on Tuesday and stayed in bed all day. No other symptoms. She did check her blood pressure and it was around 110/70. She still was fatigued over the following 2 days. She has had poor PO intake over past several days as well.  Her blood pressure today when she took it was 86/61. She called her PCP and they told her to go hospital.  Creatinine did not respond to IV fluids and blood pressure support so renal was consulted workup underway   Assessment and Plan: * AKI (acute kidney injury) (HCC) -baseline creatinine normal, presenting with creatinine of 2.79-  - Repeat UA with continued elevated amounts of protein and creatinine is not improved-there is also question of a UTI so antibiotics started - Strict I's and O's have been ordered but not kept have instructed nursing and patient again to keep strict I's and O's -hx of kidney stones years ago. She has no CVA tenderness/pain.  Continue IV fluids - Renal consult appreciated: Multiple labs pending  HTN (hypertension), malignant -Blood pressure has been normotensive while in hospital setting  -hold valsartan  with AKI   Hyperlipidemia with target low density lipoprotein (LDL) cholesterol less than 70 mg/dL Continue high intensity statin daily  Continue zetia  daily   History of stroke Continue high intensity statin and ASA   Hypokalemia - Replete        DVT prophylaxis: heparin injection 5,000 Units Start: 06/17/23 2215    Code Status: Full Code  Disposition Plan:  Level of care: Med-Surg      Consultants:  Renal  Subjective: Some numbness in her fingertips  Objective: Vitals:    06/19/23 0426 06/19/23 1342 06/19/23 1954 06/20/23 0538  BP: 121/77 (!) 145/70 126/69 119/64  Pulse: 83 77 81 76  Resp: 18 18 20 20   Temp: 98.8 F (37.1 C) 98.3 F (36.8 C) 98.7 F (37.1 C) 98 F (36.7 C)  TempSrc: Oral Oral Oral Oral  SpO2: 99% 100% 100% 98%  Weight:    76.2 kg  Height:        Intake/Output Summary (Last 24 hours) at 06/20/2023 1105 Last data filed at 06/20/2023 0615 Gross per 24 hour  Intake 1599.24 ml  Output 600 ml  Net 999.24 ml   Filed Weights   06/17/23 1756 06/20/23 0538  Weight: 76.2 kg 76.2 kg    Examination:   General: Appearance:     Overweight female in no acute distress     Lungs:     respirations unlabored  Heart:    Normal heart rate.    MS:   All extremities are intact.    Neurologic:   Awake, alert       Data Reviewed: I have personally reviewed following labs and imaging studies  CBC: Recent Labs  Lab 06/17/23 1238 06/18/23 0541 06/20/23 0503  WBC 9.9 7.7 10.1  HGB 14.4 13.0 12.5  HCT 42.5 38.9 37.6  MCV 87.6 90.9 90.6  PLT 165 153 132*   Basic Metabolic Panel: Recent Labs  Lab 06/17/23 1238 06/18/23 0541 06/19/23 0536 06/20/23 0503  NA 137 136 137 139  K 4.4 3.5  3.1* 3.5  CL 105 112* 115* 117*  CO2 17* 18* 16* 15*  GLUCOSE 90 140* 120* 137*  BUN 30* 37* 42* 45*  CREATININE 2.79* 2.71* 2.96* 3.30*  CALCIUM  8.5* 7.2* 7.0* 6.9*   GFR: Estimated Creatinine Clearance: 13.5 mL/min (A) (by C-G formula based on SCr of 3.3 mg/dL (H)). Liver Function Tests: No results for input(s): AST, ALT, ALKPHOS, BILITOT, PROT, ALBUMIN in the last 168 hours. No results for input(s): LIPASE, AMYLASE in the last 168 hours. No results for input(s): AMMONIA in the last 168 hours. Coagulation Profile: No results for input(s): INR, PROTIME in the last 168 hours. Cardiac Enzymes: Recent Labs  Lab 06/19/23 2019  CKTOTAL 516*   BNP (last 3 results) No results for input(s): PROBNP in the last 8760  hours. HbA1C: No results for input(s): HGBA1C in the last 72 hours. CBG: No results for input(s): GLUCAP in the last 168 hours. Lipid Profile: No results for input(s): CHOL, HDL, LDLCALC, TRIG, CHOLHDL, LDLDIRECT in the last 72 hours. Thyroid  Function Tests: Recent Labs    06/17/23 1238  TSH 2.740   Anemia Panel: No results for input(s): VITAMINB12, FOLATE, FERRITIN, TIBC, IRON, RETICCTPCT in the last 72 hours. Sepsis Labs: No results for input(s): PROCALCITON, LATICACIDVEN in the last 168 hours.  Recent Results (from the past 240 hours)  Urine Culture (for pregnant, neutropenic or urologic patients or patients with an indwelling urinary catheter)     Status: Abnormal   Collection Time: 06/17/23 10:24 PM   Specimen: Urine, Clean Catch  Result Value Ref Range Status   Specimen Description   Final    URINE, CLEAN CATCH Performed at [facility], [address], [place], [place] [address]    Special Requests   Final    NONE Performed at [facility], [address], [place], [place] [address]    Culture >=100,000 COLONIES/mL ESCHERICHIA COLI (A)  Final   Report Status 06/20/2023 FINAL  Final   Organism ID, Bacteria ESCHERICHIA COLI (A)  Final      Susceptibility   Escherichia coli - MIC*    AMPICILLIN <=2 SENSITIVE Sensitive     CEFAZOLIN <=4 SENSITIVE Sensitive     CEFEPIME <=0.12 SENSITIVE Sensitive     CEFTRIAXONE <=0.25 SENSITIVE Sensitive     CIPROFLOXACIN <=0.25 SENSITIVE Sensitive     GENTAMICIN <=1 SENSITIVE Sensitive     IMIPENEM <=0.25 SENSITIVE Sensitive     NITROFURANTOIN  <=16 SENSITIVE Sensitive     TRIMETH/SULFA <=20 SENSITIVE Sensitive     AMPICILLIN/SULBACTAM <=2 SENSITIVE Sensitive     PIP/TAZO <=4 SENSITIVE Sensitive ug/mL    * >=100,000 COLONIES/mL ESCHERICHIA COLI         Radiology Studies: No results found.       Scheduled Meds:  amLODipine   5 mg Oral QHS   aspirin   EC  81 mg Oral Daily   docusate sodium  100 mg Oral BID   dorzolamide-timolol  1 drop Both Eyes BID   ezetimibe   10 mg Oral Daily   heparin  5,000 Units Subcutaneous Q8H   latanoprost  1 drop Both Eyes QHS   Continuous Infusions:  sodium chloride  75 mL/hr at 06/20/23 1018   cefTRIAXone (ROCEPHIN)  IV 1 g (06/19/23 2033)     LOS: 2 days    Time spent: 45 minutes spent on chart review, discussion with nursing staff, consultants, updating family and interview/physical exam; more than 50% of that time was spent in counseling and/or  coordination of care.    Enrigue Harvard, DO [facility] Available via Epic secure chat 7am-7pm After these hours, please refer to coverage provider listed on [email or web address] 06/20/2023, 11:05 AM

## 2023-06-20 NOTE — Plan of Care (Signed)

## 2023-06-20 NOTE — Progress Notes (Signed)
 [facility] Progress Note   Assessment/ Plan:   # AKI - Ischemic and prerenal insults - hypotension at home, ARB use, and setting of UTI.  Note concurrent microscopic hematuria and subnephrotic range proteinuria.  Baseline Cr < 1 - Remain off of home ARB at this time - I have ordered strict ins and outs - Daily weights - Check CK - Check ANA, ANCA, and complement  - Check SPEP, Free light chains - may need abiopsy but will await serologies/ clinical improvement first.   # Hypertension - As above, remain off of ARB for now in the setting of AKI - Noted report of hypotension prior to admission - Acceptable on current regimen    # Proteinuria  - up/cr ratio 2530 mg/g  - GN w/u as above - off of ARB for AKI      # Microscopic hematuria  - Note UTI which may explain - abx per primary team  - GN w/u as above     # UTI - E coli UTI - she is not on abx  - Will start ceftriaxone daily - will need 1 week of therapy for complicated UTI with hematuria    # Pre-DM - Note A1c 6.2 on 06/02/23 - Optimizing pre-DM can reduce risk of CKD progression   # Angiomyolipoma - Probable angiomyolipoma on renal US  - Radiology recommended a follow-up renal ultrasound in 6 months   # Hypokalemia - repleted   Subjective:    Seen in room.  Doing ok for now, husband at bedside.  Cr still rising.     Objective:   BP 102/75 (BP Location: Right Arm)   Pulse 71   Temp 97.6 F (36.4 C) (Oral)   Resp 20   Ht 5' 3 (1.6 m)   Wt 76.2 kg   SpO2 98%   BMI 29.76 kg/m   Intake/Output Summary (Last 24 hours) at 06/20/2023 1331 Last data filed at 06/20/2023 1251 Gross per 24 hour  Intake 2079.24 ml  Output 800 ml  Net 1279.24 ml   Weight change:   Physical Exam: Gen:NAD CVS: RRR Resp: clear QIO:NGEX Ext:no LE edema  Imaging: No results found.  Labs: BMET Recent Labs  Lab 06/17/23 1238 06/18/23 0541 06/19/23 0536 06/20/23 0503  NA 137 136 137 139  K 4.4 3.5 3.1*  3.5  CL 105 112* 115* 117*  CO2 17* 18* 16* 15*  GLUCOSE 90 140* 120* 137*  BUN 30* 37* 42* 45*  CREATININE 2.79* 2.71* 2.96* 3.30*  CALCIUM  8.5* 7.2* 7.0* 6.9*   CBC Recent Labs  Lab 06/17/23 1238 06/18/23 0541 06/20/23 0503  WBC 9.9 7.7 10.1  HGB 14.4 13.0 12.5  HCT 42.5 38.9 37.6  MCV 87.6 90.9 90.6  PLT 165 153 132*    Medications:     amLODipine   5 mg Oral QHS   aspirin  EC  81 mg Oral Daily   docusate sodium  100 mg Oral BID   dorzolamide-timolol  1 drop Both Eyes BID   ezetimibe   10 mg Oral Daily   heparin  5,000 Units Subcutaneous Q8H   latanoprost  1 drop Both Eyes QHS    Leandra Pro MD 06/20/2023, 1:31 PM

## 2023-06-20 NOTE — Plan of Care (Signed)
  Problem: Education: Goal: Knowledge of General Education information will improve Description: Including pain rating scale, medication(s)/side effects and non-pharmacologic comfort measures Outcome: Progressing   Problem: Health Behavior/Discharge Planning: Goal: Ability to manage health-related needs will improve Outcome: Progressing   Problem: Clinical Measurements: Goal: Ability to maintain clinical measurements within normal limits will improve Outcome: Progressing Goal: Will remain free from infection Outcome: Progressing Goal: Respiratory complications will improve Outcome: Progressing Goal: Cardiovascular complication will be avoided Outcome: Progressing   Problem: Coping: Goal: Level of anxiety will decrease Outcome: Progressing   Problem: Elimination: Goal: Will not experience complications related to bowel motility Outcome: Progressing Goal: Will not experience complications related to urinary retention Outcome: Progressing   Problem: Pain Managment: Goal: General experience of comfort will improve and/or be controlled Outcome: Progressing   Problem: Safety: Goal: Ability to remain free from injury will improve Outcome: Progressing   Problem: Skin Integrity: Goal: Risk for impaired skin integrity will decrease Outcome: Progressing   Problem: Clinical Measurements: Goal: Diagnostic test results will improve Outcome: Not Progressing   Problem: Activity: Goal: Risk for activity intolerance will decrease Outcome: Not Progressing   Problem: Nutrition: Goal: Adequate nutrition will be maintained Outcome: Not Progressing   Problem: Urinary Elimination: Goal: Signs and symptoms of infection will decrease Outcome: Not Progressing

## 2023-06-21 DIAGNOSIS — N179 Acute kidney failure, unspecified: Secondary | ICD-10-CM | POA: Diagnosis not present

## 2023-06-21 LAB — KAPPA/LAMBDA LIGHT CHAINS
Kappa free light chain: 78 mg/L — ABNORMAL HIGH (ref 3.3–19.4)
Kappa, lambda light chain ratio: 1.68 — ABNORMAL HIGH (ref 0.26–1.65)
Lambda free light chains: 46.4 mg/L — ABNORMAL HIGH (ref 5.7–26.3)

## 2023-06-21 LAB — CBC
HCT: 38.1 % (ref 36.0–46.0)
Hemoglobin: 13 g/dL (ref 12.0–15.0)
MCH: 30.2 pg (ref 26.0–34.0)
MCHC: 34.1 g/dL (ref 30.0–36.0)
MCV: 88.6 fL (ref 80.0–100.0)
Platelets: 127 10*3/uL — ABNORMAL LOW (ref 150–400)
RBC: 4.3 MIL/uL (ref 3.87–5.11)
RDW: 18.9 % — ABNORMAL HIGH (ref 11.5–15.5)
WBC: 13.1 10*3/uL — ABNORMAL HIGH (ref 4.0–10.5)
nRBC: 0 % (ref 0.0–0.2)

## 2023-06-21 LAB — BASIC METABOLIC PANEL WITH GFR
Anion gap: 11 (ref 5–15)
BUN: 51 mg/dL — ABNORMAL HIGH (ref 8–23)
CO2: 13 mmol/L — ABNORMAL LOW (ref 22–32)
Calcium: 6.7 mg/dL — ABNORMAL LOW (ref 8.9–10.3)
Chloride: 116 mmol/L — ABNORMAL HIGH (ref 98–111)
Creatinine, Ser: 4.03 mg/dL — ABNORMAL HIGH (ref 0.44–1.00)
GFR, Estimated: 11 mL/min — ABNORMAL LOW (ref >60)
Glucose, Bld: 97 mg/dL (ref 70–99)
Potassium: 3.8 mmol/L (ref 3.5–5.1)
Sodium: 140 mmol/L (ref 135–145)

## 2023-06-21 LAB — PROTEIN ELECTROPHORESIS, SERUM
A/G Ratio: 0.8 (ref 0.7–1.7)
Albumin ELP: 2.2 g/dL — ABNORMAL LOW (ref 2.9–4.4)
Alpha-1-Globulin: 0.2 g/dL (ref 0.0–0.4)
Alpha-2-Globulin: 0.4 g/dL (ref 0.4–1.0)
Beta Globulin: 0.7 g/dL (ref 0.7–1.3)
Gamma Globulin: 1.5 g/dL (ref 0.4–1.8)
Globulin, Total: 2.8 g/dL (ref 2.2–3.9)
Total Protein ELP: 5 g/dL — ABNORMAL LOW (ref 6.0–8.5)

## 2023-06-21 LAB — C3 COMPLEMENT: C3 Complement: 69 mg/dL — ABNORMAL LOW (ref 82–167)

## 2023-06-21 LAB — ENA+DNA/DS+ANTICH+CENTRO+JO...
Anti JO-1: <0.2 AI (ref 0.0–0.9)
Centromere Ab Screen: <0.2 AI (ref 0.0–0.9)
Chromatin Ab SerPl-aCnc: 0.6 AI (ref 0.0–0.9)
ENA SM Ab Ser-aCnc: <0.2 AI (ref 0.0–0.9)
Ribonucleic Protein: <0.2 AI (ref 0.0–0.9)
SSA (Ro) (ENA) Antibody, IgG: 0.4 AI (ref 0.0–0.9)
SSB (La) (ENA) Antibody, IgG: <0.2 AI (ref 0.0–0.9)
Scleroderma (Scl-70) (ENA) Antibody, IgG: 1.4 AI — ABNORMAL HIGH (ref 0.0–0.9)
ds DNA Ab: 1 [IU]/mL (ref 0–9)

## 2023-06-21 LAB — VITAMIN B12: Vitamin B-12: 2834 pg/mL — ABNORMAL HIGH (ref 180–914)

## 2023-06-21 LAB — C4 COMPLEMENT: Complement C4, Body Fluid: 6 mg/dL — ABNORMAL LOW (ref 12–38)

## 2023-06-21 LAB — ANA W/REFLEX IF POSITIVE: Anti Nuclear Antibody (ANA): POSITIVE — AB

## 2023-06-21 MED ORDER — SODIUM BICARBONATE 650 MG PO TABS
1300.0000 mg | ORAL_TABLET | Freq: Two times a day (BID) | ORAL | Status: DC
  Administered 2023-06-21 – 2023-06-22 (×3): 1300 mg via ORAL
  Filled 2023-06-21 (×3): qty 2

## 2023-06-21 NOTE — Progress Notes (Signed)
 Mobility Specialist - Progress Note   06/21/23 1112  Mobility  Activity Ambulated with assistance in room  Level of Assistance Standby assist, set-up cues, supervision of patient - no hands on  Assistive Device Other (Comment) (IV Pole)  Distance Ambulated (ft) 20 ft  Activity Response Tolerated well  Mobility Referral Yes  Mobility visit 1 Mobility  Mobility Specialist Stop Time (ACUTE ONLY) 1111   Pt found in hallway ambulating with husband. Pt became SOB requiring assistance back to bed. No complaints during session. Pt to bed after session with all needs met.    [facility]

## 2023-06-21 NOTE — Plan of Care (Addendum)
 Pt alert and oriented x4, c/o Sob during shift while OOB ambulating in room hallway, primary provider notified. IVF discontinued, per pt sob improved, pt rested during shift with low appetite and low oral intake. Unable to adequately obtain output during shift as patient was independently using BR. Education provided about measuring output. Plan of care discussed with patient. Pt verbalized understanding. Safety precautions maintained.   Problem: Education: Goal: Knowledge of General Education information will improve Description: Including pain rating scale, medication(s)/side effects and non-pharmacologic comfort measures Outcome: Progressing   Problem: Health Behavior/Discharge Planning: Goal: Ability to manage health-related needs will improve Outcome: Progressing   Problem: Clinical Measurements: Goal: Ability to maintain clinical measurements within normal limits will improve Outcome: Progressing Goal: Will remain free from infection Outcome: Progressing Goal: Diagnostic test results will improve Outcome: Progressing Goal: Respiratory complications will improve Outcome: Progressing Goal: Cardiovascular complication will be avoided Outcome: Progressing   Problem: Activity: Goal: Risk for activity intolerance will decrease Outcome: Progressing   Problem: Nutrition: Goal: Adequate nutrition will be maintained Outcome: Progressing   Problem: Coping: Goal: Level of anxiety will decrease Outcome: Progressing   Problem: Elimination: Goal: Will not experience complications related to bowel motility Outcome: Progressing Goal: Will not experience complications related to urinary retention Outcome: Progressing   Problem: Pain Managment: Goal: General experience of comfort will improve and/or be controlled Outcome: Progressing   Problem: Safety: Goal: Ability to remain free from injury will improve Outcome: Progressing   Problem: Skin Integrity: Goal: Risk for impaired  skin integrity will decrease Outcome: Progressing   Problem: Urinary Elimination: Goal: Signs and symptoms of infection will decrease Outcome: Progressing

## 2023-06-21 NOTE — Progress Notes (Signed)
 [facility] Progress Note   Assessment/ Plan:   # AKI - Ischemic and prerenal insults - hypotension at home, ARB use, and setting of UTI.  Note concurrent microscopic hematuria and subnephrotic range proteinuria.  Baseline Cr < 1 - Remain off of home ARB at this time - I have ordered strict ins and outs - Daily weights - Check ANA, ANCA, and complement  - Check SPEP, Free light chains - needs a biopsy- have ordered   # Hypertension - As above, remain off of ARB for now in the setting of AKI - Noted report of hypotension prior to admission - Acceptable on current regimen    # Proteinuria  - up/cr ratio 2530 mg/g  - GN w/u as above - off of ARB for AKI      # Microscopic hematuria  - Note UTI which may explain - abx per primary team  - GN w/u as above     # UTI - E coli UTI - she is not on abx  - Will start ceftriaxone daily - will need 1 week of therapy for complicated UTI with hematuria    # Pre-DM - Note A1c 6.2 on 06/02/23 - Optimizing pre-DM can reduce risk of CKD progression   # Angiomyolipoma - Probable angiomyolipoma on renal US  - Radiology recommended a follow-up renal ultrasound in 6 months   # Hypokalemia - repleted   Subjective:    ANA +, complements low, and SCL-70 is +.  Needs a biopsy.  Discussed.     Objective:   BP 118/68 (BP Location: Left Arm)   Pulse 76   Temp (!) 97.3 F (36.3 C) (Oral)   Resp 18   Ht 5' 3 (1.6 m)   Wt 76.9 kg   SpO2 99%   BMI 30.03 kg/m   Intake/Output Summary (Last 24 hours) at 06/21/2023 1531 Last data filed at 06/21/2023 0915 Gross per 24 hour  Intake 568.56 ml  Output --  Net 568.56 ml   Weight change: 0.696 kg  Physical Exam: Gen:NAD CVS: RRR Resp: clear ZOX:WRUE Ext:no LE edema  Imaging: No results found.  Labs: BMET Recent Labs  Lab 06/17/23 1238 06/18/23 0541 06/19/23 0536 06/20/23 0503 06/21/23 0548  NA 137 136 137 139 140  K 4.4 3.5 3.1* 3.5 3.8  CL 105 112* 115* 117*  116*  CO2 17* 18* 16* 15* 13*  GLUCOSE 90 140* 120* 137* 97  BUN 30* 37* 42* 45* 51*  CREATININE 2.79* 2.71* 2.96* 3.30* 4.03*  CALCIUM  8.5* 7.2* 7.0* 6.9* 6.7*   CBC Recent Labs  Lab 06/17/23 1238 06/18/23 0541 06/20/23 0503 06/21/23 0548  WBC 9.9 7.7 10.1 13.1*  HGB 14.4 13.0 12.5 13.0  HCT 42.5 38.9 37.6 38.1  MCV 87.6 90.9 90.6 88.6  PLT 165 153 132* 127*    Medications:     docusate sodium  100 mg Oral BID   dorzolamide-timolol  1 drop Both Eyes BID   ezetimibe   10 mg Oral Daily   latanoprost  1 drop Both Eyes QHS   sodium bicarbonate  1,300 mg Oral BID    Leandra Pro MD 06/21/2023, 3:31 PM

## 2023-06-21 NOTE — Plan of Care (Signed)

## 2023-06-21 NOTE — Progress Notes (Addendum)
 PROGRESS NOTE    Marissa Orozco  ZOX:[id number] DOB: 20-Jul-1945 DOA: 06/17/2023 PCP: Cleave Curling, MD    Brief Narrative:  Marissa Orozco is a 78 y.o. female with medical history significant of  for hx of CVA, HTN, HLD, hx of kidney stones who presented to ED with complaints of low blood pressure and fatigue. She started to feel tired on Tuesday and stayed in bed all day. No other symptoms. She did check her blood pressure and it was around 110/70. She still was fatigued over the following 2 days. She has had poor PO intake over past several days as well.  Her blood pressure today when she took it was 86/61. She called her PCP and they told her to go hospital.  Creatinine did not respond to IV fluids and blood pressure support so renal was consulted workup underway.  Patient may eventually require renal biopsy.     Assessment and Plan:  AKI (acute kidney injury) (HCC) -baseline creatinine normal, presenting with creatinine of 2.79--which has continued to trend up since admission - Repeat UA with continued elevated amounts of protein and creatinine is not improved-there is also question of a UTI so antibiotics started and plan for 7 days - Strict I's and O's have been ordered but not kept have instructed nursing and patient again to keep strict I's and O's -hx of kidney stones years ago. She has no CVA tenderness/pain.  -Will plan to stop IV fluids as I am afraid she is getting volume overloaded - Renal consult appreciated: Multiple labs pending  HTN (hypertension), malignant -Blood pressure has been normotensive while in hospital setting  -hold valsartan  with AKI - Avoid hypotension  Hyperlipidemia with target low density lipoprotein (LDL) cholesterol less than 70 mg/dL Continue high intensity statin daily  Continue zetia  daily   History of stroke Continue high intensity statin - Platelets are trending down so we will hold aspirin  for now  Hypokalemia -  Replete        DVT prophylaxis: Place and maintain sequential compression device Start: 06/20/23 1531    Code Status: Full Code  Disposition Plan:  Level of care: Med-Surg      Consultants:  Renal  Subjective: Numbness in fingertips has resolved  Objective: Vitals:   06/20/23 1634 06/20/23 1932 06/21/23 0443 06/21/23 0500  BP: 133/72 131/76 (!) 121/55   Pulse: 71 74 78   Resp:  20 20   Temp:  98.3 F (36.8 C) 98.7 F (37.1 C)   TempSrc:      SpO2:  99% 97%   Weight:    76.9 kg  Height:        Intake/Output Summary (Last 24 hours) at 06/21/2023 1035 Last data filed at 06/21/2023 0915 Gross per 24 hour  Intake 1048.56 ml  Output 200 ml  Net 848.56 ml   Filed Weights   06/17/23 1756 06/20/23 0538 06/21/23 0500  Weight: 76.2 kg 76.2 kg 76.9 kg    Examination:   General: Appearance:     Overweight female in no acute distress     Lungs:     respirations unlabored  Heart:    Normal heart rate.    MS:   All extremities are intact.    Neurologic:   Awake, alert       Data Reviewed: I have personally reviewed following labs and imaging studies  CBC: Recent Labs  Lab 06/17/23 1238 06/18/23 0541 06/20/23 0503 06/21/23 0548  WBC 9.9 7.7 10.1 13.1*  HGB 14.4 13.0 12.5 13.0  HCT 42.5 38.9 37.6 38.1  MCV 87.6 90.9 90.6 88.6  PLT 165 153 132* 127*   Basic Metabolic Panel: Recent Labs  Lab 06/17/23 1238 06/18/23 0541 06/19/23 0536 06/20/23 0503 06/21/23 0548  NA 137 136 137 139 140  K 4.4 3.5 3.1* 3.5 3.8  CL 105 112* 115* 117* 116*  CO2 17* 18* 16* 15* 13*  GLUCOSE 90 140* 120* 137* 97  BUN 30* 37* 42* 45* 51*  CREATININE 2.79* 2.71* 2.96* 3.30* 4.03*  CALCIUM  8.5* 7.2* 7.0* 6.9* 6.7*   GFR: Estimated Creatinine Clearance: 11.1 mL/min (A) (by C-G formula based on SCr of 4.03 mg/dL (H)). Liver Function Tests: No results for input(s): AST, ALT, ALKPHOS, BILITOT, PROT, ALBUMIN in the last 168 hours. No results for input(s):  LIPASE, AMYLASE in the last 168 hours. No results for input(s): AMMONIA in the last 168 hours. Coagulation Profile: No results for input(s): INR, PROTIME in the last 168 hours. Cardiac Enzymes: Recent Labs  Lab 06/19/23 2019  CKTOTAL 516*   BNP (last 3 results) No results for input(s): PROBNP in the last 8760 hours. HbA1C: No results for input(s): HGBA1C in the last 72 hours. CBG: No results for input(s): GLUCAP in the last 168 hours. Lipid Profile: No results for input(s): CHOL, HDL, LDLCALC, TRIG, CHOLHDL, LDLDIRECT in the last 72 hours. Thyroid  Function Tests: No results for input(s): TSH, T4TOTAL, FREET4, T3FREE, THYROIDAB in the last 72 hours.  Anemia Panel: Recent Labs    06/21/23 0548  VITAMINB12 2,834*   Sepsis Labs: No results for input(s): PROCALCITON, LATICACIDVEN in the last 168 hours.  Recent Results (from the past 240 hours)  Urine Culture (for pregnant, neutropenic or urologic patients or patients with an indwelling urinary catheter)     Status: Abnormal   Collection Time: 06/17/23 10:24 PM   Specimen: Urine, Clean Catch  Result Value Ref Range Status   Specimen Description   Final    URINE, CLEAN CATCH Performed at [facility], [address], [place], [place] [address]    Special Requests   Final    NONE Performed at [facility], [address], [place], [place] [address]    Culture >=100,000 COLONIES/mL ESCHERICHIA COLI (A)  Final   Report Status 06/20/2023 FINAL  Final   Organism ID, Bacteria ESCHERICHIA COLI (A)  Final      Susceptibility   Escherichia coli - MIC*    AMPICILLIN <=2 SENSITIVE Sensitive     CEFAZOLIN <=4 SENSITIVE Sensitive     CEFEPIME <=0.12 SENSITIVE Sensitive     CEFTRIAXONE <=0.25 SENSITIVE Sensitive     CIPROFLOXACIN <=0.25 SENSITIVE Sensitive     GENTAMICIN <=1 SENSITIVE Sensitive     IMIPENEM <=0.25 SENSITIVE Sensitive     NITROFURANTOIN   <=16 SENSITIVE Sensitive     TRIMETH/SULFA <=20 SENSITIVE Sensitive     AMPICILLIN/SULBACTAM <=2 SENSITIVE Sensitive     PIP/TAZO <=4 SENSITIVE Sensitive ug/mL    * >=100,000 COLONIES/mL ESCHERICHIA COLI         Radiology Studies: No results found.       Scheduled Meds:  docusate sodium  100 mg Oral BID   dorzolamide-timolol  1 drop Both Eyes BID   ezetimibe   10 mg Oral Daily   latanoprost  1 drop Both Eyes QHS   Continuous Infusions:  sodium chloride  75 mL/hr at 06/20/23 1018   cefTRIAXone (ROCEPHIN)  IV Stopped (06/20/23 2156)   sodium chloride  Stopped (  06/20/23 1633)     LOS: 3 days    Time spent: 45 minutes spent on chart review, discussion with nursing staff, consultants, updating family and interview/physical exam; more than 50% of that time was spent in counseling and/or coordination of care.    Enrigue Harvard, DO [facility] Available via Epic secure chat 7am-7pm After these hours, please refer to coverage provider listed on [email or web address] 06/21/2023, 10:35 AM

## 2023-06-22 DIAGNOSIS — E785 Hyperlipidemia, unspecified: Secondary | ICD-10-CM

## 2023-06-22 DIAGNOSIS — N179 Acute kidney failure, unspecified: Secondary | ICD-10-CM | POA: Diagnosis not present

## 2023-06-22 DIAGNOSIS — I10 Essential (primary) hypertension: Secondary | ICD-10-CM

## 2023-06-22 DIAGNOSIS — E860 Dehydration: Secondary | ICD-10-CM

## 2023-06-22 LAB — CBC
HCT: 40.5 % (ref 36.0–46.0)
Hemoglobin: 13.3 g/dL (ref 12.0–15.0)
MCH: 30.2 pg (ref 26.0–34.0)
MCHC: 32.8 g/dL (ref 30.0–36.0)
MCV: 92 fL (ref 80.0–100.0)
Platelets: 123 10*3/uL — ABNORMAL LOW (ref 150–400)
RBC: 4.4 MIL/uL (ref 3.87–5.11)
RDW: 19.3 % — ABNORMAL HIGH (ref 11.5–15.5)
WBC: 10.9 10*3/uL — ABNORMAL HIGH (ref 4.0–10.5)
nRBC: 0 % (ref 0.0–0.2)

## 2023-06-22 LAB — BASIC METABOLIC PANEL WITH GFR
Anion gap: 12 (ref 5–15)
BUN: 61 mg/dL — ABNORMAL HIGH (ref 8–23)
CO2: 10 mmol/L — ABNORMAL LOW (ref 22–32)
Calcium: 7 mg/dL — ABNORMAL LOW (ref 8.9–10.3)
Chloride: 116 mmol/L — ABNORMAL HIGH (ref 98–111)
Creatinine, Ser: 5.2 mg/dL — ABNORMAL HIGH (ref 0.44–1.00)
GFR, Estimated: 8 mL/min — ABNORMAL LOW (ref >60)
Glucose, Bld: 113 mg/dL — ABNORMAL HIGH (ref 70–99)
Potassium: 3.9 mmol/L (ref 3.5–5.1)
Sodium: 138 mmol/L (ref 135–145)

## 2023-06-22 LAB — PROTIME-INR
INR: 1.4 — ABNORMAL HIGH (ref 0.8–1.2)
Prothrombin Time: 17.3 s — ABNORMAL HIGH (ref 11.4–15.2)

## 2023-06-22 MED ORDER — SODIUM BICARBONATE 650 MG PO TABS
1300.0000 mg | ORAL_TABLET | Freq: Three times a day (TID) | ORAL | Status: DC
  Administered 2023-06-22 – 2023-06-26 (×12): 1300 mg via ORAL
  Filled 2023-06-22 (×12): qty 2

## 2023-06-22 MED ORDER — SODIUM CHLORIDE 0.9 % IV SOLN: INTRAVENOUS | Status: DC

## 2023-06-22 NOTE — Plan of Care (Signed)

## 2023-06-22 NOTE — Progress Notes (Signed)
 Mobility Specialist - Progress Note   06/22/23 0847  Mobility  Activity Ambulated with assistance in room;Ambulated with assistance to bathroom  Level of Assistance Modified independent, requires aide device or extra time  Assistive Device Other (Comment) (IV Pole)  Distance Ambulated (ft) 20 ft  Activity Response Tolerated well  Mobility Referral Yes  Mobility visit 1 Mobility  Mobility Specialist Start Time (ACUTE ONLY) 0845  Mobility Specialist Stop Time (ACUTE ONLY) 0847  Mobility Specialist Time Calculation (min) (ACUTE ONLY) 2 min   Pt received in room requesting assistance to the shower. Pt SOB with session. Pt to [facility] for shower with all needs met. Instructed pt to pull call bell if needed.    [facility]

## 2023-06-22 NOTE — Consult Note (Signed)
 Chief Complaint: Patient was seen in consultation today for acute kidney injury with hypotension, in the setting of E.coli UTI, with consideration for random renal biopsy.  Referring Provider(s): Dr. Leandra Pro, MD   Supervising Physician: Marland Silvas  Patient Status: [facility] - In-pt  Patient is Full Code  History of Present Illness: Marissa Orozco is a 78 y.o. female  with PMHx notable for HTN, HLD, heart murmur, OSA, nephrolithiasis, interstitial cystitis, cataracts, glaucoma, uterine fibroid, GERD, and osteoporosis.  Per Dr. Myrtle Atta progress note on 6/17: AKI - Ischemic and prerenal insults - hypotension at home, ARB use, and setting of UTI.  Note concurrent microscopic hematuria and subnephrotic range proteinuria.  Baseline Cr < 1 - Remain off of home ARB at this time - I have ordered strict ins and outs - Daily weights - Check ANA, ANCA, and complement  - Check SPEP, Free light chains - needs a biopsy- have ordered  Interventional Radiology was requested for random renal biopsy. Patient is scheduled for same in IR today.   Patient is alert and laying in bed, calm.  Patient is feeling increasingly tired, worse since yesterday. Patient denies any fevers, headache, chest pain, SOB, cough, abdominal pain, nausea, vomiting or bleeding.     Past Medical History:  Diagnosis Date   Allergy 2000   Breast lump    Cataract    Colon polyps    Fibroids    GERD (gastroesophageal reflux disease)    Glaucoma    Heart murmur    Hyperlipidemia 01/05/1984   Hypertension 01/05/1984   Interstitial cystitis 01/04/1997   Kidney stones 01/05/2003   Neuromuscular disorder (HCC)    Osteoporosis    Preop cardiovascular exam 10/25/2022   Sleep apnea 2021   Tonsillitis     Past Surgical History:  Procedure Laterality Date   ABDOMINAL HYSTERECTOMY  1997   BREAST EXCISIONAL BIOPSY Right 07/27/2006   Intraductal Papilloma   BREAST SURGERY  2008   CATARACT EXTRACTION  Bilateral 02/2020   other in 05/2020   COLONOSCOPY  2010   Dr Tova Fresh   EYE SURGERY  2022   HYSTEROSCOPY  1993   LAPAROSCOPIC HYSTERECTOMY  1994   MYOMECTOMY  1993   excision of tumor   POLYPECTOMY     TONSILLECTOMY  1953   TUBAL LIGATION  1980    Allergies: Hydrochlorothiazide  Medications: Prior to Admission medications   Medication Sig Start Date End Date Taking? Authorizing Provider  amLODipine  (NORVASC ) 5 MG tablet TAKE 1 TABLET BY MOUTH DAILY Patient taking differently: Take 5 mg by mouth at bedtime. 11/22/22  Yes Cleave Curling, MD  aspirin  EC 81 MG tablet Take 81 mg by mouth daily. Swallow whole.   Yes [provider]  atorvastatin  (LIPITOR) 80 MG tablet TAKE 1 TABLET BY MOUTH DAILY 09/08/22  Yes Cleave Curling, MD  Calcium  Carbonate (CALCIUM  500 PO) Take 500 mg by mouth daily with breakfast.   Yes [provider]  Cholecalciferol (VITAMIN D3) 50 MCG (2000 UT) TABS Take 2,000 Units by mouth daily with breakfast.   Yes [provider]  dorzolamide-timolol (COSOPT) 22.3-6.8 MG/ML ophthalmic solution Place 1 drop into both eyes in the morning and at bedtime. 02/02/20  Yes [provider]  ezetimibe  (ZETIA ) 10 MG tablet Take 1 tablet (10 mg total) by mouth daily. 02/16/23  Yes Cleave Curling, MD  finasteride (PROSCAR) 5 MG tablet Take 5 mg by mouth daily.   Yes [provider]  latanoprost Maile Score)  0.005 % ophthalmic solution Place 1 drop into both eyes at bedtime. 03/17/13  Yes [provider]  valsartan  (DIOVAN ) 160 MG tablet Take 1 tablet (160 mg total) by mouth daily. 02/16/23 02/16/24 Yes Cleave Curling, MD     Family History  Problem Relation Age of Onset   Hypertension Mother    Heart disease Mother    Colon cancer Mother 44   Arthritis Mother    Cancer Mother    Coronary artery disease Father        mother, PGM   Stroke Father    Hypertension Father    Colon cancer Father 34   Cancer Father    Colon cancer Maternal  Grandmother    Diabetes Other    Breast cancer Neg Hx    Colon polyps Neg Hx    Esophageal cancer Neg Hx    Stomach cancer Neg Hx    Rectal cancer Neg Hx    BRCA 1/2 Neg Hx     Social History   Socioeconomic History   Marital status: Married    Spouse name: Not on file   Number of children: 2   Years of education: Not on file   Highest education level: Doctorate  Occupational History   Occupation: retired   Occupation: retired  Tobacco Use   Smoking status: Never   Smokeless tobacco: Never  Vaping Use   Vaping status: Never Used  Substance and Sexual Activity   Alcohol use: Yes    Alcohol/week: 5.0 standard drinks of alcohol    Types: 5 Glasses of wine per week   Drug use: No   Sexual activity: Not Currently  Other Topics Concern   Not on file  Social History Narrative   Not on file   Social Drivers of Health   Financial Resource Strain: Low Risk  (02/12/2023)   Overall Financial Resource Strain (CARDIA)    Difficulty of Paying Living Expenses: Not hard at all  Food Insecurity: No Food Insecurity (06/17/2023)   Hunger Vital Sign    Worried About Running Out of Food in the Last Year: Never true    Ran Out of Food in the Last Year: Never true  Transportation Needs: No Transportation Needs (06/17/2023)   PRAPARE - Administrator, Civil Service (Medical): No    Lack of Transportation (Non-Medical): No  Physical Activity: Sufficiently Active (02/12/2023)   Exercise Vital Sign    Days of Exercise per Week: 5 days    Minutes of Exercise per Session: 40 min  Stress: No Stress Concern Present (02/12/2023)   [employer] of Occupational Health - Occupational Stress Questionnaire    Feeling of Stress : Not at all  Social Connections: Moderately Isolated (06/17/2023)   Social Connection and Isolation Panel    Frequency of Communication with Friends and Family: More than three times a week    Frequency of Social Gatherings with Friends and Family: Once a week     Attends Religious Services: Never    Database administrator or Organizations: No    Attends Engineer, structural: Never    Marital Status: Married     Review of Systems: A 12 point ROS discussed and pertinent positives are indicated in the HPI above.  All other systems are negative.  Vital Signs: BP 135/73 (BP Location: Right Arm)   Pulse 71   Temp 97.8 F (36.6 C)   Resp 18   Ht 5' 3 (1.6 m)   Wt  169 lb 8.5 oz (76.9 kg)   SpO2 99%   BMI 30.03 kg/m   Advance Care Plan: The advanced care place/surrogate decision maker was discussed at the time of visit and the patient did not wish to discuss or was not able to name a surrogate decision maker or provide an advance care plan.  Physical Exam Constitutional:      General: She is not in acute distress.    Appearance: Normal appearance.     Comments: Patient appears worn down, tired.  HENT:     Mouth/Throat:     Mouth: Mucous membranes are moist.   Cardiovascular:     Rate and Rhythm: Normal rate and regular rhythm.     Pulses: Normal pulses.     Heart sounds: Murmur heard.  Pulmonary:     Effort: Pulmonary effort is normal.     Breath sounds: Normal breath sounds.  Abdominal:     General: Abdomen is flat.   Musculoskeletal:        General: Normal range of motion.     Cervical back: Normal range of motion.   Skin:    General: Skin is warm and dry.   Neurological:     Mental Status: She is alert and oriented to person, place, and time.   Psychiatric:        Mood and Affect: Mood normal.        Behavior: Behavior normal.        Thought Content: Thought content normal.        Judgment: Judgment normal.     Imaging: US  RENAL Result Date: 06/17/2023 CLINICAL DATA:  Acute kidney injury EXAM: RENAL / URINARY TRACT ULTRASOUND COMPLETE COMPARISON:  None Available. FINDINGS: Right Kidney: Renal measurements: 10.2 x 5.3 x 5.1 cm = volume: 142.5 mL. Cortex appears slightly echogenic. No hydronephrosis. Echogenic  solid mass upper pole right kidney measuring 10 x 9 x 10 mm. Left Kidney: Renal measurements: 9.5 x 5.5 x 4.6 cm = volume: 124.7 mL. Echogenicity within normal limits. No mass or hydronephrosis Bladder: Appears normal for degree of bladder distention. Other: None. IMPRESSION: 1. No hydronephrosis. Slightly echogenic right renal cortex as may be seen with medical renal disease. 2. 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 Recommend follow-up ultrasound in 6 months. Electronically Signed   By: Esmeralda Hedge M.D.   On: 06/17/2023 23:03   DG Chest Port 1 View Result Date: 06/17/2023 CLINICAL DATA:  Fatigue and malaise for 3 days. EXAM: PORTABLE CHEST 1 VIEW COMPARISON:  09/07/2011. FINDINGS: Low lung volume. Bilateral lung fields are clear. Bilateral costophrenic angles are clear. Mildly enlarged cardio-mediastinal silhouette, likely accentuated by low lung volume and AP technique. No acute osseous abnormalities. The soft tissues are within normal limits. IMPRESSION: No active disease. Electronically Signed   By: Beula Brunswick M.D.   On: 06/17/2023 12:38   MM 3D SCREENING MAMMOGRAM BILATERAL BREAST Result Date: 05/27/2023 CLINICAL DATA:  Screening. EXAM: DIGITAL SCREENING BILATERAL MAMMOGRAM WITH TOMOSYNTHESIS AND CAD TECHNIQUE: Bilateral screening digital craniocaudal and mediolateral oblique mammograms were obtained. Bilateral screening digital breast tomosynthesis was performed. The images were evaluated with computer-aided detection. COMPARISON:  Previous exam(s). ACR Breast Density Category b: There are scattered areas of fibroglandular density. FINDINGS: There are no findings suspicious for malignancy. IMPRESSION: No mammographic evidence of malignancy. A result letter of this screening mammogram will be mailed directly to the patient. RECOMMENDATION: Screening mammogram in one year. (Code:SM-B-01Y) BI-RADS CATEGORY  1: Negative.  Electronically Signed   By: Anna Barnes  M.D.   On: 05/27/2023 16:06    Labs:  CBC: Recent Labs    06/18/23 0541 06/20/23 0503 06/21/23 0548 06/22/23 0518  WBC 7.7 10.1 13.1* 10.9*  HGB 13.0 12.5 13.0 13.3  HCT 38.9 37.6 38.1 40.5  PLT 153 132* 127* 123*    COAGS: Recent Labs    06/22/23 0518  INR 1.4*    BMP: Recent Labs    06/19/23 0536 06/20/23 0503 06/21/23 0548 06/22/23 0518  NA 137 139 140 138  K 3.1* 3.5 3.8 3.9  CL 115* 117* 116* 116*  CO2 16* 15* 13* 10*  GLUCOSE 120* 137* 97 113*  BUN 42* 45* 51* 61*  CALCIUM  7.0* 6.9* 6.7* 7.0*  CREATININE 2.96* 3.30* 4.03* 5.20*  GFRNONAA 16* 14* 11* 8*    LIVER FUNCTION TESTS: Recent Labs    09/15/22 0925 02/16/23 1059  BILITOT 1.3* 1.2  AST 22 30  ALT 21 30  ALKPHOS 91 104  PROT 6.2 6.4  ALBUMIN 4.1 4.0    TUMOR MARKERS: No results for input(s): AFPTM, CEA, CA199, CHROMGRNA in the last 8760 hours.  Assessment and Plan: Per Dr. Myrtle Atta progress note on 6/17: AKI - Ischemic and prerenal insults - hypotension at home, ARB use, and setting of UTI.  Note concurrent microscopic hematuria and subnephrotic range proteinuria.  Baseline Cr < 1 - Remain off of home ARB at this time - I have ordered strict ins and outs - Daily weights - Check ANA, ANCA, and complement  - Check SPEP, Free light chains - needs a biopsy- have ordered   Last dose of aspirin  taken 6/16, requiring no less than a 5 day washout. IR service does not perform biopsies over weekends. Patient is tentatively scheduled in IR on 6/23. She will be NPO at midnight on 6/23.    All labs and medications are currently within acceptable parameters. Creatinine is up-trending, currently 5.20, BUN 61, eGFR 86, CK 516, WBC 10.9, platelets 123. No pertinent allergies.   Risks and benefits of random renal biopsy was discussed with the patient and/or patient's family including, but not limited to bleeding, infection, damage to adjacent structures or low yield requiring additional  tests.  All of the questions were answered and there is agreement to proceed.  Consent signed and in chart.      Thank you for allowing our service to participate in Marissa Orozco 's care.  Electronically Signed: Lovena Rubinstein, PA-C   06/22/2023, 11:53 AM      I spent a total of 40 Minutes in face to face in clinical consultation, greater than 50% of which was counseling/coordinating care for acute kidney injury with hypotension, in the setting of E.coli UTI, with consideration for random renal biopsy.

## 2023-06-22 NOTE — Progress Notes (Addendum)
 [facility]                                                                              Marissa Orozco, is a 78 y.o. female, DOB - [birth year], AOZ:[id number] Admit date - 06/17/2023    Outpatient Primary MD for the patient is Cleave Curling, MD  LOS - 4  days  Chief Complaint  Patient presents with   Hypotension       Brief summary   Patient is a 78 y.o. female with medical history significant of  for hx of CVA, HTN, HLD, hx of kidney stones who presented to ED with complaints of low blood pressure and fatigue. She started to feel tired on Tuesday and stayed in bed all day. No other symptoms. She did check her blood pressure and it was around 110/70. She still was fatigued over the following 2 days. She has had poor PO intake over past several days as well.  On the day of admit, BP was 86/61, she called her PCP and was recommended to seek medical attention at ED.   Creatinine did not respond to IV fluids and blood pressure support so renal was consulted workup underway.     Assessment & Plan    Principal Problem:   AKI (acute kidney injury) (HCC),  NAG metabolic acidosis -Unclear etiology -Baseline creatinine  <1, presented with creatinine of 2.79, has continued to trend up, today 5.2 - Off home ARB, nephrology consulted  - Plan for renal biopsy on 06/27/2023, needs aspirin  washout for 5 days  - Follow renal workup - Continue to hold aspirin     HTN (hypertension), malignant - Continue to hold valsartan  - BP stable   E. coli UTI - Urine culture showed more than 100,000 colonies of E. Coli - Continue IV Rocephin, treat as complicated UTI for 7 days   Hyperlipidemia  - Continue Zetia  -Lipitor held    History of stroke -Hold aspirin  for pending renal biopsy -CK elevated, hold Lipitor   Hypokalemia - Replete   Obesity class I Estimated body mass index is 30.03 kg/m as calculated from the following:   Height as of this encounter: 5' 3 (1.6 m).    Weight as of this encounter: 76.9 kg.  Code Status: Full code DVT Prophylaxis:  Place and maintain sequential compression device Start: 06/20/23 1531   Level of Care: Level of care: Med-Surg Family Communication: Updated patient Disposition Plan:      Remains inpatient appropriate:      Procedures:    Consultants:   Nephrology IR  Antimicrobials:   Anti-infectives (From admission, onward)    Start     Dose/Rate Route Frequency Ordered Stop   06/19/23 2000  cefTRIAXone (ROCEPHIN) 1 g in sodium chloride  0.9 % 100 mL IVPB        1 g 200 mL/hr over 30 Minutes Intravenous Every 24 hours 06/19/23 1928 06/26/23 1959          Medications  docusate sodium  100 mg Oral BID   dorzolamide-timolol  1 drop Both Eyes BID   ezetimibe   10 mg Oral Daily   latanoprost  1 drop  Both Eyes QHS   sodium bicarbonate  1,300 mg Oral BID      Subjective:   Marissa Orozco was seen and examined today.  Tired, feels dizzy on walking up to the bathroom no acute chest pain, shortness of breath, vomiting, diarrhea.   Objective:   Vitals:   06/21/23 2058 06/21/23 2115 06/22/23 0521 06/22/23 1147  BP: 129/61  95/69 135/73  Pulse: 76  73 71  Resp: 20  16 18   Temp: 98.5 F (36.9 C)  98.5 F (36.9 C) 97.8 F (36.6 C)  TempSrc:      SpO2: 100% 99% 100% 99%  Weight:      Height:        Intake/Output Summary (Last 24 hours) at 06/22/2023 1412 Last data filed at 06/22/2023 0657 Gross per 24 hour  Intake 660 ml  Output 225 ml  Net 435 ml     Wt Readings from Last 3 Encounters:  06/21/23 76.9 kg  06/02/23 77.5 kg  02/16/23 76.7 kg     Exam General: oriented x 3, NAD, sitting at edge of the bed Cardiovascular: S1 S2 auscultated,  RRR Respiratory: CTAB, no wheezing Gastrointestinal: Soft, nontender, nondistended, + bowel sounds Ext: no pedal edema bilaterally Neuro: no new deficits Psych: Normal affect     Data Reviewed:  I have personally reviewed following labs     CBC Lab Results  Component Value Date   WBC 10.9 (H) 06/22/2023   RBC 4.40 06/22/2023   HGB 13.3 06/22/2023   HCT 40.5 06/22/2023   MCV 92.0 06/22/2023   MCH 30.2 06/22/2023   PLT 123 (L) 06/22/2023   MCHC 32.8 06/22/2023   RDW 19.3 (H) 06/22/2023   LYMPHSABS 1.5 05/25/2018   MONOABS 0.6 11/05/2011   EOSABS 0.1 05/25/2018   BASOSABS 0.1 05/25/2018     Last metabolic panel Lab Results  Component Value Date   NA 138 06/22/2023   K 3.9 06/22/2023   CL 116 (H) 06/22/2023   CO2 10 (L) 06/22/2023   BUN 61 (H) 06/22/2023   CREATININE 5.20 (H) 06/22/2023   GLUCOSE 113 (H) 06/22/2023   GFRNONAA 8 (L) 06/22/2023   GFRAA 89 10/18/2019   CALCIUM  7.0 (L) 06/22/2023   PROT 6.4 02/16/2023   ALBUMIN 4.0 02/16/2023   LABGLOB 2.8 06/19/2023   AGRATIO 0.8 06/19/2023   BILITOT 1.2 02/16/2023   ALKPHOS 104 02/16/2023   AST 30 02/16/2023   ALT 30 02/16/2023   ANIONGAP 12 06/22/2023    CBG (last 3)  No results for input(s): GLUCAP in the last 72 hours.    Coagulation Profile: Recent Labs  Lab 06/22/23 0518  INR 1.4*     Radiology Studies: I have personally reviewed the imaging studies  No results found.     Bertram Brocks M.D. [facility] 06/22/2023, 2:12 PM  Available via Epic secure chat 7am-7pm After 7 pm, please refer to night coverage provider listed on amion.

## 2023-06-22 NOTE — Progress Notes (Signed)
 [facility] Progress Note   Assessment/ Plan:   # AKI - Ischemic and prerenal insults - hypotension at home, ARB use, and setting of UTI.  Note concurrent microscopic hematuria and subnephrotic range proteinuria.  Baseline Cr < 1 - Remain off of home ARB at this time - I have ordered strict ins and outs - Daily weights - Check ANA, ANCA, and complement  - Check SPEP, Free light chains - needs a biopsy- have ordered, for Monday - may need dialysis if we keep going this way- discussed with pt   # Hypertension - As above, remain off of ARB for now in the setting of AKI - Noted report of hypotension prior to admission - Acceptable on current regimen    # Proteinuria  - up/cr ratio 2530 mg/g  - GN w/u as above - off of ARB for AKI      # Microscopic hematuria  - Note UTI which may explain - abx per primary team  - GN w/u as above     # UTI - E coli UTI - she is not on abx  - Will start ceftriaxone daily - will need 1 week of therapy for complicated UTI with hematuria    # Pre-DM - Note A1c 6.2 on 06/02/23 - Optimizing pre-DM can reduce risk of CKD progression   # Angiomyolipoma - Probable angiomyolipoma on renal US  - Radiology recommended a follow-up renal ultrasound in 6 months   # Hypokalemia - repleted   Subjective:    Aspirin  washout- biopsy for Monday.  Greatly appreciate IR.  Cr continues to climb, sleepier.     Objective:   BP 135/73 (BP Location: Right Arm)   Pulse 71   Temp 97.8 F (36.6 C)   Resp 18   Ht 5' 3 (1.6 m)   Wt 76.9 kg   SpO2 99%   BMI 30.03 kg/m   Intake/Output Summary (Last 24 hours) at 06/22/2023 1542 Last data filed at 06/22/2023 1515 Gross per 24 hour  Intake 660 ml  Output 375 ml  Net 285 ml   Weight change:   Physical Exam: Gen:NAD CVS: RRR Resp: clear GUY:QIHK Ext:no LE edema  Imaging: No results found.  Labs: BMET Recent Labs  Lab 06/17/23 1238 06/18/23 0541 06/19/23 0536 06/20/23 0503  06/21/23 0548 06/22/23 0518  NA 137 136 137 139 140 138  K 4.4 3.5 3.1* 3.5 3.8 3.9  CL 105 112* 115* 117* 116* 116*  CO2 17* 18* 16* 15* 13* 10*  GLUCOSE 90 140* 120* 137* 97 113*  BUN 30* 37* 42* 45* 51* 61*  CREATININE 2.79* 2.71* 2.96* 3.30* 4.03* 5.20*  CALCIUM  8.5* 7.2* 7.0* 6.9* 6.7* 7.0*   CBC Recent Labs  Lab 06/18/23 0541 06/20/23 0503 06/21/23 0548 06/22/23 0518  WBC 7.7 10.1 13.1* 10.9*  HGB 13.0 12.5 13.0 13.3  HCT 38.9 37.6 38.1 40.5  MCV 90.9 90.6 88.6 92.0  PLT 153 132* 127* 123*    Medications:     docusate sodium  100 mg Oral BID   dorzolamide-timolol  1 drop Both Eyes BID   ezetimibe   10 mg Oral Daily   latanoprost  1 drop Both Eyes QHS   sodium bicarbonate  1,300 mg Oral TID    Leandra Pro MD 06/22/2023, 3:42 PM

## 2023-06-23 ENCOUNTER — Ambulatory Visit: Payer: Self-pay | Admitting: Internal Medicine

## 2023-06-23 DIAGNOSIS — I10 Essential (primary) hypertension: Secondary | ICD-10-CM | POA: Diagnosis not present

## 2023-06-23 DIAGNOSIS — E860 Dehydration: Secondary | ICD-10-CM | POA: Diagnosis not present

## 2023-06-23 DIAGNOSIS — E785 Hyperlipidemia, unspecified: Secondary | ICD-10-CM | POA: Diagnosis not present

## 2023-06-23 DIAGNOSIS — N179 Acute kidney failure, unspecified: Secondary | ICD-10-CM | POA: Diagnosis not present

## 2023-06-23 LAB — RENAL FUNCTION PANEL
Albumin: 2.1 g/dL — ABNORMAL LOW (ref 3.5–5.0)
Anion gap: 13 (ref 5–15)
BUN: 72 mg/dL — ABNORMAL HIGH (ref 8–23)
CO2: 13 mmol/L — ABNORMAL LOW (ref 22–32)
Calcium: 8 mg/dL — ABNORMAL LOW (ref 8.9–10.3)
Chloride: 117 mmol/L — ABNORMAL HIGH (ref 98–111)
Creatinine, Ser: 5.69 mg/dL — ABNORMAL HIGH (ref 0.44–1.00)
GFR, Estimated: 7 mL/min — ABNORMAL LOW (ref >60)
Glucose, Bld: 130 mg/dL — ABNORMAL HIGH (ref 70–99)
Phosphorus: 6.2 mg/dL — ABNORMAL HIGH (ref 2.5–4.6)
Potassium: 3.5 mmol/L (ref 3.5–5.1)
Sodium: 143 mmol/L (ref 135–145)

## 2023-06-23 LAB — CBC
HCT: 38.7 % (ref 36.0–46.0)
Hemoglobin: 13.2 g/dL (ref 12.0–15.0)
MCH: 30.3 pg (ref 26.0–34.0)
MCHC: 34.1 g/dL (ref 30.0–36.0)
MCV: 88.8 fL (ref 80.0–100.0)
Platelets: 116 10*3/uL — ABNORMAL LOW (ref 150–400)
RBC: 4.36 MIL/uL (ref 3.87–5.11)
RDW: 18.6 % — ABNORMAL HIGH (ref 11.5–15.5)
WBC: 10 10*3/uL (ref 4.0–10.5)
nRBC: 0 % (ref 0.0–0.2)

## 2023-06-23 LAB — HEPATITIS B SURFACE ANTIGEN: Hepatitis B Surface Ag: NONREACTIVE

## 2023-06-23 LAB — ANCA TITERS
Atypical P-ANCA titer: <1:20 {titer}
C-ANCA: <1:20 {titer}
P-ANCA: 1:320 {titer} — ABNORMAL HIGH

## 2023-06-23 MED ORDER — CHLORHEXIDINE GLUCONATE CLOTH 2 % EX PADS
6.0000 | MEDICATED_PAD | Freq: Every day | CUTANEOUS | Status: DC
  Administered 2023-06-25 – 2023-07-04 (×6): 6 via TOPICAL

## 2023-06-23 NOTE — Progress Notes (Signed)
 [facility] Progress Note   Assessment/ Plan:   # AKI - Ischemic and prerenal insults - hypotension at home, ARB use, and setting of UTI.  Note concurrent microscopic hematuria and subnephrotic range proteinuria.  Baseline Cr < 1 - Remain off of home ARB at this time - Daily weights and strict I&O - ANA + and complement both low - DSDNA, dsDNA, antiSM neg; ANCA P, - SPEP no M-paraprotein, K/L high but from renal failure - needs a biopsy- have ordered, appreciate VIR seeing and biopsy scheduled for Monday without for aspirin  washout - Feeling worse with renal function going the wrong direction as well, worsening acidosis, nausea, decreasing urine output ` -> Will transfer to [facility] for a tunneled catheter and to initiate dialysis on Friday   # Hypertension - As above, remain off of ARB for now in the setting of AKI - Noted report of hypotension prior to admission - Acceptable on current regimen    # Proteinuria  - up/cr ratio 2530 mg/g  - GN w/u as above - off of ARB for AKI      # Microscopic hematuria  - Note UTI which may explain - abx per primary team  - GN w/u as above     # UTI - E coli UTI - Ceftriaxone daily started on 6/15 - will need 1 week of therapy for complicated UTI with hematuria    # Pre-DM - Note A1c 6.2 on 06/02/23 - Optimizing pre-DM can reduce risk of CKD progression   # Angiomyolipoma - Probable angiomyolipoma on renal US  - Radiology recommended a follow-up renal ultrasound in 6 months   # Hypokalemia - repleted   Subjective:    Aspirin  washout- biopsy for Monday.  Greatly appreciate IR.  Cr continues to climb, sleepier. Family bedside; pt feeling worse with increasing SOB, nausea, fatigue     Objective:   BP 135/70 (BP Location: Right Arm)   Pulse 76   Temp 97.8 F (36.6 C)   Resp 18   Ht 5' 3 (1.6 m)   Wt 76.9 kg   SpO2 99%   BMI 30.03 kg/m   Intake/Output Summary (Last 24 hours) at 06/23/2023 1336 Last data filed at  06/23/2023 0133 Gross per 24 hour  Intake 100 ml  Output 450 ml  Net -350 ml   Weight change:   Physical Exam: Gen:NAD CVS: RRR Resp: clear ZOX:WRUE Ext:tr LE edema  Imaging: No results found.  Labs: BMET Recent Labs  Lab 06/17/23 1238 06/18/23 0541 06/19/23 0536 06/20/23 0503 06/21/23 0548 06/22/23 0518 06/23/23 0524  NA 137 136 137 139 140 138 143  K 4.4 3.5 3.1* 3.5 3.8 3.9 3.5  CL 105 112* 115* 117* 116* 116* 117*  CO2 17* 18* 16* 15* 13* 10* 13*  GLUCOSE 90 140* 120* 137* 97 113* 130*  BUN 30* 37* 42* 45* 51* 61* 72*  CREATININE 2.79* 2.71* 2.96* 3.30* 4.03* 5.20* 5.69*  CALCIUM  8.5* 7.2* 7.0* 6.9* 6.7* 7.0* 8.0*  PHOS  --   --   --   --   --   --  6.2*   CBC Recent Labs  Lab 06/20/23 0503 06/21/23 0548 06/22/23 0518 06/23/23 0524  WBC 10.1 13.1* 10.9* 10.0  HGB 12.5 13.0 13.3 13.2  HCT 37.6 38.1 40.5 38.7  MCV 90.6 88.6 92.0 88.8  PLT 132* 127* 123* 116*    Medications:     docusate sodium  100 mg Oral BID   dorzolamide-timolol  1 drop Both Eyes BID   ezetimibe   10 mg Oral Daily   latanoprost  1 drop Both Eyes QHS   sodium bicarbonate  1,300 mg Oral TID    Aloysius Janus MD 06/23/2023, 1:36 PM

## 2023-06-23 NOTE — Progress Notes (Signed)
 [facility]                                                                              Marissa Orozco, is a 78 y.o. female, DOB - 12-Nov-1945, ZOX:[id number] Admit date - 06/17/2023    Outpatient Primary MD for the patient is Cleave Curling, MD  LOS - 5  days  Chief Complaint  Patient presents with   Hypotension       Brief summary   Patient is a 78 y.o. female with medical history significant of  for hx of CVA, HTN, HLD, hx of kidney stones who presented to ED with complaints of low blood pressure and fatigue. She started to feel tired on Tuesday and stayed in bed all day. No other symptoms. She did check her blood pressure and it was around 110/70. She still was fatigued over the following 2 days. She has had poor PO intake over past several days as well.  On the day of admit, BP was 86/61, she called her PCP and was recommended to seek medical attention at ED.   Creatinine did not respond to IV fluids and blood pressure support so renal was consulted workup underway.     Assessment & Plan    Principal Problem:   AKI (acute kidney injury) (HCC),  NAG metabolic acidosis -Unclear etiology -Baseline creatinine  <1, presented with creatinine of 2.79, has continued to trend up, today 5.69 - Off home ARB, nephrology consulted  - Plan for renal biopsy on 06/27/2023, needs aspirin  washout for 5 days  - Continue to hold aspirin  -Feeling very tired, shortness of breath, nauseous, worsening acidosis, decreasing urine output, seen by nephrology, Dr. Austine Blunt today and recommended transfer to [facility] for tunneled HD cath and initiate dialysis   HTN (hypertension), malignant - Continue to hold valsartan  - BP stable   E. coli UTI - Urine culture showed more than 100,000 colonies of E. Coli - Continue IV Rocephin, treat as complicated UTI  x 7 days    Hyperlipidemia  - Continue Zetia  -Lipitor held    History of stroke -Hold aspirin  for pending renal biopsy -CK  elevated, hold Lipitor   Hypokalemia - Replace as needed   Obesity class I Estimated body mass index is 30.03 kg/m as calculated from the following:   Height as of this encounter: 5' 3 (1.6 m).   Weight as of this encounter: 76.9 kg.  Code Status: Full code DVT Prophylaxis:  Place and maintain sequential compression device Start: 06/20/23 1531   Level of Care: Level of care: Telemetry Medical Family Communication: Updated patient.  Patient reports that she has been in touch with her husband and will update him. Disposition Plan:      Remains inpatient appropriate:   Transfer to [facility] for further renal workup, hemodialysis.   Procedures:    Consultants:   Nephrology IR  Antimicrobials:   Anti-infectives (From admission, onward)    Start     Dose/Rate Route Frequency Ordered Stop   06/19/23 2000  cefTRIAXone (ROCEPHIN) 1 g in sodium chloride  0.9 % 100 mL IVPB        1  g 200 mL/hr over 30 Minutes Intravenous Every 24 hours 06/19/23 1928 06/26/23 1959          Medications  [START ON 06/24/2023] Chlorhexidine Gluconate Cloth  6 each Topical Q0600   docusate sodium  100 mg Oral BID   dorzolamide-timolol  1 drop Both Eyes BID   ezetimibe   10 mg Oral Daily   latanoprost  1 drop Both Eyes QHS   sodium bicarbonate  1,300 mg Oral TID      Subjective:   Marissa Orozco was seen and examined today.  Feeling very tired, increasing shortness of breath, nauseous, decreasing urine output.  No chest pain, no active vomiting, abdominal pain or diarrhea.  Objective:   Vitals:   06/22/23 1624 06/22/23 1626 06/22/23 2029 06/23/23 0609  BP: (!) 154/78 139/73 135/71 135/70  Pulse: 72 72 70 76  Resp:   16 18  Temp: 98.4 F (36.9 C)  98.5 F (36.9 C) 97.8 F (36.6 C)  TempSrc:      SpO2: 100% 100% 99% 99%  Weight:      Height:        Intake/Output Summary (Last 24 hours) at 06/23/2023 1347 Last data filed at 06/23/2023 0133 Gross per 24 hour  Intake 100 ml   Output 450 ml  Net -350 ml     Wt Readings from Last 3 Encounters:  06/21/23 76.9 kg  06/02/23 77.5 kg  02/16/23 76.7 kg    Physical Exam General: Tired and sleepy otherwise oriented x 3 Cardiovascular: S1 S2 clear, RRR.  Respiratory: CTAB, no wheezing Gastrointestinal: Soft, nontender, nondistended, NBS Ext: 1+ pedal edema bilaterally Neuro: no new deficits Psych: Normal affect      Data Reviewed:  I have personally reviewed following labs    CBC Lab Results  Component Value Date   WBC 10.0 06/23/2023   RBC 4.36 06/23/2023   HGB 13.2 06/23/2023   HCT 38.7 06/23/2023   MCV 88.8 06/23/2023   MCH 30.3 06/23/2023   PLT 116 (L) 06/23/2023   MCHC 34.1 06/23/2023   RDW 18.6 (H) 06/23/2023   LYMPHSABS 1.5 05/25/2018   MONOABS 0.6 11/05/2011   EOSABS 0.1 05/25/2018   BASOSABS 0.1 05/25/2018     Last metabolic panel Lab Results  Component Value Date   NA 143 06/23/2023   K 3.5 06/23/2023   CL 117 (H) 06/23/2023   CO2 13 (L) 06/23/2023   BUN 72 (H) 06/23/2023   CREATININE 5.69 (H) 06/23/2023   GLUCOSE 130 (H) 06/23/2023   GFRNONAA 7 (L) 06/23/2023   GFRAA 89 10/18/2019   CALCIUM  8.0 (L) 06/23/2023   PHOS 6.2 (H) 06/23/2023   PROT 6.4 02/16/2023   ALBUMIN 2.1 (L) 06/23/2023   LABGLOB 2.8 06/19/2023   AGRATIO 0.8 06/19/2023   BILITOT 1.2 02/16/2023   ALKPHOS 104 02/16/2023   AST 30 02/16/2023   ALT 30 02/16/2023   ANIONGAP 13 06/23/2023    CBG (last 3)  No results for input(s): GLUCAP in the last 72 hours.    Coagulation Profile: Recent Labs  Lab 06/22/23 0518  INR 1.4*     Radiology Studies: I have personally reviewed the imaging studies  No results found.     Bertram Brocks M.D. [facility] 06/23/2023, 1:47 PM  Available via Epic secure chat 7am-7pm After 7 pm, please refer to night coverage provider listed on amion.

## 2023-06-23 NOTE — Plan of Care (Signed)

## 2023-06-23 NOTE — Progress Notes (Addendum)
 NEW ADMISSION NOTE New Admission Note:   Arrival Method: Patient arrived from [facility] via [facility]. Mental Orientation: Alert and oriented x 4. Telemetry: 16M o3 NSR Assessment: Completed Skin: Warm dry and intact. IV: Left FA SL Pain: denies any pain. Tubes: N/A Safety Measures: Safety Fall Prevention Plan has been given, discussed and implemented. Admission: Completed [facility] Orientation: Patient has been orientated to the room, unit and staff.  Family: at bedside  Orders have been reviewed and implemented. Will continue to monitor the patient. Call light has been placed within reach and bed alarm has been activated.   Deberah Falconer, RN

## 2023-06-23 NOTE — Progress Notes (Signed)
 Report given to [facility] charge nurse

## 2023-06-24 ENCOUNTER — Inpatient Hospital Stay (HOSPITAL_COMMUNITY)

## 2023-06-24 DIAGNOSIS — N179 Acute kidney failure, unspecified: Secondary | ICD-10-CM | POA: Diagnosis not present

## 2023-06-24 DIAGNOSIS — E785 Hyperlipidemia, unspecified: Secondary | ICD-10-CM | POA: Diagnosis not present

## 2023-06-24 HISTORY — PX: IR US GUIDE VASC ACCESS RIGHT: IMG2390

## 2023-06-24 HISTORY — PX: IR FLUORO GUIDE CV LINE RIGHT: IMG2283

## 2023-06-24 LAB — RENAL FUNCTION PANEL
Albumin: 2.1 g/dL — ABNORMAL LOW (ref 3.5–5.0)
Anion gap: 13 (ref 5–15)
BUN: 71 mg/dL — ABNORMAL HIGH (ref 8–23)
CO2: 14 mmol/L — ABNORMAL LOW (ref 22–32)
Calcium: 8.4 mg/dL — ABNORMAL LOW (ref 8.9–10.3)
Chloride: 117 mmol/L — ABNORMAL HIGH (ref 98–111)
Creatinine, Ser: 5.47 mg/dL — ABNORMAL HIGH (ref 0.44–1.00)
GFR, Estimated: 7 mL/min — ABNORMAL LOW (ref >60)
Glucose, Bld: 97 mg/dL (ref 70–99)
Phosphorus: 6.6 mg/dL — ABNORMAL HIGH (ref 2.5–4.6)
Potassium: 3.4 mmol/L — ABNORMAL LOW (ref 3.5–5.1)
Sodium: 144 mmol/L (ref 135–145)

## 2023-06-24 LAB — CBC
HCT: 40.6 % (ref 36.0–46.0)
Hemoglobin: 14 g/dL (ref 12.0–15.0)
MCH: 29.7 pg (ref 26.0–34.0)
MCHC: 34.5 g/dL (ref 30.0–36.0)
MCV: 86 fL (ref 80.0–100.0)
Platelets: 121 10*3/uL — ABNORMAL LOW (ref 150–400)
RBC: 4.72 MIL/uL (ref 3.87–5.11)
RDW: 19 % — ABNORMAL HIGH (ref 11.5–15.5)
WBC: 10.8 10*3/uL — ABNORMAL HIGH (ref 4.0–10.5)
nRBC: 0 % (ref 0.0–0.2)

## 2023-06-24 LAB — HEPATITIS B SURFACE ANTIBODY, QUANTITATIVE: Hep B S AB Quant (Post): 347 m[IU]/mL

## 2023-06-24 MED ORDER — MIDAZOLAM HCL 2 MG/2ML IJ SOLN
INTRAMUSCULAR | Status: AC | PRN
  Administered 2023-06-24: 1 mg via INTRAVENOUS

## 2023-06-24 MED ORDER — MIDAZOLAM HCL 2 MG/2ML IJ SOLN
INTRAMUSCULAR | Status: AC
  Filled 2023-06-24: qty 2

## 2023-06-24 MED ORDER — CEFAZOLIN SODIUM-DEXTROSE 2-4 GM/100ML-% IV SOLN: 2.0000 g | Freq: Once | INTRAVENOUS | Status: DC

## 2023-06-24 MED ORDER — LIDOCAINE HCL 1 % IJ SOLN
INTRAMUSCULAR | Status: AC
Start: 2023-06-24 — End: 2023-06-24
  Filled 2023-06-24: qty 20

## 2023-06-24 MED ORDER — CEFAZOLIN SODIUM-DEXTROSE 2-4 GM/100ML-% IV SOLN
2.0000 g | Freq: Once | INTRAVENOUS | Status: AC
  Administered 2023-06-24: 2 g via INTRAVENOUS

## 2023-06-24 MED ORDER — HEPARIN SODIUM (PORCINE) 1000 UNIT/ML IJ SOLN
INTRAMUSCULAR | Status: AC
  Filled 2023-06-24: qty 10

## 2023-06-24 MED ORDER — FENTANYL CITRATE (PF) 100 MCG/2ML IJ SOLN
INTRAMUSCULAR | Status: AC
Start: 2023-06-24 — End: 2023-06-24
  Filled 2023-06-24: qty 2

## 2023-06-24 MED ORDER — FENTANYL CITRATE (PF) 100 MCG/2ML IJ SOLN
INTRAMUSCULAR | Status: AC | PRN
  Administered 2023-06-24: 50 ug via INTRAVENOUS

## 2023-06-24 MED ORDER — CEFAZOLIN SODIUM-DEXTROSE 2-4 GM/100ML-% IV SOLN
INTRAVENOUS | Status: AC
  Filled 2023-06-24: qty 100

## 2023-06-24 MED ORDER — FUROSEMIDE 10 MG/ML IJ SOLN
40.0000 mg | Freq: Two times a day (BID) | INTRAMUSCULAR | Status: DC
  Administered 2023-06-24 – 2023-07-01 (×14): 40 mg via INTRAVENOUS
  Filled 2023-06-24 (×14): qty 4

## 2023-06-24 NOTE — Progress Notes (Signed)
 [facility] PROGRESS NOTE    Progress Note  JERELENE SALAAM  WUJ:[id number] DOB: November 03, 1945 DOA: 06/17/2023 PCP: Cleave Curling, MD     Brief Narrative:   Marissa Orozco is an 78 y.o. female past medical history of CVA, essential hypertension hyperlipidemia history of kidney stones coming to the ED complaining of fatigue was found to have low blood pressure, she relates significant poor intake of less than the days, called her PCP to told her to come to the ED.  Was found to be in acute kidney injury  Assessment/Plan:   ATN/normal anion gap metabolic acidosis: With recent creatinine of less than 1. Her admission 2.7 is continued to climb up to 5.6. ARB was held nephrology was consulted.  UA showed large amounts white blood cells and protein.  C3 and C4 complements are low, ANA positive as well as p-ANCA The plan for renal biopsy on 06/27/2023 by IR, awaiting aspirin  washout for 5 days.  Concerned about glomerulonephritis. No M spike on SPEP. Complaint for tunneled catheter and possible dialysis today. Renal panel is pending this morning  Essential  HTN (hypertension), malignant Holding ARB in the setting of ATN. Blood pressure continues to be stable. Continue hydralazine.  E. coli UTI: Will complete a 7 day course of IV Rocephin.  Hyperlipidemia: Continue Zetia  and Lipitor.  History of CVA: Holding aspirin  and statins.  Hypokalemia: Replete and recheck.  Try to keep potassium greater than 4 magnesium  greater than 2.  DVT prophylaxis: lovenox Family Communication:none Status is: Inpatient Remains inpatient appropriate because: 32    Code Status:     Code Status Orders  (From admission, onward)           Start     Ordered   06/17/23 2120  Full code  Continuous       Question:  By:  Answer:  Consent: discussion documented in EHR   06/17/23 2120           Code Status History     Date Active Date Inactive Code Status Order ID Comments User  Context   09/07/2011 1540 09/08/2011 1413 Full Code [id number]  Darryle Ends, PA-C ED         IV Access:   Peripheral IV   Procedures and diagnostic studies:   No results found.   Medical Consultants:   None.   Subjective:    Leta Rattan.  She feels tired and fatigue  Objective:    Vitals:   06/23/23 1700 06/23/23 2105 06/23/23 2126 06/24/23 0538  BP:  (!) 155/99 (!) 152/79 (!) 145/80  Pulse:  81 80 73  Resp:  19 18 18   Temp:  97.6 F (36.4 C) 98.1 F (36.7 C) 97.8 F (36.6 C)  TempSrc:  Oral Oral Oral  SpO2:  100% 100% 99%  Weight: 84 kg     Height: 5' 3.5 (1.613 m)      SpO2: 99 %   Intake/Output Summary (Last 24 hours) at 06/24/2023 0644 Last data filed at 06/23/2023 2200 Gross per 24 hour  Intake 920 ml  Output 0 ml  Net 920 ml   Filed Weights   06/20/23 0538 06/21/23 0500 06/23/23 1700  Weight: 76.2 kg 76.9 kg 84 kg    Exam: General exam: In no acute distress. Respiratory system: Good air movement and clear to auscultation. Cardiovascular system: S1 & S2 heard, RRR.  No JVD. Gastrointestinal system: Abdomen is nondistended, soft and nontender.  Extremities: No pedal edema. Skin: No  rashes, lesions or ulcers Psychiatry: Judgement and insight appear normal. Mood & affect appropriate.    Data Reviewed:    Labs: Basic Metabolic Panel: Recent Labs  Lab 06/19/23 0536 06/20/23 0503 06/21/23 0548 06/22/23 0518 06/23/23 0524  NA 137 139 140 138 143  K 3.1* 3.5 3.8 3.9 3.5  CL 115* 117* 116* 116* 117*  CO2 16* 15* 13* 10* 13*  GLUCOSE 120* 137* 97 113* 130*  BUN 42* 45* 51* 61* 72*  CREATININE 2.96* 3.30* 4.03* 5.20* 5.69*  CALCIUM  7.0* 6.9* 6.7* 7.0* 8.0*  PHOS  --   --   --   --  6.2*   GFR Estimated Creatinine Clearance: 8.3 mL/min (A) (by C-G formula based on SCr of 5.69 mg/dL (H)). Liver Function Tests: Recent Labs  Lab 06/23/23 0524  ALBUMIN 2.1*   No results for input(s): LIPASE, AMYLASE in the last 168 hours. No  results for input(s): AMMONIA in the last 168 hours. Coagulation profile Recent Labs  Lab 06/22/23 0518  INR 1.4*   COVID-19 Labs  No results for input(s): DDIMER, FERRITIN, LDH, CRP in the last 72 hours.  No results found for: SARSCOV2NAA  CBC: Recent Labs  Lab 06/18/23 0541 06/20/23 0503 06/21/23 0548 06/22/23 0518 06/23/23 0524  WBC 7.7 10.1 13.1* 10.9* 10.0  HGB 13.0 12.5 13.0 13.3 13.2  HCT 38.9 37.6 38.1 40.5 38.7  MCV 90.9 90.6 88.6 92.0 88.8  PLT 153 132* 127* 123* 116*   Cardiac Enzymes: Recent Labs  Lab 06/19/23 2019  CKTOTAL 516*   BNP (last 3 results) No results for input(s): PROBNP in the last 8760 hours. CBG: No results for input(s): GLUCAP in the last 168 hours. D-Dimer: No results for input(s): DDIMER in the last 72 hours. Hgb A1c: No results for input(s): HGBA1C in the last 72 hours. Lipid Profile: No results for input(s): CHOL, HDL, LDLCALC, TRIG, CHOLHDL, LDLDIRECT in the last 72 hours. Thyroid  function studies: No results for input(s): TSH, T4TOTAL, T3FREE, THYROIDAB in the last 72 hours.  Invalid input(s): FREET3 Anemia work up: No results for input(s): VITAMINB12, FOLATE, FERRITIN, TIBC, IRON, RETICCTPCT in the last 72 hours. Sepsis Labs: Recent Labs  Lab 06/20/23 0503 06/21/23 0548 06/22/23 0518 06/23/23 0524  WBC 10.1 13.1* 10.9* 10.0   Microbiology Recent Results (from the past 240 hours)  Urine Culture (for pregnant, neutropenic or urologic patients or patients with an indwelling urinary catheter)     Status: Abnormal   Collection Time: 06/17/23 10:24 PM   Specimen: Urine, Clean Catch  Result Value Ref Range Status   Specimen Description   Final    URINE, CLEAN CATCH Performed at [facility], [address], [place], [place] [address]    Special Requests   Final    NONE Performed at [facility], [address], [place],  [place] [address]    Culture >=100,000 COLONIES/mL ESCHERICHIA COLI (A)  Final   Report Status 06/20/2023 FINAL  Final   Organism ID, Bacteria ESCHERICHIA COLI (A)  Final      Susceptibility   Escherichia coli - MIC*    AMPICILLIN <=2 SENSITIVE Sensitive     CEFAZOLIN <=4 SENSITIVE Sensitive     CEFEPIME <=0.12 SENSITIVE Sensitive     CEFTRIAXONE <=0.25 SENSITIVE Sensitive     CIPROFLOXACIN <=0.25 SENSITIVE Sensitive     GENTAMICIN <=1 SENSITIVE Sensitive     IMIPENEM <=0.25 SENSITIVE Sensitive     NITROFURANTOIN  <=16 SENSITIVE Sensitive  TRIMETH/SULFA <=20 SENSITIVE Sensitive     AMPICILLIN/SULBACTAM <=2 SENSITIVE Sensitive     PIP/TAZO <=4 SENSITIVE Sensitive ug/mL    * >=100,000 COLONIES/mL ESCHERICHIA COLI     Medications:    Chlorhexidine Gluconate Cloth  6 each Topical Q0600   docusate sodium  100 mg Oral BID   dorzolamide-timolol  1 drop Both Eyes BID   ezetimibe   10 mg Oral Daily   latanoprost  1 drop Both Eyes QHS   sodium bicarbonate  1,300 mg Oral TID   Continuous Infusions:  cefTRIAXone (ROCEPHIN)  IV 1 g (06/23/23 2051)      LOS: 6 days   Macdonald Savoy  [facility]  06/24/2023, 6:44 AM

## 2023-06-24 NOTE — Consult Note (Signed)
 Chief Complaint: Acute kidney injury; dialysis needs - image guided tunneled hemodialysis catheter placement   Referring Provider(s): Rai, Ripudeep   Supervising Physician: Marland Silvas  Patient Status: [facility] - In-pt  History of Present Illness: DEZIYAH Orozco is a 78 y.o. female with history of heart murmur, hyperlipidemia, hypertension, kidney stones, neuromuscular disorder, and recently diagnosed acute kidney injury.  Pt is known to us  from renal biopsy consult earlier this week 06/22/23 that is planned for Monday 06/27/23 due to anticoagulation holds.  Pt is now with proteinuria, microscopic hematuria, UTI, and continued worsening acute kidney injury necessitating hemodialysis.  Interventional radiology was consulted for image guided tunneled hemodialysis catheter placement.    Allergies to hydrochlorothiazide.    Does not use supplemental O2 or CPAP at home. On RA IP   Patient is Full Code  Past Medical History:  Diagnosis Date   Allergy 2000   Breast lump    Cataract    Colon polyps    Fibroids    GERD (gastroesophageal reflux disease)    Glaucoma    Heart murmur    Hyperlipidemia 01/05/1984   Hypertension 01/05/1984   Interstitial cystitis 01/04/1997   Kidney stones 01/05/2003   Neuromuscular disorder (HCC)    Osteoporosis    Preop cardiovascular exam 10/25/2022   Sleep apnea 2021   Tonsillitis     Past Surgical History:  Procedure Laterality Date   ABDOMINAL HYSTERECTOMY  1997   BREAST EXCISIONAL BIOPSY Right 07/27/2006   Intraductal Papilloma   BREAST SURGERY  2008   CATARACT EXTRACTION Bilateral 02/2020   other in 05/2020   COLONOSCOPY  2010   Dr Tova Fresh   EYE SURGERY  2022   HYSTEROSCOPY  1993   LAPAROSCOPIC HYSTERECTOMY  1994   MYOMECTOMY  1993   excision of tumor   POLYPECTOMY     TONSILLECTOMY  1953   TUBAL LIGATION  1980    Allergies: Hydrochlorothiazide  Medications: Prior to Admission medications   Medication Sig Start Date End  Date Taking? Authorizing Provider  amLODipine  (NORVASC ) 5 MG tablet TAKE 1 TABLET BY MOUTH DAILY Patient taking differently: Take 5 mg by mouth at bedtime. 11/22/22  Yes Cleave Curling, MD  aspirin  EC 81 MG tablet Take 81 mg by mouth daily. Swallow whole.   Yes [provider]  atorvastatin  (LIPITOR) 80 MG tablet TAKE 1 TABLET BY MOUTH DAILY 09/08/22  Yes Cleave Curling, MD  Calcium  Carbonate (CALCIUM  500 PO) Take 500 mg by mouth daily with breakfast.   Yes [provider]  Cholecalciferol (VITAMIN D3) 50 MCG (2000 UT) TABS Take 2,000 Units by mouth daily with breakfast.   Yes [provider]  dorzolamide-timolol (COSOPT) 22.3-6.8 MG/ML ophthalmic solution Place 1 drop into both eyes in the morning and at bedtime. 02/02/20  Yes [provider]  ezetimibe  (ZETIA ) 10 MG tablet Take 1 tablet (10 mg total) by mouth daily. 02/16/23  Yes Cleave Curling, MD  finasteride (PROSCAR) 5 MG tablet Take 5 mg by mouth daily.   Yes [provider]  latanoprost (XALATAN) 0.005 % ophthalmic solution Place 1 drop into both eyes at bedtime. 03/17/13  Yes [provider]  valsartan  (DIOVAN ) 160 MG tablet Take 1 tablet (160 mg total) by mouth daily. 02/16/23 02/16/24 Yes Cleave Curling, MD     Family History  Problem Relation Age of Onset   Hypertension Mother    Heart disease Mother    Colon cancer Mother 52   Arthritis Mother  Cancer Mother    Coronary artery disease Father        mother, PGM   Stroke Father    Hypertension Father    Colon cancer Father 39   Cancer Father    Colon cancer Maternal Grandmother    Diabetes Other    Breast cancer Neg Hx    Colon polyps Neg Hx    Esophageal cancer Neg Hx    Stomach cancer Neg Hx    Rectal cancer Neg Hx    BRCA 1/2 Neg Hx     Social History   Socioeconomic History   Marital status: Married    Spouse name: Not on file   Number of children: 2   Years of education: Not on file   Highest education level:  Doctorate  Occupational History   Occupation: retired   Occupation: retired  Tobacco Use   Smoking status: Never   Smokeless tobacco: Never  Vaping Use   Vaping status: Never Used  Substance and Sexual Activity   Alcohol use: Yes    Alcohol/week: 5.0 standard drinks of alcohol    Types: 5 Glasses of wine per week   Drug use: No   Sexual activity: Not Currently  Other Topics Concern   Not on file  Social History Narrative   Not on file   Social Drivers of Health   Financial Resource Strain: Low Risk  (02/12/2023)   Overall Financial Resource Strain (CARDIA)    Difficulty of Paying Living Expenses: Not hard at all  Food Insecurity: No Food Insecurity (06/17/2023)   Hunger Vital Sign    Worried About Running Out of Food in the Last Year: Never true    Ran Out of Food in the Last Year: Never true  Transportation Needs: No Transportation Needs (06/17/2023)   PRAPARE - Administrator, Civil Service (Medical): No    Lack of Transportation (Non-Medical): No  Physical Activity: Sufficiently Active (02/12/2023)   Exercise Vital Sign    Days of Exercise per Week: 5 days    Minutes of Exercise per Session: 40 min  Stress: No Stress Concern Present (02/12/2023)   [employer] of Occupational Health - Occupational Stress Questionnaire    Feeling of Stress : Not at all  Social Connections: Moderately Isolated (06/17/2023)   Social Connection and Isolation Panel    Frequency of Communication with Friends and Family: More than three times a week    Frequency of Social Gatherings with Friends and Family: Once a week    Attends Religious Services: Never    Database administrator or Organizations: No    Attends Engineer, structural: Never    Marital Status: Married     Review of Systems: A 12 point ROS discussed and pertinent positives are indicated in the HPI above.  All other systems are negative.  Review of Systems  Constitutional:  Positive for fatigue. Negative  for chills and fever.  Respiratory:  Positive for shortness of breath.   Cardiovascular:  Positive for leg swelling. Negative for chest pain.  Gastrointestinal:  Negative for abdominal pain, blood in stool, nausea and vomiting.  Genitourinary:  Negative for hematuria.  Skin:  Negative for rash and wound.  Neurological:  Positive for weakness. Negative for headaches.       Generalized weakness  Hematological:  Does not bruise/bleed easily.  Psychiatric/Behavioral: Negative.      Vital Signs: BP (!) (P) 151/63 (BP Location: Right Arm)   Pulse (P) 80  Temp (P) 97.7 F (36.5 C) (Oral)   Resp 18   Ht 5' 3.5 (1.613 m)   Wt 185 lb 3 oz (84 kg)   SpO2 (P) 99%   BMI 32.29 kg/m   Advance Care Plan: The advanced care place/surrogate decision maker was discussed at the time of visit and the patient did not wish to discuss or was not able to name a surrogate decision maker or provide an advance care plan.  Physical Exam HENT:     Mouth/Throat:     Mouth: Mucous membranes are dry.     Pharynx: Oropharynx is clear.   Cardiovascular:     Rate and Rhythm: Normal rate and regular rhythm.     Pulses: Normal pulses.  Pulmonary:     Effort: Pulmonary effort is normal.     Breath sounds: Normal breath sounds.     Comments: Notably exertional dyspnea Abdominal:     Palpations: Abdomen is soft.     Tenderness: There is no abdominal tenderness.   Musculoskeletal:     Right lower leg: Edema present.     Left lower leg: Edema present.     Comments: 3+ bilateral lower ext edema    Skin:    General: Skin is warm and dry.     Comments: No wounds or rash R neck or chest   Neurological:     Mental Status: She is alert and oriented to person, place, and time. Mental status is at baseline.   Psychiatric:        Mood and Affect: Mood normal.        Behavior: Behavior normal.        Thought Content: Thought content normal.        Judgment: Judgment normal.     Imaging: US  RENAL Result  Date: 06/17/2023 CLINICAL DATA:  Acute kidney injury EXAM: RENAL / URINARY TRACT ULTRASOUND COMPLETE COMPARISON:  None Available. FINDINGS: Right Kidney: Renal measurements: 10.2 x 5.3 x 5.1 cm = volume: 142.5 mL. Cortex appears slightly echogenic. No hydronephrosis. Echogenic solid mass upper pole right kidney measuring 10 x 9 x 10 mm. Left Kidney: Renal measurements: 9.5 x 5.5 x 4.6 cm = volume: 124.7 mL. Echogenicity within normal limits. No mass or hydronephrosis Bladder: Appears normal for degree of bladder distention. Other: None. IMPRESSION: 1. No hydronephrosis. Slightly echogenic right renal cortex as may be seen with medical renal disease. 2. 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 Recommend follow-up ultrasound in 6 months. Electronically Signed   By: Esmeralda Hedge M.D.   On: 06/17/2023 23:03   DG Chest Port 1 View Result Date: 06/17/2023 CLINICAL DATA:  Fatigue and malaise for 3 days. EXAM: PORTABLE CHEST 1 VIEW COMPARISON:  09/07/2011. FINDINGS: Low lung volume. Bilateral lung fields are clear. Bilateral costophrenic angles are clear. Mildly enlarged cardio-mediastinal silhouette, likely accentuated by low lung volume and AP technique. No acute osseous abnormalities. The soft tissues are within normal limits. IMPRESSION: No active disease. Electronically Signed   By: Beula Brunswick M.D.   On: 06/17/2023 12:38   MM 3D SCREENING MAMMOGRAM BILATERAL BREAST Result Date: 05/27/2023 CLINICAL DATA:  Screening. EXAM: DIGITAL SCREENING BILATERAL MAMMOGRAM WITH TOMOSYNTHESIS AND CAD TECHNIQUE: Bilateral screening digital craniocaudal and mediolateral oblique mammograms were obtained. Bilateral screening digital breast tomosynthesis was performed. The images were evaluated with computer-aided detection. COMPARISON:  Previous exam(s). ACR Breast Density Category b: There are scattered areas of fibroglandular density. FINDINGS: There are no findings suspicious  for  malignancy. IMPRESSION: No mammographic evidence of malignancy. A result letter of this screening mammogram will be mailed directly to the patient. RECOMMENDATION: Screening mammogram in one year. (Code:[id number]) BI-RADS CATEGORY  1: Negative. Electronically Signed   By: Anna Barnes M.D.   On: 05/27/2023 16:06    Labs:  CBC: Recent Labs    06/21/23 0548 06/22/23 0518 06/23/23 0524 06/24/23 0654  WBC 13.1* 10.9* 10.0 10.8*  HGB 13.0 13.3 13.2 14.0  HCT 38.1 40.5 38.7 40.6  PLT 127* 123* 116* 121*    COAGS: Recent Labs    06/22/23 0518  INR 1.4*    BMP: Recent Labs    06/21/23 0548 06/22/23 0518 06/23/23 0524 06/24/23 0654  NA 140 138 143 144  K 3.8 3.9 3.5 3.4*  CL 116* 116* 117* 117*  CO2 13* 10* 13* 14*  GLUCOSE 97 113* 130* 97  BUN 51* 61* 72* 71*  CALCIUM  6.7* 7.0* 8.0* 8.4*  CREATININE 4.03* 5.20* 5.69* 5.47*  GFRNONAA 11* 8* 7* 7*    LIVER FUNCTION TESTS: Recent Labs    09/15/22 0925 02/16/23 1059 06/23/23 0524 06/24/23 0654  BILITOT 1.3* 1.2  --   --   AST 22 30  --   --   ALT 21 30  --   --   ALKPHOS 91 104  --   --   PROT 6.2 6.4  --   --   ALBUMIN 4.1 4.0 2.1* 2.1*    TUMOR MARKERS: No results for input(s): AFPTM, CEA, CA199, CHROMGRNA in the last 8760 hours.  Assessment and Plan:  Pt is with acute kidney injury and dialysis needs scheduled for image guided tunneled hemodialysis catheter placement.    Risks and benefits discussed with the patient including, but not limited to bleeding, infection, vascular injury, pneumothorax which may require chest tube placement, air embolism or even death  All of the patient's questions were answered, patient is agreeable to proceed. Consent signed and in chart.  Thank you for allowing our service to participate in VALEREE LEIDY 's care.  Electronically Signed: Terressa Fess, NP  06/24/2023, 9:35 AM    I spent a total of 20 Minutes    in face to face in clinical consultation,  greater than 50% of which was counseling/coordinating care for image guided tunneled hemodialysis catheter placement.

## 2023-06-24 NOTE — Progress Notes (Signed)
 [facility] Progress Note   Assessment/ Plan:   # AKI - Ischemic and prerenal insults - hypotension at home, ARB use, and  UTI.  Note concurrent microscopic hematuria and subnephrotic range proteinuria.  Baseline Cr < 1 - Remain off of home ARB at this time - Daily weights and strict I&O - ANA + and complement both low - DSDNA, dsDNA, antiSM neg; ANCA P, - SPEP no M-paraprotein, K/L high but from renal failure - needs a biopsy- have ordered, appreciate VIR seeing and biopsy scheduled for Monday 6/23  for aspirin  washout - Feeling worse with renal function going the wrong direction as well, worsening acidosis, nausea, decreasing urine output ` ->  transferred to [facility] for a tunneled catheter and to initiate dialysis today-  even though crt improved a little-  still acidotic and uremic-  cont with previous plan   # Hypertension - As above, remain off of ARB for now in the setting of AKI - Noted report of hypotension prior to admission - Acceptable on current regimen  - will add some lasix given inc UOP and volume overload    # Proteinuria  - up/cr ratio 2530 mg/g  - GN w/u as above - off of ARB for AKI      # Microscopic hematuria  - Note UTI which may explain - abx per primary team  - GN w/u as above     # UTI - E coli UTI - Ceftriaxone daily started on 6/15 - will need 1 week of therapy for complicated UTI with hematuria    # Pre-DM - Note A1c 6.2 on 06/02/23 - Optimizing pre-DM can reduce risk of CKD progression   # Angiomyolipoma - Probable angiomyolipoma on renal US  - Radiology recommended a follow-up renal ultrasound in 6 months   # Hypokalemia - repleted   Subjective:    Got transferred to [facility].  Plan is for [facility] and dialysis today.  Interestingly BUN and crt improved a little today -  uOP not well recorded but she thinks more-  feels terrible    Objective:   BP (!) (P) 151/63 (BP Location: Right Arm)   Pulse (P) 80   Temp (P) 97.7 F (36.5 C)  (Oral)   Resp 18   Ht 5' 3.5 (1.613 m)   Wt 84 kg   SpO2 (P) 99%   BMI 32.29 kg/m   Intake/Output Summary (Last 24 hours) at 06/24/2023 1202 Last data filed at 06/24/2023 0800 Gross per 24 hour  Intake 1156 ml  Output 0 ml  Net 1156 ml   Weight change:   Physical Exam: Gen:NAD CVS: RRR Resp: clear GNF:AOZH Ext:  1-2+ LE edema  Imaging: No results found.  Labs: BMET Recent Labs  Lab 06/18/23 0541 06/19/23 0536 06/20/23 0503 06/21/23 0548 06/22/23 0518 06/23/23 0524 06/24/23 0654  NA 136 137 139 140 138 143 144  K 3.5 3.1* 3.5 3.8 3.9 3.5 3.4*  CL 112* 115* 117* 116* 116* 117* 117*  CO2 18* 16* 15* 13* 10* 13* 14*  GLUCOSE 140* 120* 137* 97 113* 130* 97  BUN 37* 42* 45* 51* 61* 72* 71*  CREATININE 2.71* 2.96* 3.30* 4.03* 5.20* 5.69* 5.47*  CALCIUM  7.2* 7.0* 6.9* 6.7* 7.0* 8.0* 8.4*  PHOS  --   --   --   --   --  6.2* 6.6*   CBC Recent Labs  Lab 06/21/23 0548 06/22/23 0518 06/23/23 0524 06/24/23 0654  WBC 13.1* 10.9* 10.0 10.8*  HGB 13.0 13.3 13.2 14.0  HCT 38.1 40.5 38.7 40.6  MCV 88.6 92.0 88.8 86.0  PLT 127* 123* 116* 121*    Medications:     Chlorhexidine Gluconate Cloth  6 each Topical Q0600   docusate sodium  100 mg Oral BID   dorzolamide-timolol  1 drop Both Eyes BID   ezetimibe   10 mg Oral Daily   latanoprost  1 drop Both Eyes QHS   sodium bicarbonate  1,300 mg Oral TID    Bristyl Mclees A Rishawn Walck  06/24/2023, 12:02 PM

## 2023-06-24 NOTE — TOC CM/SW Note (Signed)
 Transition of Care [facility]) - Inpatient Brief Assessment   Patient Details  Name: Marissa Orozco MRN: [id number] Date of Birth: 1945/03/06  Transition of Care [facility]) CM/SW Contact:    Tom-Johnson, Angelique Ken, RN Phone Number: 06/24/2023, 1:10 PM   Clinical Narrative:  Patient transferred from [facility] to [facility] for Tunneled Hemodialysis Catheter placement and initiation of Hemodialysis d/t worsening Kidney Failure. Patient underwent TDC placement today and will start first HD session today. Nephrology following. Patient is also being treated for UTI, on IV abx.   From home with husband, has two supportive children. Independent with care and drive self. Retired. Has a shower seat at home.  PCP is Cleave Curling, MD and uses [facility] on [address].   Patient not Medically ready for discharge.  CM will continue to follow as patient progresses with care towards discharge.        Transition of Care Asessment: Insurance and Status: Insurance coverage has been reviewed Patient has primary care physician: Yes Home environment has been reviewed: home with spouse Prior level of function:: indepednent Prior/Current Home Services: No current home services Social Drivers of Health Review: SDOH reviewed no interventions necessary Readmission risk has been reviewed: Yes Transition of care needs: no transition of care needs at this time

## 2023-06-24 NOTE — Procedures (Signed)
  Procedure:  Tunneled R internal jugular HD CVC placement Palindrome 19 Preprocedure diagnosis: The primary encounter diagnosis was AKI (acute kidney injury) (HCC). A diagnosis of Dehydration was also pertinent to this visit. Postprocedure diagnosis: same EBL:    minimal Complications:   none immediate  See full dictation in [employer].  Nicky Barrack MD Main # [phone] Pager  [phone] Mobile [phone]

## 2023-06-24 NOTE — Plan of Care (Signed)
  Problem: Education: Goal: Knowledge of disease and its progression will improve Outcome: Progressing   

## 2023-06-24 NOTE — Progress Notes (Signed)
 Mobility Specialist Progress Note:   06/24/23 0826  Mobility  Activity Ambulated with assistance to bathroom  Level of Assistance Minimal assist, patient does 75% or more  Assistive Device Front wheel walker  Distance Ambulated (ft) 20 ft  Activity Response Tolerated well  Mobility Referral Yes  Mobility visit 1 Mobility  Mobility Specialist Start Time (ACUTE ONLY) 0809  Mobility Specialist Stop Time (ACUTE ONLY) 0817  Mobility Specialist Time Calculation (min) (ACUTE ONLY) 8 min   Pt received in bed, requesting assistance to BR. Required minA to come EOB and stand. Cues for walker management. Pt left in BR with instructions to pull call string.  D'Vante Nolon Baxter Mobility Specialist Please contact via Special educational needs teacher or Rehab office at [phone]

## 2023-06-25 DIAGNOSIS — E785 Hyperlipidemia, unspecified: Secondary | ICD-10-CM | POA: Diagnosis not present

## 2023-06-25 DIAGNOSIS — N179 Acute kidney failure, unspecified: Secondary | ICD-10-CM | POA: Diagnosis not present

## 2023-06-25 LAB — CBC
HCT: 39.5 % (ref 36.0–46.0)
Hemoglobin: 14.4 g/dL (ref 12.0–15.0)
MCH: 29.8 pg (ref 26.0–34.0)
MCHC: 36.5 g/dL — ABNORMAL HIGH (ref 30.0–36.0)
MCV: 81.8 fL (ref 80.0–100.0)
Platelets: 111 10*3/uL — ABNORMAL LOW (ref 150–400)
RBC: 4.83 MIL/uL (ref 3.87–5.11)
RDW: 18.3 % — ABNORMAL HIGH (ref 11.5–15.5)
WBC: 12.4 10*3/uL — ABNORMAL HIGH (ref 4.0–10.5)
nRBC: 0 % (ref 0.0–0.2)

## 2023-06-25 LAB — RENAL FUNCTION PANEL
Albumin: 2.1 g/dL — ABNORMAL LOW (ref 3.5–5.0)
Anion gap: 12 (ref 5–15)
BUN: 34 mg/dL — ABNORMAL HIGH (ref 8–23)
CO2: 21 mmol/L — ABNORMAL LOW (ref 22–32)
Calcium: 8 mg/dL — ABNORMAL LOW (ref 8.9–10.3)
Chloride: 107 mmol/L (ref 98–111)
Creatinine, Ser: 3.12 mg/dL — ABNORMAL HIGH (ref 0.44–1.00)
GFR, Estimated: 15 mL/min — ABNORMAL LOW (ref >60)
Glucose, Bld: 86 mg/dL (ref 70–99)
Phosphorus: 4.2 mg/dL (ref 2.5–4.6)
Potassium: 2.7 mmol/L — CL (ref 3.5–5.1)
Sodium: 140 mmol/L (ref 135–145)

## 2023-06-25 LAB — COMPLEMENT, TOTAL: Compl, Total (CH50): 26 U/mL — ABNORMAL LOW (ref >41)

## 2023-06-25 MED ORDER — ALTEPLASE 2 MG IJ SOLR: 2.0000 mg | Freq: Once | INTRAMUSCULAR | Status: DC | PRN

## 2023-06-25 MED ORDER — PENTAFLUOROPROP-TETRAFLUOROETH EX AERO: 1.0000 | INHALATION_SPRAY | CUTANEOUS | Status: DC | PRN

## 2023-06-25 MED ORDER — HEPARIN SODIUM (PORCINE) 1000 UNIT/ML DIALYSIS
1000.0000 [IU] | INTRAMUSCULAR | Status: DC | PRN
Start: 2023-06-25 — End: 2023-06-25

## 2023-06-25 MED ORDER — LIDOCAINE-PRILOCAINE 2.5-2.5 % EX CREA: 1.0000 | TOPICAL_CREAM | CUTANEOUS | Status: DC | PRN

## 2023-06-25 MED ORDER — ANTICOAGULANT SODIUM CITRATE 4% (200MG/5ML) IV SOLN: 5.0000 mL | Status: DC | PRN

## 2023-06-25 MED ORDER — LIDOCAINE HCL (PF) 1 % IJ SOLN: 5.0000 mL | INTRAMUSCULAR | Status: DC | PRN

## 2023-06-25 NOTE — Progress Notes (Signed)
 [facility] Progress Note   Assessment/ Plan:   # AKI - Ischemic and prerenal insults - hypotension at home, ARB use, and  UTI.  But with concurrent microscopic hematuria and subnephrotic range proteinuria.  Baseline Cr < 1 - Remain off of home ARB   - ANA + and complement both low - DSDNA, dsDNA, antiSM neg; ANCA P, - SPEP no M-paraprotein, K/L high but from renal failure - needs a biopsy- have ordered, appreciate VIR seeing and biopsy scheduled for Monday 6/23  after aspirin  washout - Feeling worse on 6/21 with renal function going the wrong direction as well, worsening acidosis, nausea, decreasing urine output ` ->  s/p TDC and HD #1 early this AM Plan for next HD on Monday if needed-  was showing signs of recovery of renal function -  eval with labs tomorrow    # Hypertension - As above, remain off of ARB for now in the setting of AKI - Noted report of hypotension prior to admission - Acceptable on current regimen  - added some lasix  given inc UOP and volume overload with good response   # Proteinuria  - up/cr ratio 2530 mg/g  - GN w/u as above - off of ARB for AKI      # Microscopic hematuria  - Note UTI which may explain - abx per primary team  - GN w/u as above     # UTI - E coli UTI - Ceftriaxone  daily started on 6/15 - will need 1 week of therapy for complicated UTI with hematuria    # Pre-DM - Note A1c 6.2 on 06/02/23 - Optimizing pre-DM can reduce risk of CKD progression   # Angiomyolipoma - Probable angiomyolipoma on renal US  - Radiology recommended a follow-up renal ultrasound in 6 months   # Hypokalemia - repleted   Subjective:    Ended up getting line done late yesterday-  did not get HD until overnight/early this AM-  also had 2100 of UOP-  she says she feels worse today than yesterday  I feel like I'm dying     Objective:   BP 117/60 (BP Location: Right Arm)   Pulse 67   Temp 97.7 F (36.5 C) (Oral)   Resp 17   Ht 5' 3.5  (1.613 m)   Wt 86.9 kg   SpO2 97%   BMI 33.40 kg/m   Intake/Output Summary (Last 24 hours) at 06/25/2023 1127 Last data filed at 06/25/2023 0600 Gross per 24 hour  Intake 340 ml  Output 2150 ml  Net -1810 ml   Weight change: 2.9 kg  Physical Exam: Gen:NAD CVS: RRR Resp: clear Jai:dnqu Ext:  1-2+ LE edema  Imaging: No results found.  Labs: BMET Recent Labs  Lab 06/19/23 0536 06/20/23 0503 06/21/23 0548 06/22/23 0518 06/23/23 0524 06/24/23 0654  NA 137 139 140 138 143 144  K 3.1* 3.5 3.8 3.9 3.5 3.4*  CL 115* 117* 116* 116* 117* 117*  CO2 16* 15* 13* 10* 13* 14*  GLUCOSE 120* 137* 97 113* 130* 97  BUN 42* 45* 51* 61* 72* 71*  CREATININE 2.96* 3.30* 4.03* 5.20* 5.69* 5.47*  CALCIUM  7.0* 6.9* 6.7* 7.0* 8.0* 8.4*  PHOS  --   --   --   --  6.2* 6.6*   CBC Recent Labs  Lab 06/21/23 0548 06/22/23 0518 06/23/23 0524 06/24/23 0654  WBC 13.1* 10.9* 10.0 10.8*  HGB 13.0 13.3 13.2 14.0  HCT 38.1 40.5 38.7 40.6  MCV  88.6 92.0 88.8 86.0  PLT 127* 123* 116* 121*    Medications:     Chlorhexidine  Gluconate Cloth  6 each Topical Q0600   docusate sodium   100 mg Oral BID   dorzolamide -timolol   1 drop Both Eyes BID   ezetimibe   10 mg Oral Daily   furosemide   40 mg Intravenous Q12H   latanoprost   1 drop Both Eyes QHS   sodium bicarbonate   1,300 mg Oral TID    Julizza Sassone A Maripat Borba  06/25/2023, 11:27 AM

## 2023-06-25 NOTE — Progress Notes (Signed)
 Received patient in bed to unit.  Alert and oriented.  Informed consent signed and in chart.   TX duration: 3 hours  Patient tolerated well.  Transported back to the room  Alert, without acute distress.  Hand-off given to patient's nurse.   Access used: catheter Access issues: none  Total UF removed: 2000 ml Medication(s) given: none Post HD VS: 100/65 Post HD weight: unable to obtain   06/25/23 0642  Vitals  Temp (!) 97.3 F (36.3 C)  Temp Source Oral  BP 100/65  MAP (mmHg) 77  BP Location Right Arm  BP Method Automatic  Patient Position (if appropriate) Lying  Pulse Rate 67  Pulse Rate Source Monitor  ECG Heart Rate 67  Resp 18  Oxygen Therapy  SpO2 100 %  O2 Device Room Air  Patient Activity (if Appropriate) In bed  Pulse Oximetry Type Continuous  During Treatment Monitoring  Blood Flow Rate (mL/min) 0 mL/min  Arterial Pressure (mmHg) -2.83 mmHg  Venous Pressure (mmHg) -2.22 mmHg  TMP (mmHg) 5.45 mmHg  Ultrafiltration Rate (mL/min) 1294 mL/min  Dialysate Flow Rate (mL/min) 299 ml/min  Duration of HD Treatment -hour(s) 3 hour(s)  Cumulative Fluid Removed (mL) per Treatment  2000.25  Hemodialysis Catheter Right Subclavian Double lumen Permanent (Tunneled)  Placement Date/Time: 06/24/23 1715   Serial / Lot #: [id number]  Expiration Date: 03/03/28  Time Out: Correct patient;Correct site;Correct procedure  Maximum sterile barrier precautions: Hand hygiene;Cap;Sterile gloves;Sterile gown;Mask;Large sterile s...  Site Condition No complications  Blue Lumen Status Infusing  Purple Lumen Status Flushed;Heparin  locked  Catheter fill solution Heparin  1000 units/ml  Catheter fill volume (Arterial) 1.6 cc  Catheter fill volume (Venous) 1.6  Dressing Type Transparent  Dressing Status Antimicrobial disc/dressing in place  Drainage Description None  Post treatment catheter status Capped and Clamped      [facility]

## 2023-06-25 NOTE — Plan of Care (Signed)
   Problem: Education: Goal: Knowledge of General Education information will improve Description: Including pain rating scale, medication(s)/side effects and non-pharmacologic comfort measures Outcome: Completed/Met

## 2023-06-25 NOTE — Progress Notes (Signed)
 [facility] PROGRESS NOTE    Progress Note  Marissa Orozco  FMW:[id number] DOB: 01-29-1945 DOA: 06/17/2023 PCP: Jarold Medici, MD     Brief Narrative:   Marissa Orozco is an 78 y.o. female past medical history of CVA, essential hypertension hyperlipidemia history of kidney stones coming to the ED complaining of fatigue was found to have low blood pressure, she relates significant poor intake of less than the days, called her PCP to told her to come to the ED.  Was found to be in acute kidney injury  Assessment/Plan:   ATN/normal anion gap metabolic acidosis/ uremic: With recent creatinine of less than 1. Her admission 2.7 is continued to climb up to 5.6. ARB was held nephrology was consulted.   UA showed large amounts white blood cells and protein.  C3 and C4 complements are low, ANA positive as well as p-ANCA The plan for renal biopsy on 06/27/2023 by IR, awaiting aspirin  washout for 5 days.  Concerned about glomerulonephritis. No M spike on SPEP. Complaint for tunneled catheter and possible dialysis today. Renal panel is pending this morning  Essential  HTN (hypertension), malignant Holding ARB in the setting of ATN. Blood pressure continues to be stable. Continue hydralazine.  E. coli UTI: Will complete a 7 day course of IV Rocephin .  Hyperlipidemia: Continue Zetia  and Lipitor.  History of CVA: Holding aspirin  and statins.  Hypokalemia: Replete and recheck.  Try to keep potassium greater than 4 magnesium  greater than 2.  DVT prophylaxis: lovenox Family Communication:none Status is: Inpatient Remains inpatient appropriate because: 62    Code Status:     Code Status Orders  (From admission, onward)           Start     Ordered   06/17/23 2120  Full code  Continuous       Question:  By:  Answer:  Consent: discussion documented in EHR   06/17/23 2120           Code Status History     Date Active Date Inactive Code Status Order ID Comments  User Context   09/07/2011 1540 09/08/2011 1413 Full Code [id number]  Schuyler Fermo, PA-C ED         IV Access:   Peripheral IV   Procedures and diagnostic studies:   No results found.   Medical Consultants:   None.   Subjective:    Marissa Orozco relates she still feels fatigue and tired. Objective:    Vitals:   06/25/23 0551 06/25/23 0621 06/25/23 0631 06/25/23 0642  BP: 114/71 (!) 85/55  100/65  Pulse: 64 74 (!) 59 67  Resp: 15 (!) 21 20 18   Temp:    (!) 97.3 F (36.3 C)  TempSrc:    Oral  SpO2: 100% 100% 99% 100%  Weight:      Height:       SpO2: 100 % O2 Flow Rate (L/min): 2 L/min   Intake/Output Summary (Last 24 hours) at 06/25/2023 0739 Last data filed at 06/25/2023 0600 Gross per 24 hour  Intake 576 ml  Output 2150 ml  Net -1574 ml   Filed Weights   06/21/23 0500 06/23/23 1700 06/24/23 1954  Weight: 76.9 kg 84 kg 86.9 kg    Exam: General exam: In no acute distress. Respiratory system: Good air movement and clear to auscultation. Cardiovascular system: S1 & S2 heard, RRR. No JVD. Gastrointestinal system: Abdomen is nondistended, soft and nontender.  Extremities: No pedal edema. Skin: No rashes, lesions  or ulcers Psychiatry: Judgement and insight appear normal. Mood & affect appropriate.  Data Reviewed:    Labs: Basic Metabolic Panel: Recent Labs  Lab 06/20/23 0503 06/21/23 0548 06/22/23 0518 06/23/23 0524 06/24/23 0654  NA 139 140 138 143 144  K 3.5 3.8 3.9 3.5 3.4*  CL 117* 116* 116* 117* 117*  CO2 15* 13* 10* 13* 14*  GLUCOSE 137* 97 113* 130* 97  BUN 45* 51* 61* 72* 71*  CREATININE 3.30* 4.03* 5.20* 5.69* 5.47*  CALCIUM  6.9* 6.7* 7.0* 8.0* 8.4*  PHOS  --   --   --  6.2* 6.6*   GFR Estimated Creatinine Clearance: 8.8 mL/min (A) (by C-G formula based on SCr of 5.47 mg/dL (H)). Liver Function Tests: Recent Labs  Lab 06/23/23 0524 06/24/23 0654  ALBUMIN 2.1* 2.1*   No results for input(s): LIPASE, AMYLASE in the last  168 hours. No results for input(s): AMMONIA in the last 168 hours. Coagulation profile Recent Labs  Lab 06/22/23 0518  INR 1.4*   COVID-19 Labs  No results for input(s): DDIMER, FERRITIN, LDH, CRP in the last 72 hours.  No results found for: SARSCOV2NAA  CBC: Recent Labs  Lab 06/20/23 0503 06/21/23 0548 06/22/23 0518 06/23/23 0524 06/24/23 0654  WBC 10.1 13.1* 10.9* 10.0 10.8*  HGB 12.5 13.0 13.3 13.2 14.0  HCT 37.6 38.1 40.5 38.7 40.6  MCV 90.6 88.6 92.0 88.8 86.0  PLT 132* 127* 123* 116* 121*   Cardiac Enzymes: Recent Labs  Lab 06/19/23 2019  CKTOTAL 516*   BNP (last 3 results) No results for input(s): PROBNP in the last 8760 hours. CBG: No results for input(s): GLUCAP in the last 168 hours. D-Dimer: No results for input(s): DDIMER in the last 72 hours. Hgb A1c: No results for input(s): HGBA1C in the last 72 hours. Lipid Profile: No results for input(s): CHOL, HDL, LDLCALC, TRIG, CHOLHDL, LDLDIRECT in the last 72 hours. Thyroid  function studies: No results for input(s): TSH, T4TOTAL, T3FREE, THYROIDAB in the last 72 hours.  Invalid input(s): FREET3 Anemia work up: No results for input(s): VITAMINB12, FOLATE, FERRITIN, TIBC, IRON, RETICCTPCT in the last 72 hours. Sepsis Labs: Recent Labs  Lab 06/21/23 0548 06/22/23 0518 06/23/23 0524 06/24/23 0654  WBC 13.1* 10.9* 10.0 10.8*   Microbiology Recent Results (from the past 240 hours)  Urine Culture (for pregnant, neutropenic or urologic patients or patients with an indwelling urinary catheter)     Status: Abnormal   Collection Time: 06/17/23 10:24 PM   Specimen: Urine, Clean Catch  Result Value Ref Range Status   Specimen Description   Final    URINE, CLEAN CATCH Performed at [facility], [address], [place], [place] [address]    Special Requests   Final    NONE Performed at [facility], [address] [place], [place] [address]    Culture >=100,000 COLONIES/mL ESCHERICHIA COLI (A)  Final   Report Status 06/20/2023 FINAL  Final   Organism ID, Bacteria ESCHERICHIA COLI (A)  Final      Susceptibility   Escherichia coli - MIC*    AMPICILLIN <=2 SENSITIVE Sensitive     CEFAZOLIN  <=4 SENSITIVE Sensitive     CEFEPIME <=0.12 SENSITIVE Sensitive     CEFTRIAXONE  <=0.25 SENSITIVE Sensitive     CIPROFLOXACIN <=0.25 SENSITIVE Sensitive     GENTAMICIN <=1 SENSITIVE Sensitive     IMIPENEM <=0.25 SENSITIVE Sensitive     NITROFURANTOIN  <=16 SENSITIVE Sensitive     TRIMETH/SULFA <=20  SENSITIVE Sensitive     AMPICILLIN/SULBACTAM <=2 SENSITIVE Sensitive     PIP/TAZO <=4 SENSITIVE Sensitive ug/mL    * >=100,000 COLONIES/mL ESCHERICHIA COLI     Medications:    Chlorhexidine  Gluconate Cloth  6 each Topical Q0600   docusate sodium   100 mg Oral BID   dorzolamide -timolol   1 drop Both Eyes BID   ezetimibe   10 mg Oral Daily   furosemide   40 mg Intravenous Q12H   latanoprost   1 drop Both Eyes QHS   sodium bicarbonate   1,300 mg Oral TID   Continuous Infusions:  cefTRIAXone  (ROCEPHIN )  IV 1 g (06/24/23 1950)      LOS: 7 days   Marissa Orozco  [facility]  06/25/2023, 7:39 AM

## 2023-06-26 DIAGNOSIS — N179 Acute kidney failure, unspecified: Secondary | ICD-10-CM | POA: Diagnosis not present

## 2023-06-26 DIAGNOSIS — E785 Hyperlipidemia, unspecified: Secondary | ICD-10-CM | POA: Diagnosis not present

## 2023-06-26 LAB — RENAL FUNCTION PANEL
Albumin: 1.9 g/dL — ABNORMAL LOW (ref 3.5–5.0)
Anion gap: 6 (ref 5–15)
BUN: 44 mg/dL — ABNORMAL HIGH (ref 8–23)
CO2: 25 mmol/L (ref 22–32)
Calcium: 7.3 mg/dL — ABNORMAL LOW (ref 8.9–10.3)
Chloride: 106 mmol/L (ref 98–111)
Creatinine, Ser: 3.64 mg/dL — ABNORMAL HIGH (ref 0.44–1.00)
GFR, Estimated: 12 mL/min — ABNORMAL LOW (ref >60)
Glucose, Bld: 100 mg/dL — ABNORMAL HIGH (ref 70–99)
Phosphorus: 4.8 mg/dL — ABNORMAL HIGH (ref 2.5–4.6)
Potassium: 2.5 mmol/L — CL (ref 3.5–5.1)
Sodium: 137 mmol/L (ref 135–145)

## 2023-06-26 LAB — CBC
HCT: 37.1 % (ref 36.0–46.0)
Hemoglobin: 13.3 g/dL (ref 12.0–15.0)
MCH: 29.7 pg (ref 26.0–34.0)
MCHC: 35.8 g/dL (ref 30.0–36.0)
MCV: 82.8 fL (ref 80.0–100.0)
Platelets: 113 10*3/uL — ABNORMAL LOW (ref 150–400)
RBC: 4.48 MIL/uL (ref 3.87–5.11)
RDW: 18.6 % — ABNORMAL HIGH (ref 11.5–15.5)
WBC: 12.3 10*3/uL — ABNORMAL HIGH (ref 4.0–10.5)
nRBC: 0 % (ref 0.0–0.2)

## 2023-06-26 MED ORDER — ORAL CARE MOUTH RINSE: 15.0000 mL | OROMUCOSAL | Status: DC | PRN

## 2023-06-26 MED ORDER — CHLORHEXIDINE GLUCONATE CLOTH 2 % EX PADS
6.0000 | MEDICATED_PAD | Freq: Every day | CUTANEOUS | Status: DC
  Administered 2023-06-28 – 2023-07-04 (×5): 6 via TOPICAL

## 2023-06-26 MED ORDER — POTASSIUM CHLORIDE CRYS ER 20 MEQ PO TBCR
20.0000 meq | EXTENDED_RELEASE_TABLET | Freq: Two times a day (BID) | ORAL | Status: DC
  Administered 2023-06-26 – 2023-06-28 (×6): 20 meq via ORAL
  Filled 2023-06-26 (×6): qty 1

## 2023-06-26 NOTE — Progress Notes (Signed)
 [facility] Progress Note   Assessment/ Plan:   # AKI - Ischemic and prerenal insults - hypotension at home, ARB use, and  UTI.  But with concurrent microscopic hematuria and subnephrotic range proteinuria.  Baseline Cr < 1 - Remain off of home ARB   - ANA + and complement both low - DSDNA, dsDNA, antiSM neg; ANCA P, - SPEP no M-paraprotein, K/L high but from renal failure - needs a biopsy- have ordered, appreciate VIR seeing and biopsy scheduled for Monday 6/23  after aspirin  washout - Feeling worse on 6/21 with renal function going the wrong direction as well, worsening acidosis, nausea, decreasing urine output ` ->  s/p TDC and HD #1 early on 6/21 Plan for next HD on Monday if needed-  was showing signs of recovery of renal function -  eval with labs tomorrow pre HD    # Hypertension - As above, remain off of ARB for now in the setting of AKI - Noted report of hypotension prior to admission - Acceptable on current regimen  - added some lasix  given inc UOP and volume overload with good response   # Proteinuria  - up/cr ratio 2530 mg/g  - GN w/u as above - off of ARB for AKI      # Microscopic hematuria  - Note UTI which may explain - abx per primary team  - GN w/u as above     # UTI - E coli UTI - Ceftriaxone  daily started on 6/15 - will need 1 week of therapy for complicated UTI with hematuria    # Pre-DM - Note A1c 6.2 on 06/02/23 - Optimizing pre-DM can reduce risk of CKD progression   # Angiomyolipoma - Probable angiomyolipoma on renal US  - Radiology recommended a follow-up renal ultrasound in 6 months   # Hypokalemia - replete-  also have stopped the sodium bicarb-  give her some OJ  Subjective:    Good UOP-  total of close to 2 liters-  labs reflective of HD but also maybe some recovery ? She says she feels about the same    Objective:   BP 131/66 (BP Location: Right Arm)   Pulse 66   Temp 97.9 F (36.6 C) (Oral)   Resp 18   Ht 5' 3.5  (1.613 m)   Wt 82.6 kg   SpO2 95%   BMI 31.75 kg/m   Intake/Output Summary (Last 24 hours) at 06/26/2023 1259 Last data filed at 06/26/2023 1248 Gross per 24 hour  Intake 820 ml  Output 1900 ml  Net -1080 ml   Weight change: -4 kg  Physical Exam: Gen:NAD CVS: RRR Resp: clear Jai:dnqu Ext:  1-2+ LE edema  Imaging: No results found.  Labs: BMET Recent Labs  Lab 06/20/23 0503 06/21/23 0548 06/22/23 0518 06/23/23 0524 06/24/23 0654 06/26/23 0533  NA 139 140 138 143 144 137  K 3.5 3.8 3.9 3.5 3.4* 2.5*  CL 117* 116* 116* 117* 117* 106  CO2 15* 13* 10* 13* 14* 25  GLUCOSE 137* 97 113* 130* 97 100*  BUN 45* 51* 61* 72* 71* 44*  CREATININE 3.30* 4.03* 5.20* 5.69* 5.47* 3.64*  CALCIUM  6.9* 6.7* 7.0* 8.0* 8.4* 7.3*  PHOS  --   --   --  6.2* 6.6* 4.8*   CBC Recent Labs  Lab 06/22/23 0518 06/23/23 0524 06/24/23 0654 06/26/23 0533  WBC 10.9* 10.0 10.8* 12.3*  HGB 13.3 13.2 14.0 13.3  HCT 40.5 38.7 40.6 37.1  MCV  92.0 88.8 86.0 82.8  PLT 123* 116* 121* 113*    Medications:     Chlorhexidine  Gluconate Cloth  6 each Topical Q0600   docusate sodium   100 mg Oral BID   dorzolamide -timolol   1 drop Both Eyes BID   ezetimibe   10 mg Oral Daily   furosemide   40 mg Intravenous Q12H   latanoprost   1 drop Both Eyes QHS   sodium bicarbonate   1,300 mg Oral TID    Marissa Orozco [name]  06/26/2023, 12:59 PM

## 2023-06-26 NOTE — Plan of Care (Signed)
   Problem: Clinical Measurements: Goal: Diagnostic test results will improve Outcome: Progressing

## 2023-06-26 NOTE — Progress Notes (Signed)
 [facility] PROGRESS NOTE    Progress Note  Marissa Orozco  FMW:[id number] DOB: 13-Sep-1945 DOA: 06/17/2023 PCP: Jarold Medici, MD     Brief Narrative:   KARLISA GAUBERT is an 78 y.o. female past medical history of CVA, essential hypertension hyperlipidemia history of kidney stones coming to the ED complaining of fatigue was found to have low blood pressure, she relates significant poor intake of less than the days, called her PCP to told her to come to the ED.  Was found to be in acute kidney injury.  Assessment/Plan:   ATN/normal anion gap metabolic acidosis/ uremic: With recent creatinine of less than 1. Her admission Cr was2.7 it continued to climb up to 5.6. Stabilized at 5.4.  Status post HD ARB was held nephrology was consulted.   UA showed white blood cells and protein.  C3 and C4 complements are low, ANA positive as well as p-ANCA The plan for renal biopsy on 06/27/2023 by IR, awaiting aspirin  washout for 5 days.  Concerned about glomerulonephritis. No M spike on SPEP. Status post dialysis catheter placement and first dialysis in 06/25/2023, plan for next dialysis 06/27/2023. Renal panel is pending this morning  Essential  HTN (hypertension), malignant Holding ARB in the setting of ATN. Blood pressure continues to be stable. Continue hydralazine as needed, Lasix  added for volume overload, urine output has picked up  E. coli UTI: Will complete a 7 day course of IV Rocephin .  Hyperlipidemia: Continue Zetia  and Lipitor.  History of CVA: Holding aspirin  and statins.  Hypokalemia: Replete and recheck.  Try to keep potassium greater than 4 magnesium  greater than 2.  DVT prophylaxis: lovenox Family Communication:none Status is: Inpatient Remains inpatient appropriate because: 16    Code Status:     Code Status Orders  (From admission, onward)           Start     Ordered   06/17/23 2120  Full code  Continuous       Question:  By:  Answer:  Consent:  discussion documented in EHR   06/17/23 2120           Code Status History     Date Active Date Inactive Code Status Order ID Comments User Context   09/07/2011 1540 09/08/2011 1413 Full Code [id number]  Schuyler Fermo, PA-C ED         IV Access:   Peripheral IV   Procedures and diagnostic studies:   No results found.   Medical Consultants:   None.   Subjective:    FABIENNE NOLASCO is to be fatigued and tired Objective:    Vitals:   06/25/23 1942 06/26/23 0500 06/26/23 0501 06/26/23 0712  BP: (!) 122/58  122/69 131/66  Pulse: 72  66 66  Resp: 20  18 18   Temp: 98.1 F (36.7 C)  98.2 F (36.8 C) 97.9 F (36.6 C)  TempSrc: Oral  Oral Oral  SpO2: 97%  98% 95%  Weight: 82.9 kg 82.6 kg    Height:       SpO2: 95 % O2 Flow Rate (L/min): 2 L/min   Intake/Output Summary (Last 24 hours) at 06/26/2023 0745 Last data filed at 06/26/2023 0600 Gross per 24 hour  Intake 220 ml  Output 1150 ml  Net -930 ml   Filed Weights   06/24/23 1954 06/25/23 1942 06/26/23 0500  Weight: 86.9 kg 82.9 kg 82.6 kg    Exam: General exam: In no acute distress. Respiratory system: Good air movement and  clear to auscultation. Cardiovascular system: S1 & S2 heard, RRR. No JVD. Gastrointestinal system: Abdomen is nondistended, soft and nontender.  Extremities: No pedal edema. Skin: No rashes, lesions or ulcers Psychiatry: Judgement and insight appear normal. Mood & affect appropriate.  Data Reviewed:    Labs: Basic Metabolic Panel: Recent Labs  Lab 06/20/23 0503 06/21/23 0548 06/22/23 0518 06/23/23 0524 06/24/23 0654  NA 139 140 138 143 144  K 3.5 3.8 3.9 3.5 3.4*  CL 117* 116* 116* 117* 117*  CO2 15* 13* 10* 13* 14*  GLUCOSE 137* 97 113* 130* 97  BUN 45* 51* 61* 72* 71*  CREATININE 3.30* 4.03* 5.20* 5.69* 5.47*  CALCIUM  6.9* 6.7* 7.0* 8.0* 8.4*  PHOS  --   --   --  6.2* 6.6*   GFR Estimated Creatinine Clearance: 8.6 mL/min (A) (by C-G formula based on SCr of 5.47  mg/dL (H)). Liver Function Tests: Recent Labs  Lab 06/23/23 0524 06/24/23 0654  ALBUMIN 2.1* 2.1*   No results for input(s): LIPASE, AMYLASE in the last 168 hours. No results for input(s): AMMONIA in the last 168 hours. Coagulation profile Recent Labs  Lab 06/22/23 0518  INR 1.4*   COVID-19 Labs  No results for input(s): DDIMER, FERRITIN, LDH, CRP in the last 72 hours.  No results found for: SARSCOV2NAA  CBC: Recent Labs  Lab 06/21/23 0548 06/22/23 0518 06/23/23 0524 06/24/23 0654 06/26/23 0533  WBC 13.1* 10.9* 10.0 10.8* 12.3*  HGB 13.0 13.3 13.2 14.0 13.3  HCT 38.1 40.5 38.7 40.6 37.1  MCV 88.6 92.0 88.8 86.0 82.8  PLT 127* 123* 116* 121* 113*   Cardiac Enzymes: Recent Labs  Lab 06/19/23 2019  CKTOTAL 516*   BNP (last 3 results) No results for input(s): PROBNP in the last 8760 hours. CBG: No results for input(s): GLUCAP in the last 168 hours. D-Dimer: No results for input(s): DDIMER in the last 72 hours. Hgb A1c: No results for input(s): HGBA1C in the last 72 hours. Lipid Profile: No results for input(s): CHOL, HDL, LDLCALC, TRIG, CHOLHDL, LDLDIRECT in the last 72 hours. Thyroid  function studies: No results for input(s): TSH, T4TOTAL, T3FREE, THYROIDAB in the last 72 hours.  Invalid input(s): FREET3 Anemia work up: No results for input(s): VITAMINB12, FOLATE, FERRITIN, TIBC, IRON, RETICCTPCT in the last 72 hours. Sepsis Labs: Recent Labs  Lab 06/22/23 0518 06/23/23 0524 06/24/23 0654 06/26/23 0533  WBC 10.9* 10.0 10.8* 12.3*   Microbiology Recent Results (from the past 240 hours)  Urine Culture (for pregnant, neutropenic or urologic patients or patients with an indwelling urinary catheter)     Status: Abnormal   Collection Time: 06/17/23 10:24 PM   Specimen: Urine, Clean Catch  Result Value Ref Range Status   Specimen Description   Final    URINE, CLEAN CATCH Performed at [facility], [address] [place], [place] [address]    Special Requests   Final    NONE Performed at [facility], [address], [place], [place] [address]    Culture >=100,000 COLONIES/mL ESCHERICHIA COLI (A)  Final   Report Status 06/20/2023 FINAL  Final   Organism ID, Bacteria ESCHERICHIA COLI (A)  Final      Susceptibility   Escherichia coli - MIC*    AMPICILLIN <=2 SENSITIVE Sensitive     CEFAZOLIN  <=4 SENSITIVE Sensitive     CEFEPIME <=0.12 SENSITIVE Sensitive     CEFTRIAXONE  <=0.25 SENSITIVE Sensitive     CIPROFLOXACIN <=0.25 SENSITIVE Sensitive  GENTAMICIN <=1 SENSITIVE Sensitive     IMIPENEM <=0.25 SENSITIVE Sensitive     NITROFURANTOIN  <=16 SENSITIVE Sensitive     TRIMETH/SULFA <=20 SENSITIVE Sensitive     AMPICILLIN/SULBACTAM <=2 SENSITIVE Sensitive     PIP/TAZO <=4 SENSITIVE Sensitive ug/mL    * >=100,000 COLONIES/mL ESCHERICHIA COLI     Medications:    Chlorhexidine  Gluconate Cloth  6 each Topical Q0600   docusate sodium   100 mg Oral BID   dorzolamide -timolol   1 drop Both Eyes BID   ezetimibe   10 mg Oral Daily   furosemide   40 mg Intravenous Q12H   latanoprost   1 drop Both Eyes QHS   sodium bicarbonate   1,300 mg Oral TID   Continuous Infusions:      LOS: 8 days   Erle Odell Castor  [facility]  06/26/2023, 7:45 AM

## 2023-06-27 ENCOUNTER — Inpatient Hospital Stay (HOSPITAL_COMMUNITY)

## 2023-06-27 ENCOUNTER — Inpatient Hospital Stay: Admitting: Internal Medicine

## 2023-06-27 DIAGNOSIS — E785 Hyperlipidemia, unspecified: Secondary | ICD-10-CM | POA: Diagnosis not present

## 2023-06-27 DIAGNOSIS — N179 Acute kidney failure, unspecified: Secondary | ICD-10-CM | POA: Diagnosis not present

## 2023-06-27 LAB — RENAL FUNCTION PANEL
Albumin: 1.9 g/dL — ABNORMAL LOW (ref 3.5–5.0)
Anion gap: 11 (ref 5–15)
BUN: 50 mg/dL — ABNORMAL HIGH (ref 8–23)
CO2: 23 mmol/L (ref 22–32)
Calcium: 8.2 mg/dL — ABNORMAL LOW (ref 8.9–10.3)
Chloride: 105 mmol/L (ref 98–111)
Creatinine, Ser: 3.46 mg/dL — ABNORMAL HIGH (ref 0.44–1.00)
GFR, Estimated: 13 mL/min — ABNORMAL LOW (ref >60)
Glucose, Bld: 97 mg/dL (ref 70–99)
Phosphorus: 5.6 mg/dL — ABNORMAL HIGH (ref 2.5–4.6)
Potassium: 2.9 mmol/L — ABNORMAL LOW (ref 3.5–5.1)
Sodium: 139 mmol/L (ref 135–145)

## 2023-06-27 LAB — CBC
HCT: 39 % (ref 36.0–46.0)
Hemoglobin: 13.9 g/dL (ref 12.0–15.0)
MCH: 29.8 pg (ref 26.0–34.0)
MCHC: 35.6 g/dL (ref 30.0–36.0)
MCV: 83.7 fL (ref 80.0–100.0)
Platelets: 107 10*3/uL — ABNORMAL LOW (ref 150–400)
RBC: 4.66 MIL/uL (ref 3.87–5.11)
RDW: 19.2 % — ABNORMAL HIGH (ref 11.5–15.5)
WBC: 14 10*3/uL — ABNORMAL HIGH (ref 4.0–10.5)
nRBC: 0 % (ref 0.0–0.2)

## 2023-06-27 LAB — PROTIME-INR
INR: 1.4 — ABNORMAL HIGH (ref 0.8–1.2)
Prothrombin Time: 17.6 s — ABNORMAL HIGH (ref 11.4–15.2)

## 2023-06-27 MED ORDER — MIDAZOLAM HCL 2 MG/2ML IJ SOLN
INTRAMUSCULAR | Status: AC | PRN
  Administered 2023-06-27 (×2): 1 mg via INTRAVENOUS

## 2023-06-27 MED ORDER — LIDOCAINE HCL (PF) 1 % IJ SOLN
10.0000 mL | Freq: Once | INTRAMUSCULAR | Status: AC
  Administered 2023-06-27: 10 mL via INTRADERMAL

## 2023-06-27 MED ORDER — GELATIN ABSORBABLE 12-7 MM EX MISC
1.0000 | Freq: Once | CUTANEOUS | Status: AC
  Administered 2023-06-27: 1 via TOPICAL

## 2023-06-27 MED ORDER — FENTANYL CITRATE (PF) 100 MCG/2ML IJ SOLN
INTRAMUSCULAR | Status: AC | PRN
  Administered 2023-06-27: 25 ug via INTRAVENOUS

## 2023-06-27 MED ORDER — FENTANYL CITRATE (PF) 100 MCG/2ML IJ SOLN
INTRAMUSCULAR | Status: AC
  Filled 2023-06-27: qty 2

## 2023-06-27 MED ORDER — MIDAZOLAM HCL 2 MG/2ML IJ SOLN
INTRAMUSCULAR | Status: AC
  Filled 2023-06-27: qty 2

## 2023-06-27 NOTE — Progress Notes (Signed)
 Patient ID: Marissa Orozco, female   DOB: 09/16/1945, 78 y.o.   MRN: [id number] S: Feeling better.  Tolerated kidney biopsy without issue. O:BP 112/78   Pulse 67   Temp 97.8 F (36.6 C) (Oral)   Resp (!) 21   Ht 5' 3.5 (1.613 m)   Wt 79.8 kg   SpO2 97%   BMI 30.68 kg/m   Intake/Output Summary (Last 24 hours) at 06/27/2023 1406 Last data filed at 06/27/2023 0900 Gross per 24 hour  Intake 240 ml  Output 2350 ml  Net -2110 ml   Intake/Output: I/O last 3 completed shifts: In: 1060 [P.O.:960; IV Piggyback:100] Out: 3800 [Urine:3800]  Intake/Output this shift:  Total I/O In: -  Out: 450 [Urine:450] Weight change: -1.3 kg Gen: NAD CVS: RRR Resp:CTA Abd: +BS, soft, NT/ND Ext: trace presacral edema  Recent Labs  Lab 06/21/23 0548 06/22/23 0518 06/23/23 0524 06/24/23 0654 06/25/23 0807 06/26/23 0533 06/27/23 0539  NA 140 138 143 144 140 137 139  K 3.8 3.9 3.5 3.4* 2.7* 2.5* 2.9*  CL 116* 116* 117* 117* 107 106 105  CO2 13* 10* 13* 14* 21* 25 23  GLUCOSE 97 113* 130* 97 86 100* 97  BUN 51* 61* 72* 71* 34* 44* 50*  CREATININE 4.03* 5.20* 5.69* 5.47* 3.12* 3.64* 3.46*  ALBUMIN  --   --  2.1* 2.1* 2.1* 1.9* 1.9*  CALCIUM  6.7* 7.0* 8.0* 8.4* 8.0* 7.3* 8.2*  PHOS  --   --  6.2* 6.6* 4.2 4.8* 5.6*   Liver Function Tests: Recent Labs  Lab 06/25/23 0807 06/26/23 0533 06/27/23 0539  ALBUMIN 2.1* 1.9* 1.9*   No results for input(s): LIPASE, AMYLASE in the last 168 hours. No results for input(s): AMMONIA in the last 168 hours. CBC: Recent Labs  Lab 06/23/23 0524 06/24/23 0654 06/25/23 0807 06/26/23 0533 06/27/23 0539  WBC 10.0 10.8* 12.4* 12.3* 14.0*  HGB 13.2 14.0 14.4 13.3 13.9  HCT 38.7 40.6 39.5 37.1 39.0  MCV 88.8 86.0 81.8 82.8 83.7  PLT 116* 121* 111* 113* 107*   Cardiac Enzymes: No results for input(s): CKTOTAL, CKMB, CKMBINDEX, TROPONINI in the last 168 hours. CBG: No results for input(s): GLUCAP in the last 168 hours.  Iron  Studies: No results for input(s): IRON, TIBC, TRANSFERRIN, FERRITIN in the last 72 hours. Studies/Results: US  BIOPSY (KIDNEY) Result Date: 06/27/2023 INDICATION: 78 year old female with history of acute kidney injury. EXAM: ULTRASOUND GUIDED RENAL BIOPSY COMPARISON:  None Available. MEDICATIONS: None. ANESTHESIA/SEDATION: Fentanyl  25 mcg IV; Versed  2 mg IV Total Moderate Sedation time: 12 minutes; The patient was continuously monitored during the procedure by the interventional radiology nurse under my direct supervision. COMPLICATIONS: None immediate. PROCEDURE: Informed written consent was obtained from the patient after a discussion of the risks, benefits and alternatives to treatment. The patient understands and consents the procedure. A timeout was performed prior to the initiation of the procedure. Ultrasound scanning was performed of the bilateral flanks. The inferior pole of the right kidney was selected for biopsy due to location and sonographic window. The procedure was planned. The operative site was prepped and draped in the usual sterile fashion. The overlying soft tissues were anesthetized with 1% lidocaine  with epinephrine. A 17 gauge coaxial introducer needle was advanced into the inferior cortex of the right kidney and 2 core biopsies were obtained under direct ultrasound guidance with an 18 gauge biopsy device. Images were saved for documentation purposes. The biopsy device was removed and Gel-Foam slurry was injected under ultrasound  visualization while removing the coaxial needle. Hemostasis was obtained with manual compression. Post procedural scanning was negative for significant post procedural hemorrhage or additional complication. A dressing was placed. The patient tolerated the procedure well without immediate post procedural complication. IMPRESSION: Technically successful ultrasound guided right renal biopsy. Ester Sides, MD [facility]  [facility] Electronically Signed   By: Ester Sides M.D.   On: 06/27/2023 13:39    Chlorhexidine  Gluconate Cloth  6 each Topical Q0600   Chlorhexidine  Gluconate Cloth  6 each Topical Q0600   docusate sodium   100 mg Oral BID   dorzolamide -timolol   1 drop Both Eyes BID   ezetimibe   10 mg Oral Daily   furosemide   40 mg Intravenous Q12H   latanoprost   1 drop Both Eyes QHS   potassium chloride   20 mEq Oral BID    BMET    Component Value Date/Time   NA 139 06/27/2023 0539   NA 141 06/02/2023 1039   K 2.9 (L) 06/27/2023 0539   CL 105 06/27/2023 0539   CO2 23 06/27/2023 0539   GLUCOSE 97 06/27/2023 0539   BUN 50 (H) 06/27/2023 0539   BUN 15 06/02/2023 1039   CREATININE 3.46 (H) 06/27/2023 0539   CALCIUM  8.2 (L) 06/27/2023 0539   GFRNONAA 13 (L) 06/27/2023 0539   GFRAA 89 10/18/2019 0913   CBC    Component Value Date/Time   WBC 14.0 (H) 06/27/2023 0539   RBC 4.66 06/27/2023 0539   HGB 13.9 06/27/2023 0539   HGB 14.2 02/09/2022 0913   HCT 39.0 06/27/2023 0539   HCT 41.9 02/09/2022 0913   PLT 107 (L) 06/27/2023 0539   PLT 314 02/09/2022 0913   MCV 83.7 06/27/2023 0539   MCV 90 02/09/2022 0913   MCH 29.8 06/27/2023 0539   MCHC 35.6 06/27/2023 0539   RDW 19.2 (H) 06/27/2023 0539   RDW 13.3 02/09/2022 0913   LYMPHSABS 1.5 05/25/2018 1029   MONOABS 0.6 11/05/2011 1534   EOSABS 0.1 05/25/2018 1029   BASOSABS 0.1 05/25/2018 1029    Assessment/ Plan:   # AKI - Ischemic and prerenal insults - hypotension at home, ARB use, and  UTI.  But with concurrent microscopic hematuria and subnephrotic range proteinuria.  Baseline Cr < 1 - Remain off of home ARB    - ANA + and complement both low - DSDNA, dsDNA, antiSM neg; ANCA P, - SPEP no M-paraprotein, K/L high but from renal failure - s/p kidney biopsy 06/27/23- appreciate VIR assistance - Feeling worse on 6/21 with rising BUN/Cr ->  s/p TDC and HD #1 early on 6/21 Plan was for next HD today, however UOP and Scr have  markedly improved over the last 24 hours.  Hold off on HD for now and continue to follow for ongoing renal recovery.      # Hypertension - As above, remain off of ARB for now in the setting of AKI - Noted report of hypotension prior to admission - Acceptable on current regimen  - added some lasix  given inc UOP and volume overload with good response   # Proteinuria  - up/cr ratio 2530 mg/g  - GN w/u as above - off of ARB for AKI      # Microscopic hematuria  - Note UTI which may explain - abx per primary team  - GN w/u as above     # UTI - E coli UTI - Ceftriaxone  daily started on 6/15 - will need 1  week of therapy for complicated UTI with hematuria    # Pre-DM - Note A1c 6.2 on 06/02/23 - Optimizing pre-DM can reduce risk of CKD progression   # Angiomyolipoma - Probable angiomyolipoma on renal US  - Radiology recommended a follow-up renal ultrasound in 6 months   # Hypokalemia - replete-  also have stopped the sodium bicarb-  give her some OJ  Fairy RONAL Sellar, MD [facility]

## 2023-06-27 NOTE — Procedures (Signed)
 Interventional Radiology Procedure Note  Procedure: Ultrasound guided right kidney biopsy  Findings: Please refer to procedural dictation for full description. 18 ga core x2 from right inferior pole.  Gelfoam slurry needle track embolization.  Complications: None immediate  Estimated Blood Loss: < 5 ml  Recommendations: Strict 3 hour bedrest. Follow Pathology results.   Ester Sides, MD

## 2023-06-27 NOTE — TOC Progression Note (Signed)
 Transition of Care [employer]) - Progression Note    Patient Details  Name: Marissa Orozco MRN: [id number] Date of Birth: 10-11-1945  Transition [facility] Care [facility]) CM/SW Contact  Tom-Johnson, Harvest Muskrat, RN Phone Number: 06/27/2023, 4:44 PM  Clinical Narrative:     Patient continues on IV abx for UTI. Underwent Rt Kidney Biopsy today 06/26/33. Nephrology following for ongoing Renal recovery, holding off iHD as of now d/t markedly improved Scr and Urine Output.   Patient not Medically ready for discharge.  CM will continue to follow as patient progresses with care towards discharge.        Expected Discharge Plan and Services                                               Social Determinants of Health (SDOH) Interventions SDOH Screenings   Food Insecurity: No Food Insecurity (06/17/2023)  Housing: Low Risk  (06/17/2023)  Transportation Needs: No Transportation Needs (06/17/2023)  Utilities: Not At Risk (06/17/2023)  Alcohol Screen: Low Risk  (02/12/2023)  Depression (PHQ2-9): Low Risk  (06/02/2023)  Financial Resource Strain: Low Risk  (02/12/2023)  Physical Activity: Sufficiently Active (02/12/2023)  Social Connections: Moderately Isolated (06/17/2023)  Stress: No Stress Concern Present (02/12/2023)  Tobacco Use: Low Risk  (06/17/2023)  Health Literacy: Adequate Health Literacy (09/15/2022)    Readmission Risk Interventions    06/24/2023    1:10 PM  Readmission Risk Prevention Plan  Transportation Screening Complete  PCP or Specialist Appt within 5-7 Days Complete  Home Care Screening Complete  Medication Review (RN CM) Referral to Pharmacy

## 2023-06-27 NOTE — Plan of Care (Signed)
  Problem: Health Behavior/Discharge Planning: Goal: Ability to manage health-related needs will improve Outcome: Progressing   Problem: Clinical Measurements: Goal: Ability to maintain clinical measurements within normal limits will improve Outcome: Progressing Goal: Will remain free from infection Outcome: Progressing Goal: Diagnostic test results will improve Outcome: Progressing Goal: Respiratory complications will improve Outcome: Progressing Goal: Cardiovascular complication will be avoided Outcome: Progressing   Problem: Activity: Goal: Risk for activity intolerance will decrease Outcome: Progressing   Problem: Nutrition: Goal: Adequate nutrition will be maintained Outcome: Progressing   Problem: Coping: Goal: Level of anxiety will decrease Outcome: Progressing   Problem: Elimination: Goal: Will not experience complications related to bowel motility Outcome: Progressing Goal: Will not experience complications related to urinary retention Outcome: Progressing   Problem: Pain Managment: Goal: General experience of comfort will improve and/or be controlled Outcome: Progressing   Problem: Safety: Goal: Ability to remain free from injury will improve Outcome: Progressing   Problem: Skin Integrity: Goal: Risk for impaired skin integrity will decrease Outcome: Progressing   Problem: Urinary Elimination: Goal: Signs and symptoms of infection will decrease Outcome: Progressing   Problem: Education: Goal: Knowledge of disease and its progression will improve Outcome: Progressing   Problem: Health Behavior/Discharge Planning: Goal: Ability to manage health-related needs will improve Outcome: Progressing   Problem: Clinical Measurements: Goal: Complications related to the disease process or treatment will be avoided or minimized Outcome: Progressing Goal: Dialysis access will remain free of complications Outcome: Progressing   Problem: Activity: Goal:  Activity intolerance will improve Outcome: Progressing   Problem: Fluid Volume: Goal: Fluid volume balance will be maintained or improved Outcome: Progressing   Problem: Nutritional: Goal: Ability to make appropriate dietary choices will improve Outcome: Progressing   Problem: Respiratory: Goal: Respiratory symptoms related to disease process will be avoided Outcome: Progressing   Problem: Self-Concept: Goal: Body image disturbance will be avoided or minimized Outcome: Progressing   Problem: Urinary Elimination: Goal: Progression of disease will be identified and treated Outcome: Progressing

## 2023-06-27 NOTE — Progress Notes (Signed)
 [facility] PROGRESS NOTE    Progress Note  [name]  FMW:[id number] DOB: April 02, 1945 DOA: 06/17/2023 PCP: Jarold Medici, MD     Brief Narrative:   Marissa Orozco is an 78 y.o. female past medical history of CVA, essential hypertension hyperlipidemia history of kidney stones coming to the ED complaining of fatigue was found to have low blood pressure, she relates significant poor intake of less than the days, called her PCP to told her to come to the ED.  Was found to be in acute kidney injury.  Assessment/Plan:   ATN/normal anion gap metabolic acidosis/ uremic: With recent creatinine of less than 1. Her admission Cr climb up to 5.6. Status post HD UA showed white blood cells and protein.  C3 and C4 complements are low, ANA positive as well as p-ANCA. No M spike on SPEP. The plan for renal biopsy on 06/27/2023 by IR, awaiting aspirin  washout for 5 days.  Concerned about glomerulonephritis. Status post dialysis catheter placement and first dialysis in 06/25/2023, plan for next dialysis 06/27/2023.  Essential  HTN (hypertension), malignant Holding ARB in the setting of ATN. Blood pressure continues to be stable. Continue hydralazine as needed, Lasix  added for volume overload, urine output has picked up  E. coli UTI: Will complete a 7 day course of IV Rocephin .  Hyperlipidemia: Continue Zetia  and Lipitor.  History of CVA: Holding aspirin  and statins.  Hypokalemia: Replete and recheck.  Try to keep potassium greater than 4 magnesium  greater than 2.  DVT prophylaxis: lovenox Family Communication:none Status is: Inpatient Remains inpatient appropriate because: 43    Code Status:     Code Status Orders  (From admission, onward)           Start     Ordered   06/17/23 2120  Full code  Continuous       Question:  By:  Answer:  Consent: discussion documented in EHR   06/17/23 2120           Code Status History     Date Active Date Inactive Code  Status Order ID Comments User Context   09/07/2011 1540 09/08/2011 1413 Full Code [id number]  Schuyler Fermo, PA-C ED         IV Access:   Peripheral IV   Procedures and diagnostic studies:   No results found.   Medical Consultants:   None.   Subjective:    CALYSSA ZOBRIST continues to feel fatigued and tired Objective:    Vitals:   06/26/23 0712 06/26/23 1750 06/26/23 1951 06/27/23 0434  BP: 131/66 125/65 117/78 126/64  Pulse: 66 70 69 68  Resp: 18 18 20 20   Temp: 97.9 F (36.6 C) 98.2 F (36.8 C) 98.2 F (36.8 C) 98.3 F (36.8 C)  TempSrc: Oral Oral Oral Oral  SpO2: 95% 98% 97% 98%  Weight:   81.6 kg   Height:       SpO2: 98 % O2 Flow Rate (L/min): 2 L/min   Intake/Output Summary (Last 24 hours) at 06/27/2023 0725 Last data filed at 06/27/2023 0600 Gross per 24 hour  Intake 840 ml  Output 2650 ml  Net -1810 ml   Filed Weights   06/25/23 1942 06/26/23 0500 06/26/23 1951  Weight: 82.9 kg 82.6 kg 81.6 kg    Exam: General exam: In no acute distress. Respiratory system: Good air movement and clear to auscultation. Cardiovascular system: S1 & S2 heard, RRR. No JVD. Gastrointestinal system: Abdomen is nondistended, soft and nontender.  Extremities: No pedal edema. Skin: No rashes, lesions or ulcers Psychiatry: Judgement and insight appear normal. Mood & affect appropriate. Data Reviewed:    Labs: Basic Metabolic Panel: Recent Labs  Lab 06/22/23 0518 06/23/23 0524 06/24/23 0654 06/26/23 0533 06/27/23 0539  NA 138 143 144 137 139  K 3.9 3.5 3.4* 2.5* 2.9*  CL 116* 117* 117* 106 105  CO2 10* 13* 14* 25 23  GLUCOSE 113* 130* 97 100* 97  BUN 61* 72* 71* 44* 50*  CREATININE 5.20* 5.69* 5.47* 3.64* 3.46*  CALCIUM  7.0* 8.0* 8.4* 7.3* 8.2*  PHOS  --  6.2* 6.6* 4.8* 5.6*   GFR Estimated Creatinine Clearance: 13.5 mL/min (A) (by C-G formula based on SCr of 3.46 mg/dL (H)). Liver Function Tests: Recent Labs  Lab 06/23/23 0524 06/24/23 0654  06/26/23 0533 06/27/23 0539  ALBUMIN 2.1* 2.1* 1.9* 1.9*   No results for input(s): LIPASE, AMYLASE in the last 168 hours. No results for input(s): AMMONIA in the last 168 hours. Coagulation profile Recent Labs  Lab 06/22/23 0518 06/27/23 0539  INR 1.4* 1.4*   COVID-19 Labs  No results for input(s): DDIMER, FERRITIN, LDH, CRP in the last 72 hours.  No results found for: SARSCOV2NAA  CBC: Recent Labs  Lab 06/22/23 0518 06/23/23 0524 06/24/23 0654 06/26/23 0533 06/27/23 0539  WBC 10.9* 10.0 10.8* 12.3* 14.0*  HGB 13.3 13.2 14.0 13.3 13.9  HCT 40.5 38.7 40.6 37.1 39.0  MCV 92.0 88.8 86.0 82.8 83.7  PLT 123* 116* 121* 113* 107*   Cardiac Enzymes: No results for input(s): CKTOTAL, CKMB, CKMBINDEX, TROPONINI in the last 168 hours.  BNP (last 3 results) No results for input(s): PROBNP in the last 8760 hours. CBG: No results for input(s): GLUCAP in the last 168 hours. D-Dimer: No results for input(s): DDIMER in the last 72 hours. Hgb A1c: No results for input(s): HGBA1C in the last 72 hours. Lipid Profile: No results for input(s): CHOL, HDL, LDLCALC, TRIG, CHOLHDL, LDLDIRECT in the last 72 hours. Thyroid  function studies: No results for input(s): TSH, T4TOTAL, T3FREE, THYROIDAB in the last 72 hours.  Invalid input(s): FREET3 Anemia work up: No results for input(s): VITAMINB12, FOLATE, FERRITIN, TIBC, IRON, RETICCTPCT in the last 72 hours. Sepsis Labs: Recent Labs  Lab 06/23/23 0524 06/24/23 0654 06/26/23 0533 06/27/23 0539  WBC 10.0 10.8* 12.3* 14.0*   Microbiology Recent Results (from the past 240 hours)  Urine Culture (for pregnant, neutropenic or urologic patients or patients with an indwelling urinary catheter)     Status: Abnormal   Collection Time: 06/17/23 10:24 PM   Specimen: Urine, Clean Catch  Result Value Ref Range Status   Specimen Description   Final    URINE, CLEAN  CATCH Performed at [facility], [address], [place], [place] [address]    Special Requests   Final    NONE Performed at [facility], [address] [place], [place] [address]    Culture >=100,000 COLONIES/mL ESCHERICHIA COLI (A)  Final   Report Status 06/20/2023 FINAL  Final   Organism ID, Bacteria ESCHERICHIA COLI (A)  Final      Susceptibility   Escherichia coli - MIC*    AMPICILLIN <=2 SENSITIVE Sensitive     CEFAZOLIN  <=4 SENSITIVE Sensitive     CEFEPIME <=0.12 SENSITIVE Sensitive     CEFTRIAXONE  <=0.25 SENSITIVE Sensitive     CIPROFLOXACIN <=0.25 SENSITIVE Sensitive     GENTAMICIN <=1 SENSITIVE Sensitive     IMIPENEM <=0.25 SENSITIVE  Sensitive     NITROFURANTOIN  <=16 SENSITIVE Sensitive     TRIMETH/SULFA <=20 SENSITIVE Sensitive     AMPICILLIN/SULBACTAM <=2 SENSITIVE Sensitive     PIP/TAZO <=4 SENSITIVE Sensitive ug/mL    * >=100,000 COLONIES/mL ESCHERICHIA COLI     Medications:    Chlorhexidine  Gluconate Cloth  6 each Topical Q0600   Chlorhexidine  Gluconate Cloth  6 each Topical Q0600   docusate sodium   100 mg Oral BID   dorzolamide -timolol   1 drop Both Eyes BID   ezetimibe   10 mg Oral Daily   furosemide   40 mg Intravenous Q12H   latanoprost   1 drop Both Eyes QHS   potassium chloride   20 mEq Oral BID   Continuous Infusions:      LOS: 9 days   Erle Odell Castor  [facility]  06/27/2023, 7:25 AM

## 2023-06-28 DIAGNOSIS — E785 Hyperlipidemia, unspecified: Secondary | ICD-10-CM | POA: Diagnosis not present

## 2023-06-28 DIAGNOSIS — N179 Acute kidney failure, unspecified: Secondary | ICD-10-CM | POA: Diagnosis not present

## 2023-06-28 LAB — RENAL FUNCTION PANEL
Albumin: 1.9 g/dL — ABNORMAL LOW (ref 3.5–5.0)
Anion gap: 11 (ref 5–15)
BUN: 61 mg/dL — ABNORMAL HIGH (ref 8–23)
CO2: 23 mmol/L (ref 22–32)
Calcium: 8.2 mg/dL — ABNORMAL LOW (ref 8.9–10.3)
Chloride: 105 mmol/L (ref 98–111)
Creatinine, Ser: 3.25 mg/dL — ABNORMAL HIGH (ref 0.44–1.00)
GFR, Estimated: 14 mL/min — ABNORMAL LOW (ref >60)
Glucose, Bld: 152 mg/dL — ABNORMAL HIGH (ref 70–99)
Phosphorus: 5 mg/dL — ABNORMAL HIGH (ref 2.5–4.6)
Potassium: 3.1 mmol/L — ABNORMAL LOW (ref 3.5–5.1)
Sodium: 139 mmol/L (ref 135–145)

## 2023-06-28 LAB — CBC
HCT: 38.5 % (ref 36.0–46.0)
Hemoglobin: 13.7 g/dL (ref 12.0–15.0)
MCH: 30 pg (ref 26.0–34.0)
MCHC: 35.6 g/dL (ref 30.0–36.0)
MCV: 84.2 fL (ref 80.0–100.0)
Platelets: 116 10*3/uL — ABNORMAL LOW (ref 150–400)
RBC: 4.57 MIL/uL (ref 3.87–5.11)
RDW: 19.9 % — ABNORMAL HIGH (ref 11.5–15.5)
WBC: 14.4 10*3/uL — ABNORMAL HIGH (ref 4.0–10.5)
nRBC: 0 % (ref 0.0–0.2)

## 2023-06-28 NOTE — Plan of Care (Signed)
  Problem: Clinical Measurements: Goal: Diagnostic test results will improve Outcome: Completed/Met Goal: Respiratory complications will improve Outcome: Completed/Met Goal: Cardiovascular complication will be avoided Outcome: Completed/Met   

## 2023-06-28 NOTE — Progress Notes (Signed)
 Mobility Specialist Progress Note:    06/28/23 0915  Therapy Vitals  Pulse Rate 72  Resp 19  BP 99/68  Patient Position (if appropriate) Lying  Oxygen Therapy  SpO2 98 %  O2 Device Room Air  Mobility  Activity Transferred to/from [facility]  Level of Assistance Moderate assist, patient does 50-74%  Assistive Device Front wheel walker  Distance Ambulated (ft) 6 ft  Activity Response Tolerated fair  Mobility Referral Yes  Mobility visit 1 Mobility  Mobility Specialist Start Time (ACUTE ONLY) [id number]  Mobility Specialist Stop Time (ACUTE ONLY) 0840  Mobility Specialist Time Calculation (min) (ACUTE ONLY) 23 min   Pt received in bed and agreeable w/ encouragement. Wanted to ambulate into BR but unable d/t weakness. Transferred to [facility] w/ modA. Void successful. MS assisted w/ peri care. Pt left in bed with call bell and all needs met. Bed alarm on.  D'Vante Nicholaus Mobility Specialist Please contact via Special educational needs teacher or Rehab office at [phone]

## 2023-06-28 NOTE — Progress Notes (Signed)
 [facility] PROGRESS NOTE    Progress Note  Marissa Orozco  FMW:[id number] DOB: August 06, 1945 DOA: 06/17/2023 PCP: Jarold Medici, MD     Brief Narrative:   Marissa Orozco is an 78 y.o. female past medical history of CVA, essential hypertension hyperlipidemia history of kidney stones coming to the ED complaining of fatigue was found to have low blood pressure, she relates significant poor intake of less than the days, called her PCP to told her to come to the ED.  Was found to be in acute kidney injury.  Assessment/Plan:   ATN/normal anion gap metabolic acidosis/ uremic: Her admission Cr climb up to 5.6. UA showed white blood cells and protein.  C3 and C4 complements are low, ANA positive as well as p-ANCA. No M spike on SPEP. Status post renal biopsy on 06/27/2023 by IR,  Concerned about glomerulonephritis. Status post dialysis catheter placement and first dialysis in 06/25/2023. His urine output improved anywhere from 1.5 to 2.6 L, HD was held.  Follow ongoing renal recovery. Creatinine this morning is 3.2. Further management per renal. PT OT consult.  Essential  HTN (hypertension), malignant Holding ARB in the setting of ATN. Blood pressure continues to be stable. Continue IV Lasix  added for volume overload, urine output has picked up  E. coli UTI: Will complete a 7 day course of IV Rocephin .  Hyperlipidemia: Continue Zetia  and Lipitor.  History of CVA: Holding aspirin  and statins.  Hypokalemia: Replete and recheck.  Try to keep potassium greater than 4 magnesium  greater than 2.  DVT prophylaxis: lovenox Family Communication:none Status is: Inpatient Remains inpatient appropriate because: 33    Code Status:     Code Status Orders  (From admission, onward)           Start     Ordered   06/17/23 2120  Full code  Continuous       Question:  By:  Answer:  Consent: discussion documented in EHR   06/17/23 2120           Code Status History      Date Active Date Inactive Code Status Order ID Comments User Context   09/07/2011 1540 09/08/2011 1413 Full Code [id number]  Schuyler Fermo, PA-C ED         IV Access:   Peripheral IV   Procedures and diagnostic studies:   US  BIOPSY (KIDNEY) Result Date: 06/27/2023 INDICATION: 78 year old female with history of acute kidney injury. EXAM: ULTRASOUND GUIDED RENAL BIOPSY COMPARISON:  None Available. MEDICATIONS: None. ANESTHESIA/SEDATION: Fentanyl  25 mcg IV; Versed  2 mg IV Total Moderate Sedation time: 12 minutes; The patient was continuously monitored during the procedure by the interventional radiology nurse under my direct supervision. COMPLICATIONS: None immediate. PROCEDURE: Informed written consent was obtained from the patient after a discussion of the risks, benefits and alternatives to treatment. The patient understands and consents the procedure. A timeout was performed prior to the initiation of the procedure. Ultrasound scanning was performed of the bilateral flanks. The inferior pole of the right kidney was selected for biopsy due to location and sonographic window. The procedure was planned. The operative site was prepped and draped in the usual sterile fashion. The overlying soft tissues were anesthetized with 1% lidocaine  with epinephrine. A 17 gauge coaxial introducer needle was advanced into the inferior cortex of the right kidney and 2 core biopsies were obtained under direct ultrasound guidance with an 18 gauge biopsy device. Images were saved for documentation purposes. The biopsy device was removed and  Gel-Foam slurry was injected under ultrasound visualization while removing the coaxial needle. Hemostasis was obtained with manual compression. Post procedural scanning was negative for significant post procedural hemorrhage or additional complication. A dressing was placed. The patient tolerated the procedure well without immediate post procedural complication. IMPRESSION: Technically  successful ultrasound guided right renal biopsy. Ester Sides, MD [facility] Radiology Electronically Signed   By: Ester Sides M.D.   On: 06/27/2023 13:39     Medical Consultants:   None.   Subjective:    Marissa Orozco appears tired and fatigued. Objective:    Vitals:   06/27/23 1708 06/27/23 2106 06/28/23 0500 06/28/23 0811  BP: 116/80 121/61 106/80 (!) 93/59  Pulse: 77 78 70 71  Resp: 18 16 20    Temp: 98.6 F (37 C) 98.3 F (36.8 C) 98.4 F (36.9 C) 98.4 F (36.9 C)  TempSrc: Oral Oral Oral   SpO2: 99% 98%  97%  Weight:      Height:       SpO2: 97 % O2 Flow Rate (L/min): 2 L/min   Intake/Output Summary (Last 24 hours) at 06/28/2023 0854 Last data filed at 06/28/2023 0600 Gross per 24 hour  Intake 220 ml  Output 450 ml  Net -230 ml   Filed Weights   06/26/23 0500 06/26/23 1951 06/27/23 0701  Weight: 82.6 kg 81.6 kg 79.8 kg    Exam: General exam: In no acute distress. Respiratory system: Good air movement and clear to auscultation. Cardiovascular system: S1 & S2 heard, RRR. No JVD. Gastrointestinal system: Abdomen is nondistended, soft and nontender.  Extremities: No pedal edema. Skin: No rashes, lesions or ulcers Psychiatry: Judgement and insight appear normal. Mood & affect appropriate. Data Reviewed:    Labs: Basic Metabolic Panel: Recent Labs  Lab 06/24/23 0654 06/25/23 0807 06/26/23 0533 06/27/23 0539 06/28/23 0522  NA 144 140 137 139 139  K 3.4* 2.7* 2.5* 2.9* 3.1*  CL 117* 107 106 105 105  CO2 14* 21* 25 23 23   GLUCOSE 97 86 100* 97 152*  BUN 71* 34* 44* 50* 61*  CREATININE 5.47* 3.12* 3.64* 3.46* 3.25*  CALCIUM  8.4* 8.0* 7.3* 8.2* 8.2*  PHOS 6.6* 4.2 4.8* 5.6* 5.0*   GFR Estimated Creatinine Clearance: 14.2 mL/min (A) (by C-G formula based on SCr of 3.25 mg/dL (H)). Liver Function Tests: Recent Labs  Lab 06/24/23 0654 06/25/23 0807 06/26/23 0533 06/27/23 0539  06/28/23 0522  ALBUMIN 2.1* 2.1* 1.9* 1.9* 1.9*   No results for input(s): LIPASE, AMYLASE in the last 168 hours. No results for input(s): AMMONIA in the last 168 hours. Coagulation profile Recent Labs  Lab 06/22/23 0518 06/27/23 0539  INR 1.4* 1.4*   COVID-19 Labs  No results for input(s): DDIMER, FERRITIN, LDH, CRP in the last 72 hours.  No results found for: SARSCOV2NAA  CBC: Recent Labs  Lab 06/24/23 0654 06/25/23 0807 06/26/23 0533 06/27/23 0539 06/28/23 0522  WBC 10.8* 12.4* 12.3* 14.0* 14.4*  HGB 14.0 14.4 13.3 13.9 13.7  HCT 40.6 39.5 37.1 39.0 38.5  MCV 86.0 81.8 82.8 83.7 84.2  PLT 121* 111* 113* 107* 116*   Cardiac Enzymes: No results for input(s): CKTOTAL, CKMB, CKMBINDEX, TROPONINI in the last 168 hours.  BNP (last 3 results) No results for input(s): PROBNP in the last 8760 hours. CBG: No results for input(s): GLUCAP in the last 168 hours. D-Dimer: No results for input(s): DDIMER in the last 72 hours. Hgb A1c: No results for input(s): HGBA1C  in the last 72 hours. Lipid Profile: No results for input(s): CHOL, HDL, LDLCALC, TRIG, CHOLHDL, LDLDIRECT in the last 72 hours. Thyroid  function studies: No results for input(s): TSH, T4TOTAL, T3FREE, THYROIDAB in the last 72 hours.  Invalid input(s): FREET3 Anemia work up: No results for input(s): VITAMINB12, FOLATE, FERRITIN, TIBC, IRON, RETICCTPCT in the last 72 hours. Sepsis Labs: Recent Labs  Lab 06/25/23 0807 06/26/23 0533 06/27/23 0539 06/28/23 0522  WBC 12.4* 12.3* 14.0* 14.4*   Microbiology No results found for this or any previous visit (from the past 240 hours).    Medications:    Chlorhexidine  Gluconate Cloth  6 each Topical Q0600   Chlorhexidine  Gluconate Cloth  6 each Topical Q0600   docusate sodium   100 mg Oral BID   dorzolamide -timolol   1 drop Both Eyes BID   ezetimibe   10 mg Oral Daily   furosemide   40 mg  Intravenous Q12H   latanoprost   1 drop Both Eyes QHS   potassium chloride   20 mEq Oral BID   Continuous Infusions:      LOS: 10 days   Marissa Orozco  [facility]  06/28/2023, 8:54 AM

## 2023-06-28 NOTE — Evaluation (Signed)
 Physical Therapy Evaluation Patient Details Name: Marissa Orozco MRN: [id number] DOB: Aug 14, 1945 Today's Date: 06/28/2023  History of Present Illness  Marissa Orozco is a 78 y.o. female who presented to ED with complaints of low blood pressure and fatigue. Pt found to have an acute kidney injury. Pt underwent Rt kidney biopsy 6/23. PMH: hx of CVA, HTN, HLD, hx of kidney stones   Clinical Impression  Pt admitted with above. PTA pt was indep without AD, lived with spouse, and attended aerobics classes a the [employer]. Pt now presenting with c/o fatigue and LE heaviness requiring modA for mobility and inability to tolerate ambulation this date. Pt completed 3 step pvt transfers this date but was unable to maintain upright posture and take more than 4 steps. Recommend inpatient rehab program > 3 hrs a day to address above deficits and progress towards indep function as PTA for safe transition home with spouse. Acute PT to follow.        If plan is discharge home, recommend the following: A lot of help with walking and/or transfers;A lot of help with bathing/dressing/bathroom;Assist for transportation;Help with stairs or ramp for entrance   Can travel by private vehicle        Equipment Recommendations Rolling walker (2 wheels) (TBD at next venue)  Recommendations for Other Services  Rehab consult    Functional Status Assessment Patient has had a recent decline in their functional status and demonstrates the ability to make significant improvements in function in a reasonable and predictable amount of time.     Precautions / Restrictions Precautions Precautions: Fall Restrictions Weight Bearing Restrictions Per Provider Order: No      Mobility  Bed Mobility Overal bed mobility: Needs Assistance Bed Mobility: Supine to Sit     Supine to sit: Mod assist     General bed mobility comments: HOB elevated, modA for trunk elevation and to scoot hips to EOB, pt with L lateral bias in  sitting, increased trunk flexion    Transfers Overall transfer level: Needs assistance Equipment used: 1 person hand held assist Transfers: Sit to/from Stand, Bed to chair/wheelchair/BSC Sit to Stand: Mod assist   Step pivot transfers: Mod assist       General transfer comment: increased time, bed elevated, modA to power up, modA to steady during stepping to [facility], back to EOB, then to recliner, 3 step pvt transfers total    Ambulation/Gait               General Gait Details: unable this date due to fatigue  Stairs            Wheelchair Mobility     Tilt Bed    Modified Rankin (Stroke Patients Only)       Balance Overall balance assessment: Needs assistance Sitting-balance support: Feet supported, Bilateral upper extremity supported Sitting balance-Leahy Scale: Poor Sitting balance - Comments: reliant on UE support, increased trunk flexion, unable to don socks this date   Standing balance support: Bilateral upper extremity supported Standing balance-Leahy Scale: Poor Standing balance comment: dependent on external support                             Pertinent Vitals/Pain Pain Assessment Pain Assessment: No/denies pain    Home Living Family/patient expects to be discharged to:: Private residence Living Arrangements: Spouse/significant other Available Help at Discharge: Family;Available 24 hours/day Type of Home: House Home Access: Stairs to enter Entrance Stairs-Rails: Can  reach both Entrance Stairs-Number of Steps: 4 Alternate Level Stairs-Number of Steps: flight Home Layout: Two level Home Equipment: None      Prior Function Prior Level of Function : Independent/Modified Independent;Driving             Mobility Comments: no AD, does aerobics at the [facility] ADLs Comments: indep     Extremity/Trunk Assessment   Upper Extremity Assessment Upper Extremity Assessment: Generalized weakness    Lower Extremity Assessment Lower  Extremity Assessment: Generalized weakness    Cervical / Trunk Assessment Cervical / Trunk Assessment: Normal  Communication   Communication Communication: No apparent difficulties    Cognition Arousal: Alert Behavior During Therapy: Flat affect (depressed spirits)   PT - Cognitive impairments: No apparent impairments                         Following commands: Intact       Cueing Cueing Techniques: Verbal cues     General Comments General comments (skin integrity, edema, etc.): pt assisted to [facility], indep with pericare in sitting with set up.    Exercises     Assessment/Plan    PT Assessment Patient needs continued PT services  PT Problem List Decreased strength;Decreased activity tolerance;Decreased balance;Decreased mobility       PT Treatment Interventions DME instruction;Gait training;Stair training;Functional mobility training;Therapeutic activities;Therapeutic exercise;Balance training    PT Goals (Current goals can be found in the Care Plan section)  Acute Rehab PT Goals Patient Stated Goal: to get strong enough to go home PT Goal Formulation: With patient Time For Goal Achievement: 07/12/23 Potential to Achieve Goals: Good    Frequency Min 3X/week     Co-evaluation               AM-PAC PT 6 Clicks Mobility  Outcome Measure Help needed turning from your back to your side while in a flat bed without using bedrails?: A Lot Help needed moving from lying on your back to sitting on the side of a flat bed without using bedrails?: A Lot Help needed moving to and from a bed to a chair (including a wheelchair)?: A Lot Help needed standing up from a chair using your arms (e.g., wheelchair or bedside chair)?: A Lot Help needed to walk in hospital room?: Total Help needed climbing 3-5 steps with a railing? : Total 6 Click Score: 10    End of Session Equipment Utilized During Treatment: Gait belt Activity Tolerance: Patient limited by  fatigue Patient left: in chair;with call bell/phone within reach;with chair alarm set;with nursing/sitter in room Nurse Communication: Mobility status PT Visit Diagnosis: Unsteadiness on feet (R26.81);Muscle weakness (generalized) (M62.81);Difficulty in walking, not elsewhere classified (R26.2)    Time: 8851-8785 PT Time Calculation (min) (ACUTE ONLY): 26 min   Charges:   PT Evaluation $PT Eval Moderate Complexity: 1 Mod PT Treatments $Therapeutic Activity: 8-22 mins PT General Charges $$ ACUTE PT VISIT: 1 Visit         Norene Ames, PT, DPT Acute Rehabilitation Services Secure chat preferred Office #: [phone]   Norene CHRISTELLA Ames 06/28/2023, 1:17 PM

## 2023-06-28 NOTE — Progress Notes (Signed)
 Patient ID: Marissa Orozco, female   DOB: 26-May-1945, 78 y.o.   MRN: [id number] S: Feels weak and tired and needed assistance to get into a recliner today. O:BP 99/68 (BP Location: Right Arm)   Pulse 72   Temp 98.4 F (36.9 C)   Resp 19   Ht 5' 3.5 (1.613 m)   Wt 79.8 kg   SpO2 98%   BMI 30.68 kg/m   Intake/Output Summary (Last 24 hours) at 06/28/2023 1241 Last data filed at 06/28/2023 0915 Gross per 24 hour  Intake 220 ml  Output 0 ml  Net 220 ml   Intake/Output: I/O last 3 completed shifts: In: 460 [P.O.:460] Out: 1550 [Urine:1550]  Intake/Output this shift:  No intake/output data recorded. Weight change: -1.8 kg Gen: NAd CVS: RRR Resp:CTA Abd: +BS, soft, NT/ND Ext: no edema  Recent Labs  Lab 06/22/23 0518 06/23/23 0524 06/24/23 0654 06/25/23 0807 06/26/23 0533 06/27/23 0539 06/28/23 0522  NA 138 143 144 140 137 139 139  K 3.9 3.5 3.4* 2.7* 2.5* 2.9* 3.1*  CL 116* 117* 117* 107 106 105 105  CO2 10* 13* 14* 21* 25 23 23   GLUCOSE 113* 130* 97 86 100* 97 152*  BUN 61* 72* 71* 34* 44* 50* 61*  CREATININE 5.20* 5.69* 5.47* 3.12* 3.64* 3.46* 3.25*  ALBUMIN  --  2.1* 2.1* 2.1* 1.9* 1.9* 1.9*  CALCIUM  7.0* 8.0* 8.4* 8.0* 7.3* 8.2* 8.2*  PHOS  --  6.2* 6.6* 4.2 4.8* 5.6* 5.0*   Liver Function Tests: Recent Labs  Lab 06/26/23 0533 06/27/23 0539 06/28/23 0522  ALBUMIN 1.9* 1.9* 1.9*   No results for input(s): LIPASE, AMYLASE in the last 168 hours. No results for input(s): AMMONIA in the last 168 hours. CBC: Recent Labs  Lab 06/24/23 0654 06/25/23 0807 06/26/23 0533 06/27/23 0539 06/28/23 0522  WBC 10.8* 12.4* 12.3* 14.0* 14.4*  HGB 14.0 14.4 13.3 13.9 13.7  HCT 40.6 39.5 37.1 39.0 38.5  MCV 86.0 81.8 82.8 83.7 84.2  PLT 121* 111* 113* 107* 116*   Cardiac Enzymes: No results for input(s): CKTOTAL, CKMB, CKMBINDEX, TROPONINI in the last 168 hours. CBG: No results for input(s): GLUCAP in the last 168 hours.  Iron Studies: No results  for input(s): IRON, TIBC, TRANSFERRIN, FERRITIN in the last 72 hours. Studies/Results: US  BIOPSY (KIDNEY) Result Date: 06/27/2023 INDICATION: 78 year old female with history of acute kidney injury. EXAM: ULTRASOUND GUIDED RENAL BIOPSY COMPARISON:  None Available. MEDICATIONS: None. ANESTHESIA/SEDATION: Fentanyl  25 mcg IV; Versed  2 mg IV Total Moderate Sedation time: 12 minutes; The patient was continuously monitored during the procedure by the interventional radiology nurse under my direct supervision. COMPLICATIONS: None immediate. PROCEDURE: Informed written consent was obtained from the patient after a discussion of the risks, benefits and alternatives to treatment. The patient understands and consents the procedure. A timeout was performed prior to the initiation of the procedure. Ultrasound scanning was performed of the bilateral flanks. The inferior pole of the right kidney was selected for biopsy due to location and sonographic window. The procedure was planned. The operative site was prepped and draped in the usual sterile fashion. The overlying soft tissues were anesthetized with 1% lidocaine  with epinephrine. A 17 gauge coaxial introducer needle was advanced into the inferior cortex of the right kidney and 2 core biopsies were obtained under direct ultrasound guidance with an 18 gauge biopsy device. Images were saved for documentation purposes. The biopsy device was removed and Gel-Foam slurry was injected under ultrasound visualization while removing the  coaxial needle. Hemostasis was obtained with manual compression. Post procedural scanning was negative for significant post procedural hemorrhage or additional complication. A dressing was placed. The patient tolerated the procedure well without immediate post procedural complication. IMPRESSION: Technically successful ultrasound guided right renal biopsy. Ester Sides, MD [facility] Radiology  Electronically Signed   By: Ester Sides M.D.   On: 06/27/2023 13:39    Chlorhexidine  Gluconate Cloth  6 each Topical Q0600   Chlorhexidine  Gluconate Cloth  6 each Topical Q0600   docusate sodium   100 mg Oral BID   dorzolamide -timolol   1 drop Both Eyes BID   ezetimibe   10 mg Oral Daily   furosemide   40 mg Intravenous Q12H   latanoprost   1 drop Both Eyes QHS   potassium chloride   20 mEq Oral BID    BMET    Component Value Date/Time   NA 139 06/28/2023 0522   NA 141 06/02/2023 1039   K 3.1 (L) 06/28/2023 0522   CL 105 06/28/2023 0522   CO2 23 06/28/2023 0522   GLUCOSE 152 (H) 06/28/2023 0522   BUN 61 (H) 06/28/2023 0522   BUN 15 06/02/2023 1039   CREATININE 3.25 (H) 06/28/2023 0522   CALCIUM  8.2 (L) 06/28/2023 0522   GFRNONAA 14 (L) 06/28/2023 0522   GFRAA 89 10/18/2019 0913   CBC    Component Value Date/Time   WBC 14.4 (H) 06/28/2023 0522   RBC 4.57 06/28/2023 0522   HGB 13.7 06/28/2023 0522   HGB 14.2 02/09/2022 0913   HCT 38.5 06/28/2023 0522   HCT 41.9 02/09/2022 0913   PLT 116 (L) 06/28/2023 0522   PLT 314 02/09/2022 0913   MCV 84.2 06/28/2023 0522   MCV 90 02/09/2022 0913   MCH 30.0 06/28/2023 0522   MCHC 35.6 06/28/2023 0522   RDW 19.9 (H) 06/28/2023 0522   RDW 13.3 02/09/2022 0913   LYMPHSABS 1.5 05/25/2018 1029   MONOABS 0.6 11/05/2011 1534   EOSABS 0.1 05/25/2018 1029   BASOSABS 0.1 05/25/2018 1029    Assessment/ Plan:   # AKI - Ischemic and prerenal insults - hypotension at home, ARB use, and  UTI.  But with concurrent microscopic hematuria and subnephrotic range proteinuria.  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  Will check PR-3 and MPO assays. - DSDNA, dsDNA, antiSM neg; ANCA P, - SPEP no M-paraprotein, K/L high but from renal failure - s/p kidney biopsy 06/27/23- appreciate VIR assistance and awaiting results. - Feeling worse on 6/21 with rising BUN/Cr ->   s/p TDC and HD #1 early on 6/21 - UOP and Scr have markedly improved over the last 48 hours.  Continue to hold off on HD for now and continue to follow for ongoing renal recovery.      # Hypertension - As above, remain off of ARB for now in the setting of AKI - Noted report of hypotension prior to admission - Acceptable on current regimen  - added some lasix  given inc UOP and volume overload with good response   # Proteinuria  - up/cr ratio 2530 mg/g  - GN w/u as above - off of ARB for AKI      # Microscopic hematuria  - Note UTI which may explain - abx per primary team  - GN w/u as above     # UTI - E coli UTI -  Ceftriaxone  daily started on 6/15 - will need 1 week of therapy for complicated UTI with hematuria    # Pre-DM - Note A1c 6.2 on 06/02/23 - Optimizing pre-DM can reduce risk of CKD progression   # Angiomyolipoma - Probable angiomyolipoma on renal US  - Radiology recommended a follow-up renal ultrasound in 6 months   # Hypokalemia - replete-  also have stopped the sodium bicarb-  continue with po supplementation  # Deconditioning - may need CIR or SNF placement for ongoing PT/OT.  Fairy RONAL Sellar, MD [facility]

## 2023-06-28 NOTE — Progress Notes (Signed)

## 2023-06-29 ENCOUNTER — Inpatient Hospital Stay (HOSPITAL_COMMUNITY)

## 2023-06-29 DIAGNOSIS — Z8673 Personal history of transient ischemic attack (TIA), and cerebral infarction without residual deficits: Secondary | ICD-10-CM | POA: Diagnosis not present

## 2023-06-29 DIAGNOSIS — I10 Essential (primary) hypertension: Secondary | ICD-10-CM | POA: Diagnosis not present

## 2023-06-29 DIAGNOSIS — E785 Hyperlipidemia, unspecified: Secondary | ICD-10-CM | POA: Diagnosis not present

## 2023-06-29 DIAGNOSIS — R7989 Other specified abnormal findings of blood chemistry: Secondary | ICD-10-CM

## 2023-06-29 DIAGNOSIS — R531 Weakness: Secondary | ICD-10-CM

## 2023-06-29 DIAGNOSIS — N179 Acute kidney failure, unspecified: Secondary | ICD-10-CM | POA: Diagnosis not present

## 2023-06-29 LAB — CBC WITH DIFFERENTIAL/PLATELET
Abs Immature Granulocytes: 0.07 10*3/uL (ref 0.00–0.07)
Basophils Absolute: 0.1 10*3/uL (ref 0.0–0.1)
Basophils Relative: 1 %
Eosinophils Absolute: 0.3 10*3/uL (ref 0.0–0.5)
Eosinophils Relative: 2 %
HCT: 40 % (ref 36.0–46.0)
Hemoglobin: 13.8 g/dL (ref 12.0–15.0)
Immature Granulocytes: 1 %
Lymphocytes Relative: 15 %
Lymphs Abs: 2.1 10*3/uL (ref 0.7–4.0)
MCH: 30 pg (ref 26.0–34.0)
MCHC: 34.5 g/dL (ref 30.0–36.0)
MCV: 87 fL (ref 80.0–100.0)
Monocytes Absolute: 0.9 10*3/uL (ref 0.1–1.0)
Monocytes Relative: 6 %
Neutro Abs: 10.5 10*3/uL — ABNORMAL HIGH (ref 1.7–7.7)
Neutrophils Relative %: 75 %
Platelets: 108 10*3/uL — ABNORMAL LOW (ref 150–400)
RBC: 4.6 MIL/uL (ref 3.87–5.11)
RDW: 20.7 % — ABNORMAL HIGH (ref 11.5–15.5)
WBC: 13.9 10*3/uL — ABNORMAL HIGH (ref 4.0–10.5)
nRBC: 0 % (ref 0.0–0.2)

## 2023-06-29 LAB — HEPATIC FUNCTION PANEL
ALT: 448 U/L — ABNORMAL HIGH (ref 0–44)
AST: 927 U/L — ABNORMAL HIGH (ref 15–41)
Albumin: 1.8 g/dL — ABNORMAL LOW (ref 3.5–5.0)
Alkaline Phosphatase: 262 U/L — ABNORMAL HIGH (ref 38–126)
Bilirubin, Direct: 2.1 mg/dL — ABNORMAL HIGH (ref 0.0–0.2)
Indirect Bilirubin: 1.9 mg/dL — ABNORMAL HIGH (ref 0.3–0.9)
Total Bilirubin: 4 mg/dL — ABNORMAL HIGH (ref 0.0–1.2)
Total Protein: 5.5 g/dL — ABNORMAL LOW (ref 6.5–8.1)

## 2023-06-29 LAB — RENAL FUNCTION PANEL
Albumin: 1.7 g/dL — ABNORMAL LOW (ref 3.5–5.0)
Anion gap: 11 (ref 5–15)
BUN: 73 mg/dL — ABNORMAL HIGH (ref 8–23)
CO2: 22 mmol/L (ref 22–32)
Calcium: 8.2 mg/dL — ABNORMAL LOW (ref 8.9–10.3)
Chloride: 106 mmol/L (ref 98–111)
Creatinine, Ser: 3.3 mg/dL — ABNORMAL HIGH (ref 0.44–1.00)
GFR, Estimated: 14 mL/min — ABNORMAL LOW (ref >60)
Glucose, Bld: 133 mg/dL — ABNORMAL HIGH (ref 70–99)
Phosphorus: 5.2 mg/dL — ABNORMAL HIGH (ref 2.5–4.6)
Potassium: 3.2 mmol/L — ABNORMAL LOW (ref 3.5–5.1)
Sodium: 139 mmol/L (ref 135–145)

## 2023-06-29 LAB — MAGNESIUM: Magnesium: 1.9 mg/dL (ref 1.7–2.4)

## 2023-06-29 LAB — MRSA NEXT GEN BY PCR, NASAL: MRSA by PCR Next Gen: NOT DETECTED

## 2023-06-29 MED ORDER — POTASSIUM CHLORIDE CRYS ER 20 MEQ PO TBCR
40.0000 meq | EXTENDED_RELEASE_TABLET | Freq: Two times a day (BID) | ORAL | Status: DC
  Administered 2023-06-29 – 2023-06-30 (×3): 40 meq via ORAL
  Filled 2023-06-29 (×3): qty 2

## 2023-06-29 NOTE — Progress Notes (Signed)
 Patient ID: ASHANTIA AMARAL, female   DOB: 1945-04-06, 78 y.o.   MRN: [id number] S: Still feels weak and tired. O:BP 103/68 (BP Location: Right Arm)   Pulse 71   Temp 98 F (36.7 C)   Resp 18   Ht 5' 3.5 (1.613 m)   Wt 79.8 kg   SpO2 99%   BMI 30.68 kg/m   Intake/Output Summary (Last 24 hours) at 06/29/2023 1131 Last data filed at 06/29/2023 1118 Gross per 24 hour  Intake 438 ml  Output 1250 ml  Net -812 ml   Intake/Output: I/O last 3 completed shifts: In: 458 [P.O.:458] Out: 650 [Urine:650]  Intake/Output this shift:  Total I/O In: 200 [P.O.:200] Out: 600 [Urine:600] Weight change:  Gen: NAD CVS: RRR Resp:CTA Abd: +BS, soft, NT/ND Ext: no edema  Recent Labs  Lab 06/23/23 0524 06/24/23 0654 06/25/23 0807 06/26/23 0533 06/27/23 0539 06/28/23 0522 06/29/23 0451 06/29/23 0940  NA 143 144 140 137 139 139 139  --   K 3.5 3.4* 2.7* 2.5* 2.9* 3.1* 3.2*  --   CL 117* 117* 107 106 105 105 106  --   CO2 13* 14* 21* 25 23 23 22   --   GLUCOSE 130* 97 86 100* 97 152* 133*  --   BUN 72* 71* 34* 44* 50* 61* 73*  --   CREATININE 5.69* 5.47* 3.12* 3.64* 3.46* 3.25* 3.30*  --   ALBUMIN 2.1* 2.1* 2.1* 1.9* 1.9* 1.9* 1.7* 1.8*  CALCIUM  8.0* 8.4* 8.0* 7.3* 8.2* 8.2* 8.2*  --   PHOS 6.2* 6.6* 4.2 4.8* 5.6* 5.0* 5.2*  --   AST  --   --   --   --   --   --   --  927*  ALT  --   --   --   --   --   --   --  448*   Liver Function Tests: Recent Labs  Lab 06/28/23 0522 06/29/23 0451 06/29/23 0940  AST  --   --  927*  ALT  --   --  448*  ALKPHOS  --   --  262*  BILITOT  --   --  4.0*  PROT  --   --  5.5*  ALBUMIN 1.9* 1.7* 1.8*   No results for input(s): LIPASE, AMYLASE in the last 168 hours. No results for input(s): AMMONIA in the last 168 hours. CBC: Recent Labs  Lab 06/25/23 0807 06/26/23 0533 06/27/23 0539 06/28/23 0522 06/29/23 0940  WBC 12.4* 12.3* 14.0* 14.4* 13.9*  NEUTROABS  --   --   --   --  10.5*  HGB 14.4 13.3 13.9 13.7 13.8  HCT 39.5 37.1 39.0  38.5 40.0  MCV 81.8 82.8 83.7 84.2 87.0  PLT 111* 113* 107* 116* 108*   Cardiac Enzymes: No results for input(s): CKTOTAL, CKMB, CKMBINDEX, TROPONINI in the last 168 hours. CBG: No results for input(s): GLUCAP in the last 168 hours.  Iron Studies: No results for input(s): IRON, TIBC, TRANSFERRIN, FERRITIN in the last 72 hours. Studies/Results: US  BIOPSY (KIDNEY) Result Date: 06/27/2023 INDICATION: 78 year old female with history of acute kidney injury. EXAM: ULTRASOUND GUIDED RENAL BIOPSY COMPARISON:  None Available. MEDICATIONS: None. ANESTHESIA/SEDATION: Fentanyl  25 mcg IV; Versed  2 mg IV Total Moderate Sedation time: 12 minutes; The patient was continuously monitored during the procedure by the interventional radiology nurse under my direct supervision. COMPLICATIONS: None immediate. PROCEDURE: Informed written consent was obtained from the patient after a discussion of the risks,  benefits and alternatives to treatment. The patient understands and consents the procedure. A timeout was performed prior to the initiation of the procedure. Ultrasound scanning was performed of the bilateral flanks. The inferior pole of the right kidney was selected for biopsy due to location and sonographic window. The procedure was planned. The operative site was prepped and draped in the usual sterile fashion. The overlying soft tissues were anesthetized with 1% lidocaine  with epinephrine. A 17 gauge coaxial introducer needle was advanced into the inferior cortex of the right kidney and 2 core biopsies were obtained under direct ultrasound guidance with an 18 gauge biopsy device. Images were saved for documentation purposes. The biopsy device was removed and Gel-Foam slurry was injected under ultrasound visualization while removing the coaxial needle. Hemostasis was obtained with manual compression. Post procedural scanning was negative for significant post procedural hemorrhage or additional  complication. A dressing was placed. The patient tolerated the procedure well without immediate post procedural complication. IMPRESSION: Technically successful ultrasound guided right renal biopsy. Ester Sides, MD [facility] Radiology Electronically Signed   By: Ester Sides M.D.   On: 06/27/2023 13:39    Chlorhexidine  Gluconate Cloth  6 each Topical Q0600   Chlorhexidine  Gluconate Cloth  6 each Topical Q0600   docusate sodium   100 mg Oral BID   dorzolamide -timolol   1 drop Both Eyes BID   ezetimibe   10 mg Oral Daily   furosemide   40 mg Intravenous Q12H   latanoprost   1 drop Both Eyes QHS   potassium chloride   40 mEq Oral BID    BMET    Component Value Date/Time   NA 139 06/29/2023 0451   NA 141 06/02/2023 1039   K 3.2 (L) 06/29/2023 0451   CL 106 06/29/2023 0451   CO2 22 06/29/2023 0451   GLUCOSE 133 (H) 06/29/2023 0451   BUN 73 (H) 06/29/2023 0451   BUN 15 06/02/2023 1039   CREATININE 3.30 (H) 06/29/2023 0451   CALCIUM  8.2 (L) 06/29/2023 0451   GFRNONAA 14 (L) 06/29/2023 0451   GFRAA 89 10/18/2019 0913   CBC    Component Value Date/Time   WBC 13.9 (H) 06/29/2023 0940   RBC 4.60 06/29/2023 0940   HGB 13.8 06/29/2023 0940   HGB 14.2 02/09/2022 0913   HCT 40.0 06/29/2023 0940   HCT 41.9 02/09/2022 0913   PLT 108 (L) 06/29/2023 0940   PLT 314 02/09/2022 0913   MCV 87.0 06/29/2023 0940   MCV 90 02/09/2022 0913   MCH 30.0 06/29/2023 0940   MCHC 34.5 06/29/2023 0940   RDW 20.7 (H) 06/29/2023 0940   RDW 13.3 02/09/2022 0913   LYMPHSABS 2.1 06/29/2023 0940   LYMPHSABS 1.5 05/25/2018 1029   MONOABS 0.9 06/29/2023 0940   EOSABS 0.3 06/29/2023 0940   EOSABS 0.1 05/25/2018 1029   BASOSABS 0.1 06/29/2023 0940   BASOSABS 0.1 05/25/2018 1029    Assessment/ Plan:   # AKI - Ischemic and prerenal insults - hypotension at home, ARB use, and  UTI.  But with concurrent microscopic hematuria and subnephrotic range proteinuria.   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  Will check PR-3 and MPO assays. - DSDNA, dsDNA, antiSM neg; ANCA P, - SPEP no M-paraprotein, K/L high but from renal failure - s/p kidney biopsy 06/27/23- which revealed chronic changes, AIN, and some TMA, no immune complex mediated  or crescentic GN.  She denies any new medications prior to admission and does not take any PPI's.  No recent antibiotics.   - Feeling worse on 6/21 with rising BUN/Cr ->  s/p TDC and HD #1 early on 6/21 - UOP and Scr have markedly improved over the last 48 hours.  Continue to hold off on HD for now and continue to follow for ongoing renal recovery.   May need another session of HD tomorrow if BUN continues to climb (without UF)    # Hypertension - As above, remain off of ARB for now in the setting of AKI - Noted report of hypotension prior to admission - Acceptable on current regimen  - added some lasix  given inc UOP and volume overload with good response   # Proteinuria  - up/cr ratio 2530 mg/g  - GN w/u as above - off of ARB for AKI      # Microscopic hematuria  - Note UTI which may explain - abx per primary team  - GN w/u as above     # UTI - E coli UTI - Ceftriaxone  daily started on 6/15 - will need 1 week of therapy for complicated UTI with hematuria    # Pre-DM - Note A1c 6.2 on 06/02/23 - Optimizing pre-DM can reduce risk of CKD progression   # Angiomyolipoma - Probable angiomyolipoma on renal US  - Radiology recommended a follow-up renal ultrasound in 6 months   # Hypokalemia - replete-  also have stopped the sodium bicarb-  continue with po supplementation   # Deconditioning - may need CIR or SNF placement for ongoing PT/OT.  Fairy RONAL Sellar, MD [facility]

## 2023-06-29 NOTE — Plan of Care (Signed)
   Problem: Health Behavior/Discharge Planning: Goal: Ability to manage health-related needs will improve Outcome: Progressing

## 2023-06-29 NOTE — Progress Notes (Signed)
   Inpatient Rehabilitation Admissions Coordinator   I have attempted to reach patient by phone to discuss rehab venue options. I will follow up.  Heron Leavell, RN, MSN Rehab Admissions Coordinator [phone] 06/29/2023 4:59 PM

## 2023-06-29 NOTE — Care Management Important Message (Signed)
 Important Message  Patient Details  Name: Marissa Orozco MRN: [id number] Date of Birth: February 21, 1945   Important Message Given:  Yes - Medicare IM     Claretta Deed 06/29/2023, 4:07 PM

## 2023-06-29 NOTE — Progress Notes (Signed)
 PROGRESS NOTE    Marissa Orozco  FMW:[id number] DOB: May 23, 1945 DOA: 06/17/2023 PCP: Jarold Medici, MD   Brief Narrative:  Marissa Orozco is an 78 y.o. female past medical history of CVA, essential hypertension hyperlipidemia history of kidney stones coming to the ED complaining of fatigue was found to have low blood pressure, she relates significant poor intake of less than the days, called her PCP to told her to come to the ED.  Was found to be in acute kidney injury and currently undergoing further renal workup.  Had to have a dialysis catheter placed and dialyzed on 06/25/2003.  Concern for glomerulonephritis however per Nephrology, renal biopsy done and revealed chronic changes, AIN and some TMA but no immune complex mediated or crescentic glomerular nephritis noted.  Appears significantly fatigued and deconditioned so PT OT are recommending SNF.  Incidentally also noted to have abnormal LFTs so we will do further workup for this as well and obtain RUQ U/S and Acute Hepatitis Panel.   Assessment and Plan:  ATN/ Normal Anion Gap Metabolic Acidosis/ Uremic: Hyperphosphatemia -BUN/Cr Trend: Recent Labs  Lab 06/23/23 0524 06/24/23 0654 06/25/23 0807 06/26/23 0533 06/27/23 0539 06/28/23 0522 06/29/23 0451  BUN 72* 71* 34* 44* 50* 61* 73*  CREATININE 5.69* 5.47* 3.12* 3.64* 3.46* 3.25* 3.30*  -U/A showed white blood cells and protein.  C3 and C4 complements are low, ANA positive as well as p-ANCA; No M spike on SPEP. -Phos Level went from 5.0 -> 5.2 -Avoid Nephrotoxic Medications, Contrast Dyes, Hypotension and Dehydration to Ensure Adequate Renal Perfusion and will need to Renally Adjust Meds -Status post renal biopsy on 06/27/2023 by IR as there is concern for Glomerulonephritis.  No biopsy revealed chronic changes, AIN, and some TMA but no immune complex mediated and crescentic glomerulonephritis -Status post dialysis catheter placement and first dialysis in 06/25/2023. Her urine  output improved anywhere from 1.5 to 2.6 L, HD was held.  Follow ongoing renal recovery. -Continue to Monitor and Trend Renal Function carefully and repeat CMP in the AM  -Further Management per Nephrology further recommendation if feel that she may need another session of HD tomorrow if her BUN continues to climb but they would do it without ultrafiltration -PT/OT consulted and recommending SNF given her deconditioning  Proteinuria: Urine protein/creatinine ratio was 2530 and currently getting workup as above.  She is off of ARB given her AKI and nephrology following closely  Microscopic Hematuria: Likely in the setting of UTI.  Antibiotics have now been discontinued.  She has had renal workup as above   Essential HTN (Hypertension): Holding ARB in the setting of ATN. Blood pressure continues to be stable and now on the softer side. Continue IV Lasix  40 mg BID for volume overload as urine output has picked up. CTM BP per Protcol. Last BP reading was 103/68   Abnormal LFTs / Hyperbilirubinemia: AST is now 927 and ALT is 448. T Bili is now 4.0 (1.9 Indirect, 2.1 Direct). Will D/C Acetaminophen  given Abnormal LFTs. Check RUQ U/S and Acute Hepatitis Panel. CTM and Trend Hepatic Fxn Panel and repeat CMP in the AM   Pre-Diabetes: Recent hemoglobin A1c was 6.2 on 06/02/2023.  CTM CBGs per protocol.    E. coli UTI: Completed a 7 day course of IV Ceftriaxone . WBC went from 14.4 -> 13.9. ? Reactive Leukocytosis. CTM and Tren and repeat CBC in the AM.    Hyperlipidemia: C/w Ezetimibe  10 mg po Daily. Currently holding Atorvastatin  80 mg po Daily given  Abnormal LFTs   History of CVA: Holding Aspirin  81 mg po daily and Atorvastatin  80 mg po Daily.  Glaucoma: C/w Dorzolamide -Timolol  2-0.5% ophthalmic solution 1 drop both Eyes BID and Latanoprost  1 drop Both Eyes qHS   Hypokalemia: K+ went from 2.9 -> 3.1 -> 3.2. Increase K+ repletion from po KCL 20 mEQ BID to 40 mEQ BID. CTM and Trend and Replete as  Necessary. Goal K+ > 4.0 and Mag > 2.0. Repeat CMP and Mag Level in the AM  Thrombocytopenia: Platelet Count went from 113 -> 107 -> 116 -> 108. CTM and for S/Sx of Bleeding; No overt bleeding noted. Repeat CBC in the AM   Hypoalbuminemia: Patient's Albumin Level went from 2.1 -> 1.9 -> 1.7 -> 1.8. CTM and Trend and repeat CMP in the AM  Class I Obesity: Complicates overall prognosis and care. Estimated body mass index is 30.68 kg/m as calculated from the following:   Height as of this encounter: 5' 3.5 (1.613 m).   Weight as of this encounter: 79.8 kg. Weight Loss and Dietary Counseling given   DVT prophylaxis: Place and maintain sequential compression device Start: 06/20/23 1531    Code Status: Full Code Family Communication: No family present @ bedside   Disposition Plan:  Level of care: Telemetry Medical Status is: Inpatient Remains inpatient appropriate because: Needs further clinical improvement and clearance by the specialist.   Consultants:  Nephrology IR  Procedures:  As delineated above; Renal Biopsy done  Antimicrobials:  Anti-infectives (From admission, onward)    Start     Dose/Rate Route Frequency Ordered Stop   06/24/23 1430  ceFAZolin  (ANCEF ) IVPB 2g/100 mL premix        2 g 200 mL/hr over 30 Minutes Intravenous  Once 06/24/23 1333 06/25/23 0743   06/24/23 1415  ceFAZolin  (ANCEF ) IVPB 2g/100 mL premix  Status:  Discontinued        2 g 200 mL/hr over 30 Minutes Intravenous  Once 06/24/23 1327 06/24/23 1333   06/19/23 2000  cefTRIAXone  (ROCEPHIN ) 1 g in sodium chloride  0.9 % 100 mL IVPB        1 g 200 mL/hr over 30 Minutes Intravenous Every 24 hours 06/19/23 1928 06/26/23 0748       Subjective: Seen and examined at bedside and continues to be extremely fatigued and tired.  No nausea or vomiting.  Feels okay.  Just feels very weak as well.  No other concerns or complaints at this time.  Objective: Vitals:   06/28/23 1727 06/28/23 2019 06/29/23 0505  06/29/23 0815  BP: 104/64 104/63 106/61 103/68  Pulse: 74 78 66 71  Resp: 18 20 18 18   Temp: 98 F (36.7 C) (!) 97.5 F (36.4 C) 98.3 F (36.8 C) 98 F (36.7 C)  TempSrc:  Oral Oral   SpO2: 100% 99% 98% 99%  Weight:      Height:        Intake/Output Summary (Last 24 hours) at 06/29/2023 1515 Last data filed at 06/29/2023 1341 Gross per 24 hour  Intake 620 ml  Output 1450 ml  Net -830 ml   Filed Weights   06/26/23 0500 06/26/23 1951 06/27/23 0701  Weight: 82.6 kg 81.6 kg 79.8 kg   Examination: Physical Exam:  Constitutional: WN/WD obese AAF in no acute distress appears lethargic and tired Respiratory: Diminished to auscultation bilaterally, no wheezing, rales, rhonchi or crackles. Normal respiratory effort and patient is not tachypenic. No accessory muscle use.  Unlabored breathing Cardiovascular: RRR, no murmurs /  rubs / gallops. S1 and S2 auscultated.  Mild extremity edema Abdomen: Soft, non-tender, distended secondary to body habitus. Bowel sounds positive.  GU: Deferred. Musculoskeletal: No clubbing / cyanosis of digits/nails. No joint deformity upper and lower extremities.  Skin: No rashes, lesions, ulcers on limited skin evaluation. No induration; Warm and dry.  Neurologic: CN 2-12 grossly intact with no focal deficits. Romberg sign and cerebellar reflexes not assessed.  Psychiatric: Normal judgment and insight. Alert and oriented x 3. Normal mood and appropriate affect.   Data Reviewed: I have personally reviewed following labs and imaging studies  CBC: Recent Labs  Lab 06/25/23 0807 06/26/23 0533 06/27/23 0539 06/28/23 0522 06/29/23 0940  WBC 12.4* 12.3* 14.0* 14.4* 13.9*  NEUTROABS  --   --   --   --  10.5*  HGB 14.4 13.3 13.9 13.7 13.8  HCT 39.5 37.1 39.0 38.5 40.0  MCV 81.8 82.8 83.7 84.2 87.0  PLT 111* 113* 107* 116* 108*   Basic Metabolic Panel: Recent Labs  Lab 06/25/23 0807 06/26/23 0533 06/27/23 0539 06/28/23 0522 06/29/23 0451  06/29/23 0940  NA 140 137 139 139 139  --   K 2.7* 2.5* 2.9* 3.1* 3.2*  --   CL 107 106 105 105 106  --   CO2 21* 25 23 23 22   --   GLUCOSE 86 100* 97 152* 133*  --   BUN 34* 44* 50* 61* 73*  --   CREATININE 3.12* 3.64* 3.46* 3.25* 3.30*  --   CALCIUM  8.0* 7.3* 8.2* 8.2* 8.2*  --   MG  --   --   --   --   --  1.9  PHOS 4.2 4.8* 5.6* 5.0* 5.2*  --    GFR: Estimated Creatinine Clearance: 14 mL/min (A) (by C-G formula based on SCr of 3.3 mg/dL (H)). Liver Function Tests: Recent Labs  Lab 06/26/23 0533 06/27/23 0539 06/28/23 0522 06/29/23 0451 06/29/23 0940  AST  --   --   --   --  927*  ALT  --   --   --   --  448*  ALKPHOS  --   --   --   --  262*  BILITOT  --   --   --   --  4.0*  PROT  --   --   --   --  5.5*  ALBUMIN 1.9* 1.9* 1.9* 1.7* 1.8*   No results for input(s): LIPASE, AMYLASE in the last 168 hours. No results for input(s): AMMONIA in the last 168 hours. Coagulation Profile: Recent Labs  Lab 06/27/23 0539  INR 1.4*   Cardiac Enzymes: No results for input(s): CKTOTAL, CKMB, CKMBINDEX, TROPONINI in the last 168 hours. BNP (last 3 results) No results for input(s): PROBNP in the last 8760 hours. HbA1C: No results for input(s): HGBA1C in the last 72 hours. CBG: No results for input(s): GLUCAP in the last 168 hours. Lipid Profile: No results for input(s): CHOL, HDL, LDLCALC, TRIG, CHOLHDL, LDLDIRECT in the last 72 hours. Thyroid  Function Tests: No results for input(s): TSH, T4TOTAL, FREET4, T3FREE, THYROIDAB in the last 72 hours. Anemia Panel: No results for input(s): VITAMINB12, FOLATE, FERRITIN, TIBC, IRON, RETICCTPCT in the last 72 hours. Sepsis Labs: No results for input(s): PROCALCITON, LATICACIDVEN in the last 168 hours.  Recent Results (from the past 240 hours)  MRSA Next Gen by PCR, Nasal     Status: None   Collection Time: 06/29/23  8:21 AM   Specimen: Nasal Mucosa; Nasal  Swab  Result Value  Ref Range Status   MRSA by PCR Next Gen NOT DETECTED NOT DETECTED Final    Comment: (NOTE) The GeneXpert MRSA Assay (FDA approved for NASAL specimens only), is one component of a comprehensive MRSA colonization surveillance program. It is not intended to diagnose MRSA infection nor to guide or monitor treatment for MRSA infections. Test performance is not FDA approved in patients less than 22 years old. Performed at [facility], [address], [place], [place] [address]     Radiology Studies: No results found.  Scheduled Meds:  Chlorhexidine  Gluconate Cloth  6 each Topical Q0600   Chlorhexidine  Gluconate Cloth  6 each Topical Q0600   docusate sodium   100 mg Oral BID   dorzolamide -timolol   1 drop Both Eyes BID   ezetimibe   10 mg Oral Daily   furosemide   40 mg Intravenous Q12H   latanoprost   1 drop Both Eyes QHS   potassium chloride   40 mEq Oral BID   Continuous Infusions:   LOS: 11 days   Alejandro Marker, DO [facility] Available via Epic secure chat 7am-7pm After these hours, please refer to coverage provider listed on [email or web address] 06/29/2023, 3:15 PM

## 2023-06-29 NOTE — TOC Progression Note (Signed)
 Transition of Care [facility]) - Progression Note    Patient Details  Name: Marissa Orozco MRN: [id number] Date of Birth: [birth year]  Transition of [facility]) CM/SW Contact  Tom-Johnson, Kalai Baca Daphne, RN Phone Number: 06/29/2023, 3:17 PM  Clinical Narrative:     CIR recommended, following for possible admit.   CM will continue to follow as patient progresses with care towards discharge.           Expected Discharge Plan and Services                                               Social Determinants of Health (SDOH) Interventions SDOH Screenings   Food Insecurity: No Food Insecurity (06/17/2023)  Housing: Low Risk  (06/17/2023)  Transportation Needs: No Transportation Needs (06/17/2023)  Utilities: Not At Risk (06/17/2023)  Alcohol Screen: Low Risk  (02/12/2023)  Depression (PHQ2-9): Low Risk  (06/02/2023)  Financial Resource Strain: Low Risk  (02/12/2023)  Physical Activity: Sufficiently Active (02/12/2023)  Social Connections: Moderately Isolated (06/17/2023)  Stress: No Stress Concern Present (02/12/2023)  Tobacco Use: Low Risk  (06/17/2023)  Health Literacy: Adequate Health Literacy (09/15/2022)    Readmission Risk Interventions    06/24/2023    1:10 PM  Readmission Risk Prevention Plan  Transportation Screening Complete  PCP or Specialist Appt within 5-7 Days Complete  Home Care Screening Complete  Medication Review (RN CM) Referral to Pharmacy

## 2023-06-29 NOTE — Hospital Course (Addendum)
 Marissa Orozco is an 78 y.o. female past medical history of CVA, essential hypertension hyperlipidemia history of kidney stones coming to the ED complaining of fatigue was found to have low blood pressure, she relates significant poor intake of less than the days, called her PCP to told her to come to the ED.  Was found to be in acute kidney injury and underwent extensive renal workup.  Had to have a dialysis catheter placed and dialyzed on 06/25/2023.  Concern for glomerulonephritis however per Nephrology, renal biopsy done and revealed chronic changes, AIN and some TMA but no immune complex mediated or crescentic glomerular nephritis noted.  Appears significantly fatigued and deconditioned so PT/OT are recommending CIR once medically stable.  Incidentally also noted to have abnormal LFTs so we will do further workup for this as well and obtain RUQ U/S which showed Fatty Liver Disease and Acute Hepatitis Panel Negative.  Pursuing further hepatic workup after discussion with GI will be obtaining liver biopsy since enzymes are stagnant; MRCP done and several labs done concerning for autoimmune hepatitis so GI adding additional labs.   Patient underwent liver biopsy on the afternoon of 07/04/23 she had 3 cores in the right hepatic lobe. Still awaiting Bx results.   Assessment and Plan:  ATN/ Normal Anion Gap Metabolic Acidosis/ Uremic: Hyperphosphatemia -BUN/Cr Trend: Recent Labs  Lab 06/28/23 0522 06/29/23 0451 06/30/23 0543 07/01/23 9347 07/02/23 0712 07/03/23 0807 07/04/23 0547  BUN 61* 73* 76* 72* 69* 57* 58*  CREATININE 3.25* 3.30* 2.66* 2.45* 2.00* 2.09* 1.94*  -U/A showed white blood cells and protein.  C3 and C4 complements are low, ANA positive as well as p-ANCA 1:160; No M spike on SPEP. -Phos Level went from 5.0 -> 5.2 -> 5.6 -> 4.3 -> 4.5 -> 4.7 -> 4.8 -Has a slight metabolic acidosis with a CO2 of 21, anion gap of 9, chloride level 150 -Avoid Nephrotoxic Medications, Contrast  Dyes, Hypotension and Dehydration to Ensure Adequate Renal Perfusion and will need to Renally Adjust Meds -Status post renal biopsy on 06/27/2023 by IR as there is concern for Glomerulonephritis.  No biopsy revealed chronic changes, AIN, and some TMA but no immune complex mediated and crescentic glomerulonephritis -Status post dialysis catheter placement and first dialysis in 06/25/2023. Follow ongoing renal recovery. -Continue to Monitor and Trend Renal Function carefully and repeat CMP in the AM  -Further Management per Nephrology; Renal Fxn is improving  -PT/OT consulted and recommending CIR but will hold discharging her to CIR on 6/29 given further evaluation by GI.  Nephrology recommends to discontinue TDC now  Proteinuria: Urine protein/creatinine ratio was 2530 and currently getting workup as above.  She is off of ARB given her AKI; Nephrology now signed off   Microscopic Hematuria: Likely in the setting of UTI.  Antibiotics have now been discontinued.  She has had renal workup as above   Essential HTN (Hypertension): Holding ARB in the setting of ATN. Blood pressure continues to be stable and now on the softer side. IV Lasix  40 mg BID changed po Furosemide  40 mg po BID. CTM BP per Protcol. Last BP reading was 121/64   Abnormal LFTs / Hyperbilirubinemia: AST is now 927 -> 897 -> 855 -> 677 -> 676 -> 689 and ALT is 448 -> 506 -> 520 -> 451 -> 456 -> 446. T Bili is now 4.0 (1.9 Indirect, 2.1 Direct) -> 3.9 -> 4.1 -> 3.5 -> 4.3 on last check. D/C'd Acetaminophen  given Abnormal LFTs. Checked RUQ U/S and  showed Fatty Liver Disease. Acute Hepatitis Panel Negative. CTM and Trend Hepatic Fxn Panel and repeat CMP in the AM. D/w GI informally and they are recommending checking an IgG level (2038), anti-smooth muscle antibody +, CMV IgM <30.0 and IgG >10.00, Epstein-Barr IgM 56.0, IgG >600, and HSV non-reactive as well as getting a noncontrast MRCP. MRCP done and showed some fusiform dilatation of the common  bile duct to 1 cm some intrahepatic dilatation the left side as well as incidentally noting a 5.2 cm x 4.6 cm pancreatic cyst which will need further evaluation by EUS and GI feels that this can be done in an outpatient setting.  Nephrology is checking iron studies to evaluate for hemochromatosis.   -GI formally consulted and discussing with advanced endoscopy colleagues to see what they think about pursuing an ERCP and they do not think that she has a biliary obstruction causing his part of enzyme abnormality and have recommended against ERCP at this time. GI recommends continuing trending LFTs and since LFT trend did not improve she underwent a liver biopsy on 07/04/2023 with pending results.  Will continue to monitor and GI is also checking a additional laboratory data including antimitochondrial antibody, IgG4 and inflammatory markers - GI feels that this could be shock liver versus autoimmune hepatitis versus infectious and awaiting biopsy results.  Pre-Diabetes: Recent hemoglobin A1c was 6.2 on 06/02/2023.  CTM CBGs per protocol.  Blood Sugars ranging from 85-181 on daily BMP/CMP's.   E. coli UTI: Completed a 7 day course of IV Ceftriaxone .? Reactive Leukocytosis given fluctuations but now resolved and WBC is 10.0. CTM and Trend and repeat CBC in the AM.    Hyperlipidemia: Discontinue Ezetimibe  10 mg po Daily. Currently holding Atorvastatin  80 mg po Daily given Abnormal LFTs   History of CVA: Holding Aspirin  81 mg po daily and Atorvastatin  80 mg po Daily.  Glaucoma: C/w Dorzolamide -Timolol  2-0.5% ophthalmic solution 1 drop both Eyes BID and Latanoprost  1 drop Both Eyes qHS   Hypokalemia: K+ is now 4.0. Hold and D/C po KCL 20 mEQ BID for now. CTM and Trend and Replete as Necessary. Goal K+ > 4.0 and Mag > 2.0. Repeat CMP and Mag Level in the AM  Thrombocytopenia: Improved. Platelet Count went from 107 at its lowest and is now 155. CTM and for S/Sx of Bleeding; No overt bleeding noted. Repeat CBC  in the AM   Hypoalbuminemia: Patient's Albumin Level Trending down and is now 1.6. CTM and Trend and repeat CMP in the AM  Class I Obesity: Complicates overall prognosis and care. Estimated body mass index is 30.33 kg/m as calculated from the following:   Height as of this encounter: 5' 3.5 (1.613 m).   Weight as of this encounter: 78.9 kg. Weight Loss and Dietary Counseling given

## 2023-06-29 NOTE — Evaluation (Signed)
 Occupational Therapy Evaluation Patient Details Name: Marissa Orozco MRN: [id number] DOB: 07-Oct-1945 Today's Date: 06/29/2023   History of Present Illness   Marissa Orozco is a 78 y.o. female who presented to ED with complaints of low blood pressure and fatigue. Pt found to have an acute kidney injury. Pt underwent Rt kidney biopsy 6/23. PMH: hx of CVA, HTN, HLD, hx of kidney stones     Clinical Impressions Pt was independent prior to admission and active at the [facility]. Presents with generalized weakness, decreased activity tolerance and impaired standing balance. Pt requires moderate assist from supine to sit. From elevated bed she stood and transferred with RW with min assist, use of momentum and increased time. Pt requires set up to total assist for ADLs. Pt will need intensive rehab when medically stable to facilitate return home with her supportive husband. Patient will benefit from intensive inpatient follow-up therapy, >3 hours/day.     If plan is discharge home, recommend the following:   Two people to help with walking and/or transfers;A lot of help with bathing/dressing/bathroom;Assistance with cooking/housework;Assist for transportation;Help with stairs or ramp for entrance     Functional Status Assessment   Patient has had a recent decline in their functional status and demonstrates the ability to make significant improvements in function in a reasonable and predictable amount of time.     Equipment Recommendations   BSC/3in1     Recommendations for Other Services   Rehab consult     Precautions/Restrictions   Precautions Precautions: Fall Recall of Precautions/Restrictions: Intact Restrictions Weight Bearing Restrictions Per Provider Order: No     Mobility Bed Mobility Overal bed mobility: Needs Assistance Bed Mobility: Supine to Sit     Supine to sit: Mod assist     General bed mobility comments: HOB elevated, assist for LEs and hips to EOB     Transfers Overall transfer level: Needs assistance Equipment used: Rolling walker (2 wheels) Transfers: Sit to/from Stand, Bed to chair/wheelchair/BSC Sit to Stand: From elevated surface, Min assist     Step pivot transfers: Min assist     General transfer comment: elevated bed, use of momentum, assist to rise and steady, cues for hand placement, increased time      Balance Overall balance assessment: Needs assistance   Sitting balance-Leahy Scale: Fair Sitting balance - Comments: statically   Standing balance support: Bilateral upper extremity supported Standing balance-Leahy Scale: Poor Standing balance comment: min assist and B UEs on RW                           ADL either performed or assessed with clinical judgement   ADL Overall ADL's : Needs assistance/impaired Eating/Feeding: Independent;Sitting   Grooming: Set up;Sitting   Upper Body Bathing: Minimal assistance;Sitting   Lower Body Bathing: Total assistance;Sit to/from stand   Upper Body Dressing : Minimal assistance;Sitting   Lower Body Dressing: Total assistance;Sit to/from stand   Toilet Transfer: Minimal assistance;Stand-pivot;Rolling walker (2 wheels)                   Vision Baseline Vision/History: 0 No visual deficits Patient Visual Report: No change from baseline       Perception         Praxis         Pertinent Vitals/Pain Pain Assessment Pain Assessment: No/denies pain     Extremity/Trunk Assessment Upper Extremity Assessment Upper Extremity Assessment: Generalized weakness;Left hand dominant   Lower Extremity  Assessment Lower Extremity Assessment: Defer to PT evaluation   Cervical / Trunk Assessment Cervical / Trunk Assessment: Normal   Communication Communication Communication: No apparent difficulties   Cognition Arousal: Alert Behavior During Therapy: Flat affect Cognition: No apparent impairments                                Following commands: Intact       Cueing  General Comments   Cueing Techniques: Verbal cues      Exercises     Shoulder Instructions      Home Living Family/patient expects to be discharged to:: Private residence Living Arrangements: Spouse/significant other Available Help at Discharge: Family;Available 24 hours/day Type of Home: House Home Access: Stairs to enter [employer] of Steps: 4 Entrance Stairs-Rails: Can reach both Home Layout: Two level Alternate Level Stairs-Number of Steps: flight Alternate Level Stairs-Rails: Can reach both Bathroom Shower/Tub: Producer, television/film/video: Standard     Home Equipment: None          Prior Functioning/Environment Prior Level of Function : Independent/Modified Independent;Driving             Mobility Comments: no AD, does aerobics at the [employer] ADLs Comments: indep    OT Problem List: Decreased strength;Decreased activity tolerance;Impaired balance (sitting and/or standing);Decreased knowledge of use of DME or AE   OT Treatment/Interventions: Self-care/ADL training;Therapeutic exercise;DME and/or AE instruction;Therapeutic activities;Patient/family education;Balance training      OT Goals(Current goals can be found in the care plan section)   Acute Rehab OT Goals OT Goal Formulation: With patient Time For Goal Achievement: 07/13/23 Potential to Achieve Goals: Good ADL Goals Pt Will Perform Grooming: standing;with contact guard assist Pt Will Perform Upper Body Bathing: with set-up;sitting Pt Will Perform Upper Body Dressing: with set-up;sitting Pt Will Transfer to Toilet: with contact guard assist;ambulating;bedside commode Pt Will Perform Toileting - Clothing Manipulation and hygiene: with min assist;sit to/from stand Pt/caregiver will Perform Home Exercise Program: Increased strength;Both right and left upper extremity;With theraband;Independently (level 1) Additional ADL Goal #1: Pt will  complete bed mobility with supervision in preparation for ADLs.   OT Frequency:  Min 2X/week    Co-evaluation              AM-PAC OT 6 Clicks Daily Activity     Outcome Measure Help from another person eating meals?: None Help from another person taking care of personal grooming?: A Little Help from another person toileting, which includes using toliet, bedpan, or urinal?: A Lot Help from another person bathing (including washing, rinsing, drying)?: A Lot Help from another person to put on and taking off regular upper body clothing?: A Little Help from another person to put on and taking off regular lower body clothing?: Total 6 Click Score: 15   End of Session Equipment Utilized During Treatment: Gait belt;Rolling walker (2 wheels) Nurse Communication: Mobility status  Activity Tolerance: Patient limited by fatigue Patient left: in chair;with call bell/phone within reach;with chair alarm set  OT Visit Diagnosis: Unsteadiness on feet (R26.81);Other abnormalities of gait and mobility (R26.89);Muscle weakness (generalized) (M62.81)                Time: 8949-8884 OT Time Calculation (min): 25 min Charges:  OT General Charges $OT Visit: 1 Visit OT Evaluation $OT Eval Moderate Complexity: 1 Mod OT Treatments $Therapeutic Activity: 8-22 mins  Mliss HERO, OTR/L Acute Rehabilitation Services Office: [phone]   Kenn[place],  Buster Schueller Lynn 06/29/2023, 11:27 AM

## 2023-06-30 ENCOUNTER — Encounter (HOSPITAL_COMMUNITY): Payer: Self-pay

## 2023-06-30 DIAGNOSIS — N179 Acute kidney failure, unspecified: Secondary | ICD-10-CM | POA: Diagnosis not present

## 2023-06-30 DIAGNOSIS — I10 Essential (primary) hypertension: Secondary | ICD-10-CM | POA: Diagnosis not present

## 2023-06-30 DIAGNOSIS — Z8673 Personal history of transient ischemic attack (TIA), and cerebral infarction without residual deficits: Secondary | ICD-10-CM | POA: Diagnosis not present

## 2023-06-30 DIAGNOSIS — E785 Hyperlipidemia, unspecified: Secondary | ICD-10-CM | POA: Diagnosis not present

## 2023-06-30 LAB — CBC WITH DIFFERENTIAL/PLATELET
Abs Immature Granulocytes: 0 10*3/uL (ref 0.00–0.07)
Basophils Absolute: 0.5 10*3/uL — ABNORMAL HIGH (ref 0.0–0.1)
Basophils Relative: 3 %
Eosinophils Absolute: 0 10*3/uL (ref 0.0–0.5)
Eosinophils Relative: 0 %
HCT: 39 % (ref 36.0–46.0)
Hemoglobin: 13.6 g/dL (ref 12.0–15.0)
Lymphocytes Relative: 7 %
Lymphs Abs: 1.1 10*3/uL (ref 0.7–4.0)
MCH: 30.2 pg (ref 26.0–34.0)
MCHC: 34.9 g/dL (ref 30.0–36.0)
MCV: 86.7 fL (ref 80.0–100.0)
Monocytes Absolute: 0.9 10*3/uL (ref 0.1–1.0)
Monocytes Relative: 6 %
Neutro Abs: 12.9 10*3/uL — ABNORMAL HIGH (ref 1.7–7.7)
Neutrophils Relative %: 84 %
Platelets: 130 10*3/uL — ABNORMAL LOW (ref 150–400)
RBC: 4.5 MIL/uL (ref 3.87–5.11)
RDW: 21.2 % — ABNORMAL HIGH (ref 11.5–15.5)
WBC: 15.3 10*3/uL — ABNORMAL HIGH (ref 4.0–10.5)
nRBC: 0 % (ref 0.0–0.2)
nRBC: 0 /100{WBCs}

## 2023-06-30 LAB — HEPATITIS PANEL, ACUTE
HCV Ab: NONREACTIVE
Hep A IgM: NONREACTIVE
Hep B C IgM: NONREACTIVE
Hepatitis B Surface Ag: NONREACTIVE

## 2023-06-30 LAB — ANCA PROFILE
Anti-MPO Antibodies: <0.2 U (ref 0.0–0.9)
Anti-PR3 Antibodies: <0.2 U (ref 0.0–0.9)
Atypical P-ANCA titer: <1:20 {titer}
C-ANCA: <1:20 {titer}
P-ANCA: 1:160 {titer} — ABNORMAL HIGH

## 2023-06-30 LAB — RENAL FUNCTION PANEL
Albumin: 1.8 g/dL — ABNORMAL LOW (ref 3.5–5.0)
Anion gap: 8 (ref 5–15)
BUN: 76 mg/dL — ABNORMAL HIGH (ref 8–23)
CO2: 23 mmol/L (ref 22–32)
Calcium: 8.6 mg/dL — ABNORMAL LOW (ref 8.9–10.3)
Chloride: 109 mmol/L (ref 98–111)
Creatinine, Ser: 2.66 mg/dL — ABNORMAL HIGH (ref 0.44–1.00)
GFR, Estimated: 18 mL/min — ABNORMAL LOW (ref >60)
Glucose, Bld: 102 mg/dL — ABNORMAL HIGH (ref 70–99)
Phosphorus: 5.6 mg/dL — ABNORMAL HIGH (ref 2.5–4.6)
Potassium: 4.2 mmol/L (ref 3.5–5.1)
Sodium: 140 mmol/L (ref 135–145)

## 2023-06-30 MED ORDER — POTASSIUM CHLORIDE CRYS ER 20 MEQ PO TBCR
20.0000 meq | EXTENDED_RELEASE_TABLET | Freq: Two times a day (BID) | ORAL | Status: DC
  Administered 2023-06-30 – 2023-07-04 (×8): 20 meq via ORAL
  Filled 2023-06-30 (×8): qty 1

## 2023-06-30 NOTE — Progress Notes (Signed)
  Inpatient Rehabilitation Admissions Coordinator   Spoke with patient by phone for rehab assessment. We discussed goals and expectations of a possible CIR admit. She prefers CIR for rehab. Spouse can provide expected caregiver support that is recommended . I await medial readiness and CIR bed availability when determined to be medically ready..Feel she could benefit from CIR level rehab. Please call me with any questions.   Heron Leavell, RN, MSN Rehab Admissions Coordinator [phone]

## 2023-06-30 NOTE — Progress Notes (Signed)
 Physical Therapy Treatment Patient Details Name: Marissa Orozco MRN: [id number] DOB: 1945-11-16 Today's Date: 06/30/2023   History of Present Illness Marissa Orozco is a 78 y.o. female who presented to ED with complaints of low blood pressure and fatigue. Pt found to have an acute kidney injury. Pt underwent Rt kidney biopsy 6/23. PMH: hx of CVA, HTN, HLD, hx of kidney stones    PT Comments  Pt slowly progressing towards goals. Pt with improved transfer ability this date however continues to present with overall deconditioning, decreased activity tolerance, and inability to ambulate further than transfer to chair due to fatigue. Continue to recommend inpatient rehab program > 3 hrs a day to address above deficits and progress towards indep function as pt was PTA. Acute PT to cont to follow.    If plan is discharge home, recommend the following: A lot of help with walking and/or transfers;A lot of help with bathing/dressing/bathroom;Assist for transportation;Help with stairs or ramp for entrance   Can travel by private vehicle        Equipment Recommendations  Rolling walker (2 wheels) (TBD at next venue)    Recommendations for Other Services Rehab consult     Precautions / Restrictions Precautions Precautions: Fall Recall of Precautions/Restrictions: Intact Restrictions Weight Bearing Restrictions Per Provider Order: No     Mobility  Bed Mobility Overal bed mobility: Needs Assistance Bed Mobility: Supine to Sit     Supine to sit: Mod assist     General bed mobility comments: HOB elevated, assist for LEs and hips to EOB, pt able to help more with UEs this date    Transfers Overall transfer level: Needs assistance Equipment used: Rolling walker (2 wheels) Transfers: Sit to/from Stand, Bed to chair/wheelchair/BSC Sit to Stand: From elevated surface, Min assist   Step pivot transfers: Min assist       General transfer comment: elevated bed, use of momentum, assist  to rise and steady, cues for hand placement, increased time for stepping sequencing, minA for walker management, increased trunk flexion, labor effort/quick onset of fatigue    Ambulation/Gait               General Gait Details: unable this date due to fatigue   Stairs             Wheelchair Mobility     Tilt Bed    Modified Rankin (Stroke Patients Only)       Balance Overall balance assessment: Needs assistance Sitting-balance support: Feet supported, Bilateral upper extremity supported Sitting balance-Leahy Scale: Fair Sitting balance - Comments: statically   Standing balance support: Bilateral upper extremity supported Standing balance-Leahy Scale: Poor Standing balance comment: min assist and B UEs on RW                            Communication Communication Communication: No apparent difficulties  Cognition Arousal: Alert Behavior During Therapy: Flat affect   PT - Cognitive impairments: No apparent impairments                       PT - Cognition Comments: depressed spirits but cooperative and appreciative of PT services Following commands: Intact      Cueing Cueing Techniques: Verbal cues  Exercises General Exercises - Lower Extremity Long Arc Quad: AROM, Both, 10 reps, Seated Hip ABduction/ADduction: AROM, Both, 10 reps, Seated (squeeze pillow and abduction against manual resistance) Hip Flexion/Marching: AROM, Both, 5 reps, Seated  Other Exercises Other Exercises: worked on pulling self forward for posterior lean using core muscle and pulling self forward using PT's UE to pull self too    General Comments General comments (skin integrity, edema, etc.): VSS      Pertinent Vitals/Pain Pain Assessment Pain Assessment: No/denies pain    Home Living                          Prior Function            PT Goals (current goals can now be found in the care plan section) Acute Rehab PT Goals Patient Stated  Goal: to get strong enough to go home PT Goal Formulation: With patient Time For Goal Achievement: 07/12/23 Potential to Achieve Goals: Good Progress towards PT goals: Progressing toward goals    Frequency    Min 3X/week      PT Plan      Co-evaluation              AM-PAC PT 6 Clicks Mobility   Outcome Measure  Help needed turning from your back to your side while in a flat bed without using bedrails?: A Lot Help needed moving from lying on your back to sitting on the side of a flat bed without using bedrails?: A Lot Help needed moving to and from a bed to a chair (including a wheelchair)?: A Lot Help needed standing up from a chair using your arms (e.g., wheelchair or bedside chair)?: A Lot Help needed to walk in hospital room?: Total Help needed climbing 3-5 steps with a railing? : Total 6 Click Score: 10    End of Session Equipment Utilized During Treatment: Gait belt Activity Tolerance: Patient limited by fatigue Patient left: in chair;with call bell/phone within reach;with chair alarm set Nurse Communication: Mobility status PT Visit Diagnosis: Unsteadiness on feet (R26.81);Muscle weakness (generalized) (M62.81);Difficulty in walking, not elsewhere classified (R26.2)     Time: 9099-9077 PT Time Calculation (min) (ACUTE ONLY): 22 min  Charges:    $Therapeutic Exercise: 8-22 mins $Therapeutic Activity: 8-22 mins PT General Charges $$ ACUTE PT VISIT: 1 Visit                     Norene Ames, PT, DPT Acute Rehabilitation Services Secure chat preferred Office #: [phone]    Norene CHRISTELLA Ames 06/30/2023, 9:29 AM

## 2023-06-30 NOTE — PMR Pre-admission (Signed)
 PMR Admission Coordinator Pre-Admission Assessment  Patient: Marissa Orozco is an 78 y.o., female MRN: [id number] DOB: 1945/05/05 Height: 5' 3.5 (161.3 cm) Weight: 80.5 kg  Insurance Information HMO:     PPO:      PCP:      IPA:      80/20:      OTHER:  PRIMARY: Medicare a and b      Policy#: [id number]      Subscriber: pt Benefits:  Phone #: passport one source online     Name: 6/26 Eff. Date: 01/04/2009     Deduct: $1676      Out of Pocket Max: none      Life Max: none CIR: 100%      SNF: 20 full days Outpatient: 80%     Co-Pay: 20% Home Health: 100%      Co-Pay:  DME: 80%     Co-Pay: 20% Providers: pt choice   Secondary: AARP supplement      Policy#: [id number]     Phone#:   Financial Counselor:       Phone#:   The "Data Collection Information Summary" for patients in Inpatient Rehabilitation Facilities with attached "Privacy Act Statement-Health Care Records" was provided and verbally reviewed with: Patient  Emergency Contact Information Contact Information     Name Relation Home Work Mobile   [place] Daughter   [phone]   Artia, Singley [phone]  [phone]      Other Contacts   None on File    Current Medical History  Patient Admitting Diagnosis: Debility, AKI  History of Present Illness: 78 yo female with history of CVA, HTN, HLD, kidney stones who presented on 06/17/23 to [facility] ED with complaints of low BP and fatigue.  Admitted to [facility] on 06/17/23.  Workup revealed AKI. Treated with IVF and renal consulted.  Found to have e coli UTI and treated with ceftriaxone .  Transferred to [facility] on 06/23/23 for renal biopsy.   Right IJ TDC placed on 6/21 due to rising BUN/CR. Received one round of HD on 6/21. Bx 6/23 which revealed chronic changes, AIN and some TMA, no immune complex medicated or crescentic GN. On 6/26 UOP and Scr improved and dialysis catheter removed.   RUQ US  with fatty liver disease  and noted abnormal LFTS. Was on  amlodipine  and atorvastatin  pta. Atorvastin stopped. Off of acetaminophen . GI consulted. Noted tranaminitis and bilirubinemia. Noted to have a 5.2 cm x 4.6 cm pancreatic cyst. Liver Bx 07/04/23.  Results felt most consistent with autoimmune hepatitis without cirrhosis. Began on Prednisone taper. Recommend close monitoring of liver enzymes and trend IgG. Outpatient referral to hepatology . Will need EUS to evaluate the bile duct and pancreatic cyst.   HTN and to remain off ARB in setting of AKI. Noted hypotension pta. Lasix  added to increase UOP. Hgb A1c 6.2, optimizing control to reduce risk of CKD progression.   Patient's medical record from [facility] and [facility] has been reviewed by the rehabilitation admission coordinator and physician.  Past Medical History  Past Medical History:  Diagnosis Date   Allergy 2000   Breast lump    Cataract    Colon polyps    Fibroids    GERD (gastroesophageal reflux disease)    Glaucoma    Heart murmur    Hyperlipidemia 01/05/1984   Hypertension 01/05/1984   Interstitial cystitis 01/04/1997   Kidney stones 01/05/2003   Neuromuscular disorder (HCC)    Osteoporosis    Preop cardiovascular  exam 10/25/2022   Sleep apnea 2021   Tonsillitis    Has the patient had major surgery during 100 days prior to admission? Yes  Family History   family history includes Arthritis in her mother; Cancer in her father and mother; Colon cancer in her maternal grandmother; Colon cancer (age of onset: 26) in her father; Colon cancer (age of onset: 79) in her mother; Coronary artery disease in her father; Diabetes in an other family member; Heart disease in her mother; Hypertension in her father and mother; Stroke in her father.  Current Medications  Current Facility-Administered Medications:    docusate sodium  (COLACE) capsule 100 mg, 100 mg, Oral, BID, Rai, Ripudeep K, MD, 100 mg at 07/07/23 9266   dorzolamide -timolol  (COSOPT ) 2-0.5 % ophthalmic solution 1 drop, 1  drop, Both Eyes, BID, Rai, Ripudeep K, MD, 1 drop at 07/07/23 9266   furosemide  (LASIX ) tablet 40 mg, 40 mg, Oral, BID, Coladonato, Joseph, MD, 40 mg at 07/07/23 9266   latanoprost  (XALATAN ) 0.005 % ophthalmic solution 1 drop, 1 drop, Both Eyes, QHS, Rai, Ripudeep K, MD, 1 drop at 07/06/23 2112   ondansetron  (ZOFRAN ) injection 4 mg, 4 mg, Intravenous, Q6H PRN, Rai, Ripudeep K, MD   Oral care mouth rinse, 15 mL, Mouth Rinse, PRN, Odell Celinda Balo, MD   polyethylene glycol (MIRALAX  / GLYCOLAX ) packet 17 g, 17 g, Oral, Daily PRN, Rai, Ripudeep K, MD, 17 g at 07/05/23 1213   predniSONE (DELTASONE) tablet 40 mg, 40 mg, Oral, Daily, Armbruster, Elspeth SQUIBB, MD, 40 mg at 07/07/23 9266  Patients Current Diet:  Diet Order             Diet renal with fluid restriction Fluid restriction: 1200 mL Fluid; Room service appropriate? Yes; Fluid consistency: Thin  Diet effective now                  Precautions / Restrictions Precautions Precautions: Fall Restrictions Weight Bearing Restrictions Per Provider Order: No   Has the patient had 2 or more falls or a fall with injury in the past year? No  Prior Activity Level Community (5-7x/wk): independent without AD, retired, drives, does Aerobics 3 times per week  Prior Functional Level Self Care: Did the patient need help bathing, dressing, using the toilet or eating? Independent  Indoor Mobility: Did the patient need assistance with walking from room to room (with or without device)? Independent  Stairs: Did the patient need assistance with internal or external stairs (with or without device)? Independent  Functional Cognition: Did the patient need help planning regular tasks such as shopping or remembering to take medications? Independent  Patient Information Are you of Hispanic, Latino/a,or Spanish origin?: A. No, not of Hispanic, Latino/a, or Spanish origin What is your race?: B. Black or African American Do you need or want an  interpreter to communicate with a doctor or health care staff?: 0. No  Patient's Response To:  Health Literacy and Transportation Is the patient able to respond to health literacy and transportation needs?: Yes Health Literacy - How often do you need to have someone help you when you read instructions, pamphlets, or other written material from your doctor or pharmacy?: Never In the past 12 months, has lack of transportation kept you from medical appointments or from getting medications?: No In the past 12 months, has lack of transportation kept you from meetings, work, or from getting things needed for daily living?: No  Journalist, newspaper / Equipment Home Equipment: None  Prior  Device Use: Indicate devices/aids used by the patient prior to current illness, exacerbation or injury? None of the above  Current Functional Level Cognition  Orientation Level: Oriented X4    Extremity Assessment (includes Sensation/Coordination)  Upper Extremity Assessment: Generalized weakness  Lower Extremity Assessment: Defer to PT evaluation    ADLs  Overall ADL's : Needs assistance/impaired Eating/Feeding: Independent, Sitting Grooming: Wash/dry hands, Wash/dry face, Set up, Supervision/safety, Sitting Upper Body Bathing: Contact guard assist, Sitting Lower Body Bathing: Maximal assistance, Sitting/lateral leans Upper Body Dressing : Set up, Supervision/safety, Sitting Lower Body Dressing: Total assistance, Sit to/from stand Toilet Transfer: Minimal assistance, Rolling walker (2 wheels), Cueing for safety, BSC/3in1, Ambulation Toileting- Clothing Manipulation and Hygiene: Minimal assistance, Sit to/from stand Functional mobility during ADLs: Minimal assistance, Rolling walker (2 wheels), Cueing for safety    Mobility  Overal bed mobility: Needs Assistance Bed Mobility: Supine to Sit Supine to sit: Min assist Sit to supine: Mod assist General bed mobility comments: Pt able to sit up trunk  with HOB elevated, minA to scoot R hip out to edge of bed    Transfers  Overall transfer level: Needs assistance Equipment used: Rolling walker (2 wheels) Transfers: Sit to/from Stand, Bed to chair/wheelchair/BSC Sit to Stand: Min assist Bed to/from chair/wheelchair/BSC transfer type:: Step pivot Stand pivot transfers: Min assist Step pivot transfers: Contact guard assist General transfer comment: Rocking forward for momentum, cues for nose over toes, minA to rise    Ambulation / Gait / Stairs / Wheelchair Mobility  Ambulation/Gait General Gait Details: small side steps along EOB for hip exercises (side stepping for gluteus medius activation) cues to increase step width for great activation. 2x5 reps bil.    Posture / Balance Dynamic Sitting Balance Sitting balance - Comments: statically Balance Overall balance assessment: Needs assistance Sitting-balance support: Feet supported, Bilateral upper extremity supported Sitting balance-Leahy Scale: Fair Sitting balance - Comments: statically Standing balance support: Bilateral upper extremity supported, During functional activity, Reliant on assistive device for balance Standing balance-Leahy Scale: Poor Standing balance comment: min assist and B UEs on RW    Special needs/care consideration Hgb A1c 6.2   Previous Home Environment  Living Arrangements: Spouse/significant other Available Help at Discharge: Family, Available 24 hours/day Type of Home: House Home Layout: Two level Alternate Level Stairs-Rails: Can reach both Alternate Level Stairs-Number of Steps: flight Home Access: Stairs to enter Entrance Stairs-Rails: Can reach both Entrance Stairs-Number of Steps: 4 Bathroom Shower/Tub: Health visitor: Standard Home Care Services: No  Discharge Living Setting Plans for Discharge Living Setting: Patient's home, Lives with (comment) (spouse) Type of Home at Discharge: House Discharge Home Layout: Two  level Alternate Level Stairs-Rails: Can reach both Alternate Level Stairs-Number of Steps: flight Discharge Home Access: Stairs to enter Entrance Stairs-Rails: Can reach both Entrance Stairs-Number of Steps: 4 Discharge Bathroom Shower/Tub: Walk-in shower Discharge Bathroom Toilet: Standard Discharge Bathroom Accessibility: Yes How Accessible: Accessible via walker Does the patient have any problems obtaining your medications?: No  Social/Family/Support Systems Patient Roles: Spouse Contact Information: spouse Anticipated Caregiver: spouse Anticipated Caregiver's Contact Information: see contacts Ability/Limitations of Caregiver: no limitations Caregiver Availability: 24/7 Discharge Plan Discussed with Primary Caregiver: Yes Is Caregiver In Agreement with Plan?: Yes Does Caregiver/Family have Issues with Lodging/Transportation while Pt is in Rehab?: No  Goals Patient/Family Goal for Rehab: Mod I to supervision with PT and OT Expected length of stay: ELOS 7 to 10 days Pt/Family Agrees to Admission and willing to participate: Yes Program Orientation Provided &  Reviewed with Pt/Caregiver Including Roles  & Responsibilities: Yes  Decrease burden of Care through IP rehab admission: n/a  Possible need for SNF placement upon discharge: not anticipated  Patient Condition: I have reviewed medical records from [facility] and [facility], spoken with  patient. I discussed via phone for inpatient rehabilitation assessment.  Patient will benefit from ongoing PT and OT, can actively participate in 3 hours of therapy a day 5 days of the week, and can make measurable gains during the admission.  Patient will also benefit from the coordinated team approach during an Inpatient Acute Rehabilitation admission.  The patient will receive intensive therapy as well as Rehabilitation physician, nursing, social worker, and care management interventions.  Due to bladder management, bowel management, safety,  skin/wound care, disease management, medication administration, pain management, and patient education the patient requires 24 hour a day rehabilitation nursing.  The patient is currently mod assist with mobility and basic ADLs.  Discharge setting and therapy post discharge at home with home health is anticipated.  Patient has agreed to participate in the Acute Inpatient Rehabilitation Program and will admit Sunday, 6/29.  Preadmission Screen Completed By:  Alison Heron Lot, RN MSN 07/07/2023 10:20 AM ______________________________________________________________________   Discussed status with Dr. Lanny Donoso on 07/07/23  at 1019 and received approval for admission today.  Admission Coordinator:  Suzzanne, Brunkhorst, RN MSN time 10:20 AM Pattricia 07/07/23    Assessment/Plan: Diagnosis: Debility due to ARF/autoimmune hepatitis Does the need for close, 24 hr/day Medical supervision in concert with the patient's rehab needs make it unreasonable for this patient to be served in a less intensive setting? Yes Co-Morbidities requiring supervision/potential complications: CVA, HTN, HLD; prediabetes A1c 6.2; CA 19-9 139; on high dose prednisone.  Due to bladder management, bowel management, safety, skin/wound care, disease management, medication administration, pain management, and patient education, does the patient require 24 hr/day rehab nursing? Yes Does the patient require coordinated care of a physician, rehab nurse, PT, OT, and SLP to address physical and functional deficits in the context of the above medical diagnosis(es)? Yes Addressing deficits in the following areas: balance, endurance, locomotion, strength, transferring, bowel/bladder control, bathing, dressing, feeding, grooming, and toileting Can the patient actively participate in an intensive therapy program of at least 3 hrs of therapy 5 days a week? Yes The potential for patient to make measurable gains while on inpatient rehab is  good Anticipated functional outcomes upon discharge from inpatient rehab: modified independent and supervision PT, modified independent and supervision OT, n/a SLP Estimated rehab length of stay to reach the above functional goals is: 7-10 days Anticipated discharge destination: Home 10. Overall Rehab/Functional Prognosis: good   MD Signature:

## 2023-06-30 NOTE — Progress Notes (Signed)
 Patient ID: VIRGEN BELLAND, female   DOB: [birth year], 78 y.o.   MRN: [id number] S: Still feels weak and tired.  Has US  of liver which revealed fatty liver, no evidence of cirrhosis O:BP 116/64 (BP Location: Left Arm)   Pulse 72   Temp 98.3 F (36.8 C)   Resp 19   Ht 5' 3.5 (1.613 m)   Wt 79.8 kg   SpO2 99%   BMI 30.68 kg/m   Intake/Output Summary (Last 24 hours) at 06/30/2023 1046 Last data filed at 06/30/2023 0600 Gross per 24 hour  Intake 540 ml  Output 600 ml  Net -60 ml   Intake/Output: I/O last 3 completed shifts: In: 860 [P.O.:860] Out: 1750 [Urine:1750]  Intake/Output this shift:  No intake/output data recorded. Weight change:  Gen: NAD CVS: RRR Resp:CTA Abd: +BS, soft, NT/ND Ext: no edema  Recent Labs  Lab 06/24/23 0654 06/25/23 0807 06/26/23 0533 06/27/23 0539 06/28/23 0522 06/29/23 0451 06/29/23 0940 06/30/23 0543  NA 144 140 137 139 139 139  --  140  K 3.4* 2.7* 2.5* 2.9* 3.1* 3.2*  --  4.2  CL 117* 107 106 105 105 106  --  109  CO2 14* 21* 25 23 23 22   --  23  GLUCOSE 97 86 100* 97 152* 133*  --  102*  BUN 71* 34* 44* 50* 61* 73*  --  76*  CREATININE 5.47* 3.12* 3.64* 3.46* 3.25* 3.30*  --  2.66*  ALBUMIN 2.1* 2.1* 1.9* 1.9* 1.9* 1.7* 1.8* 1.8*  CALCIUM  8.4* 8.0* 7.3* 8.2* 8.2* 8.2*  --  8.6*  PHOS 6.6* 4.2 4.8* 5.6* 5.0* 5.2*  --  5.6*  AST  --   --   --   --   --   --  927*  --   ALT  --   --   --   --   --   --  448*  --    Liver Function Tests: Recent Labs  Lab 06/29/23 0451 06/29/23 0940 06/30/23 0543  AST  --  927*  --   ALT  --  448*  --   ALKPHOS  --  262*  --   BILITOT  --  4.0*  --   PROT  --  5.5*  --   ALBUMIN 1.7* 1.8* 1.8*   No results for input(s): LIPASE, AMYLASE in the last 168 hours. No results for input(s): AMMONIA in the last 168 hours. CBC: Recent Labs  Lab 06/26/23 0533 06/27/23 0539 06/28/23 0522 06/29/23 0940 06/30/23 0543  WBC 12.3* 14.0* 14.4* 13.9* 15.3*  NEUTROABS  --   --   --  10.5* 12.9*   HGB 13.3 13.9 13.7 13.8 13.6  HCT 37.1 39.0 38.5 40.0 39.0  MCV 82.8 83.7 84.2 87.0 86.7  PLT 113* 107* 116* 108* 130*   Cardiac Enzymes: No results for input(s): CKTOTAL, CKMB, CKMBINDEX, TROPONINI in the last 168 hours. CBG: No results for input(s): GLUCAP in the last 168 hours.  Iron Studies: No results for input(s): IRON, TIBC, TRANSFERRIN, FERRITIN in the last 72 hours. Studies/Results: US  Abdomen Limited RUQ (LIVER/GB) Result Date: 06/29/2023 CLINICAL DATA:  Abnormal LFTs EXAM: ULTRASOUND ABDOMEN LIMITED RIGHT UPPER QUADRANT COMPARISON:  None Available. FINDINGS: Gallbladder: No gallstones or wall thickening visualized. No sonographic Murphy sign noted by sonographer. Common bile duct: Diameter: 5.5 mm Liver: Mildly increased in echogenicity consistent with fatty infiltration. No focal mass is seen. Portal vein is patent on color Doppler imaging with normal  direction of blood flow towards the liver. Other: None. IMPRESSION: Fatty liver.  No other focal abnormality is seen. Electronically Signed   By: Oneil Devonshire M.D.   On: 06/29/2023 20:29    Chlorhexidine  Gluconate Cloth  6 each Topical Q0600   Chlorhexidine  Gluconate Cloth  6 each Topical Q0600   docusate sodium   100 mg Oral BID   dorzolamide -timolol   1 drop Both Eyes BID   ezetimibe   10 mg Oral Daily   furosemide   40 mg Intravenous Q12H   latanoprost   1 drop Both Eyes QHS   potassium chloride   40 mEq Oral BID    BMET    Component Value Date/Time   NA 140 06/30/2023 0543   NA 141 06/02/2023 1039   K 4.2 06/30/2023 0543   CL 109 06/30/2023 0543   CO2 23 06/30/2023 0543   GLUCOSE 102 (H) 06/30/2023 0543   BUN 76 (H) 06/30/2023 0543   BUN 15 06/02/2023 1039   CREATININE 2.66 (H) 06/30/2023 0543   CALCIUM  8.6 (L) 06/30/2023 0543   GFRNONAA 18 (L) 06/30/2023 0543   GFRAA 89 10/18/2019 0913   CBC    Component Value Date/Time   WBC 15.3 (H) 06/30/2023 0543   RBC 4.50 06/30/2023 0543   HGB 13.6  06/30/2023 0543   HGB 14.2 02/09/2022 0913   HCT 39.0 06/30/2023 0543   HCT 41.9 02/09/2022 0913   PLT 130 (L) 06/30/2023 0543   PLT 314 02/09/2022 0913   MCV 86.7 06/30/2023 0543   MCV 90 02/09/2022 0913   MCH 30.2 06/30/2023 0543   MCHC 34.9 06/30/2023 0543   RDW 21.2 (H) 06/30/2023 0543   RDW 13.3 02/09/2022 0913   LYMPHSABS 1.1 06/30/2023 0543   LYMPHSABS 1.5 05/25/2018 1029   MONOABS 0.9 06/30/2023 0543   EOSABS 0.0 06/30/2023 0543   EOSABS 0.1 05/25/2018 1029   BASOSABS 0.5 (H) 06/30/2023 0543   BASOSABS 0.1 05/25/2018 1029    Assessment/ Plan:   # AKI - Ischemic and prerenal insults - hypotension at home, ARB use, and  UTI.  But with concurrent microscopic hematuria and subnephrotic range proteinuria.  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  Will check PR-3 and MPO assays. - DSDNA, dsDNA, antiSM neg; ANCA P, - SPEP no M-paraprotein, K/L high but from renal failure - s/p kidney biopsy 06/27/23- which revealed chronic changes, AIN, and some TMA, no immune complex mediated or crescentic GN.  She denies any new medications prior to admission and does not take any PPI's.  No recent antibiotics.   - Feeling worse on 6/21 with rising BUN/Cr ->  s/p RIJ TDC and HD #1 early on 6/21 - UOP and Scr have markedly improved over the last 48 hours.  Continue to hold off on HD for now and continue to follow for ongoing renal recovery.   Scr down to 2.66   # Abnormal LFT's  - RUQ US  with fatty liver disease - she was also on amlodipine  and atorvastatin  prior to admission but atorvastatin  was stopped on 06/20/23. - off of acetaminophen  - hepatitis panel pending.  May benefit from GI evaluation  # Hypertension - As above, remain off of ARB for now in the setting of AKI - Noted report of hypotension prior to admission - Acceptable on current regimen  - added some lasix  given inc UOP and volume  overload with  good response   # Proteinuria  - up/cr ratio 2530 mg/g  - GN w/u as above - off of ARB for AKI      # Microscopic hematuria  - Note UTI which may explain - abx per primary team  - GN w/u as above     # UTI - E coli UTI - Ceftriaxone  daily started on 6/15 - will need 1 week of therapy for complicated UTI with hematuria    # Pre-DM - Note A1c 6.2 on 06/02/23 - Optimizing pre-DM can reduce risk of CKD progression   # Angiomyolipoma - Probable angiomyolipoma on renal US  - Radiology recommended a follow-up renal ultrasound in 6 months   # Hypokalemia - replete-  also have stopped the sodium bicarb-  continue with po supplementation   # Deconditioning - may need CIR or SNF placement for ongoing PT/OT.  Fairy RONAL Sellar, MD [facility]

## 2023-06-30 NOTE — Progress Notes (Signed)
 PROGRESS NOTE    Marissa Orozco  FMW:[id number] DOB: [birth year] DOA: 06/17/2023 PCP: Jarold Medici, MD   Brief Narrative:  Marissa Orozco is an 78 y.o. female past medical history of CVA, essential hypertension hyperlipidemia history of kidney stones coming to the ED complaining of fatigue was found to have low blood pressure, she relates significant poor intake of less than the days, called her PCP to told her to come to the ED.  Was found to be in acute kidney injury and currently undergoing further renal workup.  Had to have a dialysis catheter placed and dialyzed on 06/25/2003.  Concern for glomerulonephritis however per Nephrology, renal biopsy done and revealed chronic changes, AIN and some TMA but no immune complex mediated or crescentic glomerular nephritis noted.  Appears significantly fatigued and deconditioned so PT/OT are recommending CIR.  Incidentally also noted to have abnormal LFTs so we will do further workup for this as well and obtain RUQ U/S which showed Fatty Liver Disease and Acute Hepatitis Panel Negative.   Assessment and Plan:  ATN/ Normal Anion Gap Metabolic Acidosis/ Uremic: Hyperphosphatemia -BUN/Cr Trend: Recent Labs  Lab 06/24/23 0654 06/25/23 0807 06/26/23 0533 06/27/23 0539 06/28/23 0522 06/29/23 0451 06/30/23 0543  BUN 71* 34* 44* 50* 61* 73* 76*  CREATININE 5.47* 3.12* 3.64* 3.46* 3.25* 3.30* 2.66*  -U/A showed white blood cells and protein.  C3 and C4 complements are low, ANA positive as well as p-ANCA; No M spike on SPEP. -Phos Level went from 5.0 -> 5.2 -> 5.6 -Avoid Nephrotoxic Medications, Contrast Dyes, Hypotension and Dehydration to Ensure Adequate Renal Perfusion and will need to Renally Adjust Meds -Status post renal biopsy on 06/27/2023 by IR as there is concern for Glomerulonephritis.  No biopsy revealed chronic changes, AIN, and some TMA but no immune complex mediated and crescentic glomerulonephritis -Status post dialysis catheter  placement and first dialysis in 06/25/2023. Follow ongoing renal recovery. -Continue to Monitor and Trend Renal Function carefully and repeat CMP in the AM  -Further Management per Nephrology; Renal Fxn is improving  -PT/OT consulted and recommending CIR now given her deconditioning  Proteinuria: Urine protein/creatinine ratio was 2530 and currently getting workup as above.  She is off of ARB given her AKI and nephrology following closely  Microscopic Hematuria: Likely in the setting of UTI.  Antibiotics have now been discontinued.  She has had renal workup as above   Essential HTN (Hypertension): Holding ARB in the setting of ATN. Blood pressure continues to be stable and now on the softer side. Continue IV Lasix  40 mg BID for volume overload as urine output has picked up. CTM BP per Protcol. Last BP reading was 116/64   Abnormal LFTs / Hyperbilirubinemia: AST is now 927 and ALT is 448. T Bili is now 4.0 (1.9 Indirect, 2.1 Direct) on last check. Repeat LFTs this AM pending. Will D/C Acetaminophen  given Abnormal LFTs. Checked RUQ U/S and showed Fatty Liver Disease. Acute Hepatitis Panel Negative. CTM and Trend Hepatic Fxn Panel and repeat CMP in the AM. May benefit from GI evaluation but will obtain repeat labs prior to discussing with them   Pre-Diabetes: Recent hemoglobin A1c was 6.2 on 06/02/2023.  CTM CBGs per protocol.  Blood Sugars ranging from 97-152 on daily BMP/CMP's.   E. coli UTI: Completed a 7 day course of IV Ceftriaxone . WBC went from 14.4 -> 13.9 -> 15.3. ? Reactive Leukocytosis. CTM and Trend and repeat CBC in the AM.    Hyperlipidemia: C/w Ezetimibe  10  mg po Daily. Currently holding Atorvastatin  80 mg po Daily given Abnormal LFTs   History of CVA: Holding Aspirin  81 mg po daily and Atorvastatin  80 mg po Daily.  Glaucoma: C/w Dorzolamide -Timolol  2-0.5% ophthalmic solution 1 drop both Eyes BID and Latanoprost  1 drop Both Eyes qHS   Hypokalemia: K+ went from 2.9 -> 3.1 -> 3.2 ->  4.2. Increase dK+ repletion from po KCL 20 mEQ BID to 40 mEQ BID yesterday but will go back to 20 mEQ BID. CTM and Trend and Replete as Necessary. Goal K+ > 4.0 and Mag > 2.0. Repeat CMP and Mag Level in the AM  Thrombocytopenia: Platelet Count went from 113 -> 107 -> 116 -> 108 -> 130. CTM and for S/Sx of Bleeding; No overt bleeding noted. Repeat CBC in the AM   Hypoalbuminemia: Patient's Albumin Level went from 2.1 -> 1.9 -> 1.7 -> 1.8. CTM and Trend and repeat CMP in the AM  Class I Obesity: Complicates overall prognosis and care. Estimated body mass index is 30.68 kg/m as calculated from the following:   Height as of this encounter: 5' 3.5 (1.613 m).   Weight as of this encounter: 79.8 kg. Weight Loss and Dietary Counseling given   DVT prophylaxis: Place and maintain sequential compression device Start: 06/20/23 1531    Code Status: Full Code Family Communication: No family present @ bedside   Disposition Plan:  Level of care: Telemetry Medical Status is: Inpatient Remains inpatient appropriate because: Needs further clinical improvement and clearance by the specialists and improvement in LFTs   Consultants:  Nephrology IR  Procedures:  As delineated as above. Renal Bx done  Antimicrobials:  Anti-infectives (From admission, onward)    Start     Dose/Rate Route Frequency Ordered Stop   06/24/23 1430  ceFAZolin  (ANCEF ) IVPB 2g/100 mL premix        2 g 200 mL/hr over 30 Minutes Intravenous  Once 06/24/23 1333 06/25/23 0743   06/24/23 1415  ceFAZolin  (ANCEF ) IVPB 2g/100 mL premix  Status:  Discontinued        2 g 200 mL/hr over 30 Minutes Intravenous  Once 06/24/23 1327 06/24/23 1333   06/19/23 2000  cefTRIAXone  (ROCEPHIN ) 1 g in sodium chloride  0.9 % 100 mL IVPB        1 g 200 mL/hr over 30 Minutes Intravenous Every 24 hours 06/19/23 1928 06/26/23 0748       Subjective: Seen and examined at bedside and she continues to be extremely weak and fatigued at her bedside.   Denies any nausea or vomiting.  Feels okay otherwise.  No other concerns or complaints at this time.  Objective: Vitals:   06/29/23 0815 06/29/23 1625 06/29/23 2019 06/30/23 0718  BP: 103/68 126/74 113/63 116/64  Pulse: 71 71 68 72  Resp: 18 19 18 19   Temp: 98 F (36.7 C) 98.1 F (36.7 C) 98.3 F (36.8 C)   TempSrc:      SpO2: 99% 100% 98% 99%  Weight:      Height:        Intake/Output Summary (Last 24 hours) at 06/30/2023 1318 Last data filed at 06/30/2023 0600 Gross per 24 hour  Intake 540 ml  Output 600 ml  Net -60 ml   Filed Weights   06/26/23 0500 06/26/23 1951 06/27/23 0701  Weight: 82.6 kg 81.6 kg 79.8 kg   Examination: Physical Exam:  Constitutional: WN/WD, obese African-American female no acute distress appears lethargic and tired Respiratory: Diminished to auscultation bilaterally, no  wheezing, rales, rhonchi or crackles. Normal respiratory effort and patient is not tachypenic. No accessory muscle use.  Unlabored breathing Cardiovascular: RRR, no murmurs / rubs / gallops. S1 and S2 auscultated.  Has mild extremity edema.  Abdomen: Soft, non-tender, distended secondary to body habitus. Bowel sounds positive.  GU: Deferred. Musculoskeletal: No clubbing / cyanosis of digits/nails. No joint deformity upper and lower extremities.  Has a right IJ TDC Skin: No rashes, lesions, ulcers. No induration; Warm and dry.  Neurologic: CN 2-12 grossly intact with no focal deficits. Romberg sign cerebellar reflexes not assessed.  Psychiatric: Normal judgment and insight. Alert and oriented x 3. Normal mood and appropriate affect.   Data Reviewed: I have personally reviewed following labs and imaging studies  CBC: Recent Labs  Lab 06/26/23 0533 06/27/23 0539 06/28/23 0522 06/29/23 0940 06/30/23 0543  WBC 12.3* 14.0* 14.4* 13.9* 15.3*  NEUTROABS  --   --   --  10.5* 12.9*  HGB 13.3 13.9 13.7 13.8 13.6  HCT 37.1 39.0 38.5 40.0 39.0  MCV 82.8 83.7 84.2 87.0 86.7  PLT 113*  107* 116* 108* 130*   Basic Metabolic Panel: Recent Labs  Lab 06/26/23 0533 06/27/23 0539 06/28/23 0522 06/29/23 0451 06/29/23 0940 06/30/23 0543  NA 137 139 139 139  --  140  K 2.5* 2.9* 3.1* 3.2*  --  4.2  CL 106 105 105 106  --  109  CO2 25 23 23 22   --  23  GLUCOSE 100* 97 152* 133*  --  102*  BUN 44* 50* 61* 73*  --  76*  CREATININE 3.64* 3.46* 3.25* 3.30*  --  2.66*  CALCIUM  7.3* 8.2* 8.2* 8.2*  --  8.6*  MG  --   --   --   --  1.9  --   PHOS 4.8* 5.6* 5.0* 5.2*  --  5.6*   GFR: Estimated Creatinine Clearance: 17.4 mL/min (A) (by C-G formula based on SCr of 2.66 mg/dL (H)). Liver Function Tests: Recent Labs  Lab 06/27/23 0539 06/28/23 0522 06/29/23 0451 06/29/23 0940 06/30/23 0543  AST  --   --   --  927*  --   ALT  --   --   --  448*  --   ALKPHOS  --   --   --  262*  --   BILITOT  --   --   --  4.0*  --   PROT  --   --   --  5.5*  --   ALBUMIN 1.9* 1.9* 1.7* 1.8* 1.8*   No results for input(s): LIPASE, AMYLASE in the last 168 hours. No results for input(s): AMMONIA in the last 168 hours. Coagulation Profile: Recent Labs  Lab 06/27/23 0539  INR 1.4*   Cardiac Enzymes: No results for input(s): CKTOTAL, CKMB, CKMBINDEX, TROPONINI in the last 168 hours. BNP (last 3 results) No results for input(s): PROBNP in the last 8760 hours. HbA1C: No results for input(s): HGBA1C in the last 72 hours. CBG: No results for input(s): GLUCAP in the last 168 hours. Lipid Profile: No results for input(s): CHOL, HDL, LDLCALC, TRIG, CHOLHDL, LDLDIRECT in the last 72 hours. Thyroid  Function Tests: No results for input(s): TSH, T4TOTAL, FREET4, T3FREE, THYROIDAB in the last 72 hours. Anemia Panel: No results for input(s): VITAMINB12, FOLATE, FERRITIN, TIBC, IRON, RETICCTPCT in the last 72 hours. Sepsis Labs: No results for input(s): PROCALCITON, LATICACIDVEN in the last 168 hours.  Recent Results (from the past 240  hours)  MRSA Next Gen by PCR, Nasal     Status: None   Collection Time: 06/29/23  8:21 AM   Specimen: Nasal Mucosa; Nasal Swab  Result Value Ref Range Status   MRSA by PCR Next Gen NOT DETECTED NOT DETECTED Final    Comment: (NOTE) The GeneXpert MRSA Assay (FDA approved for NASAL specimens only), is one component of a comprehensive MRSA colonization surveillance program. It is not intended to diagnose MRSA infection nor to guide or monitor treatment for MRSA infections. Test performance is not FDA approved in patients less than 95 years old. Performed at [facility], [address] [place], [place] [address]     Radiology Studies: US  Abdomen Limited RUQ (LIVER/GB) Result Date: 06/29/2023 CLINICAL DATA:  Abnormal LFTs EXAM: ULTRASOUND ABDOMEN LIMITED RIGHT UPPER QUADRANT COMPARISON:  None Available. FINDINGS: Gallbladder: No gallstones or wall thickening visualized. No sonographic Murphy sign noted by sonographer. Common bile duct: Diameter: 5.5 mm Liver: Mildly increased in echogenicity consistent with fatty infiltration. No focal mass is seen. Portal vein is patent on color Doppler imaging with normal direction of blood flow towards the liver. Other: None. IMPRESSION: Fatty liver.  No other focal abnormality is seen. Electronically Signed   By: Oneil Devonshire M.D.   On: 06/29/2023 20:29   Scheduled Meds:  Chlorhexidine  Gluconate Cloth  6 each Topical Q0600   Chlorhexidine  Gluconate Cloth  6 each Topical Q0600   docusate sodium   100 mg Oral BID   dorzolamide -timolol   1 drop Both Eyes BID   ezetimibe   10 mg Oral Daily   furosemide   40 mg Intravenous Q12H   latanoprost   1 drop Both Eyes QHS   potassium chloride   20 mEq Oral BID   Continuous Infusions:   LOS: 12 days   Alejandro Marker, DO [facility] Available via Epic secure chat 7am-7pm After these hours, please refer to coverage provider listed on [email or web address] 06/30/2023, 1:18 PM

## 2023-07-01 DIAGNOSIS — Z8673 Personal history of transient ischemic attack (TIA), and cerebral infarction without residual deficits: Secondary | ICD-10-CM | POA: Diagnosis not present

## 2023-07-01 DIAGNOSIS — E785 Hyperlipidemia, unspecified: Secondary | ICD-10-CM | POA: Diagnosis not present

## 2023-07-01 DIAGNOSIS — N179 Acute kidney failure, unspecified: Secondary | ICD-10-CM | POA: Diagnosis not present

## 2023-07-01 DIAGNOSIS — I10 Essential (primary) hypertension: Secondary | ICD-10-CM | POA: Diagnosis not present

## 2023-07-01 LAB — CBC WITH DIFFERENTIAL/PLATELET
Abs Immature Granulocytes: 0.1 10*3/uL — ABNORMAL HIGH (ref 0.00–0.07)
Basophils Absolute: 0.1 10*3/uL (ref 0.0–0.1)
Basophils Relative: 1 %
Eosinophils Absolute: 0.2 10*3/uL (ref 0.0–0.5)
Eosinophils Relative: 1 %
HCT: 41.1 % (ref 36.0–46.0)
Hemoglobin: 14.2 g/dL (ref 12.0–15.0)
Immature Granulocytes: 1 %
Lymphocytes Relative: 14 %
Lymphs Abs: 2.1 10*3/uL (ref 0.7–4.0)
MCH: 30.5 pg (ref 26.0–34.0)
MCHC: 34.5 g/dL (ref 30.0–36.0)
MCV: 88.4 fL (ref 80.0–100.0)
Monocytes Absolute: 1.1 10*3/uL — ABNORMAL HIGH (ref 0.1–1.0)
Monocytes Relative: 8 %
Neutro Abs: 11.4 10*3/uL — ABNORMAL HIGH (ref 1.7–7.7)
Neutrophils Relative %: 75 %
Platelets: 120 10*3/uL — ABNORMAL LOW (ref 150–400)
RBC: 4.65 MIL/uL (ref 3.87–5.11)
RDW: 22 % — ABNORMAL HIGH (ref 11.5–15.5)
WBC: 15.1 10*3/uL — ABNORMAL HIGH (ref 4.0–10.5)
nRBC: 0 % (ref 0.0–0.2)

## 2023-07-01 LAB — SURGICAL PATHOLOGY

## 2023-07-01 LAB — COMPREHENSIVE METABOLIC PANEL WITH GFR
ALT: 506 U/L — ABNORMAL HIGH (ref 0–44)
AST: 897 U/L — ABNORMAL HIGH (ref 15–41)
Albumin: 1.8 g/dL — ABNORMAL LOW (ref 3.5–5.0)
Alkaline Phosphatase: 248 U/L — ABNORMAL HIGH (ref 38–126)
Anion gap: 10 (ref 5–15)
BUN: 72 mg/dL — ABNORMAL HIGH (ref 8–23)
CO2: 21 mmol/L — ABNORMAL LOW (ref 22–32)
Calcium: 8.6 mg/dL — ABNORMAL LOW (ref 8.9–10.3)
Chloride: 110 mmol/L (ref 98–111)
Creatinine, Ser: 2.45 mg/dL — ABNORMAL HIGH (ref 0.44–1.00)
GFR, Estimated: 20 mL/min — ABNORMAL LOW (ref >60)
Glucose, Bld: 132 mg/dL — ABNORMAL HIGH (ref 70–99)
Potassium: 4.2 mmol/L (ref 3.5–5.1)
Sodium: 141 mmol/L (ref 135–145)
Total Bilirubin: 3.9 mg/dL — ABNORMAL HIGH (ref 0.0–1.2)
Total Protein: 5.4 g/dL — ABNORMAL LOW (ref 6.5–8.1)

## 2023-07-01 LAB — PHOSPHORUS: Phosphorus: 4.2 mg/dL (ref 2.5–4.6)

## 2023-07-01 LAB — MAGNESIUM: Magnesium: 1.9 mg/dL (ref 1.7–2.4)

## 2023-07-01 LAB — PATHOLOGIST SMEAR REVIEW

## 2023-07-01 MED ORDER — FUROSEMIDE 40 MG PO TABS
40.0000 mg | ORAL_TABLET | Freq: Two times a day (BID) | ORAL | Status: DC
  Administered 2023-07-01 – 2023-07-07 (×11): 40 mg via ORAL
  Filled 2023-07-01 (×12): qty 1

## 2023-07-01 NOTE — Progress Notes (Signed)
 PROGRESS NOTE    Marissa Orozco  FMW:[id number] DOB: 06-28-1945 DOA: 06/17/2023 PCP: Jarold Medici, MD   Brief Narrative:  Marissa Orozco is an 78 y.o. female past medical history of CVA, essential hypertension hyperlipidemia history of kidney stones coming to the ED complaining of fatigue was found to have low blood pressure, she relates significant poor intake of less than the days, called her PCP to told her to come to the ED.  Was found to be in acute kidney injury and currently undergoing further renal workup.  Had to have a dialysis catheter placed and dialyzed on 06/25/2003.  Concern for glomerulonephritis however per Nephrology, renal biopsy done and revealed chronic changes, AIN and some TMA but no immune complex mediated or crescentic glomerular nephritis noted.  Appears significantly fatigued and deconditioned so PT/OT are recommending CIR.  Incidentally also noted to have abnormal LFTs so we will do further workup for this as well and obtain RUQ U/S which showed Fatty Liver Disease and Acute Hepatitis Panel Negative.  Pursuing further hepatic workup after discussion with GI.  Assessment and Plan:  ATN/ Normal Anion Gap Metabolic Acidosis/ Uremic: Hyperphosphatemia -BUN/Cr Trend: Recent Labs  Lab 06/25/23 0807 06/26/23 0533 06/27/23 0539 06/28/23 0522 06/29/23 0451 06/30/23 0543 07/01/23 0652  BUN 34* 44* 50* 61* 73* 76* 72*  CREATININE 3.12* 3.64* 3.46* 3.25* 3.30* 2.66* 2.45*  -U/A showed white blood cells and protein.  C3 and C4 complements are low, ANA positive as well as p-ANCA 1:160; No M spike on SPEP. -Phos Level went from 5.0 -> 5.2 -> 5.6 -Has a slight metabolic acidosis with a CO2 of 21, anion gap of -Avoid Nephrotoxic Medications, Contrast Dyes, Hypotension and Dehydration to Ensure Adequate Renal Perfusion and will need to Renally Adjust Meds -Status post renal biopsy on 06/27/2023 by IR as there is concern for Glomerulonephritis.  No biopsy revealed chronic  changes, AIN, and some TMA but no immune complex mediated and crescentic glomerulonephritis -Status post dialysis catheter placement and first dialysis in 06/25/2023. Follow ongoing renal recovery. -Continue to Monitor and Trend Renal Function carefully and repeat CMP in the AM  -Further Management per Nephrology; Renal Fxn is improving  -PT/OT consulted and recommending CIR now given her deconditioning with tentative discharge plan being 6/29 if she improves.  Nephrology recommended to keep [facility] in place but recommending removing early next week if serum creatinine continues to improve  Proteinuria: Urine protein/creatinine ratio was 2530 and currently getting workup as above.  She is off of ARB given her AKI and nephrology following closely  Microscopic Hematuria: Likely in the setting of UTI.  Antibiotics have now been discontinued.  She has had renal workup as above   Essential HTN (Hypertension): Holding ARB in the setting of ATN. Blood pressure continues to be stable and now on the softer side. IV Lasix  40 mg BID changing po Furosemide  40 mg po BID. CTM BP per Protcol. Last BP reading was 95/56   Abnormal LFTs / Hyperbilirubinemia: AST is now 927 -> 897 and ALT is 448 -> 406. T Bili is now 4.0 (1.9 Indirect, 2.1 Direct) -> 3.9 on last check. D/C'd Acetaminophen  given Abnormal LFTs. Checked RUQ U/S and showed Fatty Liver Disease. Acute Hepatitis Panel Negative. CTM and Trend Hepatic Fxn Panel and repeat CMP in the AM. D/w GI informally and they are recommending checking an IgG level, anti-smooth muscle antibody, CMV, Epstein-Barr and HSV as well as getting a noncontrast MRCP.  After this has been done  recommendations and necessary will formally consult.  Nephrology is checking iron studies to evaluate for hemochromatosis  Pre-Diabetes: Recent hemoglobin A1c was 6.2 on 06/02/2023.  CTM CBGs per protocol.  Blood Sugars ranging from 97-152 on daily BMP/CMP's.   E. coli UTI: Completed a 7 day course of  IV Ceftriaxone . WBC went from 14.4 -> 13.9 -> 15.3 -> 15.1. ? Reactive Leukocytosis. CTM and Trend and repeat CBC in the AM.    Hyperlipidemia: Discontinue Ezetimibe  10 mg po Daily. Currently holding Atorvastatin  80 mg po Daily given Abnormal LFTs   History of CVA: Holding Aspirin  81 mg po daily and Atorvastatin  80 mg po Daily.  Glaucoma: C/w Dorzolamide -Timolol  2-0.5% ophthalmic solution 1 drop both Eyes BID and Latanoprost  1 drop Both Eyes qHS   Hypokalemia: K+ went from 2.9 -> 3.1 -> 3.2 -> 4.2 x2. Increase dK+ repletion from po KCL 20 mEQ BID to 40 mEQ BID yesterday but will go back to 20 mEQ BID. CTM and Trend and Replete as Necessary. Goal K+ > 4.0 and Mag > 2.0. Repeat CMP and Mag Level in the AM  Thrombocytopenia: Platelet Count went from 113 -> 107 -> 116 -> 108 -> 130 -> 120. CTM and for S/Sx of Bleeding; No overt bleeding noted. Repeat CBC in the AM   Hypoalbuminemia: Patient's Albumin Level went from 2.1 -> 1.9 -> 1.7 -> 1.8 x2. CTM and Trend and repeat CMP in the AM  Class I Obesity: Complicates overall prognosis and care. Estimated body mass index is 30.68 kg/m as calculated from the following:   Height as of this encounter: 5' 3.5 (1.613 m).   Weight as of this encounter: 79.8 kg. Weight Loss and Dietary Counseling given  DVT prophylaxis: Place and maintain sequential compression device Start: 06/20/23 1531    Code Status: Full Code Family Communication: No family currently at bedside  Disposition Plan:  Level of care: Telemetry Medical Status is: Inpatient Remains inpatient appropriate because: Anticipate discharge to CIR but will need further workup of her liver function studies and clearance by the nephrology team   Consultants:  Nephrology Interventional radiology Discussed the case with gastroenterology Dr.  Procedures:  As delineated as above. Renal Bx done   Antimicrobials:  Anti-infectives (From admission, onward)    Start     Dose/Rate Route Frequency  Ordered Stop   06/24/23 1430  ceFAZolin  (ANCEF ) IVPB 2g/100 mL premix        2 g 200 mL/hr over 30 Minutes Intravenous  Once 06/24/23 1333 06/25/23 0743   06/24/23 1415  ceFAZolin  (ANCEF ) IVPB 2g/100 mL premix  Status:  Discontinued        2 g 200 mL/hr over 30 Minutes Intravenous  Once 06/24/23 1327 06/24/23 1333   06/19/23 2000  cefTRIAXone  (ROCEPHIN ) 1 g in sodium chloride  0.9 % 100 mL IVPB        1 g 200 mL/hr over 30 Minutes Intravenous Every 24 hours 06/19/23 1928 06/26/23 0748       Subjective: Seen and examined at bedside and she is still feeling very fatigued.  Denied any complaints of pain or nausea or vomiting.  Was complaining of some pain from her IJ TDC.  No other concerns or complaints at this time.  Objective: Vitals:   06/30/23 0718 06/30/23 2042 07/01/23 0420 07/01/23 0843  BP: 116/64 (!) 103/56 107/70 (!) 95/56  Pulse: 72 85 73 68  Resp: 19 18 18 18   Temp:  (!) 97.1 F (36.2 C) (!) 97.3 F (  36.3 C) 97.9 F (36.6 C)  TempSrc:    Oral  SpO2: 99% 91% 100% 98%  Weight:      Height:        Intake/Output Summary (Last 24 hours) at 07/01/2023 1718 Last data filed at 07/01/2023 0500 Gross per 24 hour  Intake 120 ml  Output 450 ml  Net -330 ml   Filed Weights   06/26/23 0500 06/26/23 1951 06/27/23 0701  Weight: 82.6 kg 81.6 kg 79.8 kg   Examination: Physical Exam:  Constitutional: WN/WD obese elderly AAF in no acute distress but does appear very lethargic and tired Respiratory: Diminished to auscultation bilaterally, no wheezing, rales, rhonchi or crackles. Normal respiratory effort and patient is not tachypenic. No accessory muscle use.  Unlabored breathing Cardiovascular: RRR, no murmurs / rubs / gallops. S1 and S2 auscultated.  Minimal extremity edema Abdomen: Soft, non-tender, distended secondary to body habitus. Bowel sounds positive.  GU: Deferred. Musculoskeletal: No clubbing / cyanosis of digits/nails. No joint deformity upper and lower extremities.   Skin: No rashes, lesions, ulcers on limited skin evaluation but has a right IJ TDC. No induration; Warm and dry.  Neurologic: CN 2-12 grossly intact with no focal deficits. Romberg sign and cerebellar reflexes not assessed.  Psychiatric: Normal judgment and insight. Alert and oriented x 3. Normal mood and appropriate affect.   Data Reviewed: I have personally reviewed following labs and imaging studies  CBC: Recent Labs  Lab 06/27/23 0539 06/28/23 0522 06/29/23 0940 06/30/23 0543 07/01/23 1036  WBC 14.0* 14.4* 13.9* 15.3* 15.1*  NEUTROABS  --   --  10.5* 12.9* 11.4*  HGB 13.9 13.7 13.8 13.6 14.2  HCT 39.0 38.5 40.0 39.0 41.1  MCV 83.7 84.2 87.0 86.7 88.4  PLT 107* 116* 108* 130* 120*   Basic Metabolic Panel: Recent Labs  Lab 06/27/23 0539 06/28/23 0522 06/29/23 0451 06/29/23 0940 06/30/23 0543 07/01/23 0652  NA 139 139 139  --  140 141  K 2.9* 3.1* 3.2*  --  4.2 4.2  CL 105 105 106  --  109 110  CO2 23 23 22   --  23 21*  GLUCOSE 97 152* 133*  --  102* 132*  BUN 50* 61* 73*  --  76* 72*  CREATININE 3.46* 3.25* 3.30*  --  2.66* 2.45*  CALCIUM  8.2* 8.2* 8.2*  --  8.6* 8.6*  MG  --   --   --  1.9  --  1.9  PHOS 5.6* 5.0* 5.2*  --  5.6* 4.2   GFR: Estimated Creatinine Clearance: 18.8 mL/min (A) (by C-G formula based on SCr of 2.45 mg/dL (H)). Liver Function Tests: Recent Labs  Lab 06/28/23 0522 06/29/23 0451 06/29/23 0940 06/30/23 0543 07/01/23 0652  AST  --   --  927*  --  897*  ALT  --   --  448*  --  506*  ALKPHOS  --   --  262*  --  248*  BILITOT  --   --  4.0*  --  3.9*  PROT  --   --  5.5*  --  5.4*  ALBUMIN 1.9* 1.7* 1.8* 1.8* 1.8*   No results for input(s): LIPASE, AMYLASE in the last 168 hours. No results for input(s): AMMONIA in the last 168 hours. Coagulation Profile: Recent Labs  Lab 06/27/23 0539  INR 1.4*   Cardiac Enzymes: No results for input(s): CKTOTAL, CKMB, CKMBINDEX, TROPONINI in the last 168 hours. BNP (last 3  results) No results for input(s):  PROBNP in the last 8760 hours. HbA1C: No results for input(s): HGBA1C in the last 72 hours. CBG: No results for input(s): GLUCAP in the last 168 hours. Lipid Profile: No results for input(s): CHOL, HDL, LDLCALC, TRIG, CHOLHDL, LDLDIRECT in the last 72 hours. Thyroid  Function Tests: No results for input(s): TSH, T4TOTAL, FREET4, T3FREE, THYROIDAB in the last 72 hours. Anemia Panel: No results for input(s): VITAMINB12, FOLATE, FERRITIN, TIBC, IRON, RETICCTPCT in the last 72 hours. Sepsis Labs: No results for input(s): PROCALCITON, LATICACIDVEN in the last 168 hours.  Recent Results (from the past 240 hours)  MRSA Next Gen by PCR, Nasal     Status: None   Collection Time: 06/29/23  8:21 AM   Specimen: Nasal Mucosa; Nasal Swab  Result Value Ref Range Status   MRSA by PCR Next Gen NOT DETECTED NOT DETECTED Final    Comment: (NOTE) The GeneXpert MRSA Assay (FDA approved for NASAL specimens only), is one component of a comprehensive MRSA colonization surveillance program. It is not intended to diagnose MRSA infection nor to guide or monitor treatment for MRSA infections. Test performance is not FDA approved in patients less than 49 years old. Performed at [facility], [address], [place], [place] [address]     Radiology Studies: US  Abdomen Limited RUQ (LIVER/GB) Result Date: 06/29/2023 CLINICAL DATA:  Abnormal LFTs EXAM: ULTRASOUND ABDOMEN LIMITED RIGHT UPPER QUADRANT COMPARISON:  None Available. FINDINGS: Gallbladder: No gallstones or wall thickening visualized. No sonographic Murphy sign noted by sonographer. Common bile duct: Diameter: 5.5 mm Liver: Mildly increased in echogenicity consistent with fatty infiltration. No focal mass is seen. Portal vein is patent on color Doppler imaging with normal direction of blood flow towards the liver. Other: None. IMPRESSION: Fatty liver.  No other focal  abnormality is seen. Electronically Signed   By: Oneil Devonshire M.D.   On: 06/29/2023 20:29   Scheduled Meds:  Chlorhexidine  Gluconate Cloth  6 each Topical Q0600   Chlorhexidine  Gluconate Cloth  6 each Topical Q0600   docusate sodium   100 mg Oral BID   dorzolamide -timolol   1 drop Both Eyes BID   furosemide   40 mg Oral BID   latanoprost   1 drop Both Eyes QHS   potassium chloride   20 mEq Oral BID   Continuous Infusions:   LOS: 13 days   Alejandro Marker, DO [facility] Available via Epic secure chat 7am-7pm After these hours, please refer to coverage provider listed on [email or web address] 07/01/2023, 5:18 PM

## 2023-07-01 NOTE — TOC Progression Note (Signed)
 Transition of Care [facility]) - Progression Note    Patient Details  Name: Marissa Orozco MRN: [id number] Date of Birth: 17-May-1945  Transition of Care [facility]) CM/SW Contact  Tom-Johnson, Gwenda Heiner Daphne, RN Phone Number: 07/01/2023, 3:00 PM  Clinical Narrative:     CIR has a bed to admit on Sunday 07/03/23, awaiting Renal clearance.   TOC will continue to follow as patient progresses with care towards discharge.        Expected Discharge Plan and Services                                               Social Determinants of Health (SDOH) Interventions SDOH Screenings   Food Insecurity: No Food Insecurity (06/17/2023)  Housing: Low Risk  (06/17/2023)  Transportation Needs: No Transportation Needs (06/17/2023)  Utilities: Not At Risk (06/17/2023)  Alcohol Screen: Low Risk  (02/12/2023)  Depression (PHQ2-9): Low Risk  (06/02/2023)  Financial Resource Strain: Low Risk  (02/12/2023)  Physical Activity: Sufficiently Active (02/12/2023)  Social Connections: Moderately Isolated (06/17/2023)  Stress: No Stress Concern Present (02/12/2023)  Tobacco Use: Low Risk  (06/17/2023)  Health Literacy: Adequate Health Literacy (09/15/2022)    Readmission Risk Interventions    06/24/2023    1:10 PM  Readmission Risk Prevention Plan  Transportation Screening Complete  PCP or Specialist Appt within 5-7 Days Complete  Home Care Screening Complete  Medication Review (RN CM) Referral to Pharmacy

## 2023-07-01 NOTE — Progress Notes (Signed)
 Inpatient Rehab Admissions Coordinator:    I have a bed available for this pt to tentatively admit to CIR on Sunday (6/29) and Dr. Sherrill in agreement pending clearance from renal service.  Rehab MD (Dr. Urbano) to assess pt and confirm admission on Sunday.  Floor RN can call CIR at [phone] for report after 12pm.  I will let pt/family and case manager know.   Reche Lowers, PT, DPT Admissions Coordinator [phone] 07/01/23  11:53 AM

## 2023-07-01 NOTE — Progress Notes (Signed)
 Patient ID: Marissa Orozco, female   DOB: 1945/10/16, 78 y.o.   MRN: [id number] S: Still complaining of weakness and fatigue.  Being evaluated for CIR. O:BP (!) 95/56 (BP Location: Right Arm)   Pulse 68   Temp 97.9 F (36.6 C) (Oral)   Resp 18   Ht 5' 3.5 (1.613 m)   Wt 79.8 kg   SpO2 98%   BMI 30.68 kg/m   Intake/Output Summary (Last 24 hours) at 07/01/2023 1256 Last data filed at 07/01/2023 0500 Gross per 24 hour  Intake 120 ml  Output 450 ml  Net -330 ml   Intake/Output: I/O last 3 completed shifts: In: 360 [P.O.:360] Out: 850 [Urine:850]  Intake/Output this shift:  No intake/output data recorded. Weight change:  Gen: NAD CVS: RRR Resp:CTA Abd: +BS, soft, NT/ND Ext: minimal pretibial edema  Recent Labs  Lab 06/25/23 0807 06/26/23 0533 06/27/23 0539 06/28/23 0522 06/29/23 0451 06/29/23 0940 06/30/23 0543 07/01/23 0652  NA 140 137 139 139 139  --  140 141  K 2.7* 2.5* 2.9* 3.1* 3.2*  --  4.2 4.2  CL 107 106 105 105 106  --  109 110  CO2 21* 25 23 23 22   --  23 21*  GLUCOSE 86 100* 97 152* 133*  --  102* 132*  BUN 34* 44* 50* 61* 73*  --  76* 72*  CREATININE 3.12* 3.64* 3.46* 3.25* 3.30*  --  2.66* 2.45*  ALBUMIN 2.1* 1.9* 1.9* 1.9* 1.7* 1.8* 1.8* 1.8*  CALCIUM  8.0* 7.3* 8.2* 8.2* 8.2*  --  8.6* 8.6*  PHOS 4.2 4.8* 5.6* 5.0* 5.2*  --  5.6* 4.2  AST  --   --   --   --   --  927*  --  897*  ALT  --   --   --   --   --  448*  --  506*   Liver Function Tests: Recent Labs  Lab 06/29/23 0940 06/30/23 0543 07/01/23 0652  AST 927*  --  897*  ALT 448*  --  506*  ALKPHOS 262*  --  248*  BILITOT 4.0*  --  3.9*  PROT 5.5*  --  5.4*  ALBUMIN 1.8* 1.8* 1.8*   No results for input(s): LIPASE, AMYLASE in the last 168 hours. No results for input(s): AMMONIA in the last 168 hours. CBC: Recent Labs  Lab 06/27/23 0539 06/28/23 0522 06/29/23 0940 06/30/23 0543 07/01/23 1036  WBC 14.0* 14.4* 13.9* 15.3* 15.1*  NEUTROABS  --   --  10.5* 12.9* 11.4*  HGB  13.9 13.7 13.8 13.6 14.2  HCT 39.0 38.5 40.0 39.0 41.1  MCV 83.7 84.2 87.0 86.7 88.4  PLT 107* 116* 108* 130* 120*   Cardiac Enzymes: No results for input(s): CKTOTAL, CKMB, CKMBINDEX, TROPONINI in the last 168 hours. CBG: No results for input(s): GLUCAP in the last 168 hours.  Iron Studies: No results for input(s): IRON, TIBC, TRANSFERRIN, FERRITIN in the last 72 hours. Studies/Results: US  Abdomen Limited RUQ (LIVER/GB) Result Date: 06/29/2023 CLINICAL DATA:  Abnormal LFTs EXAM: ULTRASOUND ABDOMEN LIMITED RIGHT UPPER QUADRANT COMPARISON:  None Available. FINDINGS: Gallbladder: No gallstones or wall thickening visualized. No sonographic Murphy sign noted by sonographer. Common bile duct: Diameter: 5.5 mm Liver: Mildly increased in echogenicity consistent with fatty infiltration. No focal mass is seen. Portal vein is patent on color Doppler imaging with normal direction of blood flow towards the liver. Other: None. IMPRESSION: Fatty liver.  No other focal abnormality is seen. Electronically  Signed   By: Oneil Devonshire M.D.   On: 06/29/2023 20:29    Chlorhexidine  Gluconate Cloth  6 each Topical Q0600   Chlorhexidine  Gluconate Cloth  6 each Topical Q0600   docusate sodium   100 mg Oral BID   dorzolamide -timolol   1 drop Both Eyes BID   ezetimibe   10 mg Oral Daily   furosemide   40 mg Intravenous Q12H   latanoprost   1 drop Both Eyes QHS   potassium chloride   20 mEq Oral BID    BMET    Component Value Date/Time   NA 141 07/01/2023 0652   NA 141 06/02/2023 1039   K 4.2 07/01/2023 0652   CL 110 07/01/2023 0652   CO2 21 (L) 07/01/2023 0652   GLUCOSE 132 (H) 07/01/2023 0652   BUN 72 (H) 07/01/2023 0652   BUN 15 06/02/2023 1039   CREATININE 2.45 (H) 07/01/2023 0652   CALCIUM  8.6 (L) 07/01/2023 0652   GFRNONAA 20 (L) 07/01/2023 0652   GFRAA 89 10/18/2019 0913   CBC    Component Value Date/Time   WBC 15.1 (H) 07/01/2023 1036   RBC 4.65 07/01/2023 1036   HGB 14.2  07/01/2023 1036   HGB 14.2 02/09/2022 0913   HCT 41.1 07/01/2023 1036   HCT 41.9 02/09/2022 0913   PLT 120 (L) 07/01/2023 1036   PLT 314 02/09/2022 0913   MCV 88.4 07/01/2023 1036   MCV 90 02/09/2022 0913   MCH 30.5 07/01/2023 1036   MCHC 34.5 07/01/2023 1036   RDW 22.0 (H) 07/01/2023 1036   RDW 13.3 02/09/2022 0913   LYMPHSABS 2.1 07/01/2023 1036   LYMPHSABS 1.5 05/25/2018 1029   MONOABS 1.1 (H) 07/01/2023 1036   EOSABS 0.2 07/01/2023 1036   EOSABS 0.1 05/25/2018 1029   BASOSABS 0.1 07/01/2023 1036   BASOSABS 0.1 05/25/2018 1029    Assessment/ Plan:   # AKI - Ischemic and prerenal insults - hypotension at home, ARB use, and  UTI.  But with concurrent microscopic hematuria and subnephrotic range proteinuria.  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  Will check PR-3 and MPO assays. - DSDNA, dsDNA, antiSM neg; ANCA P, - SPEP no M-paraprotein, K/L high but from renal failure - s/p kidney biopsy 06/27/23- which revealed chronic changes, AIN, and some TMA, no immune complex mediated or crescentic GN.  She denies any new medications prior to admission and does not take any PPI's.  No recent antibiotics.   - Feeling worse on 6/21 with rising BUN/Cr ->  s/p RIJ TDC and HD #1 early on 6/21 - UOP and Scr have markedly improved over the last 48 hours.  Continue to hold off on HD for now and continue to follow for ongoing renal recovery.   Scr down to 2.45 - keep TDC in place for now but will need to be removed early next week if Scr continues to improve.    # Abnormal LFT's  - RUQ US  with fatty liver disease - she was also on amlodipine  and atorvastatin  prior to admission but atorvastatin  was stopped on 06/20/23. - off of acetaminophen  - hepatitis panel negative.  May benefit from GI evaluation - will check iron, ferritin to r/o hemachromatosis   # Hypertension - As above, remain off of ARB for now  in the setting of AKI - Noted report of hypotension prior to admission - Acceptable on current regimen  -  added some lasix  given inc UOP and volume overload with good response - will change to po furosemide  as Bp's are soft   # Proteinuria  - up/cr ratio 2530 mg/g  - GN w/u as above - off of ARB for AKI      # Microscopic hematuria  - Note UTI which may explain - abx per primary team  - GN w/u as above     # UTI - E coli UTI - Ceftriaxone  daily started on 6/15 - will need 1 week of therapy for complicated UTI with hematuria    # Pre-DM - Note A1c 6.2 on 06/02/23 - Optimizing pre-DM can reduce risk of CKD progression   # Angiomyolipoma - Probable angiomyolipoma on renal US  - Radiology recommended a follow-up renal ultrasound in 6 months   # Hypokalemia - replete-  also have stopped the sodium bicarb-  continue with po supplementation   # Deconditioning - for CIR or SNF placement for ongoing PT/OT. - stable from a renal standpoint for CIR    Fairy RONAL Sellar, MD [facility] [phone]

## 2023-07-01 NOTE — Progress Notes (Signed)
 Mobility Specialist Progress Note:    07/01/23 0900  Mobility  Activity Stood at bedside  Level of Assistance Minimal assist, patient does 75% or more  Assistive Device Front wheel walker  Distance Ambulated (ft) 5 ft  Activity Response Tolerated fair  Mobility Referral Yes  Mobility visit 1 Mobility  Mobility Specialist Start Time (ACUTE ONLY) [id number]  Mobility Specialist Stop Time (ACUTE ONLY) 0853  Mobility Specialist Time Calculation (min) (ACUTE ONLY) 7 min   Pt received in bed and agreeable w/ encouragement. Required modA for bed mobility and minA for STS. Took lateral steps along bedside. Session limited d/t fatigue. Pt left in bed with call bell and all needs met. Bed alarm on.  D'Vante Nicholaus Mobility Specialist Please contact via Special educational needs teacher or Rehab office at [phone]

## 2023-07-02 ENCOUNTER — Inpatient Hospital Stay (HOSPITAL_COMMUNITY)

## 2023-07-02 DIAGNOSIS — E785 Hyperlipidemia, unspecified: Secondary | ICD-10-CM | POA: Diagnosis not present

## 2023-07-02 DIAGNOSIS — I10 Essential (primary) hypertension: Secondary | ICD-10-CM | POA: Diagnosis not present

## 2023-07-02 DIAGNOSIS — K838 Other specified diseases of biliary tract: Secondary | ICD-10-CM

## 2023-07-02 DIAGNOSIS — Z8673 Personal history of transient ischemic attack (TIA), and cerebral infarction without residual deficits: Secondary | ICD-10-CM | POA: Diagnosis not present

## 2023-07-02 DIAGNOSIS — R748 Abnormal levels of other serum enzymes: Secondary | ICD-10-CM

## 2023-07-02 DIAGNOSIS — Z992 Dependence on renal dialysis: Secondary | ICD-10-CM

## 2023-07-02 DIAGNOSIS — K862 Cyst of pancreas: Secondary | ICD-10-CM

## 2023-07-02 DIAGNOSIS — N179 Acute kidney failure, unspecified: Secondary | ICD-10-CM | POA: Diagnosis not present

## 2023-07-02 DIAGNOSIS — R933 Abnormal findings on diagnostic imaging of other parts of digestive tract: Secondary | ICD-10-CM

## 2023-07-02 DIAGNOSIS — R760 Raised antibody titer: Secondary | ICD-10-CM

## 2023-07-02 LAB — CBC WITH DIFFERENTIAL/PLATELET
Abs Immature Granulocytes: 0.1 10*3/uL — ABNORMAL HIGH (ref 0.00–0.07)
Basophils Absolute: 0.1 10*3/uL (ref 0.0–0.1)
Basophils Relative: 1 %
Eosinophils Absolute: 0.2 10*3/uL (ref 0.0–0.5)
Eosinophils Relative: 1 %
HCT: 38.4 % (ref 36.0–46.0)
Hemoglobin: 13.1 g/dL (ref 12.0–15.0)
Immature Granulocytes: 1 %
Lymphocytes Relative: 15 %
Lymphs Abs: 2.3 10*3/uL (ref 0.7–4.0)
MCH: 30.7 pg (ref 26.0–34.0)
MCHC: 34.1 g/dL (ref 30.0–36.0)
MCV: 89.9 fL (ref 80.0–100.0)
Monocytes Absolute: 1.7 10*3/uL — ABNORMAL HIGH (ref 0.1–1.0)
Monocytes Relative: 11 %
Neutro Abs: 11.2 10*3/uL — ABNORMAL HIGH (ref 1.7–7.7)
Neutrophils Relative %: 71 %
Platelets: 139 10*3/uL — ABNORMAL LOW (ref 150–400)
RBC: 4.27 MIL/uL (ref 3.87–5.11)
RDW: 22.1 % — ABNORMAL HIGH (ref 11.5–15.5)
WBC: 15.5 10*3/uL — ABNORMAL HIGH (ref 4.0–10.5)
nRBC: 0 % (ref 0.0–0.2)

## 2023-07-02 LAB — COMPREHENSIVE METABOLIC PANEL WITH GFR
ALT: 520 U/L — ABNORMAL HIGH (ref 0–44)
AST: 855 U/L — ABNORMAL HIGH (ref 15–41)
Albumin: 1.8 g/dL — ABNORMAL LOW (ref 3.5–5.0)
Alkaline Phosphatase: 221 U/L — ABNORMAL HIGH (ref 38–126)
Anion gap: 9 (ref 5–15)
BUN: 69 mg/dL — ABNORMAL HIGH (ref 8–23)
CO2: 21 mmol/L — ABNORMAL LOW (ref 22–32)
Calcium: 8.8 mg/dL — ABNORMAL LOW (ref 8.9–10.3)
Chloride: 115 mmol/L — ABNORMAL HIGH (ref 98–111)
Creatinine, Ser: 2 mg/dL — ABNORMAL HIGH (ref 0.44–1.00)
GFR, Estimated: 25 mL/min — ABNORMAL LOW (ref >60)
Glucose, Bld: 181 mg/dL — ABNORMAL HIGH (ref 70–99)
Potassium: 4.1 mmol/L (ref 3.5–5.1)
Sodium: 145 mmol/L (ref 135–145)
Total Bilirubin: 4.1 mg/dL — ABNORMAL HIGH (ref 0.0–1.2)
Total Protein: 5.6 g/dL — ABNORMAL LOW (ref 6.5–8.1)

## 2023-07-02 LAB — CMV ANTIBODY, IGG (EIA): CMV Ab - IgG: >10 U/mL — ABNORMAL HIGH (ref 0.00–0.59)

## 2023-07-02 LAB — EPSTEIN-BARR VIRUS (EBV) ANTIBODY PROFILE
EBV NA IgG: >600 U/mL — ABNORMAL HIGH (ref 0.0–17.9)
EBV VCA IgG: >600 U/mL — ABNORMAL HIGH (ref 0.0–17.9)
EBV VCA IgM: 56 U/mL — ABNORMAL HIGH (ref 0.0–35.9)

## 2023-07-02 LAB — MAGNESIUM: Magnesium: 1.8 mg/dL (ref 1.7–2.4)

## 2023-07-02 LAB — HSV(HERPES SIMPLEX VRS) I + II AB-IGG
HSV 1 Glycoprotein G Ab, IgG: NONREACTIVE
HSV 2 Glycoprotein G Ab, IgG: NONREACTIVE

## 2023-07-02 LAB — CMV IGM: CMV IgM: <30 [AU]/ml (ref 0.0–29.9)

## 2023-07-02 LAB — PROTIME-INR
INR: 1.5 — ABNORMAL HIGH (ref 0.8–1.2)
Prothrombin Time: 18.7 s — ABNORMAL HIGH (ref 11.4–15.2)

## 2023-07-02 LAB — ANTI-SMOOTH MUSCLE ANTIBODY, IGG: F-Actin IgG: 81 U — ABNORMAL HIGH (ref 0–19)

## 2023-07-02 LAB — PHOSPHORUS: Phosphorus: 4.3 mg/dL (ref 2.5–4.6)

## 2023-07-02 LAB — IGG: IgG (Immunoglobin G), Serum: 2038 mg/dL — ABNORMAL HIGH (ref 586–1602)

## 2023-07-02 NOTE — Consult Note (Addendum)
 Consultation Note   Referring Provider:  [facility] PCP: Jarold Medici, MD Primary Gastroenterologist:    Aloha Finner, MD      Reason for Consultation: Abnormal LFTs DOA: 06/17/2023         Hospital Day: 40   ASSESSMENT    78 year old female with a history of colon polyps, GERD, glaucoma, hypertension, CVA , kidney stones, osteoporosis.  Admitted several days ago with AKI.    Markedly elevated LFTs ( mixed pattern) / abnormal autoimmune studies with positive ANA and markedly elevated ASMA / diffusely abnormal appearing liver on MRI Intra / extrahepatic biliary duct dilation on MRCP Viral hepatitis panel, CMV, EBV igM positive.  Iron studies are pending  Pancreatic cysts on MRI.  The main pancreatic duct is prominent measuring up to 4 mm. Multiple scattered areas of side branch duct ectasia identified. Multiple larger cysts are also identified predominantly within the head of pancreas. The dominant cyst is centered around the uncinate process measuring 5.2 x 4.6 cm.  This is the first time that patient has ever heard about any pancreas issues  AKI, required HD.  Ischemic and prerenal insults (hypotension at home , ARB use , UTI) but with concurrent microscopic hematuria and subnephrotic range proteinuria. ). Renal biopsy showing chronic changes, AIN, and some TMA  See PMH for additional history  Principal Problem:   AKI (acute kidney injury) (HCC) Active Problems:   HTN (hypertension), malignant   Hyperlipidemia with target low density lipoprotein (LDL) cholesterol less than 70 mg/dL   History of stroke     PLAN:   --She may need a liver biopsy --EBV PCR --checking INR --Will discuss MRI / MRCP results with Dr. Leigh and also our biliary endoscopist.  --Follow-up on iron studies   HPI   Brief GI history  patient was last seen in our office August 2020 for for history of colon polyps and a family  history of colon cancer in both parents.   Interval history Patient has had a lengthy hospital stay.  She was admitted 16 days ago with poor p.o. intake, AKI.  LFTs during this admission found to be markedly elevated in a mixed pattern.  Unclear if liver tests were elevated on admission as she did not have LFTs drawn until 06/29/2023 but they have been normal back in 2021. Her autoimmune markers are markedly elevated and she has abnormal imaging studies.  MRI / MRCP with multiple findings included abnormal heterogeneous appearance of the liver, dilation of CBD and moderate intrahepatic dilation, mild right lobe of liver biliary dilation.  No signs of choledocholithiasis, numerous pancreatic cyst.  See full report below  Aireana has not had any abdominal pain this admission nor any abdominal pain prior to admission.  No nausea or vomiting.  No prior history of liver disease as far as she knows.  She was not taking any herbs/supplements other than a multiple vitamin at home.  No family history of liver disease as far she knows.  She actually has no GI nor general medical complaints  Relevant workup thus far: ASMA elevated at 91 IgG elevated at 2,038 ANA positive  Ferritin , TIBC pending Hepatitis B surface antigen nonreactive, hepatitis B core antibody IgG M non reactive,  hepatitis B surface antibody  347, hepatitis C antibody nonreactive.  CMV antibody IgG greater than 10, CMV IgM 30, HSV 1 IgG and HSV 2 IgG both nonreactive.   Labs and Imaging:  Recent Labs    06/30/23 0543 07/01/23 0652 07/02/23 0712  PROT  --  5.4* 5.6*  ALBUMIN 1.8* 1.8* 1.8*  AST  --  897* 855*  ALT  --  506* 520*  ALKPHOS  --  248* 221*  BILITOT  --  3.9* 4.1*   Recent Labs    06/30/23 0543 07/01/23 1036 07/02/23 0712  WBC 15.3* 15.1* 15.5*  HGB 13.6 14.2 13.1  HCT 39.0 41.1 38.4  MCV 86.7 88.4 89.9  PLT 130* 120* 139*   Recent Labs    06/30/23 0543 07/01/23 0652 07/02/23 0712  NA 140 141 145  K 4.2  4.2 4.1  CL 109 110 115*  CO2 23 21* 21*  GLUCOSE 102* 132* 181*  BUN 76* 72* 69*  CREATININE 2.66* 2.45* 2.00*  CALCIUM  8.6* 8.6* 8.8*     MR 3D Recon At Scanner CLINICAL DATA:  Jaundice.  EXAM: MRI ABDOMEN WITHOUT CONTRAST  (INCLUDING MRCP)  TECHNIQUE: Multiplanar multisequence MR imaging of the abdomen was performed. Heavily T2-weighted images of the biliary and pancreatic ducts were obtained, and three-dimensional MRCP images were rendered by post processing.  COMPARISON:  Ultrasound 06/29/2023  FINDINGS: Lower chest: Small bilateral pleural effusions.  Hepatobiliary: There is abnormal heterogeneous appearance of the liver with multifocal scattered areas of relative increased T2 signal throughout both lobes of liver with increased T2 periportal signal. The gallbladder appears within normal limits. Fusiform dilatation of the common bile duct measures up to 1 cm. There is moderate intrahepatic dilatation to the left lobe. Mild right lobe of liver dilatation. No signs of choledocholithiasis.  Pancreas: The main pancreatic duct is prominent measuring up to 4 mm. Multiple scattered areas of side branch duct ectasia identified. Multiple larger cysts are also identified predominantly within the head of pancreas. The dominant cyst is centered around the uncinate process measuring 5.2 x 4.6 cm, image 19/5. No signs of pancreatic inflammation.  Spleen: Within normal limits in size and appearance. Normal adrenal glands.  Adrenals/Urinary Tract: Normal adrenal glands. No hydronephrosis. Lesion off the anterior cortex of the upper pole of the right kidney measures 0.9 cm, image 36/8. This is isointense on the coronal T2 haste. On the T2 fat sat sequences there is decreased signal within this lesion. Similarly on the out of phase sequences there is relative decreased signal within this lesion. On the unenhanced pre contrast T1 weighted sequence this is relatively hypointense  to the adjacent renal parenchyma.  Stomach/Bowel: Visualized portions within the abdomen are unremarkable.  Vascular/Lymphatic: Normal caliber of the abdominal aorta. Porta hepatic lymph node measures 1.3 cm, image 18/3. No retroperitoneal adenopathy.  Other:  No ascites.  There is edema extending along both flanks.  Musculoskeletal: No suspicious bone lesions identified.  IMPRESSION: 1. Abnormal heterogeneous appearance of the liver with multifocal scattered areas of relative increased T2 signal throughout both lobes of liver with increased T2 periportal signal. Imaging findings are nonspecific likely reflect hepatic inflammation, edema, early infiltrative disease or evolving hepatocellular injury. Underlying infiltrative tumor would be difficult to exclude without the benefit of IV contrast material. 2. Fusiform dilatation of the common bile duct measures up to 1 cm. There is moderate intrahepatic dilatation to the left lobe. Mild right lobe of liver biliary dilatation. No signs of choledocholithiasis. 3. Numerous  cysts identified within the dominant cyst is centered around the uncinate process measuring 5.2 x 4.6 cm. Findings may reflect mucinous cystic neoplasm versus side branch IPMN. Recommend referral to gastroenterology for further management. 4. Enlarged porta hepatic lymph node measures 1.3 cm. 5. Small bilateral pleural effusions. 6. Lesion off the anterior cortex of the upper pole of the right kidney measures 0.9 cm. 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  Electronically Signed   By: Waddell Calk M.D.   On: 07/02/2023 05:59 MR ABDOMEN MRCP WO CONTRAST CLINICAL DATA:  Jaundice.  EXAM: MRI ABDOMEN WITHOUT CONTRAST  (INCLUDING MRCP)  TECHNIQUE: Multiplanar multisequence MR imaging of the abdomen was performed. Heavily T2-weighted images of the biliary and pancreatic ducts  were obtained, and three-dimensional MRCP images were rendered by post processing.  COMPARISON:  Ultrasound 06/29/2023  FINDINGS: Lower chest: Small bilateral pleural effusions.  Hepatobiliary: There is abnormal heterogeneous appearance of the liver with multifocal scattered areas of relative increased T2 signal throughout both lobes of liver with increased T2 periportal signal. The gallbladder appears within normal limits. Fusiform dilatation of the common bile duct measures up to 1 cm. There is moderate intrahepatic dilatation to the left lobe. Mild right lobe of liver dilatation. No signs of choledocholithiasis.  Pancreas: The main pancreatic duct is prominent measuring up to 4 mm. Multiple scattered areas of side branch duct ectasia identified. Multiple larger cysts are also identified predominantly within the head of pancreas. The dominant cyst is centered around the uncinate process measuring 5.2 x 4.6 cm, image 19/5. No signs of pancreatic inflammation.  Spleen: Within normal limits in size and appearance. Normal adrenal glands.  Adrenals/Urinary Tract: Normal adrenal glands. No hydronephrosis. Lesion off the anterior cortex of the upper pole of the right kidney measures 0.9 cm, image 36/8. This is isointense on the coronal T2 haste. On the T2 fat sat sequences there is decreased signal within this lesion. Similarly on the out of phase sequences there is relative decreased signal within this lesion. On the unenhanced pre contrast T1 weighted sequence this is relatively hypointense to the adjacent renal parenchyma.  Stomach/Bowel: Visualized portions within the abdomen are unremarkable.  Vascular/Lymphatic: Normal caliber of the abdominal aorta. Porta hepatic lymph node measures 1.3 cm, image 18/3. No retroperitoneal adenopathy.  Other:  No ascites.  There is edema extending along both flanks.  Musculoskeletal: No suspicious bone lesions  identified.  IMPRESSION: 1. Abnormal heterogeneous appearance of the liver with multifocal scattered areas of relative increased T2 signal throughout both lobes of liver with increased T2 periportal signal. Imaging findings are nonspecific likely reflect hepatic inflammation, edema, early infiltrative disease or evolving hepatocellular injury. Underlying infiltrative tumor would be difficult to exclude without the benefit of IV contrast material. 2. Fusiform dilatation of the common bile duct measures up to 1 cm. There is moderate intrahepatic dilatation to the left lobe. Mild right lobe of liver biliary dilatation. No signs of choledocholithiasis. 3. Numerous cysts identified within the dominant cyst is centered around the uncinate process measuring 5.2 x 4.6 cm. Findings may reflect mucinous cystic neoplasm versus side branch IPMN. Recommend referral to gastroenterology for further management. 4. Enlarged porta hepatic lymph node measures 1.3 cm. 5. Small bilateral pleural effusions. 6. Lesion off the anterior cortex of the upper pole of the right kidney measures 0.9 cm. 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  Electronically Signed  By: Waddell Calk M.D.   On: 07/02/2023 05:59   Pertinent GI Studies   Most recent endoscopic studies  Colonoscopy January 2025 - The examined portion of the ileum was normal. - Three 3 to 5 mm polyps in the transverse colon and in the ascending colon, removed with a cold snare. Resected and retrieved. - One 3 mm polyp in the rectum, removed with a cold snare. Resected and retrieved.  Internal hemorrhoids 1. Surgical [P], colon, transverse and ascending, polyp (3) :       TUBULAR ADENOMA (1) WITHOUT HIGH GRADE DYSPLASIA.       BENIGN COLONIC MUCOSA (2).        2. Surgical [P], colon, rectum, polyp (1) :       COLONIC MUCOSA WITH BENIGN LYMPHOID AGGREGATES.        MULTIPLE ADDITIONAL LEVELS EXAMINED.    Past Medical History:  Diagnosis Date   Allergy 2000   Breast lump    Cataract    Colon polyps    Fibroids    GERD (gastroesophageal reflux disease)    Glaucoma    Heart murmur    Hyperlipidemia 01/05/1984   Hypertension 01/05/1984   Interstitial cystitis 01/04/1997   Kidney stones 01/05/2003   Neuromuscular disorder (HCC)    Osteoporosis    Preop cardiovascular exam 10/25/2022   Sleep apnea 2021   Tonsillitis     Past Surgical History:  Procedure Laterality Date   ABDOMINAL HYSTERECTOMY  1997   BREAST EXCISIONAL BIOPSY Right 07/27/2006   Intraductal Papilloma   BREAST SURGERY  2008   CATARACT EXTRACTION Bilateral 02/2020   other in 05/2020   COLONOSCOPY  2010   Dr Kristie   EYE SURGERY  2022   HYSTEROSCOPY  1993   IR FLUORO GUIDE CV LINE RIGHT  06/24/2023   IR US  GUIDE VASC ACCESS RIGHT  06/24/2023   LAPAROSCOPIC HYSTERECTOMY  1994   MYOMECTOMY  1993   excision of tumor   POLYPECTOMY     TONSILLECTOMY  1953   TUBAL LIGATION  1980    Family History  Problem Relation Age of Onset   Hypertension Mother    Heart disease Mother    Colon cancer Mother 74   Arthritis Mother    Cancer Mother    Coronary artery disease Father        mother, PGM   Stroke Father    Hypertension Father    Colon cancer Father 36   Cancer Father    Colon cancer Maternal Grandmother    Diabetes Other    Breast cancer Neg Hx    Colon polyps Neg Hx    Esophageal cancer Neg Hx    Stomach cancer Neg Hx    Rectal cancer Neg Hx    BRCA 1/2 Neg Hx     Prior to Admission medications   Medication Sig Start Date End Date Taking? Authorizing Provider  amLODipine  (NORVASC ) 5 MG tablet TAKE 1 TABLET BY MOUTH DAILY Patient taking differently: Take 5 mg by mouth at bedtime. 11/22/22  Yes Jarold Medici, MD  aspirin  EC 81 MG tablet Take 81 mg by mouth daily. Swallow whole.   Yes [provider]  atorvastatin  (LIPITOR) 80 MG tablet TAKE 1 TABLET BY  MOUTH DAILY 09/08/22  Yes Jarold Medici, MD  Calcium  Carbonate (CALCIUM  500 PO) Take 500 mg by mouth daily with breakfast.   Yes [provider]  Cholecalciferol (VITAMIN D3) 50 MCG (2000 UT) TABS Take 2,000 Units by  mouth daily with breakfast.   Yes [provider]  dorzolamide -timolol  (COSOPT ) 22.3-6.8 MG/ML ophthalmic solution Place 1 drop into both eyes in the morning and at bedtime. 02/02/20  Yes [provider]  ezetimibe  (ZETIA ) 10 MG tablet Take 1 tablet (10 mg total) by mouth daily. 02/16/23  Yes Jarold Medici, MD  finasteride (PROSCAR) 5 MG tablet Take 5 mg by mouth daily.   Yes [provider]  latanoprost  (XALATAN ) 0.005 % ophthalmic solution Place 1 drop into both eyes at bedtime. 03/17/13  Yes [provider]  valsartan  (DIOVAN ) 160 MG tablet Take 1 tablet (160 mg total) by mouth daily. 02/16/23 02/16/24 Yes Jarold Medici, MD    Current Facility-Administered Medications  Medication Dose Route Frequency Provider Last Rate Last Admin   Chlorhexidine  Gluconate Cloth 2 % PADS 6 each  6 each Topical Q0600 Melia Lynwood ORN, MD   6 each at 07/02/23 0609   Chlorhexidine  Gluconate Cloth 2 % PADS 6 each  6 each Topical Q0600 G[place], Kellie, MD   6 each at 07/01/23 0600   docusate sodium  (COLACE) capsule 100 mg  100 mg Oral BID Rai, Ripudeep K, MD   100 mg at 07/02/23 0940   dorzolamide -timolol  (COSOPT ) 2-0.5 % ophthalmic solution 1 drop  1 drop Both Eyes BID Rai, Ripudeep K, MD   1 drop at 07/02/23 0940   furosemide  (LASIX ) tablet 40 mg  40 mg Oral BID Rayburn Pac, MD   40 mg at 07/02/23 0940   latanoprost  (XALATAN ) 0.005 % ophthalmic solution 1 drop  1 drop Both Eyes QHS Rai, Ripudeep K, MD   1 drop at 07/01/23 2200   ondansetron  (ZOFRAN ) injection 4 mg  4 mg Intravenous Q6H PRN Rai, Ripudeep K, MD       Oral care mouth rinse  15 mL Mouth Rinse PRN Odell Celinda Balo, MD       polyethylene glycol (MIRALAX  / GLYCOLAX ) packet 17 g  17 g Oral  Daily PRN Rai, Ripudeep K, MD   17 g at 06/30/23 0840   potassium chloride  SA (KLOR-CON  M) CR tablet 20 mEq  20 mEq Oral BID Sheikh, Omair Latif, DO   20 mEq at 07/02/23 0940    Allergies as of 06/17/2023 - Review Complete 06/17/2023  Allergen Reaction Noted   Hydrochlorothiazide Itching 05/29/2010    Social History   Socioeconomic History   Marital status: Married    Spouse name: Not on file   Number of children: 2   Years of education: Not on file   Highest education level: Doctorate  Occupational History   Occupation: retired   Occupation: retired  Tobacco Use   Smoking status: Never   Smokeless tobacco: Never  Vaping Use   Vaping status: Never Used  Substance and Sexual Activity   Alcohol use: Yes    Alcohol/week: 5.0 standard drinks of alcohol    Types: 5 Glasses of wine per week   Drug use: No   Sexual activity: Not Currently  Other Topics Concern   Not on file  Social History Narrative   Not on file   Social Drivers of Health   Financial Resource Strain: Low Risk  (02/12/2023)   Overall Financial Resource Strain (CARDIA)    Difficulty of Paying Living Expenses: Not hard at all  Food Insecurity: No Food Insecurity (06/17/2023)   Hunger Vital Sign    Worried About Running Out of Food in the Last Year: Never true    Ran Out of Food  in the Last Year: Never true  Transportation Needs: No Transportation Needs (06/17/2023)   PRAPARE - Administrator, Civil Service (Medical): No    Lack of Transportation (Non-Medical): No  Physical Activity: Sufficiently Active (02/12/2023)   Exercise Vital Sign    Days of Exercise per Week: 5 days    Minutes of Exercise per Session: 40 min  Stress: No Stress Concern Present (02/12/2023)   [employer] of Occupational Health - Occupational Stress Questionnaire    Feeling of Stress : Not at all  Social Connections: Moderately Isolated (06/17/2023)   Social Connection and Isolation Panel    Frequency of Communication with  Friends and Family: More than three times a week    Frequency of Social Gatherings with Friends and Family: Once a week    Attends Religious Services: Never    Database administrator or Organizations: No    Attends Banker Meetings: Never    Marital Status: Married  Catering manager Violence: Not At Risk (06/17/2023)   Humiliation, Afraid, Rape, and Kick questionnaire    Fear of Current or Ex-Partner: No    Emotionally Abused: No    Physically Abused: No    Sexually Abused: No     Code Status   Code Status: Full Code  Review of Systems: All systems reviewed and negative except where noted in HPI.  Physical Exam: Vital signs in last 24 hours: Temp:  [97.1 F (36.2 C)-98.6 F (37 C)] 97.9 F (36.6 C) (06/28 0826) Pulse Rate:  [69-71] 69 (06/28 0826) Resp:  [18] 18 (06/28 0516) BP: (95-111)/(59-72) 95/60 (06/28 0826) SpO2:  [94 %-99 %] 99 % (06/28 0826) Last BM Date : 06/30/23  General:  Pleasant female in NAD.  Husband in room Psych:  Cooperative. Normal mood and affect Eyes: Pupils equal Ears:  Normal auditory acuity Nose: No deformity, discharge or lesions Neck:  Supple, no masses felt Lungs:  Clear to auscultation.  Heart:  Regular rate, regular rhythm.  Abdomen:  Soft, nondistended, nontender, active bowel sounds, no masses felt Rectal :  Deferred Msk: Symmetrical without gross deformities.  Neurologic:  Alert, oriented, grossly normal neurologically Extremities : No edema Skin:  Intact without significant lesions.    Intake/Output from previous day: 06/27 0701 - 06/28 0700 In: 120 [P.O.:120] Out: 300 [Urine:300] Intake/Output this shift:  Total I/O In: 240 [P.O.:240] Out: 300 [Urine:300]   Vina Dasen, NP-C   07/02/2023, 12:06 PM

## 2023-07-02 NOTE — Plan of Care (Signed)
   Problem: Health Behavior/Discharge Planning: Goal: Ability to manage health-related needs will improve Outcome: Progressing

## 2023-07-02 NOTE — Progress Notes (Signed)
 Patient ID: Marissa Orozco, female   DOB: Jan 26, 1945, 78 y.o.   MRN: [id number] S: no new complaints O:BP 95/60 (BP Location: Right Arm)   Pulse 69   Temp 97.9 F (36.6 C) (Oral)   Resp 18   Ht 5' 3.5 (1.613 m)   Wt 79.8 kg   SpO2 99%   BMI 30.68 kg/m   Intake/Output Summary (Last 24 hours) at 07/02/2023 1014 Last data filed at 07/02/2023 0945 Gross per 24 hour  Intake 360 ml  Output 600 ml  Net -240 ml   Intake/Output: I/O last 3 completed shifts: In: 240 [P.O.:240] Out: 750 [Urine:750]  Intake/Output this shift:  Total I/O In: 240 [P.O.:240] Out: 300 [Urine:300] Weight change:  Gen: NAD CVS: RRR Resp: CTA Abd: +BS, soft, NT/nD Ext: no edema  Recent Labs  Lab 06/26/23 0533 06/27/23 0539 06/28/23 0522 06/29/23 0451 06/29/23 0940 06/30/23 0543 07/01/23 0652 07/02/23 0712  NA 137 139 139 139  --  140 141 145  K 2.5* 2.9* 3.1* 3.2*  --  4.2 4.2 4.1  CL 106 105 105 106  --  109 110 115*  CO2 25 23 23 22   --  23 21* 21*  GLUCOSE 100* 97 152* 133*  --  102* 132* 181*  BUN 44* 50* 61* 73*  --  76* 72* 69*  CREATININE 3.64* 3.46* 3.25* 3.30*  --  2.66* 2.45* 2.00*  ALBUMIN 1.9* 1.9* 1.9* 1.7* 1.8* 1.8* 1.8* 1.8*  CALCIUM  7.3* 8.2* 8.2* 8.2*  --  8.6* 8.6* 8.8*  PHOS 4.8* 5.6* 5.0* 5.2*  --  5.6* 4.2 4.3  AST  --   --   --   --  927*  --  897* 855*  ALT  --   --   --   --  448*  --  506* 520*   Liver Function Tests: Recent Labs  Lab 06/29/23 0940 06/30/23 0543 07/01/23 0652 07/02/23 0712  AST 927*  --  897* 855*  ALT 448*  --  506* 520*  ALKPHOS 262*  --  248* 221*  BILITOT 4.0*  --  3.9* 4.1*  PROT 5.5*  --  5.4* 5.6*  ALBUMIN 1.8* 1.8* 1.8* 1.8*   No results for input(s): LIPASE, AMYLASE in the last 168 hours. No results for input(s): AMMONIA in the last 168 hours. CBC: Recent Labs  Lab 06/28/23 0522 06/28/23 0522 06/29/23 0940 06/30/23 0543 07/01/23 1036 07/02/23 0712  WBC 14.4*  --  13.9* 15.3* 15.1* 15.5*  NEUTROABS  --    < > 10.5*  12.9* 11.4* 11.2*  HGB 13.7  --  13.8 13.6 14.2 13.1  HCT 38.5  --  40.0 39.0 41.1 38.4  MCV 84.2  --  87.0 86.7 88.4 89.9  PLT 116*  --  108* 130* 120* 139*   < > = values in this interval not displayed.   Cardiac Enzymes: No results for input(s): CKTOTAL, CKMB, CKMBINDEX, TROPONINI in the last 168 hours. CBG: No results for input(s): GLUCAP in the last 168 hours.  Iron Studies: No results for input(s): IRON, TIBC, TRANSFERRIN, FERRITIN in the last 72 hours. Studies/Results: MR ABDOMEN MRCP WO CONTRAST Result Date: 07/02/2023 CLINICAL DATA:  Jaundice. EXAM: MRI ABDOMEN WITHOUT CONTRAST  (INCLUDING MRCP) TECHNIQUE: Multiplanar multisequence MR imaging of the abdomen was performed. Heavily T2-weighted images of the biliary and pancreatic ducts were obtained, and three-dimensional MRCP images were rendered by post processing. COMPARISON:  Ultrasound 06/29/2023 FINDINGS: Lower chest: Small bilateral pleural  effusions. Hepatobiliary: There is abnormal heterogeneous appearance of the liver with multifocal scattered areas of relative increased T2 signal throughout both lobes of liver with increased T2 periportal signal. The gallbladder appears within normal limits. Fusiform dilatation of the common bile duct measures up to 1 cm. There is moderate intrahepatic dilatation to the left lobe. Mild right lobe of liver dilatation. No signs of choledocholithiasis. Pancreas: The main pancreatic duct is prominent measuring up to 4 mm. Multiple scattered areas of side branch duct ectasia identified. Multiple larger cysts are also identified predominantly within the head of pancreas. The dominant cyst is centered around the uncinate process measuring 5.2 x 4.6 cm, image 19/5. No signs of pancreatic inflammation. Spleen: Within normal limits in size and appearance. Normal adrenal glands. Adrenals/Urinary Tract: Normal adrenal glands. No hydronephrosis. Lesion off the anterior cortex of the upper pole  of the right kidney measures 0.9 cm, image 36/8. This is isointense on the coronal T2 haste. On the T2 fat sat sequences there is decreased signal within this lesion. Similarly on the out of phase sequences there is relative decreased signal within this lesion. On the unenhanced pre contrast T1 weighted sequence this is relatively hypointense to the adjacent renal parenchyma. Stomach/Bowel: Visualized portions within the abdomen are unremarkable. Vascular/Lymphatic: Normal caliber of the abdominal aorta. Porta hepatic lymph node measures 1.3 cm, image 18/3. No retroperitoneal adenopathy. Other:  No ascites.  There is edema extending along both flanks. Musculoskeletal: No suspicious bone lesions identified. IMPRESSION: 1. Abnormal heterogeneous appearance of the liver with multifocal scattered areas of relative increased T2 signal throughout both lobes of liver with increased T2 periportal signal. Imaging findings are nonspecific likely reflect hepatic inflammation, edema, early infiltrative disease or evolving hepatocellular injury. Underlying infiltrative tumor would be difficult to exclude without the benefit of IV contrast material. 2. Fusiform dilatation of the common bile duct measures up to 1 cm. There is moderate intrahepatic dilatation to the left lobe. Mild right lobe of liver biliary dilatation. No signs of choledocholithiasis. 3. Numerous cysts identified within the dominant cyst is centered around the uncinate process measuring 5.2 x 4.6 cm. Findings may reflect mucinous cystic neoplasm versus side branch IPMN. Recommend referral to gastroenterology for further management. 4. Enlarged porta hepatic lymph node measures 1.3 cm. 5. Small bilateral pleural effusions. 6. Lesion off the anterior cortex of the upper pole of the right kidney measures 0.9 cm. 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 Electronically Signed   By: Waddell Calk M.D.   On: 07/02/2023 05:59   MR 3D Recon At Scanner Result Date: 07/02/2023 CLINICAL DATA:  Jaundice. EXAM: MRI ABDOMEN WITHOUT CONTRAST  (INCLUDING MRCP) TECHNIQUE: Multiplanar multisequence MR imaging of the abdomen was performed. Heavily T2-weighted images of the biliary and pancreatic ducts were obtained, and three-dimensional MRCP images were rendered by post processing. COMPARISON:  Ultrasound 06/29/2023 FINDINGS: Lower chest: Small bilateral pleural effusions. Hepatobiliary: There is abnormal heterogeneous appearance of the liver with multifocal scattered areas of relative increased T2 signal throughout both lobes of liver with increased T2 periportal signal. The gallbladder appears within normal limits. Fusiform dilatation of the common bile duct measures up to 1 cm. There is moderate intrahepatic dilatation to the left lobe. Mild right lobe of liver dilatation. No signs of choledocholithiasis. Pancreas: The main pancreatic duct is prominent measuring up to 4 mm. Multiple scattered areas of side branch duct ectasia identified. Multiple larger cysts are also identified predominantly  within the head of pancreas. The dominant cyst is centered around the uncinate process measuring 5.2 x 4.6 cm, image 19/5. No signs of pancreatic inflammation. Spleen: Within normal limits in size and appearance. Normal adrenal glands. Adrenals/Urinary Tract: Normal adrenal glands. No hydronephrosis. Lesion off the anterior cortex of the upper pole of the right kidney measures 0.9 cm, image 36/8. This is isointense on the coronal T2 haste. On the T2 fat sat sequences there is decreased signal within this lesion. Similarly on the out of phase sequences there is relative decreased signal within this lesion. On the unenhanced pre contrast T1 weighted sequence this is relatively hypointense to the adjacent renal parenchyma. Stomach/Bowel: Visualized portions within the abdomen are unremarkable. Vascular/Lymphatic: Normal  caliber of the abdominal aorta. Porta hepatic lymph node measures 1.3 cm, image 18/3. No retroperitoneal adenopathy. Other:  No ascites.  There is edema extending along both flanks. Musculoskeletal: No suspicious bone lesions identified. IMPRESSION: 1. Abnormal heterogeneous appearance of the liver with multifocal scattered areas of relative increased T2 signal throughout both lobes of liver with increased T2 periportal signal. Imaging findings are nonspecific likely reflect hepatic inflammation, edema, early infiltrative disease or evolving hepatocellular injury. Underlying infiltrative tumor would be difficult to exclude without the benefit of IV contrast material. 2. Fusiform dilatation of the common bile duct measures up to 1 cm. There is moderate intrahepatic dilatation to the left lobe. Mild right lobe of liver biliary dilatation. No signs of choledocholithiasis. 3. Numerous cysts identified within the dominant cyst is centered around the uncinate process measuring 5.2 x 4.6 cm. Findings may reflect mucinous cystic neoplasm versus side branch IPMN. Recommend referral to gastroenterology for further management. 4. Enlarged porta hepatic lymph node measures 1.3 cm. 5. Small bilateral pleural effusions. 6. Lesion off the anterior cortex of the upper pole of the right kidney measures 0.9 cm. 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 Electronically Signed   By: Waddell Calk M.D.   On: 07/02/2023 05:59    Chlorhexidine  Gluconate Cloth  6 each Topical Q0600   Chlorhexidine  Gluconate Cloth  6 each Topical Q0600   docusate sodium   100 mg Oral BID   dorzolamide -timolol   1 drop Both Eyes BID   furosemide   40 mg Oral BID   latanoprost   1 drop Both Eyes QHS   potassium chloride   20 mEq Oral BID    BMET    Component Value Date/Time   NA 145 07/02/2023 0712   NA 141 06/02/2023 1039   K 4.1 07/02/2023 0712   CL 115 (H) 07/02/2023  0712   CO2 21 (L) 07/02/2023 0712   GLUCOSE 181 (H) 07/02/2023 0712   BUN 69 (H) 07/02/2023 0712   BUN 15 06/02/2023 1039   CREATININE 2.00 (H) 07/02/2023 0712   CALCIUM  8.8 (L) 07/02/2023 0712   GFRNONAA 25 (L) 07/02/2023 0712   GFRAA 89 10/18/2019 0913   CBC    Component Value Date/Time   WBC 15.5 (H) 07/02/2023 0712   RBC 4.27 07/02/2023 0712   HGB 13.1 07/02/2023 0712   HGB 14.2 02/09/2022 0913   HCT 38.4 07/02/2023 0712   HCT 41.9 02/09/2022 0913   PLT 139 (L) 07/02/2023 0712   PLT 314 02/09/2022 0913   MCV 89.9 07/02/2023 0712   MCV 90 02/09/2022 0913   MCH 30.7 07/02/2023 0712   MCHC 34.1 07/02/2023 0712   RDW 22.1 (H) 07/02/2023 0712   RDW 13.3 02/09/2022 0913  LYMPHSABS 2.3 07/02/2023 0712   LYMPHSABS 1.5 05/25/2018 1029   MONOABS 1.7 (H) 07/02/2023 0712   EOSABS 0.2 07/02/2023 0712   EOSABS 0.1 05/25/2018 1029   BASOSABS 0.1 07/02/2023 0712   BASOSABS 0.1 05/25/2018 1029    Assessment/ Plan:   # AKI - Ischemic and prerenal insults - hypotension at home, ARB use, and  UTI.  But with concurrent microscopic hematuria and subnephrotic range proteinuria.  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  Will check PR-3 and MPO assays. - DSDNA, dsDNA, antiSM neg; ANCA P, - SPEP no M-paraprotein, K/L high but from renal failure - s/p kidney biopsy 06/27/23- which revealed chronic changes, AIN, and some TMA, no immune complex mediated or crescentic GN.  She denies any new medications prior to admission and does not take any PPI's.  No recent antibiotics.   - Feeling worse on 6/21 with rising BUN/Cr ->  s/p RIJ TDC and HD #1 early on 6/21 - UOP and Scr have markedly improved over the last 48 hours.  Continue to hold off on HD for now and continue to follow for ongoing renal recovery.   Scr down to 2 - Will consult IR to remove TDC early next week while she is in CIR - nothing further to  add, will sign off at this time.  Please call with questions or concerns.  She will need to follow up in our office after she is discharged from CIR.   - continue to monitor complete metabolic panel weekly while she remains in CIR and re-consult if any issues arise.      # Abnormal LFT's  - RUQ US  with fatty liver disease - she was also on amlodipine  and atorvastatin  prior to admission but atorvastatin  was stopped on 06/20/23. - off of acetaminophen  - hepatitis panel negative.  May benefit from GI evaluation - will check iron, ferritin to r/o hemachromatosis - IgG elevated, anti-sm elevated, HSV negative, CMV negative, CMV IgG Ab markedly positive -concerning for autoimmune hepatitis.  Plan per primary svc and GI evaluation.   # Hypertension - As above, remain off of ARB for now in the setting of AKI - Noted report of hypotension prior to admission - Acceptable on current regimen  - added some lasix  given inc UOP and volume overload with good response - will change to po furosemide  as Bp's are soft   # Proteinuria  - up/cr ratio 2530 mg/g  - GN w/u as above - off of ARB for AKI      # Microscopic hematuria  - Note UTI which may explain - abx per primary team  - GN w/u as above     # UTI - E coli UTI - Ceftriaxone  daily started on 6/15 - will need 1 week of therapy for complicated UTI with hematuria    # Pre-DM - Note A1c 6.2 on 06/02/23 - Optimizing pre-DM can reduce risk of CKD progression   # Angiomyolipoma - Probable angiomyolipoma on renal US  - Radiology recommended a follow-up renal ultrasound in 6 months   # Hypokalemia - replete-  also have stopped the sodium bicarb-  continue with po supplementation   # Deconditioning - for CIR or SNF placement for ongoing PT/OT. - stable from a renal standpoint for CIR  Fairy RONAL Sellar, MD [facility]

## 2023-07-02 NOTE — Progress Notes (Signed)
 Mobility Specialist: Progress Note   07/02/23 1600  Mobility  Activity Transferred from bed to chair  Level of Assistance Minimal assist, patient does 75% or more  Assistive Device Front wheel walker  Activity Response Tolerated well  Mobility Referral Yes  Mobility visit 1 Mobility  Mobility Specialist Start Time (ACUTE ONLY) 1045  Mobility Specialist Stop Time (ACUTE ONLY) 1052  Mobility Specialist Time Calculation (min) (ACUTE ONLY) 7 min    Pt received in bed, agreeable to mobility session. SV for bed mobility. MinA for STS and stand pivot to chair. No complaints. Left in chair with all needs met, call bell in reach.   Ileana Lute Mobility Specialist Please contact via SecureChat or Rehab office at [phone]

## 2023-07-02 NOTE — Progress Notes (Signed)
 PROGRESS NOTE    Marissa Orozco  FMW:[id number] DOB: [birth year] DOA: 06/17/2023 PCP: Jarold Medici, MD   Brief Narrative:  Marissa Orozco is an 78 y.o. female past medical history of CVA, essential hypertension hyperlipidemia history of kidney stones coming to the ED complaining of fatigue was found to have low blood pressure, she relates significant poor intake of less than the days, called her PCP to told her to come to the ED.  Was found to be in acute kidney injury and currently undergoing further renal workup.  Had to have a dialysis catheter placed and dialyzed on 06/25/2003.  Concern for glomerulonephritis however per Nephrology, renal biopsy done and revealed chronic changes, AIN and some TMA but no immune complex mediated or crescentic glomerular nephritis noted.  Appears significantly fatigued and deconditioned so PT/OT are recommending CIR.  Incidentally also noted to have abnormal LFTs so we will do further workup for this as well and obtain RUQ U/S which showed Fatty Liver Disease and Acute Hepatitis Panel Negative.  Pursuing further hepatic workup after discussion with GI and GI evaluating and considering liver biopsy now as she had an MRCP done and several labs done concerning for autoimmune hepatitis.  Assessment and Plan:  ATN/ Normal Anion Gap Metabolic Acidosis/ Uremic: Hyperphosphatemia -BUN/Cr Trend: Recent Labs  Lab 06/26/23 0533 06/27/23 0539 06/28/23 0522 06/29/23 0451 06/30/23 0543 07/01/23 0652 07/02/23 0712  BUN 44* 50* 61* 73* 76* 72* 69*  CREATININE 3.64* 3.46* 3.25* 3.30* 2.66* 2.45* 2.00*  -U/A showed white blood cells and protein.  C3 and C4 complements are low, ANA positive as well as p-ANCA 1:160; No M spike on SPEP. -Phos Level went from 5.0 -> 5.2 -> 5.6 and is now 4.3 -Has a slight metabolic acidosis with a CO2 of 21, anion gap of 9, chloride level 150 -Avoid Nephrotoxic Medications, Contrast Dyes, Hypotension and Dehydration to Ensure Adequate  Renal Perfusion and will need to Renally Adjust Meds -Status post renal biopsy on 06/27/2023 by IR as there is concern for Glomerulonephritis.  No biopsy revealed chronic changes, AIN, and some TMA but no immune complex mediated and crescentic glomerulonephritis -Status post dialysis catheter placement and first dialysis in 06/25/2023. Follow ongoing renal recovery. -Continue to Monitor and Trend Renal Function carefully and repeat CMP in the AM  -Further Management per Nephrology; Renal Fxn is improving  -PT/OT consulted and recommending CIR but will hold discharging her to CIR on 6/29 given further evaluation by GI.  Nephrology recommends to discontinue TDC now  Proteinuria: Urine protein/creatinine ratio was 2530 and currently getting workup as above.  She is off of ARB given her AKI and nephrology following closely  Microscopic Hematuria: Likely in the setting of UTI.  Antibiotics have now been discontinued.  She has had renal workup as above   Essential HTN (Hypertension): Holding ARB in the setting of ATN. Blood pressure continues to be stable and now on the softer side. IV Lasix  40 mg BID changing po Furosemide  40 mg po BID. CTM BP per Protcol. Last BP reading was 120/62   Abnormal LFTs / Hyperbilirubinemia: AST is now 927 -> 897 -> 855 and ALT is 448 -> 506 -> 520. T Bili is now 4.0 (1.9 Indirect, 2.1 Direct) -> 3.9 -> 4.1 on last check. D/C'd Acetaminophen  given Abnormal LFTs. Checked RUQ U/S and showed Fatty Liver Disease. Acute Hepatitis Panel Negative. CTM and Trend Hepatic Fxn Panel and repeat CMP in the AM. D/w GI informally and they are recommending  checking an IgG level (2038), anti-smooth muscle antibody +, CMV IgM <30.0 and IgG >10.00, Epstein-Barr IgM 56.0, IgG >600, and HSV non-reactive as well as getting a noncontrast MRCP. MRCP done and showed some fusiform dilatation of the common bile duct to 1 cm some intrahepatic dilatation the left side as well as incidentally noting a 5.2 cm x  4.6 cm pancreatic cyst which will need further evaluation by EUS.  Nephrology is checking iron studies to evaluate for hemochromatosis.  GI formally consulted and they are considering a liver biopsy for further evaluation and discussing with advanced endoscopy colleagues to see what they think about pursuing an ERCP.  Current plan is to hold her discharge to CIR for now given further workup for liver biopsy  Pre-Diabetes: Recent hemoglobin A1c was 6.2 on 06/02/2023.  CTM CBGs per protocol.  Blood Sugars ranging from 97-181 on daily BMP/CMP's.   E. coli UTI: Completed a 7 day course of IV Ceftriaxone . WBC went from 14.4 -> 13.9 -> 15.3 -> 15.1 -> 15.5. ? Reactive Leukocytosis. CTM and Trend and repeat CBC in the AM.    Hyperlipidemia: Discontinue Ezetimibe  10 mg po Daily. Currently holding Atorvastatin  80 mg po Daily given Abnormal LFTs   History of CVA: Holding Aspirin  81 mg po daily and Atorvastatin  80 mg po Daily.  Glaucoma: C/w Dorzolamide -Timolol  2-0.5% ophthalmic solution 1 drop both Eyes BID and Latanoprost  1 drop Both Eyes qHS   Hypokalemia: K+ is now 4.1. C/w po KCL 20 mEQ BID. CTM and Trend and Replete as Necessary. Goal K+ > 4.0 and Mag > 2.0. Repeat CMP and Mag Level in the AM  Thrombocytopenia: Platelet Count went from 113 -> 107 -> 116 -> 108 -> 130 -> 120 -> 139. CTM and for S/Sx of Bleeding; No overt bleeding noted. Repeat CBC in the AM   Hypoalbuminemia: Patient's Albumin Level went from 2.1 -> 1.9 -> 1.7 -> 1.8 x3. CTM and Trend and repeat CMP in the AM  Class I Obesity: Complicates overall prognosis and care. Estimated body mass index is 30.68 kg/m as calculated from the following:   Height as of this encounter: 5' 3.5 (1.613 m).   Weight as of this encounter: 79.8 kg. Weight Loss and Dietary Counseling given   DVT prophylaxis: Place and maintain sequential compression device Start: 06/20/23 1531    Code Status: Full Code Family Communication: No family at  bedside  Disposition Plan:  Level of care: Telemetry Medical Status is: Inpatient Remains inpatient appropriate because: Needs further clinical improvement and clearance by the specialists   Consultants:  Gastroenterology Nephrology Interventional radiology CIR  Procedures:  As delineated as above  Antimicrobials:  Anti-infectives (From admission, onward)    Start     Dose/Rate Route Frequency Ordered Stop   06/24/23 1430  ceFAZolin  (ANCEF ) IVPB 2g/100 mL premix        2 g 200 mL/hr over 30 Minutes Intravenous  Once 06/24/23 1333 06/25/23 0743   06/24/23 1415  ceFAZolin  (ANCEF ) IVPB 2g/100 mL premix  Status:  Discontinued        2 g 200 mL/hr over 30 Minutes Intravenous  Once 06/24/23 1327 06/24/23 1333   06/19/23 2000  cefTRIAXone  (ROCEPHIN ) 1 g in sodium chloride  0.9 % 100 mL IVPB        1 g 200 mL/hr over 30 Minutes Intravenous Every 24 hours 06/19/23 1928 06/26/23 0748       Subjective: Seen and examined at bedside and is still very fatigued.  Denied any nausea or vomiting.  No chest pain or lightheadedness or dizziness.  No other concerns or complaints this time.  Objective: Vitals:   07/01/23 2059 07/02/23 0516 07/02/23 0826 07/02/23 1739  BP: 111/72 (!) 101/59 95/60 120/62  Pulse: 70 71 69 74  Resp: 18 18    Temp: 98.6 F (37 C) (!) 97.1 F (36.2 C) 97.9 F (36.6 C)   TempSrc: Oral Oral Oral   SpO2: 99% 94% 99% 100%  Weight:      Height:        Intake/Output Summary (Last 24 hours) at 07/02/2023 1926 Last data filed at 07/02/2023 1809 Gross per 24 hour  Intake 720 ml  Output 800 ml  Net -80 ml   Filed Weights   06/26/23 0500 06/26/23 1951 06/27/23 0701  Weight: 82.6 kg 81.6 kg 79.8 kg   Examination: Physical Exam:  Constitutional: WN/WD obese chronically ill-appearing African-American No acute distress Respiratory: Diminished to auscultation bilaterally, no wheezing, rales, rhonchi or crackles. Normal respiratory effort and patient is not  tachypenic. No accessory muscle use.  Unlabored breathing Cardiovascular: RRR, no murmurs / rubs / gallops. S1 and S2 auscultated.  Trace lower extremity edema Abdomen: Soft, non-tender, distended secondary body habitus. Bowel sounds positive.  GU: Deferred. Musculoskeletal: No clubbing / cyanosis of digits/nails.  Normal strength and muscle tone.  Skin: No rashes, lesions, ulcers on plain skin evaluation. No induration; Warm and dry.  Neurologic: CN 2-12 grossly intact with no focal deficits. Romberg sign and cerebellar reflexes not assessed.  Psychiatric: Normal judgment and insight. Alert and oriented x 3. Normal mood and appropriate affect.   Data Reviewed: I have personally reviewed following labs and imaging studies  CBC: Recent Labs  Lab 06/28/23 0522 06/29/23 0940 06/30/23 0543 07/01/23 1036 07/02/23 0712  WBC 14.4* 13.9* 15.3* 15.1* 15.5*  NEUTROABS  --  10.5* 12.9* 11.4* 11.2*  HGB 13.7 13.8 13.6 14.2 13.1  HCT 38.5 40.0 39.0 41.1 38.4  MCV 84.2 87.0 86.7 88.4 89.9  PLT 116* 108* 130* 120* 139*   Basic Metabolic Panel: Recent Labs  Lab 06/28/23 0522 06/29/23 0451 06/29/23 0940 06/30/23 0543 07/01/23 0652 07/02/23 0712  NA 139 139  --  140 141 145  K 3.1* 3.2*  --  4.2 4.2 4.1  CL 105 106  --  109 110 115*  CO2 23 22  --  23 21* 21*  GLUCOSE 152* 133*  --  102* 132* 181*  BUN 61* 73*  --  76* 72* 69*  CREATININE 3.25* 3.30*  --  2.66* 2.45* 2.00*  CALCIUM  8.2* 8.2*  --  8.6* 8.6* 8.8*  MG  --   --  1.9  --  1.9 1.8  PHOS 5.0* 5.2*  --  5.6* 4.2 4.3   GFR: Estimated Creatinine Clearance: 23.1 mL/min (A) (by C-G formula based on SCr of 2 mg/dL (H)). Liver Function Tests: Recent Labs  Lab 06/29/23 0451 06/29/23 0940 06/30/23 0543 07/01/23 0652 07/02/23 0712  AST  --  927*  --  897* 855*  ALT  --  448*  --  506* 520*  ALKPHOS  --  262*  --  248* 221*  BILITOT  --  4.0*  --  3.9* 4.1*  PROT  --  5.5*  --  5.4* 5.6*  ALBUMIN 1.7* 1.8* 1.8* 1.8* 1.8*    No results for input(s): LIPASE, AMYLASE in the last 168 hours. No results for input(s): AMMONIA in the last 168 hours. Coagulation  Profile: Recent Labs  Lab 06/27/23 0539 07/02/23 1543  INR 1.4* 1.5*   Cardiac Enzymes: No results for input(s): CKTOTAL, CKMB, CKMBINDEX, TROPONINI in the last 168 hours. BNP (last 3 results) No results for input(s): PROBNP in the last 8760 hours. HbA1C: No results for input(s): HGBA1C in the last 72 hours. CBG: No results for input(s): GLUCAP in the last 168 hours. Lipid Profile: No results for input(s): CHOL, HDL, LDLCALC, TRIG, CHOLHDL, LDLDIRECT in the last 72 hours. Thyroid  Function Tests: No results for input(s): TSH, T4TOTAL, FREET4, T3FREE, THYROIDAB in the last 72 hours. Anemia Panel: No results for input(s): VITAMINB12, FOLATE, FERRITIN, TIBC, IRON, RETICCTPCT in the last 72 hours. Sepsis Labs: No results for input(s): PROCALCITON, LATICACIDVEN in the last 168 hours.  Recent Results (from the past 240 hours)  MRSA Next Gen by PCR, Nasal     Status: None   Collection Time: 06/29/23  8:21 AM   Specimen: Nasal Mucosa; Nasal Swab  Result Value Ref Range Status   MRSA by PCR Next Gen NOT DETECTED NOT DETECTED Final    Comment: (NOTE) The GeneXpert MRSA Assay (FDA approved for NASAL specimens only), is one component of a comprehensive MRSA colonization surveillance program. It is not intended to diagnose MRSA infection nor to guide or monitor treatment for MRSA infections. Test performance is not FDA approved in patients less than 71 years old. Performed at [facility], [address], [place], [place] [address]     Radiology Studies: MR ABDOMEN MRCP WO CONTRAST Result Date: 07/02/2023 CLINICAL DATA:  Jaundice. EXAM: MRI ABDOMEN WITHOUT CONTRAST  (INCLUDING MRCP) TECHNIQUE: Multiplanar multisequence MR imaging of the abdomen was performed. Heavily T2-weighted images of  the biliary and pancreatic ducts were obtained, and three-dimensional MRCP images were rendered by post processing. COMPARISON:  Ultrasound 06/29/2023 FINDINGS: Lower chest: Small bilateral pleural effusions. Hepatobiliary: There is abnormal heterogeneous appearance of the liver with multifocal scattered areas of relative increased T2 signal throughout both lobes of liver with increased T2 periportal signal. The gallbladder appears within normal limits. Fusiform dilatation of the common bile duct measures up to 1 cm. There is moderate intrahepatic dilatation to the left lobe. Mild right lobe of liver dilatation. No signs of choledocholithiasis. Pancreas: The main pancreatic duct is prominent measuring up to 4 mm. Multiple scattered areas of side branch duct ectasia identified. Multiple larger cysts are also identified predominantly within the head of pancreas. The dominant cyst is centered around the uncinate process measuring 5.2 x 4.6 cm, image 19/5. No signs of pancreatic inflammation. Spleen: Within normal limits in size and appearance. Normal adrenal glands. Adrenals/Urinary Tract: Normal adrenal glands. No hydronephrosis. Lesion off the anterior cortex of the upper pole of the right kidney measures 0.9 cm, image 36/8. This is isointense on the coronal T2 haste. On the T2 fat sat sequences there is decreased signal within this lesion. Similarly on the out of phase sequences there is relative decreased signal within this lesion. On the unenhanced pre contrast T1 weighted sequence this is relatively hypointense to the adjacent renal parenchyma. Stomach/Bowel: Visualized portions within the abdomen are unremarkable. Vascular/Lymphatic: Normal caliber of the abdominal aorta. Porta hepatic lymph node measures 1.3 cm, image 18/3. No retroperitoneal adenopathy. Other:  No ascites.  There is edema extending along both flanks. Musculoskeletal: No suspicious bone lesions identified. IMPRESSION: 1. Abnormal heterogeneous  appearance of the liver with multifocal scattered areas of relative increased T2 signal throughout both lobes of liver with increased T2 periportal  signal. Imaging findings are nonspecific likely reflect hepatic inflammation, edema, early infiltrative disease or evolving hepatocellular injury. Underlying infiltrative tumor would be difficult to exclude without the benefit of IV contrast material. 2. Fusiform dilatation of the common bile duct measures up to 1 cm. There is moderate intrahepatic dilatation to the left lobe. Mild right lobe of liver biliary dilatation. No signs of choledocholithiasis. 3. Numerous cysts identified within the dominant cyst is centered around the uncinate process measuring 5.2 x 4.6 cm. Findings may reflect mucinous cystic neoplasm versus side branch IPMN. Recommend referral to gastroenterology for further management. 4. Enlarged porta hepatic lymph node measures 1.3 cm. 5. Small bilateral pleural effusions. 6. Lesion off the anterior cortex of the upper pole of the right kidney measures 0.9 cm. 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 Electronically Signed   By: Waddell Calk M.D.   On: 07/02/2023 05:59   MR 3D Recon At Scanner Result Date: 07/02/2023 CLINICAL DATA:  Jaundice. EXAM: MRI ABDOMEN WITHOUT CONTRAST  (INCLUDING MRCP) TECHNIQUE: Multiplanar multisequence MR imaging of the abdomen was performed. Heavily T2-weighted images of the biliary and pancreatic ducts were obtained, and three-dimensional MRCP images were rendered by post processing. COMPARISON:  Ultrasound 06/29/2023 FINDINGS: Lower chest: Small bilateral pleural effusions. Hepatobiliary: There is abnormal heterogeneous appearance of the liver with multifocal scattered areas of relative increased T2 signal throughout both lobes of liver with increased T2 periportal signal. The gallbladder appears within normal limits. Fusiform dilatation of  the common bile duct measures up to 1 cm. There is moderate intrahepatic dilatation to the left lobe. Mild right lobe of liver dilatation. No signs of choledocholithiasis. Pancreas: The main pancreatic duct is prominent measuring up to 4 mm. Multiple scattered areas of side branch duct ectasia identified. Multiple larger cysts are also identified predominantly within the head of pancreas. The dominant cyst is centered around the uncinate process measuring 5.2 x 4.6 cm, image 19/5. No signs of pancreatic inflammation. Spleen: Within normal limits in size and appearance. Normal adrenal glands. Adrenals/Urinary Tract: Normal adrenal glands. No hydronephrosis. Lesion off the anterior cortex of the upper pole of the right kidney measures 0.9 cm, image 36/8. This is isointense on the coronal T2 haste. On the T2 fat sat sequences there is decreased signal within this lesion. Similarly on the out of phase sequences there is relative decreased signal within this lesion. On the unenhanced pre contrast T1 weighted sequence this is relatively hypointense to the adjacent renal parenchyma. Stomach/Bowel: Visualized portions within the abdomen are unremarkable. Vascular/Lymphatic: Normal caliber of the abdominal aorta. Porta hepatic lymph node measures 1.3 cm, image 18/3. No retroperitoneal adenopathy. Other:  No ascites.  There is edema extending along both flanks. Musculoskeletal: No suspicious bone lesions identified. IMPRESSION: 1. Abnormal heterogeneous appearance of the liver with multifocal scattered areas of relative increased T2 signal throughout both lobes of liver with increased T2 periportal signal. Imaging findings are nonspecific likely reflect hepatic inflammation, edema, early infiltrative disease or evolving hepatocellular injury. Underlying infiltrative tumor would be difficult to exclude without the benefit of IV contrast material. 2. Fusiform dilatation of the common bile duct measures up to 1 cm. There is  moderate intrahepatic dilatation to the left lobe. Mild right lobe of liver biliary dilatation. No signs of choledocholithiasis. 3. Numerous cysts identified within the dominant cyst is centered around the uncinate process measuring 5.2 x 4.6 cm. Findings may reflect mucinous cystic neoplasm versus side branch IPMN.  Recommend referral to gastroenterology for further management. 4. Enlarged porta hepatic lymph node measures 1.3 cm. 5. Small bilateral pleural effusions. 6. Lesion off the anterior cortex of the upper pole of the right kidney measures 0.9 cm. 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 Electronically Signed   By: Waddell Calk M.D.   On: 07/02/2023 05:59   Scheduled Meds:  Chlorhexidine  Gluconate Cloth  6 each Topical Q0600   Chlorhexidine  Gluconate Cloth  6 each Topical Q0600   docusate sodium   100 mg Oral BID   dorzolamide -timolol   1 drop Both Eyes BID   furosemide   40 mg Oral BID   latanoprost   1 drop Both Eyes QHS   potassium chloride   20 mEq Oral BID   Continuous Infusions:   LOS: 14 days   Alejandro Marker, DO [facility] Available via Epic secure chat 7am-7pm After these hours, please refer to coverage provider listed on [email or web address] 07/02/2023, 7:26 PM

## 2023-07-02 NOTE — Plan of Care (Signed)
  Problem: Health Behavior/Discharge Planning: Goal: Ability to manage health-related needs will improve Outcome: Progressing   Problem: Clinical Measurements: Goal: Ability to maintain clinical measurements within normal limits will improve Outcome: Progressing Goal: Will remain free from infection Outcome: Progressing   Problem: Activity: Goal: Risk for activity intolerance will decrease Outcome: Progressing   Problem: Nutrition: Goal: Adequate nutrition will be maintained Outcome: Progressing   Problem: Coping: Goal: Level of anxiety will decrease Outcome: Progressing   Problem: Elimination: Goal: Will not experience complications related to bowel motility Outcome: Progressing Goal: Will not experience complications related to urinary retention Outcome: Progressing   Problem: Pain Managment: Goal: General experience of comfort will improve and/or be controlled Outcome: Progressing   Problem: Safety: Goal: Ability to remain free from injury will improve Outcome: Progressing   Problem: Skin Integrity: Goal: Risk for impaired skin integrity will decrease Outcome: Progressing   Problem: Urinary Elimination: Goal: Signs and symptoms of infection will decrease Outcome: Progressing   Problem: Education: Goal: Knowledge of disease and its progression will improve Outcome: Progressing   Problem: Health Behavior/Discharge Planning: Goal: Ability to manage health-related needs will improve Outcome: Progressing   Problem: Clinical Measurements: Goal: Complications related to the disease process or treatment will be avoided or minimized Outcome: Progressing Goal: Dialysis access will remain free of complications Outcome: Progressing   Problem: Activity: Goal: Activity intolerance will improve Outcome: Progressing   Problem: Fluid Volume: Goal: Fluid volume balance will be maintained or improved Outcome: Progressing   Problem: Nutritional: Goal: Ability to make  appropriate dietary choices will improve Outcome: Progressing   Problem: Respiratory: Goal: Respiratory symptoms related to disease process will be avoided Outcome: Progressing   Problem: Self-Concept: Goal: Body image disturbance will be avoided or minimized Outcome: Progressing   Problem: Urinary Elimination: Goal: Progression of disease will be identified and treated Outcome: Progressing   Problem: Education: Goal: Knowledge of General Education information will improve Description: Including pain rating scale, medication(s)/side effects and non-pharmacologic comfort measures Outcome: Progressing   Problem: Health Behavior/Discharge Planning: Goal: Ability to manage health-related needs will improve Outcome: Progressing   Problem: Clinical Measurements: Goal: Ability to maintain clinical measurements within normal limits will improve Outcome: Progressing Goal: Will remain free from infection Outcome: Progressing Goal: Diagnostic test results will improve Outcome: Progressing Goal: Respiratory complications will improve Outcome: Progressing Goal: Cardiovascular complication will be avoided Outcome: Progressing   Problem: Activity: Goal: Risk for activity intolerance will decrease Outcome: Progressing   Problem: Nutrition: Goal: Adequate nutrition will be maintained Outcome: Progressing   Problem: Coping: Goal: Level of anxiety will decrease Outcome: Progressing   Problem: Elimination: Goal: Will not experience complications related to bowel motility Outcome: Progressing Goal: Will not experience complications related to urinary retention Outcome: Progressing   Problem: Pain Managment: Goal: General experience of comfort will improve and/or be controlled Outcome: Progressing   Problem: Safety: Goal: Ability to remain free from injury will improve Outcome: Progressing   Problem: Skin Integrity: Goal: Risk for impaired skin integrity will  decrease Outcome: Progressing

## 2023-07-02 NOTE — H&P (Incomplete)
 Physical Medicine and Rehabilitation Admission H&P    Chief Complaint  Patient presents with   Functional Debility   : HPI: Marissa Orozco is a 78 y.o female with PMHx of GERD, glaucoma, HTN, CVA,   Admitted for AKI   S/p kidney bx 06/27/23: chronic ahnges, AIN, and some TMA, no immune complex mediated or crecentic GN.  Temp dialysis cath to be removed next week.   Medical Problem List and Plan: 1. Functional deficits secondary to ***  -patient may *** shower  -ELOS/Goals: *** 2.  Antithrombotics: -DVT/anticoagulation:  {VTE PROPHYLAXIS/ANTICOAGULATION - UBEZ:695061}  -antiplatelet therapy: *** 3. Pain Management: *** 4. Mood/Behavior/Sleep: ***  -antipsychotic agents: *** 5. Neuropsych/cognition: This patient *** capable of making decisions on *** own behalf. 6. Skin/Wound Care: *** 7. Fluids/Electrolytes/Nutrition: Monitor I&O. CMET    -ordered orthostatic bp's, may need compression garmet    ROS Past Medical History:  Diagnosis Date   Allergy 2000   Breast lump    Cataract    Colon polyps    Fibroids    GERD (gastroesophageal reflux disease)    Glaucoma    Heart murmur    Hyperlipidemia 01/05/1984   Hypertension 01/05/1984   Interstitial cystitis 01/04/1997   Kidney stones 01/05/2003   Neuromuscular disorder (HCC)    Osteoporosis    Preop cardiovascular exam 10/25/2022   Sleep apnea 2021   Tonsillitis    Past Surgical History:  Procedure Laterality Date   ABDOMINAL HYSTERECTOMY  1997   BREAST EXCISIONAL BIOPSY Right 07/27/2006   Intraductal Papilloma   BREAST SURGERY  2008   CATARACT EXTRACTION Bilateral 02/2020   other in 05/2020   COLONOSCOPY  2010   Dr Kristie   EYE SURGERY  2022   HYSTEROSCOPY  1993   IR FLUORO GUIDE CV LINE RIGHT  06/24/2023   IR US  GUIDE VASC ACCESS RIGHT  06/24/2023   LAPAROSCOPIC HYSTERECTOMY  1994   MYOMECTOMY  1993   excision of tumor   POLYPECTOMY     TONSILLECTOMY  1953   TUBAL LIGATION  1980   Family History   Problem Relation Age of Onset   Hypertension Mother    Heart disease Mother    Colon cancer Mother 36   Arthritis Mother    Cancer Mother    Coronary artery disease Father        mother, PGM   Stroke Father    Hypertension Father    Colon cancer Father 20   Cancer Father    Colon cancer Maternal Grandmother    Diabetes Other    Breast cancer Neg Hx    Colon polyps Neg Hx    Esophageal cancer Neg Hx    Stomach cancer Neg Hx    Rectal cancer Neg Hx    BRCA 1/2 Neg Hx    Social History:  reports that she has never smoked. She has never used smokeless tobacco. She reports current alcohol use of about 5.0 standard drinks of alcohol per week. She reports that she does not use drugs. Allergies:  Allergies  Allergen Reactions   Hydrochlorothiazide Itching   Medications Prior to Admission  Medication Sig Dispense Refill   amLODipine  (NORVASC ) 5 MG tablet TAKE 1 TABLET BY MOUTH DAILY (Patient taking differently: Take 5 mg by mouth at bedtime.) 90 tablet 3   aspirin  EC 81 MG tablet Take 81 mg by mouth daily. Swallow whole.     atorvastatin  (LIPITOR) 80 MG tablet TAKE 1 TABLET BY MOUTH DAILY  90 tablet 3   Calcium  Carbonate (CALCIUM  500 PO) Take 500 mg by mouth daily with breakfast.     Cholecalciferol (VITAMIN D3) 50 MCG (2000 UT) TABS Take 2,000 Units by mouth daily with breakfast.     dorzolamide -timolol  (COSOPT ) 22.3-6.8 MG/ML ophthalmic solution Place 1 drop into both eyes in the morning and at bedtime.     ezetimibe  (ZETIA ) 10 MG tablet Take 1 tablet (10 mg total) by mouth daily. 90 tablet 3   finasteride (PROSCAR) 5 MG tablet Take 5 mg by mouth daily.     latanoprost  (XALATAN ) 0.005 % ophthalmic solution Place 1 drop into both eyes at bedtime.     valsartan  (DIOVAN ) 160 MG tablet Take 1 tablet (160 mg total) by mouth daily. 90 tablet 3      Home: Home Living Family/patient expects to be discharged to:: Private residence Living Arrangements: Spouse/significant  other Available Help at Discharge: Family, Available 24 hours/day Type of Home: House Home Access: Stairs to enter [employer] of Steps: 4 Entrance Stairs-Rails: Can reach both Home Layout: Two level Alternate Level Stairs-Number of Steps: flight Alternate Level Stairs-Rails: Can reach both Bathroom Shower/Tub: Health visitor: Standard Home Equipment: None   Functional History: Prior Function Prior Level of Function : Independent/Modified Independent, Driving Mobility Comments: no AD, does aerobics at the [employer] ADLs Comments: indep  Functional Status:  Mobility: Bed Mobility Overal bed mobility: Needs Assistance Bed Mobility: Supine to Sit Supine to sit: Mod assist General bed mobility comments: HOB elevated, assist for LEs and hips to EOB, pt able to help more with UEs this date Transfers Overall transfer level: Needs assistance Equipment used: Rolling walker (2 wheels) Transfers: Sit to/from Stand, Bed to chair/wheelchair/BSC Sit to Stand: From elevated surface, Min assist Bed to/from chair/wheelchair/BSC transfer type:: Step pivot Step pivot transfers: Min assist General transfer comment: elevated bed, use of momentum, assist to rise and steady, cues for hand placement, increased time for stepping sequencing, minA for walker management, increased trunk flexion, labor effort/quick onset of fatigue Ambulation/Gait General Gait Details: unable this date due to fatigue    ADL: ADL Overall ADL's : Needs assistance/impaired Eating/Feeding: Independent, Sitting Grooming: Set up, Sitting Upper Body Bathing: Minimal assistance, Sitting Lower Body Bathing: Total assistance, Sit to/from stand Upper Body Dressing : Minimal assistance, Sitting Lower Body Dressing: Total assistance, Sit to/from stand Toilet Transfer: Minimal assistance, Stand-pivot, Rolling walker (2 wheels)  Cognition: Cognition Orientation Level: Oriented X4 Cognition Arousal:  Alert Behavior During Therapy: Flat affect  Physical Exam: Blood pressure 95/60, pulse 69, temperature 97.9 F (36.6 C), temperature source Oral, resp. rate 18, height 5' 3.5 (1.613 m), weight 79.8 kg, SpO2 99%. Physical Exam  Results for orders placed or performed during the hospital encounter of 06/17/23 (from the past 48 hours)  Comprehensive metabolic panel with GFR     Status: Abnormal   Collection Time: 07/01/23  6:52 AM  Result Value Ref Range   Sodium 141 135 - 145 mmol/L   Potassium 4.2 3.5 - 5.1 mmol/L   Chloride 110 98 - 111 mmol/L   CO2 21 (L) 22 - 32 mmol/L   Glucose, Bld 132 (H) 70 - 99 mg/dL    Comment: Glucose reference range applies only to samples taken after fasting for at least 8 hours.   BUN 72 (H) 8 - 23 mg/dL   Creatinine, Ser 7.54 (H) 0.44 - 1.00 mg/dL   Calcium  8.6 (L) 8.9 - 10.3 mg/dL   Total Protein 5.4 (  L) 6.5 - 8.1 g/dL   Albumin 1.8 (L) 3.5 - 5.0 g/dL   AST 102 (H) 15 - 41 U/L   ALT 506 (H) 0 - 44 U/L   Alkaline Phosphatase 248 (H) 38 - 126 U/L   Total Bilirubin 3.9 (H) 0.0 - 1.2 mg/dL   GFR, Estimated 20 (L) >60 mL/min    Comment: (NOTE) Calculated using the CKD-EPI Creatinine Equation (2021)    Anion gap 10 5 - 15    Comment: Performed at [facility], [address], [place], [place] [address]  Phosphorus     Status: None   Collection Time: 07/01/23  6:52 AM  Result Value Ref Range   Phosphorus 4.2 2.5 - 4.6 mg/dL    Comment: Performed at [facility], [address], [place], [place] [address]  Magnesium      Status: None   Collection Time: 07/01/23  6:52 AM  Result Value Ref Range   Magnesium  1.9 1.7 - 2.4 mg/dL    Comment: Performed at [facility], [address] [place], [place] [address]  CBC with Differential/Platelet     Status: Abnormal   Collection Time: 07/01/23 10:36 AM  Result Value Ref Range   WBC 15.1 (H) 4.0 - 10.5 K/uL   RBC 4.65 3.87 - 5.11 MIL/uL   Hemoglobin 14.2 12.0 - 15.0 g/dL   HCT 58.8 63.9 -  53.9 %   MCV 88.4 80.0 - 100.0 fL   MCH 30.5 26.0 - 34.0 pg   MCHC 34.5 30.0 - 36.0 g/dL   RDW 77.9 (H) 88.4 - 84.4 %   Platelets 120 (L) 150 - 400 K/uL   nRBC 0.0 0.0 - 0.2 %   Neutrophils Relative % 75 %   Neutro Abs 11.4 (H) 1.7 - 7.7 K/uL   Lymphocytes Relative 14 %   Lymphs Abs 2.1 0.7 - 4.0 K/uL   Monocytes Relative 8 %   Monocytes Absolute 1.1 (H) 0.1 - 1.0 K/uL   Eosinophils Relative 1 %   Eosinophils Absolute 0.2 0.0 - 0.5 K/uL   Basophils Relative 1 %   Basophils Absolute 0.1 0.0 - 0.1 K/uL   Immature Granulocytes 1 %   Abs Immature Granulocytes 0.10 (H) 0.00 - 0.07 K/uL    Comment: Performed at [facility], [address], [place], [place] [address]  IgG     Status: Abnormal   Collection Time: 07/01/23  6:37 PM  Result Value Ref Range   IgG (Immunoglobin G), Serum 2,038 (H) 586 - 1,602 mg/dL    Comment: (NOTE) Performed At: [facility] [address] [place], [place] [id number] Jennette Shorter MD Ey:[id number]   Anti-smooth muscle antibody, IgG     Status: Abnormal   Collection Time: 07/01/23  6:37 PM  Result Value Ref Range   F-Actin IgG 81 (H) 0 - 19 Units    Comment: (NOTE)                 Negative                     0 - 19                 Weak positive               20 - 30                 Moderate to strong positive     >  30 Actin Antibodies are found in 52-85% of patients with autoimmune hepatitis or chronic active hepatitis and in 22% of patients with primary biliary cirrhosis. Performed At: [facility] [address] [place], [place] [id number] Jennette Shorter MD Ey:[id number]   HSV(herpes simplex vrs) 1+2 ab-IgG     Status: None   Collection Time: 07/01/23  6:37 PM  Result Value Ref Range   HSV 1 Glycoprotein G Ab, IgG Non Reactive Non Reactive    Comment: (NOTE) **Please note reference interval change** HSV-1 IgG testing performed using the Roche Elecsys HSV-1 IgG assay.    HSV 2 Glycoprotein G Ab, IgG Non Reactive Non  Reactive    Comment: (NOTE) **Please note reference interval change** Current guidelines and recommendations do not recommend routine screening for HSV-2 in asymptomatic individuals, including those that are pregnant. The detection of HSV-2 IgG antibodies in a single sample indicates previous exposure to HSV-2 but does not give information as to the site of HSV infection or the timing of exposure. The predictive value of positive and negative results depends on the population's prevalence and the pretest likelihood of HSV-2. HSV-2 IgG testing performed using the Roche Elecsys HSV-2 IgG assay. Performed At: [facility] [address] [place], [place] [id number] Jennette Shorter MD Ey:[id number]   CMV IgM     Status: None   Collection Time: 07/01/23  6:37 PM  Result Value Ref Range   CMV IgM <30.0 0.0 - 29.9 AU/mL    Comment: (NOTE)                                Negative         <30.0                                Equivocal  30.0 - 34.9                                Positive         >34.9 A positive result is generally indicative of acute infection, reactivation or persistent IgM production. Performed At: [facility] [address] [place], [place] [id number] Jennette Shorter MD Ey:[id number]   Cmv antibody, IgG (EIA)     Status: Abnormal   Collection Time: 07/01/23  6:37 PM  Result Value Ref Range   CMV Ab - IgG >10.00 (H) 0.00 - 0.59 U/mL    Comment: (NOTE)                               Negative          <0.60                               Equivocal   0.60 - 0.69                               Positive          >0.69 Performed At: [facility] [address] [place], [place] [id number] Jennette Shorter MD Ey:[id number]   Comprehensive metabolic panel     Status: Abnormal   Collection Time: 07/02/23  7:12 AM  Result Value  Ref Range   Sodium 145 135 - 145 mmol/L   Potassium 4.1 3.5 - 5.1 mmol/L   Chloride 115 (H) 98 - 111 mmol/L   CO2 21 (L) 22 - 32  mmol/L   Glucose, Bld 181 (H) 70 - 99 mg/dL    Comment: Glucose reference range applies only to samples taken after fasting for at least 8 hours.   BUN 69 (H) 8 - 23 mg/dL   Creatinine, Ser 7.99 (H) 0.44 - 1.00 mg/dL   Calcium  8.8 (L) 8.9 - 10.3 mg/dL   Total Protein 5.6 (L) 6.5 - 8.1 g/dL   Albumin 1.8 (L) 3.5 - 5.0 g/dL   AST 144 (H) 15 - 41 U/L   ALT 520 (H) 0 - 44 U/L   Alkaline Phosphatase 221 (H) 38 - 126 U/L   Total Bilirubin 4.1 (H) 0.0 - 1.2 mg/dL   GFR, Estimated 25 (L) >60 mL/min    Comment: (NOTE) Calculated using the CKD-EPI Creatinine Equation (2021)    Anion gap 9 5 - 15    Comment: Performed at [facility], [address], [place], [place] [address]  CBC with Differential/Platelet     Status: Abnormal   Collection Time: 07/02/23  7:12 AM  Result Value Ref Range   WBC 15.5 (H) 4.0 - 10.5 K/uL   RBC 4.27 3.87 - 5.11 MIL/uL   Hemoglobin 13.1 12.0 - 15.0 g/dL   HCT 61.5 63.9 - 53.9 %   MCV 89.9 80.0 - 100.0 fL   MCH 30.7 26.0 - 34.0 pg   MCHC 34.1 30.0 - 36.0 g/dL   RDW 77.8 (H) 88.4 - 84.4 %   Platelets 139 (L) 150 - 400 K/uL   nRBC 0.0 0.0 - 0.2 %   Neutrophils Relative % 71 %   Neutro Abs 11.2 (H) 1.7 - 7.7 K/uL   Lymphocytes Relative 15 %   Lymphs Abs 2.3 0.7 - 4.0 K/uL   Monocytes Relative 11 %   Monocytes Absolute 1.7 (H) 0.1 - 1.0 K/uL   Eosinophils Relative 1 %   Eosinophils Absolute 0.2 0.0 - 0.5 K/uL   Basophils Relative 1 %   Basophils Absolute 0.1 0.0 - 0.1 K/uL   Immature Granulocytes 1 %   Abs Immature Granulocytes 0.10 (H) 0.00 - 0.07 K/uL    Comment: Performed at [facility], [address], [place], [place] [address]  Magnesium      Status: None   Collection Time: 07/02/23  7:12 AM  Result Value Ref Range   Magnesium  1.8 1.7 - 2.4 mg/dL    Comment: Performed at [facility], [address] [place], [place] [address]  Phosphorus     Status: None   Collection Time: 07/02/23  7:12 AM  Result Value Ref Range   Phosphorus  4.3 2.5 - 4.6 mg/dL    Comment: Performed at [facility], [address] [place], [place] [address]   MR ABDOMEN MRCP WO CONTRAST Result Date: 07/02/2023 CLINICAL DATA:  Jaundice. EXAM: MRI ABDOMEN WITHOUT CONTRAST  (INCLUDING MRCP) TECHNIQUE: Multiplanar multisequence MR imaging of the abdomen was performed. Heavily T2-weighted images of the biliary and pancreatic ducts were obtained, and three-dimensional MRCP images were rendered by post processing. COMPARISON:  Ultrasound 06/29/2023 FINDINGS: Lower chest: Small bilateral pleural effusions. Hepatobiliary: There is abnormal heterogeneous appearance of the liver with multifocal scattered areas of relative increased T2 signal throughout both lobes of liver with increased T2 periportal signal. The  gallbladder appears within normal limits. Fusiform dilatation of the common bile duct measures up to 1 cm. There is moderate intrahepatic dilatation to the left lobe. Mild right lobe of liver dilatation. No signs of choledocholithiasis. Pancreas: The main pancreatic duct is prominent measuring up to 4 mm. Multiple scattered areas of side branch duct ectasia identified. Multiple larger cysts are also identified predominantly within the head of pancreas. The dominant cyst is centered around the uncinate process measuring 5.2 x 4.6 cm, image 19/5. No signs of pancreatic inflammation. Spleen: Within normal limits in size and appearance. Normal adrenal glands. Adrenals/Urinary Tract: Normal adrenal glands. No hydronephrosis. Lesion off the anterior cortex of the upper pole of the right kidney measures 0.9 cm, image 36/8. This is isointense on the coronal T2 haste. On the T2 fat sat sequences there is decreased signal within this lesion. Similarly on the out of phase sequences there is relative decreased signal within this lesion. On the unenhanced pre contrast T1 weighted sequence this is relatively hypointense to the adjacent renal parenchyma. Stomach/Bowel:  Visualized portions within the abdomen are unremarkable. Vascular/Lymphatic: Normal caliber of the abdominal aorta. Porta hepatic lymph node measures 1.3 cm, image 18/3. No retroperitoneal adenopathy. Other:  No ascites.  There is edema extending along both flanks. Musculoskeletal: No suspicious bone lesions identified. IMPRESSION: 1. Abnormal heterogeneous appearance of the liver with multifocal scattered areas of relative increased T2 signal throughout both lobes of liver with increased T2 periportal signal. Imaging findings are nonspecific likely reflect hepatic inflammation, edema, early infiltrative disease or evolving hepatocellular injury. Underlying infiltrative tumor would be difficult to exclude without the benefit of IV contrast material. 2. Fusiform dilatation of the common bile duct measures up to 1 cm. There is moderate intrahepatic dilatation to the left lobe. Mild right lobe of liver biliary dilatation. No signs of choledocholithiasis. 3. Numerous cysts identified within the dominant cyst is centered around the uncinate process measuring 5.2 x 4.6 cm. Findings may reflect mucinous cystic neoplasm versus side branch IPMN. Recommend referral to gastroenterology for further management. 4. Enlarged porta hepatic lymph node measures 1.3 cm. 5. Small bilateral pleural effusions. 6. Lesion off the anterior cortex of the upper pole of the right kidney measures 0.9 cm. 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 Electronically Signed   By: Waddell Calk M.D.   On: 07/02/2023 05:59   MR 3D Recon At Scanner Result Date: 07/02/2023 CLINICAL DATA:  Jaundice. EXAM: MRI ABDOMEN WITHOUT CONTRAST  (INCLUDING MRCP) TECHNIQUE: Multiplanar multisequence MR imaging of the abdomen was performed. Heavily T2-weighted images of the biliary and pancreatic ducts were obtained, and three-dimensional MRCP images were rendered by post processing.  COMPARISON:  Ultrasound 06/29/2023 FINDINGS: Lower chest: Small bilateral pleural effusions. Hepatobiliary: There is abnormal heterogeneous appearance of the liver with multifocal scattered areas of relative increased T2 signal throughout both lobes of liver with increased T2 periportal signal. The gallbladder appears within normal limits. Fusiform dilatation of the common bile duct measures up to 1 cm. There is moderate intrahepatic dilatation to the left lobe. Mild right lobe of liver dilatation. No signs of choledocholithiasis. Pancreas: The main pancreatic duct is prominent measuring up to 4 mm. Multiple scattered areas of side branch duct ectasia identified. Multiple larger cysts are also identified predominantly within the head of pancreas. The dominant cyst is centered around the uncinate process measuring 5.2 x 4.6 cm, image 19/5. No signs of pancreatic inflammation. Spleen: Within normal limits  in size and appearance. Normal adrenal glands. Adrenals/Urinary Tract: Normal adrenal glands. No hydronephrosis. Lesion off the anterior cortex of the upper pole of the right kidney measures 0.9 cm, image 36/8. This is isointense on the coronal T2 haste. On the T2 fat sat sequences there is decreased signal within this lesion. Similarly on the out of phase sequences there is relative decreased signal within this lesion. On the unenhanced pre contrast T1 weighted sequence this is relatively hypointense to the adjacent renal parenchyma. Stomach/Bowel: Visualized portions within the abdomen are unremarkable. Vascular/Lymphatic: Normal caliber of the abdominal aorta. Porta hepatic lymph node measures 1.3 cm, image 18/3. No retroperitoneal adenopathy. Other:  No ascites.  There is edema extending along both flanks. Musculoskeletal: No suspicious bone lesions identified. IMPRESSION: 1. Abnormal heterogeneous appearance of the liver with multifocal scattered areas of relative increased T2 signal throughout both lobes of  liver with increased T2 periportal signal. Imaging findings are nonspecific likely reflect hepatic inflammation, edema, early infiltrative disease or evolving hepatocellular injury. Underlying infiltrative tumor would be difficult to exclude without the benefit of IV contrast material. 2. Fusiform dilatation of the common bile duct measures up to 1 cm. There is moderate intrahepatic dilatation to the left lobe. Mild right lobe of liver biliary dilatation. No signs of choledocholithiasis. 3. Numerous cysts identified within the dominant cyst is centered around the uncinate process measuring 5.2 x 4.6 cm. Findings may reflect mucinous cystic neoplasm versus side branch IPMN. Recommend referral to gastroenterology for further management. 4. Enlarged porta hepatic lymph node measures 1.3 cm. 5. Small bilateral pleural effusions. 6. Lesion off the anterior cortex of the upper pole of the right kidney measures 0.9 cm. 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 Electronically Signed   By: Waddell Calk M.D.   On: 07/02/2023 05:59      Blood pressure 95/60, pulse 69, temperature 97.9 F (36.6 C), temperature source Oral, resp. rate 18, height 5' 3.5 (1.613 m), weight 79.8 kg, SpO2 99%.  Medical Problem List and Plan: 1. Functional deficits secondary to ***  -patient may *** shower  -ELOS/Goals: *** 2.  Antithrombotics: -DVT/anticoagulation:  {VTE PROPHYLAXIS/ANTICOAGULATION - UBEZ:695061}  -antiplatelet therapy: *** 3. Pain Management: *** 4. Mood/Behavior/Sleep: ***  -antipsychotic agents: *** 5. Neuropsych/cognition: This patient *** capable of making decisions on *** own behalf. 6. Skin/Wound Care: *** 7. Fluids/Electrolytes/Nutrition: ***     ***  Daphne LITTIE Finders, NP 07/02/2023

## 2023-07-02 NOTE — Progress Notes (Signed)
 RN informed by IR: Request for perm cath removal ordered by nephrology, it will be done on Monday at the earliest. Patient does not need to be Npo. Thank you.

## 2023-07-03 ENCOUNTER — Inpatient Hospital Stay (HOSPITAL_COMMUNITY): Admission: AD | Admit: 2023-07-03 | Source: Intra-hospital | Admitting: Physical Medicine and Rehabilitation

## 2023-07-03 DIAGNOSIS — Z8673 Personal history of transient ischemic attack (TIA), and cerebral infarction without residual deficits: Secondary | ICD-10-CM | POA: Diagnosis not present

## 2023-07-03 DIAGNOSIS — I10 Essential (primary) hypertension: Secondary | ICD-10-CM | POA: Diagnosis not present

## 2023-07-03 DIAGNOSIS — E785 Hyperlipidemia, unspecified: Secondary | ICD-10-CM | POA: Diagnosis not present

## 2023-07-03 DIAGNOSIS — N179 Acute kidney failure, unspecified: Secondary | ICD-10-CM | POA: Diagnosis not present

## 2023-07-03 LAB — PHOSPHORUS: Phosphorus: 4.5 mg/dL (ref 2.5–4.6)

## 2023-07-03 LAB — CBC WITH DIFFERENTIAL/PLATELET
Abs Immature Granulocytes: 0 10*3/uL (ref 0.00–0.07)
Basophils Absolute: 0.3 10*3/uL — ABNORMAL HIGH (ref 0.0–0.1)
Basophils Relative: 2 %
Eosinophils Absolute: 0.3 10*3/uL (ref 0.0–0.5)
Eosinophils Relative: 2 %
HCT: 38 % (ref 36.0–46.0)
Hemoglobin: 12.8 g/dL (ref 12.0–15.0)
Lymphocytes Relative: 15 %
Lymphs Abs: 2 10*3/uL (ref 0.7–4.0)
MCH: 30.6 pg (ref 26.0–34.0)
MCHC: 33.7 g/dL (ref 30.0–36.0)
MCV: 90.9 fL (ref 80.0–100.0)
Monocytes Absolute: 0.3 10*3/uL (ref 0.1–1.0)
Monocytes Relative: 2 %
Neutro Abs: 10.3 10*3/uL — ABNORMAL HIGH (ref 1.7–7.7)
Neutrophils Relative %: 79 %
Platelets: 133 10*3/uL — ABNORMAL LOW (ref 150–400)
RBC: 4.18 MIL/uL (ref 3.87–5.11)
RDW: 22.9 % — ABNORMAL HIGH (ref 11.5–15.5)
WBC: 13 10*3/uL — ABNORMAL HIGH (ref 4.0–10.5)
nRBC: 0 % (ref 0.0–0.2)
nRBC: 0 /100{WBCs}

## 2023-07-03 LAB — COMPREHENSIVE METABOLIC PANEL WITH GFR
ALT: 451 U/L — ABNORMAL HIGH (ref 0–44)
AST: 677 U/L — ABNORMAL HIGH (ref 15–41)
Albumin: 1.7 g/dL — ABNORMAL LOW (ref 3.5–5.0)
Alkaline Phosphatase: 227 U/L — ABNORMAL HIGH (ref 38–126)
Anion gap: 6 (ref 5–15)
BUN: 57 mg/dL — ABNORMAL HIGH (ref 8–23)
CO2: 23 mmol/L (ref 22–32)
Calcium: 8.7 mg/dL — ABNORMAL LOW (ref 8.9–10.3)
Chloride: 114 mmol/L — ABNORMAL HIGH (ref 98–111)
Creatinine, Ser: 2.09 mg/dL — ABNORMAL HIGH (ref 0.44–1.00)
GFR, Estimated: 24 mL/min — ABNORMAL LOW (ref >60)
Glucose, Bld: 181 mg/dL — ABNORMAL HIGH (ref 70–99)
Potassium: 4.3 mmol/L (ref 3.5–5.1)
Sodium: 143 mmol/L (ref 135–145)
Total Bilirubin: 3.5 mg/dL — ABNORMAL HIGH (ref 0.0–1.2)
Total Protein: 5.4 g/dL — ABNORMAL LOW (ref 6.5–8.1)

## 2023-07-03 LAB — IRON AND TIBC
Iron: 73 ug/dL (ref 28–170)
Saturation Ratios: 31 % (ref 10.4–31.8)
TIBC: 235 ug/dL — ABNORMAL LOW (ref 250–450)
UIBC: 162 ug/dL

## 2023-07-03 LAB — PROTIME-INR
INR: 1.5 — ABNORMAL HIGH (ref 0.8–1.2)
Prothrombin Time: 18.7 s — ABNORMAL HIGH (ref 11.4–15.2)

## 2023-07-03 LAB — FERRITIN: Ferritin: 309 ng/mL — ABNORMAL HIGH (ref 11–307)

## 2023-07-03 LAB — C-REACTIVE PROTEIN: CRP: 8.9 mg/dL — ABNORMAL HIGH (ref <1.0)

## 2023-07-03 LAB — MAGNESIUM: Magnesium: 1.7 mg/dL (ref 1.7–2.4)

## 2023-07-03 LAB — SEDIMENTATION RATE: Sed Rate: 15 mm/h (ref 0–22)

## 2023-07-03 MED ORDER — PHYTONADIONE 5 MG PO TABS
10.0000 mg | ORAL_TABLET | Freq: Once | ORAL | Status: AC
  Administered 2023-07-03: 10 mg via ORAL
  Filled 2023-07-03: qty 2

## 2023-07-03 NOTE — Progress Notes (Signed)
 Progress Note   Subjective  Patient feels okay, tolerating diet.    Objective   Vital signs in last 24 hours: Temp:  [98.2 F (36.8 C)-98.6 F (37 C)] 98.3 F (36.8 C) (06/29 0834) Pulse Rate:  [71-74] 72 (06/29 0834) Resp:  [16-20] 16 (06/29 0920) BP: (96-120)/(54-68) 96/54 (06/29 0834) SpO2:  [97 %-100 %] 99 % (06/29 0834) Weight:  [78.9 kg] 78.9 kg (06/28 2100) Last BM Date : 07/01/23 General:    AA female in NAD Neurologic:  Alert and oriented,  grossly normal neurologically. Psych:  Cooperative. Normal mood and affect.  Intake/Output from previous day: 06/28 0701 - 06/29 0700 In: 840 [P.O.:840] Out: 950 [Urine:950] Intake/Output this shift: Total I/O In: 120 [P.O.:120] Out: 510 [Urine:510]  Lab Results: Recent Labs    07/01/23 1036 07/02/23 0712 07/03/23 0807  WBC 15.1* 15.5* 13.0*  HGB 14.2 13.1 12.8  HCT 41.1 38.4 38.0  PLT 120* 139* 133*   BMET Recent Labs    07/01/23 0652 07/02/23 0712 07/03/23 0807  NA 141 145 143  K 4.2 4.1 4.3  CL 110 115* 114*  CO2 21* 21* 23  GLUCOSE 132* 181* 181*  BUN 72* 69* 57*  CREATININE 2.45* 2.00* 2.09*  CALCIUM  8.6* 8.8* 8.7*   LFT Recent Labs    07/03/23 0807  PROT 5.4*  ALBUMIN 1.7*  AST 677*  ALT 451*  ALKPHOS 227*  BILITOT 3.5*   PT/INR Recent Labs    07/02/23 1543 07/03/23 0807  LABPROT 18.7* 18.7*  INR 1.5* 1.5*    AST 677 High  855 High  897 High   927 High     ALT 451 High  520 High  506 High   448 High     Alkaline Phosphatase 227 High  221 High  248 High   262 High     Total Bilirubin 3.5 High  4.1 High  3.9 High   4.0 High          Studies/Results: MR ABDOMEN MRCP WO CONTRAST Result Date: 07/02/2023 CLINICAL DATA:  Jaundice. EXAM: MRI ABDOMEN WITHOUT CONTRAST  (INCLUDING MRCP) TECHNIQUE: Multiplanar multisequence MR imaging of the abdomen was performed. Heavily T2-weighted images of the biliary and pancreatic ducts were obtained, and three-dimensional MRCP images were  rendered by post processing. COMPARISON:  Ultrasound 06/29/2023 FINDINGS: Lower chest: Small bilateral pleural effusions. Hepatobiliary: There is abnormal heterogeneous appearance of the liver with multifocal scattered areas of relative increased T2 signal throughout both lobes of liver with increased T2 periportal signal. The gallbladder appears within normal limits. Fusiform dilatation of the common bile duct measures up to 1 cm. There is moderate intrahepatic dilatation to the left lobe. Mild right lobe of liver dilatation. No signs of choledocholithiasis. Pancreas: The main pancreatic duct is prominent measuring up to 4 mm. Multiple scattered areas of side branch duct ectasia identified. Multiple larger cysts are also identified predominantly within the head of pancreas. The dominant cyst is centered around the uncinate process measuring 5.2 x 4.6 cm, image 19/5. No signs of pancreatic inflammation. Spleen: Within normal limits in size and appearance. Normal adrenal glands. Adrenals/Urinary Tract: Normal adrenal glands. No hydronephrosis. Lesion off the anterior cortex of the upper pole of the right kidney measures 0.9 cm, image 36/8. This is isointense on the coronal T2 haste. On the T2 fat sat sequences there is decreased signal within this lesion. Similarly on the out of phase sequences there is relative decreased signal within this  lesion. On the unenhanced pre contrast T1 weighted sequence this is relatively hypointense to the adjacent renal parenchyma. Stomach/Bowel: Visualized portions within the abdomen are unremarkable. Vascular/Lymphatic: Normal caliber of the abdominal aorta. Porta hepatic lymph node measures 1.3 cm, image 18/3. No retroperitoneal adenopathy. Other:  No ascites.  There is edema extending along both flanks. Musculoskeletal: No suspicious bone lesions identified. IMPRESSION: 1. Abnormal heterogeneous appearance of the liver with multifocal scattered areas of relative increased T2 signal  throughout both lobes of liver with increased T2 periportal signal. Imaging findings are nonspecific likely reflect hepatic inflammation, edema, early infiltrative disease or evolving hepatocellular injury. Underlying infiltrative tumor would be difficult to exclude without the benefit of IV contrast material. 2. Fusiform dilatation of the common bile duct measures up to 1 cm. There is moderate intrahepatic dilatation to the left lobe. Mild right lobe of liver biliary dilatation. No signs of choledocholithiasis. 3. Numerous cysts identified within the dominant cyst is centered around the uncinate process measuring 5.2 x 4.6 cm. Findings may reflect mucinous cystic neoplasm versus side branch IPMN. Recommend referral to gastroenterology for further management. 4. Enlarged porta hepatic lymph node measures 1.3 cm. 5. Small bilateral pleural effusions. 6. Lesion off the anterior cortex of the upper pole of the right kidney measures 0.9 cm. 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 Electronically Signed   By: Waddell Calk M.D.   On: 07/02/2023 05:59   MR 3D Recon At Scanner Result Date: 07/02/2023 CLINICAL DATA:  Jaundice. EXAM: MRI ABDOMEN WITHOUT CONTRAST  (INCLUDING MRCP) TECHNIQUE: Multiplanar multisequence MR imaging of the abdomen was performed. Heavily T2-weighted images of the biliary and pancreatic ducts were obtained, and three-dimensional MRCP images were rendered by post processing. COMPARISON:  Ultrasound 06/29/2023 FINDINGS: Lower chest: Small bilateral pleural effusions. Hepatobiliary: There is abnormal heterogeneous appearance of the liver with multifocal scattered areas of relative increased T2 signal throughout both lobes of liver with increased T2 periportal signal. The gallbladder appears within normal limits. Fusiform dilatation of the common bile duct measures up to 1 cm. There is moderate intrahepatic dilatation to  the left lobe. Mild right lobe of liver dilatation. No signs of choledocholithiasis. Pancreas: The main pancreatic duct is prominent measuring up to 4 mm. Multiple scattered areas of side branch duct ectasia identified. Multiple larger cysts are also identified predominantly within the head of pancreas. The dominant cyst is centered around the uncinate process measuring 5.2 x 4.6 cm, image 19/5. No signs of pancreatic inflammation. Spleen: Within normal limits in size and appearance. Normal adrenal glands. Adrenals/Urinary Tract: Normal adrenal glands. No hydronephrosis. Lesion off the anterior cortex of the upper pole of the right kidney measures 0.9 cm, image 36/8. This is isointense on the coronal T2 haste. On the T2 fat sat sequences there is decreased signal within this lesion. Similarly on the out of phase sequences there is relative decreased signal within this lesion. On the unenhanced pre contrast T1 weighted sequence this is relatively hypointense to the adjacent renal parenchyma. Stomach/Bowel: Visualized portions within the abdomen are unremarkable. Vascular/Lymphatic: Normal caliber of the abdominal aorta. Porta hepatic lymph node measures 1.3 cm, image 18/3. No retroperitoneal adenopathy. Other:  No ascites.  There is edema extending along both flanks. Musculoskeletal: No suspicious bone lesions identified. IMPRESSION: 1. Abnormal heterogeneous appearance of the liver with multifocal scattered areas of relative increased T2 signal throughout both lobes of liver with increased T2 periportal signal. Imaging findings are nonspecific  likely reflect hepatic inflammation, edema, early infiltrative disease or evolving hepatocellular injury. Underlying infiltrative tumor would be difficult to exclude without the benefit of IV contrast material. 2. Fusiform dilatation of the common bile duct measures up to 1 cm. There is moderate intrahepatic dilatation to the left lobe. Mild right lobe of liver biliary  dilatation. No signs of choledocholithiasis. 3. Numerous cysts identified within the dominant cyst is centered around the uncinate process measuring 5.2 x 4.6 cm. Findings may reflect mucinous cystic neoplasm versus side branch IPMN. Recommend referral to gastroenterology for further management. 4. Enlarged porta hepatic lymph node measures 1.3 cm. 5. Small bilateral pleural effusions. 6. Lesion off the anterior cortex of the upper pole of the right kidney measures 0.9 cm. 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 Electronically Signed   By: Waddell Calk M.D.   On: 07/02/2023 05:59       Assessment / Plan:    78 y/o female here with the following:  Elevated liver enzymes - mixed pattern AKI s/p renal biopsy Dilated bile duct on MRCP Pancreatic cyst  Reviewed the situation with the patient / family today.  Her liver enzymes, specifically AST has down trended nicely since we started checking this.  It remains unclear how long this has been ongoing for, where her peak was etc.  She did have hypotension early in her course, could have resolving shock liver.  Autoimmune hepatitis also on the DDx given positive ANA, smooth muscle antibody, and elevated IgG.  Her EBV IgM was also positive although unclear how specific that is.  The next step to clarify what the etiology is would be with a liver biopsy.  We discussed what this is and risks.  The question is if she would want to proceed with that with her liver enzymes improving.  The liver biopsy would not be done today on a weekend, after full discussion of this they want to see where her labs are tomorrow.  If the enzymes remain stagnant then they would be willing to do a biopsy while inpatient.  If they continue to downtrend steadily then she may want to trend this as outpatient and can do outpatient biopsy if needed.  Have added additional labs to include antimitochondrial  antibody, IgG4, inflammatory markers.  I reviewed her imaging with my advanced endoscopy colleagues, and they did not think she has biliary obstruction causing this pattern of enzyme abnormality.  She has fusiform dilation of the CBD with some left-sided intrahepatic dilation but no obvious obstructive pathology.  She will need an EUS to help evaluate this but they do not recommend ERCP right now.  She also has pancreatic cyst noted incidentally on imaging.  This will require EUS as well to further evaluate, can coordinate as outpatient.  For now, we will trend her labs tomorrow.  Based on results, if continued downtrend she may elect to trend this as outpatient and be discharged.  However if they remain elevated or stagnant she will consider inpatient liver biopsy.  Will follow, call with questions.  Marcey Naval, MD [facility]

## 2023-07-03 NOTE — Progress Notes (Signed)
 Inpatient Rehab Admissions Coordinator:  Dr. Sherrill informed [facility] pt is not medically ready for CIR. Will continue to follow.    Tinnie Yvone Cohens, MS, CCC-SLP Admissions Coordinator [phone]

## 2023-07-03 NOTE — Progress Notes (Signed)
 PROGRESS NOTE    Marissa Orozco  FMW:[id number] DOB: [birth year] DOA: 06/17/2023 PCP: Jarold Medici, MD   Brief Narrative:  Marissa Orozco is an 78 y.o. female past medical history of CVA, essential hypertension hyperlipidemia history of kidney stones coming to the ED complaining of fatigue was found to have low blood pressure, she relates significant poor intake of less than the days, called her PCP to told her to come to the ED.  Was found to be in acute kidney injury and currently undergoing further renal workup.  Had to have a dialysis catheter placed and dialyzed on 06/25/2003.  Concern for glomerulonephritis however per Nephrology, renal biopsy done and revealed chronic changes, AIN and some TMA but no immune complex mediated or crescentic glomerular nephritis noted.  Appears significantly fatigued and deconditioned so PT/OT are recommending CIR.  Incidentally also noted to have abnormal LFTs so we will do further workup for this as well and obtain RUQ U/S which showed Fatty Liver Disease and Acute Hepatitis Panel Negative.  Pursuing further hepatic workup after discussion with GI and GI evaluating and considering liver biopsy if enzymes are stagnant; MRCP done and several labs done concerning for autoimmune hepatitis so GI adding additional labs.   Assessment and Plan:  ATN/ Normal Anion Gap Metabolic Acidosis/ Uremic: Hyperphosphatemia -BUN/Cr Trend: Recent Labs  Lab 06/27/23 0539 06/28/23 0522 06/29/23 0451 06/30/23 0543 07/01/23 9347 07/02/23 0712 07/03/23 0807  BUN 50* 61* 73* 76* 72* 69* 57*  CREATININE 3.46* 3.25* 3.30* 2.66* 2.45* 2.00* 2.09*  -U/A showed white blood cells and protein.  C3 and C4 complements are low, ANA positive as well as p-ANCA 1:160; No M spike on SPEP. -Phos Level went from 5.0 -> 5.2 -> 5.6 and is now 4.3 -Has a slight metabolic acidosis with a CO2 of 21, anion gap of 9, chloride level 150 -Avoid Nephrotoxic Medications, Contrast Dyes, Hypotension  and Dehydration to Ensure Adequate Renal Perfusion and will need to Renally Adjust Meds -Status post renal biopsy on 06/27/2023 by IR as there is concern for Glomerulonephritis.  No biopsy revealed chronic changes, AIN, and some TMA but no immune complex mediated and crescentic glomerulonephritis -Status post dialysis catheter placement and first dialysis in 06/25/2023. Follow ongoing renal recovery. -Continue to Monitor and Trend Renal Function carefully and repeat CMP in the AM  -Further Management per Nephrology; Renal Fxn is improving  -PT/OT consulted and recommending CIR but will hold discharging her to CIR on 6/29 given further evaluation by GI.  Nephrology recommends to discontinue TDC now  Proteinuria: Urine protein/creatinine ratio was 2530 and currently getting workup as above.  She is off of ARB given her AKI and nephrology following closely  Microscopic Hematuria: Likely in the setting of UTI.  Antibiotics have now been discontinued.  She has had renal workup as above   Essential HTN (Hypertension): Holding ARB in the setting of ATN. Blood pressure continues to be stable and now on the softer side. IV Lasix  40 mg BID changing po Furosemide  40 mg po BID. CTM BP per Protcol. Last BP reading was 120/62   Abnormal LFTs / Hyperbilirubinemia: AST is now 927 -> 897 -> 855 -> 677 and ALT is 448 -> 506 -> 520 -> 451. T Bili is now 4.0 (1.9 Indirect, 2.1 Direct) -> 3.9 -> 4.1 -> 3.5 on last check. D/C'd Acetaminophen  given Abnormal LFTs. Checked RUQ U/S and showed Fatty Liver Disease. Acute Hepatitis Panel Negative. CTM and Trend Hepatic Fxn Panel and repeat  CMP in the AM. D/w GI informally and they are recommending checking an IgG level (2038), anti-smooth muscle antibody +, CMV IgM <30.0 and IgG >10.00, Epstein-Barr IgM 56.0, IgG >600, and HSV non-reactive as well as getting a noncontrast MRCP. MRCP done and showed some fusiform dilatation of the common bile duct to 1 cm some intrahepatic dilatation  the left side as well as incidentally noting a 5.2 cm x 4.6 cm pancreatic cyst which will need further evaluation by EUS and GI feels that this can be done in an outpatient setting.  Nephrology is checking iron studies to evaluate for hemochromatosis.  GI formally consulted and they are considering a liver biopsy for further evaluation and discussing with advanced endoscopy colleagues to see what they think about pursuing an ERCP and they do not think that she has a biliary obstruction causing his part of enzyme abnormality and have recommended against ERCP at this time. GI recommends continuing trending LFTs and if her enzymes remain stagnant then they are considering pursuing a biopsy while inpatient but if they continue to downtrend steadily they are recommending trending as an outpatient and continue outpatient as necessary.  They are also checking additional laboratory data including antimitochondrial antibody, IgG4 and inflammatory markers.   Pre-Diabetes: Recent hemoglobin A1c was 6.2 on 06/02/2023.  CTM CBGs per protocol.  Blood Sugars ranging from 97-181 on daily BMP/CMP's.   E. coli UTI: Completed a 7 day course of IV Ceftriaxone . WBC went from 14.4 -> 13.9 -> 15.3 -> 15.1 -> 15.5 -> 13.0. ? Reactive Leukocytosis. CTM and Trend and repeat CBC in the AM.    Hyperlipidemia: Discontinue Ezetimibe  10 mg po Daily. Currently holding Atorvastatin  80 mg po Daily given Abnormal LFTs   History of CVA: Holding Aspirin  81 mg po daily and Atorvastatin  80 mg po Daily.  Glaucoma: C/w Dorzolamide -Timolol  2-0.5% ophthalmic solution 1 drop both Eyes BID and Latanoprost  1 drop Both Eyes qHS   Hypokalemia: K+ is now 4.3. C/w po KCL 20 mEQ BID. CTM and Trend and Replete as Necessary. Goal K+ > 4.0 and Mag > 2.0. Repeat CMP and Mag Level in the AM  Thrombocytopenia: Platelet Count went from 113 -> 107 -> 116 -> 108 -> 130 -> 120 -> 139 -> 133. CTM and for S/Sx of Bleeding; No overt bleeding noted. Repeat CBC in the  AM   Hypoalbuminemia: Patient's Albumin Level went from 2.1 -> 1.9 -> 1.7 -> 1.8 x3 -> 1.7. CTM and Trend and repeat CMP in the AM  Class I Obesity: Complicates overall prognosis and care. Estimated body mass index is 30.33 kg/m as calculated from the following:   Height as of this encounter: 5' 3.5 (1.613 m).   Weight as of this encounter: 78.9 kg. Weight Loss and Dietary Counseling given   DVT prophylaxis: Place and maintain sequential compression device Start: 06/20/23 1531    Code Status: Full Code Family Communication: No family present @ bedside   Disposition Plan:  Level of care: Telemetry Medical Status is: Inpatient Remains inpatient appropriate because: Needs further clinical improvement and clearance by the GI physician prior to going to CIR.  Consideration of getting a renal biopsy if her LFTs and enzymes stay stagnant.   Consultants:  Gastroenterology Nephrology Interventional radiology CIR  Procedures:  As delineated as above  Antimicrobials:  Anti-infectives (From admission, onward)    Start     Dose/Rate Route Frequency Ordered Stop   06/24/23 1430  ceFAZolin  (ANCEF ) IVPB 2g/100 mL premix  2 g 200 mL/hr over 30 Minutes Intravenous  Once 06/24/23 1333 06/25/23 0743   06/24/23 1415  ceFAZolin  (ANCEF ) IVPB 2g/100 mL premix  Status:  Discontinued        2 g 200 mL/hr over 30 Minutes Intravenous  Once 06/24/23 1327 06/24/23 1333   06/19/23 2000  cefTRIAXone  (ROCEPHIN ) 1 g in sodium chloride  0.9 % 100 mL IVPB        1 g 200 mL/hr over 30 Minutes Intravenous Every 24 hours 06/19/23 1928 06/26/23 0748       Subjective: Seen and examined at bedside and was doing okay but still very fatigued.  Eating and drinking okay.  Denies any nausea or vomiting.  Had a bowel movement now.  No lightheadedness or dizziness.  No other concerns or complaints at this time.  Objective: Vitals:   07/02/23 2100 07/03/23 0442 07/03/23 0834 07/03/23 0920  BP:  116/68 (!)  96/54   Pulse:  73 72   Resp:  20 18 16   Temp:  98.6 F (37 C) 98.3 F (36.8 C)   TempSrc:  Oral Oral   SpO2:  97% 99%   Weight: 78.9 kg     Height:        Intake/Output Summary (Last 24 hours) at 07/03/2023 1340 Last data filed at 07/03/2023 1322 Gross per 24 hour  Intake 840 ml  Output 1160 ml  Net -320 ml   Filed Weights   06/26/23 1951 06/27/23 0701 07/02/23 2100  Weight: 81.6 kg 79.8 kg 78.9 kg   Examination: Physical Exam:  Constitutional: WN/WD obese chronically ill-appearing AAF in NAD Respiratory: Diminished to auscultation bilaterally, no wheezing, rales, rhonchi or crackles. Normal respiratory effort and patient is not tachypenic. No accessory muscle use.  Unlabored breathing Cardiovascular: RRR, no murmurs / rubs / gallops. S1 and S2 auscultated.  Some mild lower extremity edema.  Abdomen: Soft, non-tender, distended secondary to body habitus. Bowel sounds positive.  GU: Deferred. Musculoskeletal: No clubbing / cyanosis of digits/nails. No joint deformity upper and lower extremities. Skin: No rashes, lesions, ulcers on a limited skin evaluation. No induration; Warm and dry.  Neurologic: CN 2-12 grossly intact with no focal deficits appreciated. Romberg sign and cerebellar reflexes not assessed.  Psychiatric: Normal judgment and insight. Alert and oriented x 3. Normal mood and appropriate affect.   Data Reviewed: I have personally reviewed following labs and imaging studies  CBC: Recent Labs  Lab 06/29/23 0940 06/30/23 0543 07/01/23 1036 07/02/23 0712 07/03/23 0807  WBC 13.9* 15.3* 15.1* 15.5* 13.0*  NEUTROABS 10.5* 12.9* 11.4* 11.2* 10.3*  HGB 13.8 13.6 14.2 13.1 12.8  HCT 40.0 39.0 41.1 38.4 38.0  MCV 87.0 86.7 88.4 89.9 90.9  PLT 108* 130* 120* 139* 133*   Basic Metabolic Panel: Recent Labs  Lab 06/29/23 0451 06/29/23 0940 06/30/23 0543 07/01/23 0652 07/02/23 0712 07/03/23 0807  NA 139  --  140 141 145 143  K 3.2*  --  4.2 4.2 4.1 4.3  CL  106  --  109 110 115* 114*  CO2 22  --  23 21* 21* 23  GLUCOSE 133*  --  102* 132* 181* 181*  BUN 73*  --  76* 72* 69* 57*  CREATININE 3.30*  --  2.66* 2.45* 2.00* 2.09*  CALCIUM  8.2*  --  8.6* 8.6* 8.8* 8.7*  MG  --  1.9  --  1.9 1.8 1.7  PHOS 5.2*  --  5.6* 4.2 4.3 4.5   GFR: Estimated Creatinine Clearance:  21.9 mL/min (A) (by C-G formula based on SCr of 2.09 mg/dL (H)). Liver Function Tests: Recent Labs  Lab 06/29/23 0940 06/30/23 0543 07/01/23 9347 07/02/23 0712 07/03/23 0807  AST 927*  --  897* 855* 677*  ALT 448*  --  506* 520* 451*  ALKPHOS 262*  --  248* 221* 227*  BILITOT 4.0*  --  3.9* 4.1* 3.5*  PROT 5.5*  --  5.4* 5.6* 5.4*  ALBUMIN 1.8* 1.8* 1.8* 1.8* 1.7*   No results for input(s): LIPASE, AMYLASE in the last 168 hours. No results for input(s): AMMONIA in the last 168 hours. Coagulation Profile: Recent Labs  Lab 06/27/23 0539 07/02/23 1543 07/03/23 0807  INR 1.4* 1.5* 1.5*   Cardiac Enzymes: No results for input(s): CKTOTAL, CKMB, CKMBINDEX, TROPONINI in the last 168 hours. BNP (last 3 results) No results for input(s): PROBNP in the last 8760 hours. HbA1C: No results for input(s): HGBA1C in the last 72 hours. CBG: No results for input(s): GLUCAP in the last 168 hours. Lipid Profile: No results for input(s): CHOL, HDL, LDLCALC, TRIG, CHOLHDL, LDLDIRECT in the last 72 hours. Thyroid  Function Tests: No results for input(s): TSH, T4TOTAL, FREET4, T3FREE, THYROIDAB in the last 72 hours. Anemia Panel: Recent Labs    07/03/23 0807  FERRITIN 309*  TIBC 235*  IRON 73   Sepsis Labs: No results for input(s): PROCALCITON, LATICACIDVEN in the last 168 hours.  Recent Results (from the past 240 hours)  MRSA Next Gen by PCR, Nasal     Status: None   Collection Time: 06/29/23  8:21 AM   Specimen: Nasal Mucosa; Nasal Swab  Result Value Ref Range Status   MRSA by PCR Next Gen NOT DETECTED NOT DETECTED Final     Comment: (NOTE) The GeneXpert MRSA Assay (FDA approved for NASAL specimens only), is one component of a comprehensive MRSA colonization surveillance program. It is not intended to diagnose MRSA infection nor to guide or monitor treatment for MRSA infections. Test performance is not FDA approved in patients less than 64 years old. Performed at [facility], [address], [place], [place] [address]     Radiology Studies: MR ABDOMEN MRCP WO CONTRAST Result Date: 07/02/2023 CLINICAL DATA:  Jaundice. EXAM: MRI ABDOMEN WITHOUT CONTRAST  (INCLUDING MRCP) TECHNIQUE: Multiplanar multisequence MR imaging of the abdomen was performed. Heavily T2-weighted images of the biliary and pancreatic ducts were obtained, and three-dimensional MRCP images were rendered by post processing. COMPARISON:  Ultrasound 06/29/2023 FINDINGS: Lower chest: Small bilateral pleural effusions. Hepatobiliary: There is abnormal heterogeneous appearance of the liver with multifocal scattered areas of relative increased T2 signal throughout both lobes of liver with increased T2 periportal signal. The gallbladder appears within normal limits. Fusiform dilatation of the common bile duct measures up to 1 cm. There is moderate intrahepatic dilatation to the left lobe. Mild right lobe of liver dilatation. No signs of choledocholithiasis. Pancreas: The main pancreatic duct is prominent measuring up to 4 mm. Multiple scattered areas of side branch duct ectasia identified. Multiple larger cysts are also identified predominantly within the head of pancreas. The dominant cyst is centered around the uncinate process measuring 5.2 x 4.6 cm, image 19/5. No signs of pancreatic inflammation. Spleen: Within normal limits in size and appearance. Normal adrenal glands. Adrenals/Urinary Tract: Normal adrenal glands. No hydronephrosis. Lesion off the anterior cortex of the upper pole of the right kidney measures 0.9 cm, image 36/8. This is isointense on  the coronal T2 haste. On the T2 fat  sat sequences there is decreased signal within this lesion. Similarly on the out of phase sequences there is relative decreased signal within this lesion. On the unenhanced pre contrast T1 weighted sequence this is relatively hypointense to the adjacent renal parenchyma. Stomach/Bowel: Visualized portions within the abdomen are unremarkable. Vascular/Lymphatic: Normal caliber of the abdominal aorta. Porta hepatic lymph node measures 1.3 cm, image 18/3. No retroperitoneal adenopathy. Other:  No ascites.  There is edema extending along both flanks. Musculoskeletal: No suspicious bone lesions identified. IMPRESSION: 1. Abnormal heterogeneous appearance of the liver with multifocal scattered areas of relative increased T2 signal throughout both lobes of liver with increased T2 periportal signal. Imaging findings are nonspecific likely reflect hepatic inflammation, edema, early infiltrative disease or evolving hepatocellular injury. Underlying infiltrative tumor would be difficult to exclude without the benefit of IV contrast material. 2. Fusiform dilatation of the common bile duct measures up to 1 cm. There is moderate intrahepatic dilatation to the left lobe. Mild right lobe of liver biliary dilatation. No signs of choledocholithiasis. 3. Numerous cysts identified within the dominant cyst is centered around the uncinate process measuring 5.2 x 4.6 cm. Findings may reflect mucinous cystic neoplasm versus side branch IPMN. Recommend referral to gastroenterology for further management. 4. Enlarged porta hepatic lymph node measures 1.3 cm. 5. Small bilateral pleural effusions. 6. Lesion off the anterior cortex of the upper pole of the right kidney measures 0.9 cm. 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 Electronically Signed   By: Waddell Calk M.D.   On: 07/02/2023 05:59   MR 3D Recon At Scanner Result  Date: 07/02/2023 CLINICAL DATA:  Jaundice. EXAM: MRI ABDOMEN WITHOUT CONTRAST  (INCLUDING MRCP) TECHNIQUE: Multiplanar multisequence MR imaging of the abdomen was performed. Heavily T2-weighted images of the biliary and pancreatic ducts were obtained, and three-dimensional MRCP images were rendered by post processing. COMPARISON:  Ultrasound 06/29/2023 FINDINGS: Lower chest: Small bilateral pleural effusions. Hepatobiliary: There is abnormal heterogeneous appearance of the liver with multifocal scattered areas of relative increased T2 signal throughout both lobes of liver with increased T2 periportal signal. The gallbladder appears within normal limits. Fusiform dilatation of the common bile duct measures up to 1 cm. There is moderate intrahepatic dilatation to the left lobe. Mild right lobe of liver dilatation. No signs of choledocholithiasis. Pancreas: The main pancreatic duct is prominent measuring up to 4 mm. Multiple scattered areas of side branch duct ectasia identified. Multiple larger cysts are also identified predominantly within the head of pancreas. The dominant cyst is centered around the uncinate process measuring 5.2 x 4.6 cm, image 19/5. No signs of pancreatic inflammation. Spleen: Within normal limits in size and appearance. Normal adrenal glands. Adrenals/Urinary Tract: Normal adrenal glands. No hydronephrosis. Lesion off the anterior cortex of the upper pole of the right kidney measures 0.9 cm, image 36/8. This is isointense on the coronal T2 haste. On the T2 fat sat sequences there is decreased signal within this lesion. Similarly on the out of phase sequences there is relative decreased signal within this lesion. On the unenhanced pre contrast T1 weighted sequence this is relatively hypointense to the adjacent renal parenchyma. Stomach/Bowel: Visualized portions within the abdomen are unremarkable. Vascular/Lymphatic: Normal caliber of the abdominal aorta. Porta hepatic lymph node measures 1.3  cm, image 18/3. No retroperitoneal adenopathy. Other:  No ascites.  There is edema extending along both flanks. Musculoskeletal: No suspicious bone lesions identified. IMPRESSION: 1. Abnormal heterogeneous appearance of the liver  with multifocal scattered areas of relative increased T2 signal throughout both lobes of liver with increased T2 periportal signal. Imaging findings are nonspecific likely reflect hepatic inflammation, edema, early infiltrative disease or evolving hepatocellular injury. Underlying infiltrative tumor would be difficult to exclude without the benefit of IV contrast material. 2. Fusiform dilatation of the common bile duct measures up to 1 cm. There is moderate intrahepatic dilatation to the left lobe. Mild right lobe of liver biliary dilatation. No signs of choledocholithiasis. 3. Numerous cysts identified within the dominant cyst is centered around the uncinate process measuring 5.2 x 4.6 cm. Findings may reflect mucinous cystic neoplasm versus side branch IPMN. Recommend referral to gastroenterology for further management. 4. Enlarged porta hepatic lymph node measures 1.3 cm. 5. Small bilateral pleural effusions. 6. Lesion off the anterior cortex of the upper pole of the right kidney measures 0.9 cm. 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 Electronically Signed   By: Waddell Calk M.D.   On: 07/02/2023 05:59   Scheduled Meds:  Chlorhexidine  Gluconate Cloth  6 each Topical Q0600   Chlorhexidine  Gluconate Cloth  6 each Topical Q0600   docusate sodium   100 mg Oral BID   dorzolamide -timolol   1 drop Both Eyes BID   furosemide   40 mg Oral BID   latanoprost   1 drop Both Eyes QHS   phytonadione  10 mg Oral Once   potassium chloride   20 mEq Oral BID   Continuous Infusions:   LOS: 15 days   Alejandro Marker, DO [facility] Available via Epic secure chat 7am-7pm After these hours, please refer to  coverage provider listed on [email or web address] 07/03/2023, 1:40 PM

## 2023-07-03 NOTE — Plan of Care (Signed)
  Problem: Health Behavior/Discharge Planning: Goal: Ability to manage health-related needs will improve Outcome: Progressing   Problem: Clinical Measurements: Goal: Ability to maintain clinical measurements within normal limits will improve Outcome: Progressing Goal: Will remain free from infection Outcome: Progressing   Problem: Activity: Goal: Risk for activity intolerance will decrease Outcome: Progressing   Problem: Nutrition: Goal: Adequate nutrition will be maintained Outcome: Progressing   Problem: Coping: Goal: Level of anxiety will decrease Outcome: Progressing   Problem: Elimination: Goal: Will not experience complications related to bowel motility Outcome: Progressing Goal: Will not experience complications related to urinary retention Outcome: Progressing   Problem: Pain Managment: Goal: General experience of comfort will improve and/or be controlled Outcome: Progressing   Problem: Safety: Goal: Ability to remain free from injury will improve Outcome: Progressing   Problem: Skin Integrity: Goal: Risk for impaired skin integrity will decrease Outcome: Progressing   Problem: Urinary Elimination: Goal: Signs and symptoms of infection will decrease Outcome: Progressing   Problem: Education: Goal: Knowledge of disease and its progression will improve Outcome: Progressing   Problem: Health Behavior/Discharge Planning: Goal: Ability to manage health-related needs will improve Outcome: Progressing   Problem: Clinical Measurements: Goal: Complications related to the disease process or treatment will be avoided or minimized Outcome: Progressing Goal: Dialysis access will remain free of complications Outcome: Progressing   Problem: Activity: Goal: Activity intolerance will improve Outcome: Progressing   Problem: Fluid Volume: Goal: Fluid volume balance will be maintained or improved Outcome: Progressing   Problem: Nutritional: Goal: Ability to make  appropriate dietary choices will improve Outcome: Progressing   Problem: Respiratory: Goal: Respiratory symptoms related to disease process will be avoided Outcome: Progressing   Problem: Self-Concept: Goal: Body image disturbance will be avoided or minimized Outcome: Progressing   Problem: Urinary Elimination: Goal: Progression of disease will be identified and treated Outcome: Progressing   Problem: Education: Goal: Knowledge of General Education information will improve Description: Including pain rating scale, medication(s)/side effects and non-pharmacologic comfort measures Outcome: Progressing   Problem: Health Behavior/Discharge Planning: Goal: Ability to manage health-related needs will improve Outcome: Progressing   Problem: Clinical Measurements: Goal: Ability to maintain clinical measurements within normal limits will improve Outcome: Progressing Goal: Will remain free from infection Outcome: Progressing Goal: Diagnostic test results will improve Outcome: Progressing Goal: Respiratory complications will improve Outcome: Progressing Goal: Cardiovascular complication will be avoided Outcome: Progressing   Problem: Activity: Goal: Risk for activity intolerance will decrease Outcome: Progressing   Problem: Nutrition: Goal: Adequate nutrition will be maintained Outcome: Progressing   Problem: Coping: Goal: Level of anxiety will decrease Outcome: Progressing   Problem: Elimination: Goal: Will not experience complications related to bowel motility Outcome: Progressing Goal: Will not experience complications related to urinary retention Outcome: Progressing   Problem: Pain Managment: Goal: General experience of comfort will improve and/or be controlled Outcome: Progressing   Problem: Safety: Goal: Ability to remain free from injury will improve Outcome: Progressing   Problem: Skin Integrity: Goal: Risk for impaired skin integrity will  decrease Outcome: Progressing

## 2023-07-03 NOTE — Progress Notes (Signed)
 Mobility Specialist: Progress Note   07/03/23 1018  Mobility  Activity Ambulated with assistance in room  Level of Assistance Minimal assist, patient does 75% or more  Assistive Device Front wheel walker  Distance Ambulated (ft) 10 ft  Activity Response Tolerated well  Mobility Referral Yes  Mobility visit 1 Mobility  Mobility Specialist Start Time (ACUTE ONLY) 0945  Mobility Specialist Stop Time (ACUTE ONLY) 1002  Mobility Specialist Time Calculation (min) (ACUTE ONLY) 17 min    Pt received on BSC, agreeable to mobility session. MS assisted NT with pericare and bath, then pt stood with minA from [facility]. Ambulated around the bed with minA. Pt got very fatigued by the time she reached to the other side of the bed and was ambulating in a flexed position. Pt also took 4 side steps towards the [facility]. No other complaints stated besides fatigue. MinA for sit>supine. Left in bed with all needs met, call bell in reach. NT present.   Ileana Lute Mobility Specialist Please contact via SecureChat or Rehab office at [phone]

## 2023-07-04 ENCOUNTER — Inpatient Hospital Stay (HOSPITAL_COMMUNITY)

## 2023-07-04 ENCOUNTER — Inpatient Hospital Stay: Admitting: Internal Medicine

## 2023-07-04 DIAGNOSIS — K862 Cyst of pancreas: Secondary | ICD-10-CM

## 2023-07-04 DIAGNOSIS — N179 Acute kidney failure, unspecified: Secondary | ICD-10-CM | POA: Diagnosis not present

## 2023-07-04 DIAGNOSIS — I10 Essential (primary) hypertension: Secondary | ICD-10-CM | POA: Diagnosis not present

## 2023-07-04 DIAGNOSIS — Z8673 Personal history of transient ischemic attack (TIA), and cerebral infarction without residual deficits: Secondary | ICD-10-CM | POA: Diagnosis not present

## 2023-07-04 DIAGNOSIS — E785 Hyperlipidemia, unspecified: Secondary | ICD-10-CM | POA: Diagnosis not present

## 2023-07-04 DIAGNOSIS — R748 Abnormal levels of other serum enzymes: Secondary | ICD-10-CM

## 2023-07-04 HISTORY — PX: IR REMOVAL TUN CV CATH W/O FL: IMG2289

## 2023-07-04 LAB — CBC WITH DIFFERENTIAL/PLATELET
Abs Immature Granulocytes: 0.09 10*3/uL — ABNORMAL HIGH (ref 0.00–0.07)
Basophils Absolute: 0.1 10*3/uL (ref 0.0–0.1)
Basophils Relative: 1 %
Eosinophils Absolute: 0.3 10*3/uL (ref 0.0–0.5)
Eosinophils Relative: 3 %
HCT: 36.7 % (ref 36.0–46.0)
Hemoglobin: 12.7 g/dL (ref 12.0–15.0)
Immature Granulocytes: 1 %
Lymphocytes Relative: 22 %
Lymphs Abs: 2.9 10*3/uL (ref 0.7–4.0)
MCH: 31.4 pg (ref 26.0–34.0)
MCHC: 34.6 g/dL (ref 30.0–36.0)
MCV: 90.8 fL (ref 80.0–100.0)
Monocytes Absolute: 1.2 10*3/uL — ABNORMAL HIGH (ref 0.1–1.0)
Monocytes Relative: 9 %
Neutro Abs: 8.2 10*3/uL — ABNORMAL HIGH (ref 1.7–7.7)
Neutrophils Relative %: 64 %
Platelets: 129 10*3/uL — ABNORMAL LOW (ref 150–400)
RBC: 4.04 MIL/uL (ref 3.87–5.11)
RDW: 23.1 % — ABNORMAL HIGH (ref 11.5–15.5)
WBC: 12.9 10*3/uL — ABNORMAL HIGH (ref 4.0–10.5)
nRBC: 0 % (ref 0.0–0.2)

## 2023-07-04 LAB — COMPREHENSIVE METABOLIC PANEL WITH GFR
ALT: 456 U/L — ABNORMAL HIGH (ref 0–44)
AST: 676 U/L — ABNORMAL HIGH (ref 15–41)
Albumin: 1.7 g/dL — ABNORMAL LOW (ref 3.5–5.0)
Alkaline Phosphatase: 221 U/L — ABNORMAL HIGH (ref 38–126)
Anion gap: 9 (ref 5–15)
BUN: 58 mg/dL — ABNORMAL HIGH (ref 8–23)
CO2: 18 mmol/L — ABNORMAL LOW (ref 22–32)
Calcium: 8.7 mg/dL — ABNORMAL LOW (ref 8.9–10.3)
Chloride: 116 mmol/L — ABNORMAL HIGH (ref 98–111)
Creatinine, Ser: 1.94 mg/dL — ABNORMAL HIGH (ref 0.44–1.00)
GFR, Estimated: 26 mL/min — ABNORMAL LOW
Glucose, Bld: 85 mg/dL (ref 70–99)
Potassium: 4.7 mmol/L (ref 3.5–5.1)
Sodium: 143 mmol/L (ref 135–145)
Total Bilirubin: 3.8 mg/dL — ABNORMAL HIGH (ref 0.0–1.2)
Total Protein: 5.3 g/dL — ABNORMAL LOW (ref 6.5–8.1)

## 2023-07-04 LAB — CANCER ANTIGEN 19-9: CA 19-9: 139 U/mL — ABNORMAL HIGH (ref 0–35)

## 2023-07-04 LAB — AFP TUMOR MARKER: AFP, Serum, Tumor Marker: 8.2 ng/mL (ref 0.0–9.2)

## 2023-07-04 LAB — IGG 4: IgG, Subclass 4: 58 mg/dL (ref 2–96)

## 2023-07-04 LAB — MAGNESIUM: Magnesium: 1.8 mg/dL (ref 1.7–2.4)

## 2023-07-04 LAB — PROTIME-INR
INR: 1.5 — ABNORMAL HIGH (ref 0.8–1.2)
Prothrombin Time: 18.9 s — ABNORMAL HIGH (ref 11.4–15.2)

## 2023-07-04 LAB — MITOCHONDRIAL ANTIBODIES: Mitochondrial M2 Ab, IgG: <20 U (ref 0.0–20.0)

## 2023-07-04 LAB — PHOSPHORUS: Phosphorus: 4.7 mg/dL — ABNORMAL HIGH (ref 2.5–4.6)

## 2023-07-04 MED ORDER — GELATIN ABSORBABLE 12-7 MM EX MISC
1.0000 | Freq: Once | CUTANEOUS | Status: AC
  Administered 2023-07-04: 1 via TOPICAL
  Filled 2023-07-04: qty 1

## 2023-07-04 MED ORDER — LIDOCAINE HCL (PF) 1 % IJ SOLN
10.0000 mL | Freq: Once | INTRAMUSCULAR | Status: AC
  Administered 2023-07-04: 10 mL via INTRADERMAL

## 2023-07-04 MED ORDER — FENTANYL CITRATE (PF) 100 MCG/2ML IJ SOLN
INTRAMUSCULAR | Status: AC
  Filled 2023-07-04: qty 2

## 2023-07-04 MED ORDER — FENTANYL CITRATE (PF) 100 MCG/2ML IJ SOLN
INTRAMUSCULAR | Status: AC | PRN
  Administered 2023-07-04: 50 ug via INTRAVENOUS
  Administered 2023-07-04: 25 ug via INTRAVENOUS

## 2023-07-04 MED ORDER — MIDAZOLAM HCL 2 MG/2ML IJ SOLN
INTRAMUSCULAR | Status: AC
  Filled 2023-07-04: qty 2

## 2023-07-04 MED ORDER — MIDAZOLAM HCL 2 MG/2ML IJ SOLN
INTRAMUSCULAR | Status: AC | PRN
  Administered 2023-07-04: 1 mg via INTRAVENOUS

## 2023-07-04 NOTE — Procedures (Signed)
 PROCEDURE SUMMARY:  Successful removal of tunneled hemodialysis catheter.  Patient tolerated well.  EBL < 5 mL  See full dictation in Imaging for details.  Steffan Caniglia H Alpha Mysliwiec PA-C 07/04/2023 7:17 AM

## 2023-07-04 NOTE — Consult Note (Signed)
 Chief Complaint: Patient was seen in consultation today for elevated LFT Chief Complaint  Patient presents with   Hypotension   at the request of Leigh Standing   Referring Physician(s): Leigh Standing   Supervising Physician: Luverne Aran  Patient Status: [facility] - In-pt  History of Present Illness: Marissa Orozco is a 78 y.o. female with PMHs of heart murmur, hyperlipidemia, hypertension, kidney stones, neuromuscular disorder, AKI s/p random renal bx and HD, and elevated LFT, IR was consulted for random liver bx.   Patient is known to IR for random renal bx, tunneled HD cath placement and removal.   GI has been following the patient for elevated LFT, US  RUQ on 6/25 showed fatty liver w/o focal abnormality, MR MRCP on 6/28 showed:  1. Abnormal heterogeneous appearance of the liver with multifocal scattered areas of relative increased T2 signal throughout both lobes of liver with increased T2 periportal signal. Imaging findings are nonspecific likely reflect hepatic inflammation, edema, early infiltrative disease or evolving hepatocellular injury. Underlying infiltrative tumor would be difficult to exclude without the benefit of IV contrast material. 2. Fusiform dilatation of the common bile duct measures up to 1 cm. There is moderate intrahepatic dilatation to the left lobe. Mild right lobe of liver biliary dilatation. No signs of choledocholithiasis. 3. Numerous cysts identified within the dominant cyst is centered around the uncinate process measuring 5.2 x 4.6 cm. Findings may reflect mucinous cystic neoplasm versus side branch IPMN. Recommend referral to gastroenterology for further management. 4. Enlarged porta hepatic lymph node measures 1.3 cm. 5. Small bilateral pleural effusions. 6. Lesion off the anterior cortex of the upper pole of the right kidney measures 0.9 cm. This is favored to represent a small angiomyolipoma but is technically incompletely  characterized without IV contrast.   Serologic work up  raised concern for autoimmune hepatitis. IR was requested for random liver bx.   Patient laying in bed, not in acute distress.  Denise headache, fever, chills, shortness of breath, cough, chest pain, abdominal pain, nausea ,vomiting, and bleeding.    Past Medical History:  Diagnosis Date   Allergy 2000   Breast lump    Cataract    Colon polyps    Fibroids    GERD (gastroesophageal reflux disease)    Glaucoma    Heart murmur    Hyperlipidemia 01/05/1984   Hypertension 01/05/1984   Interstitial cystitis 01/04/1997   Kidney stones 01/05/2003   Neuromuscular disorder (HCC)    Osteoporosis    Preop cardiovascular exam 10/25/2022   Sleep apnea 2021   Tonsillitis     Past Surgical History:  Procedure Laterality Date   ABDOMINAL HYSTERECTOMY  1997   BREAST EXCISIONAL BIOPSY Right 07/27/2006   Intraductal Papilloma   BREAST SURGERY  2008   CATARACT EXTRACTION Bilateral 02/2020   other in 05/2020   COLONOSCOPY  2010   Dr Kristie   EYE SURGERY  2022   HYSTEROSCOPY  1993   IR FLUORO GUIDE CV LINE RIGHT  06/24/2023   IR REMOVAL TUN CV CATH W/O FL  07/04/2023   IR US  GUIDE VASC ACCESS RIGHT  06/24/2023   LAPAROSCOPIC HYSTERECTOMY  1994   MYOMECTOMY  1993   excision of tumor   POLYPECTOMY     TONSILLECTOMY  1953   TUBAL LIGATION  1980    Allergies: Hydrochlorothiazide  Medications: Prior to Admission medications   Medication Sig Start Date End Date Taking? Authorizing Provider  amLODipine  (NORVASC ) 5 MG tablet TAKE 1 TABLET BY  MOUTH DAILY Patient taking differently: Take 5 mg by mouth at bedtime. 11/22/22  Yes Jarold Medici, MD  aspirin  EC 81 MG tablet Take 81 mg by mouth daily. Swallow whole.   Yes [provider]  atorvastatin  (LIPITOR) 80 MG tablet TAKE 1 TABLET BY MOUTH DAILY 09/08/22  Yes Jarold Medici, MD  Calcium  Carbonate (CALCIUM  500 PO) Take 500 mg by mouth daily with breakfast.   Yes [provider]  Cholecalciferol (VITAMIN D3) 50 MCG (2000 UT) TABS Take 2,000 Units by mouth daily with breakfast.   Yes [provider]  dorzolamide -timolol  (COSOPT ) 22.3-6.8 MG/ML ophthalmic solution Place 1 drop into both eyes in the morning and at bedtime. 02/02/20  Yes [provider]  ezetimibe  (ZETIA ) 10 MG tablet Take 1 tablet (10 mg total) by mouth daily. 02/16/23  Yes Jarold Medici, MD  finasteride (PROSCAR) 5 MG tablet Take 5 mg by mouth daily.   Yes [provider]  latanoprost  (XALATAN ) 0.005 % ophthalmic solution Place 1 drop into both eyes at bedtime. 03/17/13  Yes [provider]  valsartan  (DIOVAN ) 160 MG tablet Take 1 tablet (160 mg total) by mouth daily. 02/16/23 02/16/24 Yes Jarold Medici, MD     Family History  Problem Relation Age of Onset   Hypertension Mother    Heart disease Mother    Colon cancer Mother 71   Arthritis Mother    Cancer Mother    Coronary artery disease Father        mother, PGM   Stroke Father    Hypertension Father    Colon cancer Father 48   Cancer Father    Colon cancer Maternal Grandmother    Diabetes Other    Breast cancer Neg Hx    Colon polyps Neg Hx    Esophageal cancer Neg Hx    Stomach cancer Neg Hx    Rectal cancer Neg Hx    BRCA 1/2 Neg Hx     Social History   Socioeconomic History   Marital status: Married    Spouse name: Not on file   Number of children: 2   Years of education: Not on file   Highest education level: Doctorate  Occupational History   Occupation: retired   Occupation: retired  Tobacco Use   Smoking status: Never   Smokeless tobacco: Never  Vaping Use   Vaping status: Never Used  Substance and Sexual Activity   Alcohol use: Yes    Alcohol/week: 5.0 standard drinks of alcohol    Types: 5 Glasses of wine per week   Drug use: No   Sexual activity: Not Currently  Other Topics Concern   Not on file  Social History Narrative   Not on file   Social Drivers of  Health   Financial Resource Strain: Low Risk  (02/12/2023)   Overall Financial Resource Strain (CARDIA)    Difficulty of Paying Living Expenses: Not hard at all  Food Insecurity: No Food Insecurity (06/17/2023)   Hunger Vital Sign    Worried About Running Out of Food in the Last Year: Never true    Ran Out of Food in the Last Year: Never true  Transportation Needs: No Transportation Needs (06/17/2023)   PRAPARE - Administrator, Civil Service (Medical): No    Lack of Transportation (Non-Medical): No  Physical Activity: Sufficiently Active (02/12/2023)   Exercise Vital Sign    Days of Exercise per Week: 5 days    Minutes of Exercise per Session:  40 min  Stress: No Stress Concern Present (02/12/2023)   [employer] of Occupational Health - Occupational Stress Questionnaire    Feeling of Stress : Not at all  Social Connections: Moderately Isolated (06/17/2023)   Social Connection and Isolation Panel    Frequency of Communication with Friends and Family: More than three times a week    Frequency of Social Gatherings with Friends and Family: Once a week    Attends Religious Services: Never    Database administrator or Organizations: No    Attends Engineer, structural: Never    Marital Status: Married     Review of Systems: A 12 point ROS discussed and pertinent positives are indicated in the HPI above.  All other systems are negative.  Vital Signs: BP 116/62 (BP Location: Right Arm)   Pulse 75   Temp 98 F (36.7 C) (Oral)   Resp 19   Ht 5' 3.5 (1.613 m)   Wt 173 lb 15.1 oz (78.9 kg)   SpO2 98%   BMI 30.33 kg/m    Physical Exam Vitals and nursing note reviewed.  Constitutional:      General: Patient is not in acute distress.    Appearance: Normal appearance. Patient is not ill-appearing.  HENT:     Head: Normocephalic and atraumatic.     Mouth/Throat:     Mouth: Mucous membranes are moist.     Pharynx: Oropharynx is clear.  Cardiovascular:     Rate  and Rhythm: Normal rate and regular rhythm.     Pulses: Normal pulses.     Heart sounds: Normal heart sounds.  Pulmonary:     Effort: Pulmonary effort is normal.     Breath sounds: Normal breath sounds.  Abdominal:     General: Abdomen is flat. Bowel sounds are normal.     Palpations: Abdomen is soft.  Musculoskeletal:     Cervical back: Neck supple.  Skin:    General: Skin is warm and dry.     Coloration: Skin is not jaundiced or pale.  Neurological:     Mental Status: Patient is alert and oriented to person, place, and time.  Psychiatric:        Mood and Affect: Mood normal.        Behavior: Behavior normal.        Judgment: Judgment normal.    MD Evaluation Airway: WNL Heart: WNL Abdomen: WNL Chest/ Lungs: WNL Chest/ lungs comments: dyspnea with exertion Other Pertinent Findings: generalized weakness ASA  Classification: 3 Mallampati/Airway Score: Two  Imaging: IR Removal Tun Cv Cath W/O FL Result Date: 07/04/2023 INDICATION: 78 year old female with history of AKI requiring dialysis, status post tunneled dialysis catheter placement 06/27/2023 presents with recovery of renal function. Request for tunneled dialysis catheter removal. EXAM: REMOVAL OF TUNNELED HEMODIALYSIS CATHETER MEDICATIONS: None COMPLICATIONS: None immediate. PROCEDURE: Informed written consent was obtained from the patient following an explanation of the procedure, risks, benefits and alternatives to treatment. A time out was performed prior to the initiation of the procedure. Sterile technique was utilized including mask, sterile gloves, sterile drape, and hand hygiene. ChloraPrep was used to prep the patient's right neck, chest and existing catheter. 1% lidocaine  was injected around the catheter and the subcutaneous tunnel. The catheter was removed intact by applying gentle manual retraction. Hemostasis was obtained with manual compression. A dressing was placed. The patient tolerated the procedure well  without immediate post procedural complication. IMPRESSION: Successful removal of tunneled dialysis catheter. Performed by: Orelia Brandstetter  [place], PA-C Electronically Signed   By: Marcey Moan M.D.   On: 07/04/2023 09:00   MR ABDOMEN MRCP WO CONTRAST Result Date: 07/02/2023 CLINICAL DATA:  Jaundice. EXAM: MRI ABDOMEN WITHOUT CONTRAST  (INCLUDING MRCP) TECHNIQUE: Multiplanar multisequence MR imaging of the abdomen was performed. Heavily T2-weighted images of the biliary and pancreatic ducts were obtained, and three-dimensional MRCP images were rendered by post processing. COMPARISON:  Ultrasound 06/29/2023 FINDINGS: Lower chest: Small bilateral pleural effusions. Hepatobiliary: There is abnormal heterogeneous appearance of the liver with multifocal scattered areas of relative increased T2 signal throughout both lobes of liver with increased T2 periportal signal. The gallbladder appears within normal limits. Fusiform dilatation of the common bile duct measures up to 1 cm. There is moderate intrahepatic dilatation to the left lobe. Mild right lobe of liver dilatation. No signs of choledocholithiasis. Pancreas: The main pancreatic duct is prominent measuring up to 4 mm. Multiple scattered areas of side branch duct ectasia identified. Multiple larger cysts are also identified predominantly within the head of pancreas. The dominant cyst is centered around the uncinate process measuring 5.2 x 4.6 cm, image 19/5. No signs of pancreatic inflammation. Spleen: Within normal limits in size and appearance. Normal adrenal glands. Adrenals/Urinary Tract: Normal adrenal glands. No hydronephrosis. Lesion off the anterior cortex of the upper pole of the right kidney measures 0.9 cm, image 36/8. This is isointense on the coronal T2 haste. On the T2 fat sat sequences there is decreased signal within this lesion. Similarly on the out of phase sequences there is relative decreased signal within this lesion. On the unenhanced pre contrast T1  weighted sequence this is relatively hypointense to the adjacent renal parenchyma. Stomach/Bowel: Visualized portions within the abdomen are unremarkable. Vascular/Lymphatic: Normal caliber of the abdominal aorta. Porta hepatic lymph node measures 1.3 cm, image 18/3. No retroperitoneal adenopathy. Other:  No ascites.  There is edema extending along both flanks. Musculoskeletal: No suspicious bone lesions identified. IMPRESSION: 1. Abnormal heterogeneous appearance of the liver with multifocal scattered areas of relative increased T2 signal throughout both lobes of liver with increased T2 periportal signal. Imaging findings are nonspecific likely reflect hepatic inflammation, edema, early infiltrative disease or evolving hepatocellular injury. Underlying infiltrative tumor would be difficult to exclude without the benefit of IV contrast material. 2. Fusiform dilatation of the common bile duct measures up to 1 cm. There is moderate intrahepatic dilatation to the left lobe. Mild right lobe of liver biliary dilatation. No signs of choledocholithiasis. 3. Numerous cysts identified within the dominant cyst is centered around the uncinate process measuring 5.2 x 4.6 cm. Findings may reflect mucinous cystic neoplasm versus side branch IPMN. Recommend referral to gastroenterology for further management. 4. Enlarged porta hepatic lymph node measures 1.3 cm. 5. Small bilateral pleural effusions. 6. Lesion off the anterior cortex of the upper pole of the right kidney measures 0.9 cm. 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 Electronically Signed   By: Waddell Calk M.D.   On: 07/02/2023 05:59   MR 3D Recon At Scanner Result Date: 07/02/2023 CLINICAL DATA:  Jaundice. EXAM: MRI ABDOMEN WITHOUT CONTRAST  (INCLUDING MRCP) TECHNIQUE: Multiplanar multisequence MR imaging of the abdomen was performed. Heavily T2-weighted images of the biliary and  pancreatic ducts were obtained, and three-dimensional MRCP images were rendered by post processing. COMPARISON:  Ultrasound 06/29/2023 FINDINGS: Lower chest: Small bilateral pleural effusions. Hepatobiliary: There is abnormal heterogeneous appearance of the liver with multifocal scattered areas of relative  increased T2 signal throughout both lobes of liver with increased T2 periportal signal. The gallbladder appears within normal limits. Fusiform dilatation of the common bile duct measures up to 1 cm. There is moderate intrahepatic dilatation to the left lobe. Mild right lobe of liver dilatation. No signs of choledocholithiasis. Pancreas: The main pancreatic duct is prominent measuring up to 4 mm. Multiple scattered areas of side branch duct ectasia identified. Multiple larger cysts are also identified predominantly within the head of pancreas. The dominant cyst is centered around the uncinate process measuring 5.2 x 4.6 cm, image 19/5. No signs of pancreatic inflammation. Spleen: Within normal limits in size and appearance. Normal adrenal glands. Adrenals/Urinary Tract: Normal adrenal glands. No hydronephrosis. Lesion off the anterior cortex of the upper pole of the right kidney measures 0.9 cm, image 36/8. This is isointense on the coronal T2 haste. On the T2 fat sat sequences there is decreased signal within this lesion. Similarly on the out of phase sequences there is relative decreased signal within this lesion. On the unenhanced pre contrast T1 weighted sequence this is relatively hypointense to the adjacent renal parenchyma. Stomach/Bowel: Visualized portions within the abdomen are unremarkable. Vascular/Lymphatic: Normal caliber of the abdominal aorta. Porta hepatic lymph node measures 1.3 cm, image 18/3. No retroperitoneal adenopathy. Other:  No ascites.  There is edema extending along both flanks. Musculoskeletal: No suspicious bone lesions identified. IMPRESSION: 1. Abnormal heterogeneous appearance of  the liver with multifocal scattered areas of relative increased T2 signal throughout both lobes of liver with increased T2 periportal signal. Imaging findings are nonspecific likely reflect hepatic inflammation, edema, early infiltrative disease or evolving hepatocellular injury. Underlying infiltrative tumor would be difficult to exclude without the benefit of IV contrast material. 2. Fusiform dilatation of the common bile duct measures up to 1 cm. There is moderate intrahepatic dilatation to the left lobe. Mild right lobe of liver biliary dilatation. No signs of choledocholithiasis. 3. Numerous cysts identified within the dominant cyst is centered around the uncinate process measuring 5.2 x 4.6 cm. Findings may reflect mucinous cystic neoplasm versus side branch IPMN. Recommend referral to gastroenterology for further management. 4. Enlarged porta hepatic lymph node measures 1.3 cm. 5. Small bilateral pleural effusions. 6. Lesion off the anterior cortex of the upper pole of the right kidney measures 0.9 cm. 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 Electronically Signed   By: Waddell Calk M.D.   On: 07/02/2023 05:59   US  Abdomen Limited RUQ (LIVER/GB) Result Date: 06/29/2023 CLINICAL DATA:  Abnormal LFTs EXAM: ULTRASOUND ABDOMEN LIMITED RIGHT UPPER QUADRANT COMPARISON:  None Available. FINDINGS: Gallbladder: No gallstones or wall thickening visualized. No sonographic Murphy sign noted by sonographer. Common bile duct: Diameter: 5.5 mm Liver: Mildly increased in echogenicity consistent with fatty infiltration. No focal mass is seen. Portal vein is patent on color Doppler imaging with normal direction of blood flow towards the liver. Other: None. IMPRESSION: Fatty liver.  No other focal abnormality is seen. Electronically Signed   By: Oneil Devonshire M.D.   On: 06/29/2023 20:29   IR Fluoro Guide CV Line Right Result Date:  06/27/2023 CLINICAL DATA:  Renal insufficiency, needs access for hemodialysis EXAM: TUNNELED HEMODIALYSIS CATHETER PLACEMENT WITH ULTRASOUND AND FLUOROSCOPIC GUIDANCE TECHNIQUE: The procedure, risks, benefits, and alternatives were explained to the patient. Questions regarding the procedure were encouraged and answered. The patient understands and consents to the procedure. As antibiotic prophylaxis, cefazolin  2 g was ordered pre-procedure and  administered intravenously within one hour of incision.Patency of the right IJ vein was confirmed with ultrasound with image documentation. An appropriate skin site was determined. Region was prepped using maximum barrier technique including cap and mask, sterile gown, sterile gloves, large sterile sheet, and Chlorhexidine  as cutaneous antisepsis. The region was infiltrated locally with 1% lidocaine . Intravenous Fentanyl  50mcg and Versed  1mg  were administered by RN during a total moderate (conscious) sedation time of 12 minutes; the patient's level of consciousness and physiological / cardiorespiratory status were monitored continuously by radiology RN under my direct supervision. Under real-time ultrasound guidance, the right IJ vein was accessed with a 21 gauge micropuncture needle; the needle tip within the vein was confirmed with ultrasound image documentation. Needle exchanged over the 018 guidewire for transitional dilator, which allowed advancement of a Benson wire into the IVC. Over this, an MPA catheter was advanced. A Palindrome 19 hemodialysis catheter was tunneled from the right anterior chest wall approach to the right IJ dermatotomy site. The MPA catheter was exchanged over an Amplatz wire for serial vascular dilators which allow placement of a peel-away sheath, through which the catheter was advanced under intermittent fluoroscopy, positioned with its tips in the proximal and midright atrium. Spot chest radiograph confirms good catheter position. No  pneumothorax. Catheter was flushed and primed per protocol. Catheter secured externally with O Prolene sutures. The right IJ dermatotomy site was closed with Dermabond. COMPLICATIONS: COMPLICATIONS None immediate FLUOROSCOPY: Radiation Exposure Index (as provided by the fluoroscopic device): 6.7 mGy air Kerma COMPARISON:  None Available. IMPRESSION: 1. Technically successful placement of tunneled right IJ hemodialysis catheter with ultrasound and fluoroscopic guidance. Ready for routine use. ACCESS: Remains approachable for percutaneous intervention as needed. Electronically Signed   By: JONETTA Faes M.D.   On: 06/27/2023 15:05   IR US  Guide Vasc Access Right Result Date: 06/27/2023 CLINICAL DATA:  Renal insufficiency, needs access for hemodialysis EXAM: TUNNELED HEMODIALYSIS CATHETER PLACEMENT WITH ULTRASOUND AND FLUOROSCOPIC GUIDANCE TECHNIQUE: The procedure, risks, benefits, and alternatives were explained to the patient. Questions regarding the procedure were encouraged and answered. The patient understands and consents to the procedure. As antibiotic prophylaxis, cefazolin  2 g was ordered pre-procedure and administered intravenously within one hour of incision.Patency of the right IJ vein was confirmed with ultrasound with image documentation. An appropriate skin site was determined. Region was prepped using maximum barrier technique including cap and mask, sterile gown, sterile gloves, large sterile sheet, and Chlorhexidine  as cutaneous antisepsis. The region was infiltrated locally with 1% lidocaine . Intravenous Fentanyl  50mcg and Versed  1mg  were administered by RN during a total moderate (conscious) sedation time of 12 minutes; the patient's level of consciousness and physiological / cardiorespiratory status were monitored continuously by radiology RN under my direct supervision. Under real-time ultrasound guidance, the right IJ vein was accessed with a 21 gauge micropuncture needle; the needle tip within  the vein was confirmed with ultrasound image documentation. Needle exchanged over the 018 guidewire for transitional dilator, which allowed advancement of a Benson wire into the IVC. Over this, an MPA catheter was advanced. A Palindrome 19 hemodialysis catheter was tunneled from the right anterior chest wall approach to the right IJ dermatotomy site. The MPA catheter was exchanged over an Amplatz wire for serial vascular dilators which allow placement of a peel-away sheath, through which the catheter was advanced under intermittent fluoroscopy, positioned with its tips in the proximal and midright atrium. Spot chest radiograph confirms good catheter position. No pneumothorax. Catheter was flushed and primed per protocol.  Catheter secured externally with O Prolene sutures. The right IJ dermatotomy site was closed with Dermabond. COMPLICATIONS: COMPLICATIONS None immediate FLUOROSCOPY: Radiation Exposure Index (as provided by the fluoroscopic device): 6.7 mGy air Kerma COMPARISON:  None Available. IMPRESSION: 1. Technically successful placement of tunneled right IJ hemodialysis catheter with ultrasound and fluoroscopic guidance. Ready for routine use. ACCESS: Remains approachable for percutaneous intervention as needed. Electronically Signed   By: JONETTA Faes M.D.   On: 06/27/2023 15:05   US  BIOPSY (KIDNEY) Result Date: 06/27/2023 INDICATION: 78 year old female with history of acute kidney injury. EXAM: ULTRASOUND GUIDED RENAL BIOPSY COMPARISON:  None Available. MEDICATIONS: None. ANESTHESIA/SEDATION: Fentanyl  25 mcg IV; Versed  2 mg IV Total Moderate Sedation time: 12 minutes; The patient was continuously monitored during the procedure by the interventional radiology nurse under my direct supervision. COMPLICATIONS: None immediate. PROCEDURE: Informed written consent was obtained from the patient after a discussion of the risks, benefits and alternatives to treatment. The patient understands and consents the  procedure. A timeout was performed prior to the initiation of the procedure. Ultrasound scanning was performed of the bilateral flanks. The inferior pole of the right kidney was selected for biopsy due to location and sonographic window. The procedure was planned. The operative site was prepped and draped in the usual sterile fashion. The overlying soft tissues were anesthetized with 1% lidocaine  with epinephrine. A 17 gauge coaxial introducer needle was advanced into the inferior cortex of the right kidney and 2 core biopsies were obtained under direct ultrasound guidance with an 18 gauge biopsy device. Images were saved for documentation purposes. The biopsy device was removed and Gel-Foam slurry was injected under ultrasound visualization while removing the coaxial needle. Hemostasis was obtained with manual compression. Post procedural scanning was negative for significant post procedural hemorrhage or additional complication. A dressing was placed. The patient tolerated the procedure well without immediate post procedural complication. IMPRESSION: Technically successful ultrasound guided right renal biopsy. Ester Sides, MD [facility] Radiology Electronically Signed   By: Ester Sides M.D.   On: 06/27/2023 13:39   US  RENAL Result Date: 06/17/2023 CLINICAL DATA:  Acute kidney injury EXAM: RENAL / URINARY TRACT ULTRASOUND COMPLETE COMPARISON:  None Available. FINDINGS: Right Kidney: Renal measurements: 10.2 x 5.3 x 5.1 cm = volume: 142.5 mL. Cortex appears slightly echogenic. No hydronephrosis. Echogenic solid mass upper pole right kidney measuring 10 x 9 x 10 mm. Left Kidney: Renal measurements: 9.5 x 5.5 x 4.6 cm = volume: 124.7 mL. Echogenicity within normal limits. No mass or hydronephrosis Bladder: Appears normal for degree of bladder distention. Other: None. IMPRESSION: 1. No hydronephrosis. Slightly echogenic right renal cortex as may be seen with  medical renal disease. 2. 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 Recommend follow-up ultrasound in 6 months. Electronically Signed   By: Luke Bun M.D.   On: 06/17/2023 23:03   DG Chest Port 1 View Result Date: 06/17/2023 CLINICAL DATA:  Fatigue and malaise for 3 days. EXAM: PORTABLE CHEST 1 VIEW COMPARISON:  09/07/2011. FINDINGS: Low lung volume. Bilateral lung fields are clear. Bilateral costophrenic angles are clear. Mildly enlarged cardio-mediastinal silhouette, likely accentuated by low lung volume and AP technique. No acute osseous abnormalities. The soft tissues are within normal limits. IMPRESSION: No active disease. Electronically Signed   By: Ree Molt M.D.   On: 06/17/2023 12:38    Labs:  CBC: Recent Labs    07/01/23 1036 07/02/23 9287 07/03/23 9192 07/04/23 0547  WBC 15.1* 15.5* 13.0* 12.9*  HGB 14.2 13.1 12.8 12.7  HCT 41.1 38.4 38.0 36.7  PLT 120* 139* 133* 129*    COAGS: Recent Labs    06/27/23 0539 07/02/23 1543 07/03/23 0807 07/04/23 0547  INR 1.4* 1.5* 1.5* 1.5*    BMP: Recent Labs    07/01/23 0652 07/02/23 0712 07/03/23 0807 07/04/23 0547  NA 141 145 143 143  K 4.2 4.1 4.3 4.7  CL 110 115* 114* 116*  CO2 21* 21* 23 18*  GLUCOSE 132* 181* 181* 85  BUN 72* 69* 57* 58*  CALCIUM  8.6* 8.8* 8.7* 8.7*  CREATININE 2.45* 2.00* 2.09* 1.94*  GFRNONAA 20* 25* 24* 26*    LIVER FUNCTION TESTS: Recent Labs    07/01/23 0652 07/02/23 0712 07/03/23 0807 07/04/23 0547  BILITOT 3.9* 4.1* 3.5* 3.8*  AST 897* 855* 677* 676*  ALT 506* 520* 451* 456*  ALKPHOS 248* 221* 227* 221*  PROT 5.4* 5.6* 5.4* 5.3*  ALBUMIN 1.8* 1.8* 1.7* 1.7*    TUMOR MARKERS: No results for input(s): AFPTM, CEA, CA199, CHROMGRNA in the last 8760 hours.  Assessment and Plan: 78 y.o. female with elevated LFT and concern for autoimmune hepatitis who was referred to IR for random liver bx.   NPO since [facility] VSS CBC WBC 12.9 w/o  left shit, plt 129 INR 1.5 today  Not on  AC/AP Allergies reviewed   Risks and benefits of random liver bx was discussed with the patient and/or patient's family including, but not limited to bleeding, infection, damage to adjacent structures or low yield requiring additional tests.  All of the questions were answered and there is agreement to proceed.  Consent signed and in chart.  The procedure is tentatively scheduled for today pending IR schedule.  Keep patient NPO for now.   Thank you for this interesting consult.  I greatly enjoyed meeting LUCRESIA SIMIC and look forward to participating in their care.  A copy of this report was sent to the requesting provider on this date.  Electronically Signed: Toya VEAR Cousin, PA-C 07/04/2023, 1:14 PM   I spent a total of 20 Minutes    in face to face in clinical consultation, greater than 50% of which was counseling/coordinating care for random liver bx.   This chart was dictated using voice recognition software.  Despite best efforts to proofread,  errors can occur which can change the documentation meaning.

## 2023-07-04 NOTE — Plan of Care (Signed)
  Problem: Health Behavior/Discharge Planning: Goal: Ability to manage health-related needs will improve Outcome: Progressing   Problem: Clinical Measurements: Goal: Ability to maintain clinical measurements within normal limits will improve Outcome: Progressing Goal: Will remain free from infection Outcome: Progressing   Problem: Activity: Goal: Risk for activity intolerance will decrease Outcome: Progressing   Problem: Nutrition: Goal: Adequate nutrition will be maintained Outcome: Progressing   Problem: Coping: Goal: Level of anxiety will decrease Outcome: Progressing   Problem: Elimination: Goal: Will not experience complications related to bowel motility Outcome: Progressing Goal: Will not experience complications related to urinary retention Outcome: Progressing   Problem: Pain Managment: Goal: General experience of comfort will improve and/or be controlled Outcome: Progressing   Problem: Safety: Goal: Ability to remain free from injury will improve Outcome: Progressing   Problem: Skin Integrity: Goal: Risk for impaired skin integrity will decrease Outcome: Progressing   Problem: Education: Goal: Knowledge of General Education information will improve Description: Including pain rating scale, medication(s)/side effects and non-pharmacologic comfort measures Outcome: Progressing   Problem: Health Behavior/Discharge Planning: Goal: Ability to manage health-related needs will improve Outcome: Progressing   Problem: Clinical Measurements: Goal: Ability to maintain clinical measurements within normal limits will improve Outcome: Progressing Goal: Will remain free from infection Outcome: Progressing Goal: Diagnostic test results will improve Outcome: Progressing Goal: Respiratory complications will improve Outcome: Progressing Goal: Cardiovascular complication will be avoided Outcome: Progressing   Problem: Activity: Goal: Risk for activity intolerance  will decrease Outcome: Progressing   Problem: Nutrition: Goal: Adequate nutrition will be maintained Outcome: Progressing   Problem: Elimination: Goal: Will not experience complications related to bowel motility Outcome: Progressing Goal: Will not experience complications related to urinary retention Outcome: Progressing   Problem: Pain Managment: Goal: General experience of comfort will improve and/or be controlled Outcome: Progressing

## 2023-07-04 NOTE — Procedures (Signed)
Interventional Radiology Procedure:   Indications: Elevated liver enzymes  Procedure: US guided liver biopsy  Findings: 3 cores from right hepatic lobe. Gelfoam injected along biopsy tract.    Complications: None     EBL: Minimal  Plan: Bedrest 3 hours  Denyce Harr R. Lowella Dandy, MD  Pager: [phone]

## 2023-07-04 NOTE — Progress Notes (Signed)
 Physical Therapy Treatment Patient Details Name: Marissa Orozco MRN: [id number] DOB: 10/29/1945 Today's Date: 07/04/2023   History of Present Illness Marissa Orozco is a 78 y.o. female who presented to ED with complaints of low blood pressure and fatigue. Pt found to have an acute kidney injury. Pt underwent Rt kidney biopsy 6/23. PMH: hx of CVA, HTN, HLD, hx of kidney stones    PT Comments  Patient resting in bed and agreeable to mobilize with therapy, reports fatigued and ready to return to bed. Pt taking extra time to initiate and min assist to fully rise and guide RW for pivot chair>bed. Patient completed seated and standing LE exercises for strengthening, endurance, and circulation. Encouraged to completed when sitting up during the day. Mod assist to raise bil LE's for return to supine in bed and repositioned in partial chair position. Will continue to progress pt as able during acute stay.     If plan is discharge home, recommend the following: A lot of help with walking and/or transfers;A lot of help with bathing/dressing/bathroom;Assist for transportation;Help with stairs or ramp for entrance   Can travel by private vehicle        Equipment Recommendations   (defer to next venue)    Recommendations for Other Services Rehab consult     Precautions / Restrictions Precautions Precautions: Fall Recall of Precautions/Restrictions: Intact Restrictions Weight Bearing Restrictions Per Provider Order: No     Mobility  Bed Mobility Overal bed mobility: Needs Assistance Bed Mobility: Sit to Supine       Sit to supine: HOB elevated, Used rails, Mod assist   General bed mobility comments: pt unable to bring LE's onto bed and mod assist required to raise LE's.    Transfers Overall transfer level: Needs assistance Equipment used: Rolling walker (2 wheels) Transfers: Sit to/from Stand, Bed to chair/wheelchair/BSC Sit to Stand: From elevated surface, Min assist   Step  pivot transfers: Min assist       General transfer comment: cues for initiation, pt using bil UE to power up from recliner and min assist to fully rise and guide pivot chair>bed.    Ambulation/Gait               General Gait Details: small side steps along EOB for hip exercises (side stepping for gluteus medius activation) cues to increase step width for great activation. 2x5 reps bil.   Stairs             Wheelchair Mobility     Tilt Bed    Modified Rankin (Stroke Patients Only)       Balance Overall balance assessment: Needs assistance Sitting-balance support: Feet supported, Bilateral upper extremity supported Sitting balance-Leahy Scale: Good     Standing balance support: Bilateral upper extremity supported, Reliant on assistive device for balance Standing balance-Leahy Scale: Poor                              Communication Communication Communication: No apparent difficulties  Cognition Arousal: Alert Behavior During Therapy: Flat affect   PT - Cognitive impairments: No apparent impairments                         Following commands: Intact      Cueing Cueing Techniques: Verbal cues  Exercises General Exercises - Lower Extremity Long Arc Quad: AROM, Both, Seated, Limitations, 10 reps Long Arc Quad Limitations: 2x10 Hip Flexion/Marching:  AROM, Both, Seated, Limitations (2x30 secs) Toe Raises: AROM, Both, 20 reps, Seated, Limitations Toe Raises Limitations: 2x20 Heel Raises: AROM, Both, 20 reps, Seated, Limitations Heel Raises Limitations: 2x20    General Comments        Pertinent Vitals/Pain Pain Assessment Pain Assessment: No/denies pain    Home Living                          Prior Function            PT Goals (current goals can now be found in the care plan section) Acute Rehab PT Goals Patient Stated Goal: to get strong enough to go home PT Goal Formulation: With patient Time For Goal  Achievement: 07/12/23 Potential to Achieve Goals: Good Progress towards PT goals: Progressing toward goals    Frequency    Min 3X/week      PT Plan      Co-evaluation              AM-PAC PT 6 Clicks Mobility   Outcome Measure  Help needed turning from your back to your side while in a flat bed without using bedrails?: A Lot Help needed moving from lying on your back to sitting on the side of a flat bed without using bedrails?: A Lot Help needed moving to and from a bed to a chair (including a wheelchair)?: A Little Help needed standing up from a chair using your arms (e.g., wheelchair or bedside chair)?: A Little Help needed to walk in hospital room?: A Lot Help needed climbing 3-5 steps with a railing? : Total 6 Click Score: 13    End of Session Equipment Utilized During Treatment: Gait belt Activity Tolerance: Patient limited by fatigue;Patient tolerated treatment well Patient left: in bed;with call bell/phone within reach;with bed alarm set Nurse Communication: Mobility status PT Visit Diagnosis: Unsteadiness on feet (R26.81);Muscle weakness (generalized) (M62.81);Difficulty in walking, not elsewhere classified (R26.2)     Time: 8679-8657 PT Time Calculation (min) (ACUTE ONLY): 22 min  Charges:    $Therapeutic Exercise: 8-22 mins                       Vernell DONEEN KLEIN, DPT Acute Rehabilitation Services Office [phone]  07/04/23 2:22 PM

## 2023-07-04 NOTE — Plan of Care (Signed)
   Problem: Health Behavior/Discharge Planning: Goal: Ability to manage health-related needs will improve Outcome: Progressing

## 2023-07-04 NOTE — TOC Progression Note (Signed)
 Transition of Care [facility]) - Progression Note    Patient Details  Name: Marissa Orozco MRN: [id number] Date of Birth: 1945/03/24  [facility]) CM/SW Contact  Tom-Johnson, Harvest Muskrat, RN Phone Number: 07/04/2023, 1:04 PM  Clinical Narrative:     Patient underwent removal of [facility] Catheter today 07/04/23. Patient with abnormal Liver Enzymes, plan for Liver Biopsy, GI following. CIR following for possible admit at discharge.  Patient not Medically ready for discharge.  CM will continue to follow as patient progresses with care towards discharge.              Expected Discharge Plan and Services                                               Social Determinants of Health (SDOH) Interventions SDOH Screenings   Food Insecurity: No Food Insecurity (06/17/2023)  Housing: Low Risk  (06/17/2023)  Transportation Needs: No Transportation Needs (06/17/2023)  Utilities: Not At Risk (06/17/2023)  Alcohol Screen: Low Risk  (02/12/2023)  Depression (PHQ2-9): Low Risk  (06/02/2023)  Financial Resource Strain: Low Risk  (02/12/2023)  Physical Activity: Sufficiently Active (02/12/2023)  Social Connections: Moderately Isolated (06/17/2023)  Stress: No Stress Concern Present (02/12/2023)  Tobacco Use: Low Risk  (07/04/2023)  Health Literacy: Adequate Health Literacy (09/15/2022)    Readmission Risk Interventions    06/24/2023    1:10 PM  Readmission Risk Prevention Plan  Transportation Screening Complete  PCP or Specialist Appt within 5-7 Days Complete  Home Care Screening Complete  Medication Review (RN CM) Referral to Pharmacy

## 2023-07-04 NOTE — Progress Notes (Signed)
 Progress Note   Subjective  Stable overnight. LFTs about the same today.    Objective   Vital signs in last 24 hours: Temp:  [98 F (36.7 C)-98.3 F (36.8 C)] 98 F (36.7 C) (06/30 0821) Pulse Rate:  [63-75] 75 (06/30 0821) Resp:  [14-19] 19 (06/30 0821) BP: (101-120)/(58-62) 116/62 (06/30 0821) SpO2:  [98 %-100 %] 98 % (06/30 0821) Weight:  [78.9 kg] 78.9 kg (06/30 0702) Last BM Date : 07/03/23 General:    AA female in NAD Neurologic:  Alert and oriented,  grossly normal neurologically. Psych:  Cooperative. Normal mood and affect.  Intake/Output from previous day: 06/29 0701 - 06/30 0700 In: 360 [P.O.:360] Out: 510 [Urine:510] Intake/Output this shift: Total I/O In: 0  Out: 400 [Urine:400]  Lab Results: Recent Labs    07/02/23 0712 07/03/23 0807 07/04/23 0547  WBC 15.5* 13.0* 12.9*  HGB 13.1 12.8 12.7  HCT 38.4 38.0 36.7  PLT 139* 133* 129*   BMET Recent Labs    07/02/23 0712 07/03/23 0807 07/04/23 0547  NA 145 143 143  K 4.1 4.3 4.7  CL 115* 114* 116*  CO2 21* 23 18*  GLUCOSE 181* 181* 85  BUN 69* 57* 58*  CREATININE 2.00* 2.09* 1.94*  CALCIUM  8.8* 8.7* 8.7*   LFT Recent Labs    07/04/23 0547  PROT 5.3*  ALBUMIN 1.7*  AST 676*  ALT 456*  ALKPHOS 221*  BILITOT 3.8*   PT/INR Recent Labs    07/03/23 0807 07/04/23 0547  LABPROT 18.7* 18.9*  INR 1.5* 1.5*    Studies/Results: IR Removal Tun Cv Cath W/O FL Result Date: 07/04/2023 INDICATION: 78 year old female with history of AKI requiring dialysis, status post tunneled dialysis catheter placement 06/27/2023 presents with recovery of renal function. Request for tunneled dialysis catheter removal. EXAM: REMOVAL OF TUNNELED HEMODIALYSIS CATHETER MEDICATIONS: None COMPLICATIONS: None immediate. PROCEDURE: Informed written consent was obtained from the patient following an explanation of the procedure, risks, benefits and alternatives to treatment. A time out was performed prior to the  initiation of the procedure. Sterile technique was utilized including mask, sterile gloves, sterile drape, and hand hygiene. ChloraPrep was used to prep the patient's right neck, chest and existing catheter. 1% lidocaine  was injected around the catheter and the subcutaneous tunnel. The catheter was removed intact by applying gentle manual retraction. Hemostasis was obtained with manual compression. A dressing was placed. The patient tolerated the procedure well without immediate post procedural complication. IMPRESSION: Successful removal of tunneled dialysis catheter. Performed by: Aimee Han, PA-C Electronically Signed   By: Marcey Moan M.D.   On: 07/04/2023 09:00       Assessment / Plan:    78 y/o female here with the following:   Elevated liver enzymes - mixed pattern AKI s/p renal biopsy Dilated bile duct on MRCP Pancreatic cyst   See prior notes for full details. LFTs about the same today - ddx includes shock liver, while autoimmune hepatitis possible given her lab results. EBV IgM also positive but unclear how reliable that is.   Given persistent elevation in enzymes, I think liver biopsy is next step here, especially heading into long holiday weekend if this does not improve. I have discussed risks / benefits with her and family, they want to proceed. IR consulted for liver biopsy. She is NPO in case there is room to do it today.   Additional labs sent, and some still pending.   I otherwise had reviewed her imaging  with my advanced endoscopy colleagues, and they did not think she has biliary obstruction causing this pattern of enzyme abnormality.  She has fusiform dilation of the CBD with some left-sided intrahepatic dilation but no obvious obstructive pathology.  She will need an EUS to help evaluate this but they do not recommend ERCP right now. She also has pancreatic cyst noted incidentally on imaging.  This will require EUS as well to further evaluate, can coordinate as  outpatient.    Will follow, call with questions.   Marcey Naval, MD [facility]

## 2023-07-04 NOTE — Progress Notes (Signed)
 Occupational Therapy Treatment Patient Details Name: Marissa Orozco MRN: [id number] DOB: 1945-07-08 Today's Date: 07/04/2023   History of present illness Marissa Orozco is a 78 y.o. female who presented to ED with complaints of low blood pressure and fatigue. Pt found to have an acute kidney injury. Pt underwent Rt kidney biopsy 6/23. PMH: hx of CVA, HTN, HLD, hx of kidney stones   OT comments  Pt making progress with functional goals although limited by fatigue. Pt able to sit EOB min A, stand to RW for steps to chair where pt participated in simulated bathing and, grooming/hygiene and UB dressing task. OT will continue to follow acutely to maximize level of function and safety      If plan is discharge home, recommend the following:  A lot of help with bathing/dressing/bathroom;Assist for transportation;Help with stairs or ramp for entrance;Assistance with cooking/housework;A lot of help with walking and/or transfers   Equipment Recommendations  BSC/3in1    Recommendations for Other Services      Precautions / Restrictions Precautions Precautions: Fall Recall of Precautions/Restrictions: Intact Restrictions Weight Bearing Restrictions Per Provider Order: No       Mobility Bed Mobility Overal bed mobility: Needs Assistance Bed Mobility: Supine to Sit     Supine to sit: Min assist     General bed mobility comments: HOB elevated, increased time, assist with LEs and to elevate trunk    Transfers Overall transfer level: Needs assistance Equipment used: Rolling walker (2 wheels) Transfers: Sit to/from Stand, Bed to chair/wheelchair/BSC Sit to Stand: From elevated surface, Min assist     Step pivot transfers: Min assist           Balance Overall balance assessment: Needs assistance Sitting-balance support: Feet supported, Bilateral upper extremity supported Sitting balance-Leahy Scale: Fair     Standing balance support: Bilateral upper extremity supported,  During functional activity, Reliant on assistive device for balance Standing balance-Leahy Scale: Poor                             ADL either performed or assessed with clinical judgement   ADL Overall ADL's : Needs assistance/impaired     Grooming: Wash/dry hands;Wash/dry face;Contact guard assist;Sitting   Upper Body Bathing: Contact guard assist;Sitting   Lower Body Bathing: Maximal assistance;Sitting/lateral leans   Upper Body Dressing : Contact guard assist;Sitting       Toilet Transfer: Minimal assistance;Stand-pivot;Rolling walker (2 wheels);Cueing for safety   Toileting- Clothing Manipulation and Hygiene: Moderate assistance;Sit to/from stand       Functional mobility during ADLs: Minimal assistance;Rolling walker (2 wheels);Cueing for safety      Extremity/Trunk Assessment Upper Extremity Assessment Upper Extremity Assessment: Generalized weakness   Lower Extremity Assessment Lower Extremity Assessment: Defer to PT evaluation   Cervical / Trunk Assessment Cervical / Trunk Assessment: Normal    Vision Baseline Vision/History: 0 No visual deficits Ability to See in Adequate Light: 0 Adequate Patient Visual Report: No change from baseline     Perception     Praxis     Communication Communication Communication: No apparent difficulties   Cognition Arousal: Alert Behavior During Therapy: Flat affect Cognition: No apparent impairments                               Following commands: Intact        Cueing   Cueing Techniques: Verbal cues  Exercises  Shoulder Instructions       General Comments      Pertinent Vitals/ Pain       Pain Assessment Pain Assessment: No/denies pain  Home Living                                          Prior Functioning/Environment              Frequency  Min 2X/week        Progress Toward Goals  OT Goals(current goals can now be found in the care plan  section)  Progress towards OT goals: Progressing toward goals     Plan      Co-evaluation                 AM-PAC OT 6 Clicks Daily Activity     Outcome Measure   Help from another person eating meals?: None Help from another person taking care of personal grooming?: A Little Help from another person toileting, which includes using toliet, bedpan, or urinal?: A Lot Help from another person bathing (including washing, rinsing, drying)?: A Lot Help from another person to put on and taking off regular upper body clothing?: A Little Help from another person to put on and taking off regular lower body clothing?: Total 6 Click Score: 15    End of Session Equipment Utilized During Treatment: Gait belt;Rolling walker (2 wheels)  OT Visit Diagnosis: Unsteadiness on feet (R26.81);Other abnormalities of gait and mobility (R26.89);Muscle weakness (generalized) (M62.81)   Activity Tolerance Patient limited by fatigue   Patient Left in chair;with call bell/phone within Orozco;with chair alarm set   Nurse Communication Mobility status        Time: 8867-8851 OT Time Calculation (min): 16 min  Charges: OT General Charges $OT Visit: 1 Visit OT Treatments $Self Care/Home Management : 8-22 mins    Jacques Karna Loose 07/04/2023, 1:43 PM

## 2023-07-04 NOTE — Progress Notes (Signed)
 PROGRESS NOTE    KARLIAH KOWALCHUK  FMW:[id number] DOB: 1945/10/04 DOA: 06/17/2023 PCP: Jarold Medici, MD   Brief Narrative:  Marissa Orozco is an 78 y.o. female past medical history of CVA, essential hypertension hyperlipidemia history of kidney stones coming to the ED complaining of fatigue was found to have low blood pressure, she relates significant poor intake of less than the days, called her PCP to told her to come to the ED.  Was found to be in acute kidney injury and currently undergoing further renal workup.  Had to have a dialysis catheter placed and dialyzed on 06/25/2003.  Concern for glomerulonephritis however per Nephrology, renal biopsy done and revealed chronic changes, AIN and some TMA but no immune complex mediated or crescentic glomerular nephritis noted.  Appears significantly fatigued and deconditioned so PT/OT are recommending CIR.  Incidentally also noted to have abnormal LFTs so we will do further workup for this as well and obtain RUQ U/S which showed Fatty Liver Disease and Acute Hepatitis Panel Negative.  Pursuing further hepatic workup after discussion with GI will be obtaining liver biopsy since enzymes are stagnant; MRCP done and several labs done concerning for autoimmune hepatitis so GI adding additional labs.   Patient underwent liver biopsy on the afternoon of 07/04/23 she had 3 cores in the right hepatic lobe.   Assessment and Plan:  ATN/ Normal Anion Gap Metabolic Acidosis/ Uremic: Hyperphosphatemia -BUN/Cr Trend: Recent Labs  Lab 06/28/23 0522 06/29/23 0451 06/30/23 0543 07/01/23 9347 07/02/23 0712 07/03/23 0807 07/04/23 0547  BUN 61* 73* 76* 72* 69* 57* 58*  CREATININE 3.25* 3.30* 2.66* 2.45* 2.00* 2.09* 1.94*  -U/A showed white blood cells and protein.  C3 and C4 complements are low, ANA positive as well as p-ANCA 1:160; No M spike on SPEP. -Phos Level went from 5.0 -> 5.2 -> 5.6 -> 4.3 -> 4.5 -> 4.7 -Has a slight metabolic acidosis with a CO2 of  21, anion gap of 9, chloride level 150 -Avoid Nephrotoxic Medications, Contrast Dyes, Hypotension and Dehydration to Ensure Adequate Renal Perfusion and will need to Renally Adjust Meds -Status post renal biopsy on 06/27/2023 by IR as there is concern for Glomerulonephritis.  No biopsy revealed chronic changes, AIN, and some TMA but no immune complex mediated and crescentic glomerulonephritis -Status post dialysis catheter placement and first dialysis in 06/25/2023. Follow ongoing renal recovery. -Continue to Monitor and Trend Renal Function carefully and repeat CMP in the AM  -Further Management per Nephrology; Renal Fxn is improving  -PT/OT consulted and recommending CIR but will hold discharging her to CIR on 6/29 given further evaluation by GI.  Nephrology recommends to discontinue TDC now  Proteinuria: Urine protein/creatinine ratio was 2530 and currently getting workup as above.  She is off of ARB given her AKI and nephrology following closely  Microscopic Hematuria: Likely in the setting of UTI.  Antibiotics have now been discontinued.  She has had renal workup as above   Essential HTN (Hypertension): Holding ARB in the setting of ATN. Blood pressure continues to be stable and now on the softer side. IV Lasix  40 mg BID changing po Furosemide  40 mg po BID. CTM BP per Protcol. Last BP reading was 108/59   Abnormal LFTs / Hyperbilirubinemia: AST is now 927 -> 897 -> 855 -> 677 -> 676 and ALT is 448 -> 506 -> 520 -> 451 -> 456. T Bili is now 4.0 (1.9 Indirect, 2.1 Direct) -> 3.9 -> 4.1 -> 3.5 on last check. D/C'd  Acetaminophen  given Abnormal LFTs. Checked RUQ U/S and showed Fatty Liver Disease. Acute Hepatitis Panel Negative. CTM and Trend Hepatic Fxn Panel and repeat CMP in the AM. D/w GI informally and they are recommending checking an IgG level (2038), anti-smooth muscle antibody +, CMV IgM <30.0 and IgG >10.00, Epstein-Barr IgM 56.0, IgG >600, and HSV non-reactive as well as getting a noncontrast  MRCP. MRCP done and showed some fusiform dilatation of the common bile duct to 1 cm some intrahepatic dilatation the left side as well as incidentally noting a 5.2 cm x 4.6 cm pancreatic cyst which will need further evaluation by EUS and GI feels that this can be done in an outpatient setting.  Nephrology is checking iron studies to evaluate for hemochromatosis.  GI formally consulted and they are considering a liver biopsy for further evaluation and discussing with advanced endoscopy colleagues to see what they think about pursuing an ERCP and they do not think that she has a biliary obstruction causing his part of enzyme abnormality and have recommended against ERCP at this time. GI recommends continuing trending LFTs and since they did not trend or get any better we will get a liver biopsy today and IR was consulted and biopsy was done this afternoon..  Will continue to monitor and GI is also checking a additional day including antimitochondrial antibody, IgG4 and inflammatory markers  Pre-Diabetes: Recent hemoglobin A1c was 6.2 on 06/02/2023.  CTM CBGs per protocol.  Blood Sugars ranging from 97-181 on daily BMP/CMP's.   E. coli UTI: Completed a 7 day course of IV Ceftriaxone . WBC went from 14.4 -> 13.9 -> 15.3 -> 15.1 -> 15.5 -> 13.0 -> 12.9. ? Reactive Leukocytosis. CTM and Trend and repeat CBC in the AM.    Hyperlipidemia: Discontinue Ezetimibe  10 mg po Daily. Currently holding Atorvastatin  80 mg po Daily given Abnormal LFTs   History of CVA: Holding Aspirin  81 mg po daily and Atorvastatin  80 mg po Daily.  Glaucoma: C/w Dorzolamide -Timolol  2-0.5% ophthalmic solution 1 drop both Eyes BID and Latanoprost  1 drop Both Eyes qHS   Hypokalemia: K+ is now 4.7. Hold and D/C po KCL 20 mEQ BID for now. CTM and Trend and Replete as Necessary. Goal K+ > 4.0 and Mag > 2.0. Repeat CMP and Mag Level in the AM  Thrombocytopenia: Platelet Count went from 113 -> 107 -> 116 -> 108 -> 130 -> 120 -> 139 -> 133 -> 129.  CTM and for S/Sx of Bleeding; No overt bleeding noted. Repeat CBC in the AM   Hypoalbuminemia: Patient's Albumin Level went from 2.1 -> 1.9 -> 1.7 -> 1.8 x3 -> 1.7 x2. CTM and Trend and repeat CMP in the AM  Class I Obesity: Complicates overall prognosis and care. Estimated body mass index is 30.33 kg/m as calculated from the following:   Height as of this encounter: 5' 3.5 (1.613 m).   Weight as of this encounter: 78.9 kg. Weight Loss and Dietary Counseling given   DVT prophylaxis: Place and maintain sequential compression device Start: 06/20/23 1531    Code Status: Full Code Family Communication: D/w Husband @ bedside  Disposition Plan:  Level of care: Telemetry Medical Status is: Inpatient Remains inpatient appropriate because: Needs further clinical improvement and clearance by the specialists   Consultants:  Gastroenterology Nephrology Interventional radiology CIR  Procedures:  As delineated as above  Antimicrobials:  Anti-infectives (From admission, onward)    Start     Dose/Rate Route Frequency Ordered Stop   06/24/23 1430  ceFAZolin  (ANCEF ) IVPB 2g/100 mL premix        2 g 200 mL/hr over 30 Minutes Intravenous  Once 06/24/23 1333 06/25/23 0743   06/24/23 1415  ceFAZolin  (ANCEF ) IVPB 2g/100 mL premix  Status:  Discontinued        2 g 200 mL/hr over 30 Minutes Intravenous  Once 06/24/23 1327 06/24/23 1333   06/19/23 2000  cefTRIAXone  (ROCEPHIN ) 1 g in sodium chloride  0.9 % 100 mL IVPB        1 g 200 mL/hr over 30 Minutes Intravenous Every 24 hours 06/19/23 1928 06/26/23 0748       Subjective: Seen and examined at bedside and she is sitting up in the bed with no concerns but still feels very fatigued.  Had a bowel movement yesterday.  No other concerns or complaints at this time but states that she is a bit hungry as she has been made n.p.o. for potential liver biopsy this afternoon.  Objective: Vitals:   07/04/23 1525 07/04/23 1530 07/04/23 1548 07/04/23 1549   BP: (!) 108/56 (!) 113/55 (!) 108/59 (!) 108/59  Pulse: 70 69 66   Resp: (!) 21 18 19 16   Temp:   98.4 F (36.9 C) 98.4 F (36.9 C)  TempSrc:    Oral  SpO2: 100% 99% 100% 100%  Weight:      Height:        Intake/Output Summary (Last 24 hours) at 07/04/2023 1821 Last data filed at 07/04/2023 1756 Gross per 24 hour  Intake 420 ml  Output 600 ml  Net -180 ml   Filed Weights   06/27/23 0701 07/02/23 2100 07/04/23 0702  Weight: 79.8 kg 78.9 kg 78.9 kg   Examination: Physical Exam:  Constitutional: WN/WD obese chronically ill-appearing African-American female in no acute distress appears fatigued though Respiratory: Diminished to auscultation bilaterally, no wheezing, rales, rhonchi or crackles. Normal respiratory effort and patient is not tachypenic. No accessory muscle use.  Cardiovascular: RRR, no murmurs / rubs / gallops. S1 and S2 auscultated.  Has some lower extremity edema Abdomen: Soft, non-tender, distended secondary to body habitus.. Bowel sounds positive.  GU: Deferred. Musculoskeletal: No clubbing / cyanosis of digits/nails. No joint deformity upper and lower extremities. Skin: No rashes, lesions, ulcers or limited skin evaluation. No induration; Warm and dry.  Neurologic: CN 2-12 grossly intact with no focal deficits. Romberg sign and cerebellar reflexes not assessed.  Psychiatric: Normal judgment and insight. Alert and oriented x 3. Normal mood and appropriate affect.   Data Reviewed: I have personally reviewed following labs and imaging studies  CBC: Recent Labs  Lab 06/30/23 0543 07/01/23 1036 07/02/23 0712 07/03/23 0807 07/04/23 0547  WBC 15.3* 15.1* 15.5* 13.0* 12.9*  NEUTROABS 12.9* 11.4* 11.2* 10.3* 8.2*  HGB 13.6 14.2 13.1 12.8 12.7  HCT 39.0 41.1 38.4 38.0 36.7  MCV 86.7 88.4 89.9 90.9 90.8  PLT 130* 120* 139* 133* 129*   Basic Metabolic Panel: Recent Labs  Lab 06/29/23 0940 06/30/23 0543 07/01/23 0652 07/02/23 0712 07/03/23 0807  07/04/23 0547  NA  --  140 141 145 143 143  K  --  4.2 4.2 4.1 4.3 4.7  CL  --  109 110 115* 114* 116*  CO2  --  23 21* 21* 23 18*  GLUCOSE  --  102* 132* 181* 181* 85  BUN  --  76* 72* 69* 57* 58*  CREATININE  --  2.66* 2.45* 2.00* 2.09* 1.94*  CALCIUM   --  8.6* 8.6* 8.8* 8.7*  8.7*  MG 1.9  --  1.9 1.8 1.7 1.8  PHOS  --  5.6* 4.2 4.3 4.5 4.7*   GFR: Estimated Creatinine Clearance: 23.6 mL/min (A) (by C-G formula based on SCr of 1.94 mg/dL (H)). Liver Function Tests: Recent Labs  Lab 06/29/23 0940 06/30/23 0543 07/01/23 9347 07/02/23 0712 07/03/23 0807 07/04/23 0547  AST 927*  --  897* 855* 677* 676*  ALT 448*  --  506* 520* 451* 456*  ALKPHOS 262*  --  248* 221* 227* 221*  BILITOT 4.0*  --  3.9* 4.1* 3.5* 3.8*  PROT 5.5*  --  5.4* 5.6* 5.4* 5.3*  ALBUMIN 1.8* 1.8* 1.8* 1.8* 1.7* 1.7*   No results for input(s): LIPASE, AMYLASE in the last 168 hours. No results for input(s): AMMONIA in the last 168 hours. Coagulation Profile: Recent Labs  Lab 07/02/23 1543 07/03/23 0807 07/04/23 0547  INR 1.5* 1.5* 1.5*   Cardiac Enzymes: No results for input(s): CKTOTAL, CKMB, CKMBINDEX, TROPONINI in the last 168 hours. BNP (last 3 results) No results for input(s): PROBNP in the last 8760 hours. HbA1C: No results for input(s): HGBA1C in the last 72 hours. CBG: No results for input(s): GLUCAP in the last 168 hours. Lipid Profile: No results for input(s): CHOL, HDL, LDLCALC, TRIG, CHOLHDL, LDLDIRECT in the last 72 hours. Thyroid  Function Tests: No results for input(s): TSH, T4TOTAL, FREET4, T3FREE, THYROIDAB in the last 72 hours. Anemia Panel: Recent Labs    07/03/23 0807  FERRITIN 309*  TIBC 235*  IRON 73   Sepsis Labs: No results for input(s): PROCALCITON, LATICACIDVEN in the last 168 hours.  Recent Results (from the past 240 hours)  MRSA Next Gen by PCR, Nasal     Status: None   Collection Time: 06/29/23  8:21 AM    Specimen: Nasal Mucosa; Nasal Swab  Result Value Ref Range Status   MRSA by PCR Next Gen NOT DETECTED NOT DETECTED Final    Comment: (NOTE) The GeneXpert MRSA Assay (FDA approved for NASAL specimens only), is one component of a comprehensive MRSA colonization surveillance program. It is not intended to diagnose MRSA infection nor to guide or monitor treatment for MRSA infections. Test performance is not FDA approved in patients less than 57 years old. Performed at [facility], [address], [place], [place] [address]     Radiology Studies: US  BIOPSY (LIVER) Result Date: 07/04/2023 INDICATION: Elevated LFTs.  Request for random liver biopsy. EXAM: ULTRASOUND-GUIDED LIVER BIOPSY MEDICATIONS: Moderate sedation ANESTHESIA/SEDATION: Moderate (conscious) sedation was employed during this procedure. A total of Versed  1 mg and Fentanyl  75 mcg was administered intravenously by the radiology nurse. Total intra-service moderate Sedation Time: 20 minutes. The patient's level of consciousness and vital signs were monitored continuously by radiology nursing throughout the procedure under my direct supervision. FLUOROSCOPY TIME:  None COMPLICATIONS: None immediate. PROCEDURE: Informed written consent was obtained from the patient after a thorough discussion of the procedural risks, benefits and alternatives. All questions were addressed. A timeout was performed prior to the initiation of the procedure. Abdomen was evaluated with ultrasound. The right hepatic lobe was identified. The right side of the abdomen was prepped with chlorhexidine  and sterile field was created. Skin was anesthetized using 1% lidocaine . Small incision was made. Using ultrasound guidance, 17 gauge coaxial needle was directed into the right hepatic lobe. Total of 3 core biopsies were performed with an 18 gauge core device. Three adequate specimens obtained and placed in formalin. Gel-Foam slurry was injected as  the 17 gauge needle was  removed. Bandage placed over the puncture site. FINDINGS: Core biopsies obtained from the right hepatic lobe. No immediate bleeding or hematoma formation. IMPRESSION: Successful ultrasound-guided core biopsies from the right hepatic lobe. Electronically Signed   By: Juliene Balder M.D.   On: 07/04/2023 16:25   IR Removal Tun Cv Cath W/O FL Result Date: 07/04/2023 INDICATION: 78 year old female with history of AKI requiring dialysis, status post tunneled dialysis catheter placement 06/27/2023 presents with recovery of renal function. Request for tunneled dialysis catheter removal. EXAM: REMOVAL OF TUNNELED HEMODIALYSIS CATHETER MEDICATIONS: None COMPLICATIONS: None immediate. PROCEDURE: Informed written consent was obtained from the patient following an explanation of the procedure, risks, benefits and alternatives to treatment. A time out was performed prior to the initiation of the procedure. Sterile technique was utilized including mask, sterile gloves, sterile drape, and hand hygiene. ChloraPrep was used to prep the patient's right neck, chest and existing catheter. 1% lidocaine  was injected around the catheter and the subcutaneous tunnel. The catheter was removed intact by applying gentle manual retraction. Hemostasis was obtained with manual compression. A dressing was placed. The patient tolerated the procedure well without immediate post procedural complication. IMPRESSION: Successful removal of tunneled dialysis catheter. Performed by: Aimee Han, PA-C Electronically Signed   By: Marcey Moan M.D.   On: 07/04/2023 09:00   Scheduled Meds:  Chlorhexidine  Gluconate Cloth  6 each Topical Q0600   Chlorhexidine  Gluconate Cloth  6 each Topical Q0600   docusate sodium   100 mg Oral BID   dorzolamide -timolol   1 drop Both Eyes BID   furosemide   40 mg Oral BID   latanoprost   1 drop Both Eyes QHS   Continuous Infusions:   LOS: 16 days   Alejandro Marker, DO [facility] Available via Epic secure chat  7am-7pm After these hours, please refer to coverage provider listed on [email or web address] 07/04/2023, 6:21 PM

## 2023-07-05 DIAGNOSIS — R933 Abnormal findings on diagnostic imaging of other parts of digestive tract: Secondary | ICD-10-CM

## 2023-07-05 DIAGNOSIS — N179 Acute kidney failure, unspecified: Secondary | ICD-10-CM | POA: Diagnosis not present

## 2023-07-05 DIAGNOSIS — K862 Cyst of pancreas: Secondary | ICD-10-CM | POA: Diagnosis not present

## 2023-07-05 DIAGNOSIS — R748 Abnormal levels of other serum enzymes: Secondary | ICD-10-CM | POA: Diagnosis not present

## 2023-07-05 LAB — COMPREHENSIVE METABOLIC PANEL WITH GFR
ALT: 446 U/L — ABNORMAL HIGH (ref 0–44)
AST: 689 U/L — ABNORMAL HIGH (ref 15–41)
Albumin: 1.6 g/dL — ABNORMAL LOW (ref 3.5–5.0)
Alkaline Phosphatase: 216 U/L — ABNORMAL HIGH (ref 38–126)
Anion gap: 4 — ABNORMAL LOW (ref 5–15)
BUN: 54 mg/dL — ABNORMAL HIGH (ref 8–23)
CO2: 19 mmol/L — ABNORMAL LOW (ref 22–32)
Calcium: 8.1 mg/dL — ABNORMAL LOW (ref 8.9–10.3)
Chloride: 117 mmol/L — ABNORMAL HIGH (ref 98–111)
Creatinine, Ser: 1.63 mg/dL — ABNORMAL HIGH (ref 0.44–1.00)
GFR, Estimated: 32 mL/min — ABNORMAL LOW (ref >60)
Glucose, Bld: 89 mg/dL (ref 70–99)
Potassium: 4 mmol/L (ref 3.5–5.1)
Sodium: 140 mmol/L (ref 135–145)
Total Bilirubin: 4.3 mg/dL — ABNORMAL HIGH (ref 0.0–1.2)
Total Protein: 5.3 g/dL — ABNORMAL LOW (ref 6.5–8.1)

## 2023-07-05 LAB — CBC WITH DIFFERENTIAL/PLATELET
Abs Immature Granulocytes: 0.07 10*3/uL (ref 0.00–0.07)
Basophils Absolute: 0.1 10*3/uL (ref 0.0–0.1)
Basophils Relative: 1 %
Eosinophils Absolute: 0.2 10*3/uL (ref 0.0–0.5)
Eosinophils Relative: 2 %
HCT: 36.5 % (ref 36.0–46.0)
Hemoglobin: 12.4 g/dL (ref 12.0–15.0)
Immature Granulocytes: 1 %
Lymphocytes Relative: 20 %
Lymphs Abs: 2 10*3/uL (ref 0.7–4.0)
MCH: 31.1 pg (ref 26.0–34.0)
MCHC: 34 g/dL (ref 30.0–36.0)
MCV: 91.5 fL (ref 80.0–100.0)
Monocytes Absolute: 1 10*3/uL (ref 0.1–1.0)
Monocytes Relative: 10 %
Neutro Abs: 6.6 10*3/uL (ref 1.7–7.7)
Neutrophils Relative %: 66 %
Platelets: 155 10*3/uL (ref 150–400)
RBC: 3.99 MIL/uL (ref 3.87–5.11)
RDW: 23 % — ABNORMAL HIGH (ref 11.5–15.5)
WBC: 10 10*3/uL (ref 4.0–10.5)
nRBC: 0 % (ref 0.0–0.2)

## 2023-07-05 LAB — EPSTEIN BARR VRS(EBV DNA BY PCR): EBV DNA QN by PCR: NEGATIVE [IU]/mL

## 2023-07-05 LAB — PHOSPHORUS: Phosphorus: 4.8 mg/dL — ABNORMAL HIGH (ref 2.5–4.6)

## 2023-07-05 LAB — MAGNESIUM: Magnesium: 1.9 mg/dL (ref 1.7–2.4)

## 2023-07-05 NOTE — Progress Notes (Signed)
 Mobility Specialist Progress Note:    07/05/23 0955  Mobility  Activity Transferred from bed to chair  Level of Assistance Minimal assist, patient does 75% or more  Assistive Device Front wheel walker  Mobility Referral Yes  Mobility visit 1 Mobility  Mobility Specialist Start Time (ACUTE ONLY) 0955  Mobility Specialist Stop Time (ACUTE ONLY) 1005  Mobility Specialist Time Calculation (min) (ACUTE ONLY) 10 min   Pt tired but pleasant and agreeable to session. Needed assistance getting to the EOB and took a moment to gather herself once there. Pt able to stand w/ assistance, able to side step, and pivot to get to recliner. Pt left in recliner w/ no c/o and all needs met.   Therisa Rana Mobility Specialist Please contact via SecureChat or  Rehab office at [phone]

## 2023-07-05 NOTE — Progress Notes (Signed)
 Progress Note   Subjective  Feels the same, post liver biopsy, denies pain.    Objective   Vital signs in last 24 hours: Temp:  [97.8 F (36.6 C)-98.4 F (36.9 C)] 98.1 F (36.7 C) (07/01 0715) Pulse Rate:  [62-71] 65 (07/01 0715) Resp:  [14-21] 20 (07/01 0523) BP: (105-141)/(54-69) 107/55 (07/01 0715) SpO2:  [99 %-100 %] 99 % (07/01 0715) Weight:  [80.9 kg] 80.9 kg (06/30 2100) Last BM Date : 07/03/23 General:    AA female in NAD Neurologic:  Alert and oriented,  grossly normal neurologically. Psych:  Cooperative. Normal mood and affect.  Intake/Output from previous day: 06/30 0701 - 07/01 0700 In: 600 [P.O.:600] Out: 1000 [Urine:1000] Intake/Output this shift: Total I/O In: 240 [P.O.:240] Out: -   Lab Results: Recent Labs    07/03/23 0807 07/04/23 0547 07/05/23 0518  WBC 13.0* 12.9* 10.0  HGB 12.8 12.7 12.4  HCT 38.0 36.7 36.5  PLT 133* 129* 155   BMET Recent Labs    07/03/23 0807 07/04/23 0547 07/05/23 0518  NA 143 143 140  K 4.3 4.7 4.0  CL 114* 116* 117*  CO2 23 18* 19*  GLUCOSE 181* 85 89  BUN 57* 58* 54*  CREATININE 2.09* 1.94* 1.63*  CALCIUM  8.7* 8.7* 8.1*   LFT Recent Labs    07/05/23 0518  PROT 5.3*  ALBUMIN 1.6*  AST 689*  ALT 446*  ALKPHOS 216*  BILITOT 4.3*   PT/INR Recent Labs    07/03/23 0807 07/04/23 0547  LABPROT 18.7* 18.9*  INR 1.5* 1.5*    Studies/Results: US  BIOPSY (LIVER) Result Date: 07/04/2023 INDICATION: Elevated LFTs.  Request for random liver biopsy. EXAM: ULTRASOUND-GUIDED LIVER BIOPSY MEDICATIONS: Moderate sedation ANESTHESIA/SEDATION: Moderate (conscious) sedation was employed during this procedure. A total of Versed  1 mg and Fentanyl  75 mcg was administered intravenously by the radiology nurse. Total intra-service moderate Sedation Time: 20 minutes. The patient's level of consciousness and vital signs were monitored continuously by radiology nursing throughout the procedure under my direct  supervision. FLUOROSCOPY TIME:  None COMPLICATIONS: None immediate. PROCEDURE: Informed written consent was obtained from the patient after a thorough discussion of the procedural risks, benefits and alternatives. All questions were addressed. A timeout was performed prior to the initiation of the procedure. Abdomen was evaluated with ultrasound. The right hepatic lobe was identified. The right side of the abdomen was prepped with chlorhexidine  and sterile field was created. Skin was anesthetized using 1% lidocaine . Small incision was made. Using ultrasound guidance, 17 gauge coaxial needle was directed into the right hepatic lobe. Total of 3 core biopsies were performed with an 18 gauge core device. Three adequate specimens obtained and placed in formalin. Gel-Foam slurry was injected as the 17 gauge needle was removed. Bandage placed over the puncture site. FINDINGS: Core biopsies obtained from the right hepatic lobe. No immediate bleeding or hematoma formation. IMPRESSION: Successful ultrasound-guided core biopsies from the right hepatic lobe. Electronically Signed   By: Juliene Balder M.D.   On: 07/04/2023 16:25   IR Removal Tun Cv Cath W/O FL Result Date: 07/04/2023 INDICATION: 78 year old female with history of AKI requiring dialysis, status post tunneled dialysis catheter placement 06/27/2023 presents with recovery of renal function. Request for tunneled dialysis catheter removal. EXAM: REMOVAL OF TUNNELED HEMODIALYSIS CATHETER MEDICATIONS: None COMPLICATIONS: None immediate. PROCEDURE: Informed written consent was obtained from the patient following an explanation of the procedure, risks, benefits and alternatives to treatment. A time out was performed prior  to the initiation of the procedure. Sterile technique was utilized including mask, sterile gloves, sterile drape, and hand hygiene. ChloraPrep was used to prep the patient's right neck, chest and existing catheter. 1% lidocaine  was injected around the  catheter and the subcutaneous tunnel. The catheter was removed intact by applying gentle manual retraction. Hemostasis was obtained with manual compression. A dressing was placed. The patient tolerated the procedure well without immediate post procedural complication. IMPRESSION: Successful removal of tunneled dialysis catheter. Performed by: Aimee Han, PA-C Electronically Signed   By: Marcey Moan M.D.   On: 07/04/2023 09:00       Assessment / Plan:    78 y/o female here with the following:   Elevated liver enzymes - mixed pattern AKI s/p renal biopsy Dilated bile duct on MRCP Pancreatic cyst   See prior notes for full details. She is s/p liver biopsy yesterday, hopefully results back in the next 24 hours or so. Ddx includes shock liver, autoimmune hepatitis, infectious.  I reviewed her images with advanced endoscopy who felt less likely she had a biliary obstruction causing this. She does have some fusiform dilation of the bile duct and a large pancreatic cyst, needs EUS for that at some point, but do not have ability to do that inpatient right now.   LAEs remain the same. AKI continues to improve.  Further recommendations pending liver biopsy results. Continue to trend labs.  Marcey Naval, MD [facility]

## 2023-07-05 NOTE — Plan of Care (Signed)
   Problem: Clinical Measurements: Goal: Ability to maintain clinical measurements within normal limits will improve Outcome: Progressing

## 2023-07-05 NOTE — Plan of Care (Signed)
  Problem: Health Behavior/Discharge Planning: Goal: Ability to manage health-related needs will improve Outcome: Progressing   Problem: Clinical Measurements: Goal: Ability to maintain clinical measurements within normal limits will improve Outcome: Progressing   Problem: Activity: Goal: Risk for activity intolerance will decrease Outcome: Progressing   Problem: Nutrition: Goal: Adequate nutrition will be maintained Outcome: Progressing   Problem: Coping: Goal: Level of anxiety will decrease Outcome: Progressing   Problem: Elimination: Goal: Will not experience complications related to bowel motility Outcome: Progressing Goal: Will not experience complications related to urinary retention Outcome: Progressing   Problem: Pain Managment: Goal: General experience of comfort will improve and/or be controlled Outcome: Progressing   Problem: Safety: Goal: Ability to remain free from injury will improve Outcome: Progressing   Problem: Skin Integrity: Goal: Risk for impaired skin integrity will decrease Outcome: Progressing   Problem: Urinary Elimination: Goal: Signs and symptoms of infection will decrease Outcome: Progressing   Problem: Education: Goal: Knowledge of disease and its progression will improve Outcome: Progressing   Problem: Health Behavior/Discharge Planning: Goal: Ability to manage health-related needs will improve Outcome: Progressing   Problem: Clinical Measurements: Goal: Complications related to the disease process or treatment will be avoided or minimized Outcome: Progressing Goal: Dialysis access will remain free of complications Outcome: Progressing   Problem: Activity: Goal: Activity intolerance will improve Outcome: Progressing   Problem: Fluid Volume: Goal: Fluid volume balance will be maintained or improved Outcome: Progressing   Problem: Nutritional: Goal: Ability to make appropriate dietary choices will improve Outcome:  Progressing   Problem: Respiratory: Goal: Respiratory symptoms related to disease process will be avoided Outcome: Progressing   Problem: Self-Concept: Goal: Body image disturbance will be avoided or minimized Outcome: Progressing   Problem: Urinary Elimination: Goal: Progression of disease will be identified and treated Outcome: Progressing   Problem: Education: Goal: Knowledge of General Education information will improve Description: Including pain rating scale, medication(s)/side effects and non-pharmacologic comfort measures Outcome: Progressing   Problem: Health Behavior/Discharge Planning: Goal: Ability to manage health-related needs will improve Outcome: Progressing   Problem: Clinical Measurements: Goal: Ability to maintain clinical measurements within normal limits will improve Outcome: Progressing Goal: Will remain free from infection Outcome: Progressing Goal: Diagnostic test results will improve Outcome: Progressing Goal: Respiratory complications will improve Outcome: Progressing Goal: Cardiovascular complication will be avoided Outcome: Progressing   Problem: Activity: Goal: Risk for activity intolerance will decrease Outcome: Progressing   Problem: Nutrition: Goal: Adequate nutrition will be maintained Outcome: Progressing   Problem: Coping: Goal: Level of anxiety will decrease Outcome: Progressing   Problem: Elimination: Goal: Will not experience complications related to bowel motility Outcome: Progressing Goal: Will not experience complications related to urinary retention Outcome: Progressing   Problem: Pain Managment: Goal: General experience of comfort will improve and/or be controlled Outcome: Progressing   Problem: Safety: Goal: Ability to remain free from injury will improve Outcome: Progressing   Problem: Skin Integrity: Goal: Risk for impaired skin integrity will decrease Outcome: Progressing

## 2023-07-05 NOTE — Progress Notes (Signed)
 PROGRESS NOTE    Marissa Orozco  FMW:[id number] DOB: February 17, 1945 DOA: 06/17/2023 PCP: Jarold Medici, MD   Brief Narrative:  Marissa Orozco is an 78 y.o. female past medical history of CVA, essential hypertension hyperlipidemia history of kidney stones coming to the ED complaining of fatigue was found to have low blood pressure, she relates significant poor intake of less than the days, called her PCP to told her to come to the ED.  Was found to be in acute kidney injury and underwent extensive renal workup.  Had to have a dialysis catheter placed and dialyzed on 06/25/2023.  Concern for glomerulonephritis however per Nephrology, renal biopsy done and revealed chronic changes, AIN and some TMA but no immune complex mediated or crescentic glomerular nephritis noted.  Appears significantly fatigued and deconditioned so PT/OT are recommending CIR once medically stable.  Incidentally also noted to have abnormal LFTs so we will do further workup for this as well and obtain RUQ U/S which showed Fatty Liver Disease and Acute Hepatitis Panel Negative.  Pursuing further hepatic workup after discussion with GI will be obtaining liver biopsy since enzymes are stagnant; MRCP done and several labs done concerning for autoimmune hepatitis so GI adding additional labs.   Patient underwent liver biopsy on the afternoon of 07/04/23 she had 3 cores in the right hepatic lobe. Still awaiting Bx results.   Assessment and Plan:  ATN/ Normal Anion Gap Metabolic Acidosis/ Uremic: Hyperphosphatemia -BUN/Cr Trend: Recent Labs  Lab 06/28/23 0522 06/29/23 0451 06/30/23 0543 07/01/23 9347 07/02/23 0712 07/03/23 0807 07/04/23 0547  BUN 61* 73* 76* 72* 69* 57* 58*  CREATININE 3.25* 3.30* 2.66* 2.45* 2.00* 2.09* 1.94*  -U/A showed white blood cells and protein.  C3 and C4 complements are low, ANA positive as well as p-ANCA 1:160; No M spike on SPEP. -Phos Level went from 5.0 -> 5.2 -> 5.6 -> 4.3 -> 4.5 -> 4.7 ->  4.8 -Has a slight metabolic acidosis with a CO2 of 21, anion gap of 9, chloride level 150 -Avoid Nephrotoxic Medications, Contrast Dyes, Hypotension and Dehydration to Ensure Adequate Renal Perfusion and will need to Renally Adjust Meds -Status post renal biopsy on 06/27/2023 by IR as there is concern for Glomerulonephritis.  No biopsy revealed chronic changes, AIN, and some TMA but no immune complex mediated and crescentic glomerulonephritis -Status post dialysis catheter placement and first dialysis in 06/25/2023. Follow ongoing renal recovery. -Continue to Monitor and Trend Renal Function carefully and repeat CMP in the AM  -Further Management per Nephrology; Renal Fxn is improving  -PT/OT consulted and recommending CIR but will hold discharging her to CIR on 6/29 given further evaluation by GI.  Nephrology recommends to discontinue TDC now  Proteinuria: Urine protein/creatinine ratio was 2530 and currently getting workup as above.  She is off of ARB given her AKI; Nephrology now signed off   Microscopic Hematuria: Likely in the setting of UTI.  Antibiotics have now been discontinued.  She has had renal workup as above   Essential HTN (Hypertension): Holding ARB in the setting of ATN. Blood pressure continues to be stable and now on the softer side. IV Lasix  40 mg BID changed po Furosemide  40 mg po BID. CTM BP per Protcol. Last BP reading was 121/64   Abnormal LFTs / Hyperbilirubinemia: AST is now 927 -> 897 -> 855 -> 677 -> 676 -> 689 and ALT is 448 -> 506 -> 520 -> 451 -> 456 -> 446. T Bili is now 4.0 (1.9  Indirect, 2.1 Direct) -> 3.9 -> 4.1 -> 3.5 -> 4.3 on last check. D/C'd Acetaminophen  given Abnormal LFTs. Checked RUQ U/S and showed Fatty Liver Disease. Acute Hepatitis Panel Negative. CTM and Trend Hepatic Fxn Panel and repeat CMP in the AM. D/w GI informally and they are recommending checking an IgG level (2038), anti-smooth muscle antibody +, CMV IgM <30.0 and IgG >10.00, Epstein-Barr IgM  56.0, IgG >600, and HSV non-reactive as well as getting a noncontrast MRCP. MRCP done and showed some fusiform dilatation of the common bile duct to 1 cm some intrahepatic dilatation the left side as well as incidentally noting a 5.2 cm x 4.6 cm pancreatic cyst which will need further evaluation by EUS and GI feels that this can be done in an outpatient setting.  Nephrology is checking iron studies to evaluate for hemochromatosis.   -GI formally consulted and discussing with advanced endoscopy colleagues to see what they think about pursuing an ERCP and they do not think that she has a biliary obstruction causing his part of enzyme abnormality and have recommended against ERCP at this time. GI recommends continuing trending LFTs and since LFT trend did not improve she underwent a liver biopsy on 07/04/2023 with pending results.  Will continue to monitor and GI is also checking a additional laboratory data including antimitochondrial antibody, IgG4 and inflammatory markers - GI feels that this could be shock liver versus autoimmune hepatitis versus infectious and awaiting biopsy results.  Pre-Diabetes: Recent hemoglobin A1c was 6.2 on 06/02/2023.  CTM CBGs per protocol.  Blood Sugars ranging from 85-181 on daily BMP/CMP's.   E. coli UTI: Completed a 7 day course of IV Ceftriaxone .? Reactive Leukocytosis given fluctuations but now resolved and WBC is 10.0. CTM and Trend and repeat CBC in the AM.    Hyperlipidemia: Discontinue Ezetimibe  10 mg po Daily. Currently holding Atorvastatin  80 mg po Daily given Abnormal LFTs   History of CVA: Holding Aspirin  81 mg po daily and Atorvastatin  80 mg po Daily.  Glaucoma: C/w Dorzolamide -Timolol  2-0.5% ophthalmic solution 1 drop both Eyes BID and Latanoprost  1 drop Both Eyes qHS   Hypokalemia: K+ is now 4.0. Hold and D/C po KCL 20 mEQ BID for now. CTM and Trend and Replete as Necessary. Goal K+ > 4.0 and Mag > 2.0. Repeat CMP and Mag Level in the AM  Thrombocytopenia:  Improved. Platelet Count went from 107 at its lowest and is now 155. CTM and for S/Sx of Bleeding; No overt bleeding noted. Repeat CBC in the AM   Hypoalbuminemia: Patient's Albumin Level Trending down and is now 1.6. CTM and Trend and repeat CMP in the AM  Class I Obesity: Complicates overall prognosis and care. Estimated body mass index is 30.33 kg/m as calculated from the following:   Height as of this encounter: 5' 3.5 (1.613 m).   Weight as of this encounter: 78.9 kg. Weight Loss and Dietary Counseling given   DVT prophylaxis: Place and maintain sequential compression device Start: 06/20/23 1531    Code Status: Full Code Family Communication: No family at bedside   Disposition Plan:  Level of care: Telemetry Medical Status is: Inpatient Remains inpatient appropriate because: Needs further clinical improvement and clearance by the specialists   Consultants:  Gastroenterology Nephrology Interventional radiology CIR  Procedures:  As delineated as above  Antimicrobials:  Anti-infectives (From admission, onward)    Start     Dose/Rate Route Frequency Ordered Stop   06/24/23 1430  ceFAZolin  (ANCEF ) IVPB 2g/100 mL premix  2 g 200 mL/hr over 30 Minutes Intravenous  Once 06/24/23 1333 06/25/23 0743   06/24/23 1415  ceFAZolin  (ANCEF ) IVPB 2g/100 mL premix  Status:  Discontinued        2 g 200 mL/hr over 30 Minutes Intravenous  Once 06/24/23 1327 06/24/23 1333   06/19/23 2000  cefTRIAXone  (ROCEPHIN ) 1 g in sodium chloride  0.9 % 100 mL IVPB        1 g 200 mL/hr over 30 Minutes Intravenous Every 24 hours 06/19/23 1928 06/26/23 0748       Subjective: Seen and examined at bedside and she felt about the same.  Still weak and fatigued.  Denied any abdominal discomfort.  Having bowel movements.  No other concerns or complaints at this time.  Objective: Vitals:   07/04/23 2100 07/05/23 0523 07/05/23 0715 07/05/23 1634  BP:  (!) 113/55 (!) 107/55 121/64  Pulse:  62 65 67   Resp:  20  18  Temp:  97.8 F (36.6 C) 98.1 F (36.7 C) 98.2 F (36.8 C)  TempSrc:  Oral Oral Oral  SpO2:  99% 99% 100%  Weight: 80.9 kg     Height:        Intake/Output Summary (Last 24 hours) at 07/05/2023 1813 Last data filed at 07/05/2023 1000 Gross per 24 hour  Intake 540 ml  Output 750 ml  Net -210 ml   Filed Weights   07/02/23 2100 07/04/23 0702 07/04/23 2100  Weight: 78.9 kg 78.9 kg 80.9 kg   Examination: Physical Exam:  Constitutional: WN/WD obese chronically ill-appearing AAF in no acute distress appears fatigued Respiratory: Diminished to auscultation bilaterally, no wheezing, rales, rhonchi or crackles. Normal respiratory effort and patient is not tachypenic. No accessory muscle use.  Unlabored breathing Cardiovascular: RRR, no murmurs / rubs / gallops. S1 and S2 auscultated. No extremity edema. Abdomen: Soft, non-tender, distended secondary to body habitus. Bowel sounds positive.  GU: Deferred. Musculoskeletal: No clubbing / cyanosis of digits/nails. No joint deformity upper and lower extremities.  Skin: No rashes, lesions, ulcers on a limited skin evaluation. No induration; Warm and dry.  Neurologic: CN 2-12 grossly intact with no focal deficits. Romberg sign and cerebellar reflexes not assessed.  Psychiatric: Normal judgment and insight. Alert and oriented x 3. Normal mood and appropriate affect.   Data Reviewed: I have personally reviewed following labs and imaging studies  CBC: Recent Labs  Lab 07/01/23 1036 07/02/23 0712 07/03/23 0807 07/04/23 0547 07/05/23 0518  WBC 15.1* 15.5* 13.0* 12.9* 10.0  NEUTROABS 11.4* 11.2* 10.3* 8.2* 6.6  HGB 14.2 13.1 12.8 12.7 12.4  HCT 41.1 38.4 38.0 36.7 36.5  MCV 88.4 89.9 90.9 90.8 91.5  PLT 120* 139* 133* 129* 155   Basic Metabolic Panel: Recent Labs  Lab 07/01/23 0652 07/02/23 0712 07/03/23 0807 07/04/23 0547 07/05/23 0518  NA 141 145 143 143 140  K 4.2 4.1 4.3 4.7 4.0  CL 110 115* 114* 116* 117*  CO2  21* 21* 23 18* 19*  GLUCOSE 132* 181* 181* 85 89  BUN 72* 69* 57* 58* 54*  CREATININE 2.45* 2.00* 2.09* 1.94* 1.63*  CALCIUM  8.6* 8.8* 8.7* 8.7* 8.1*  MG 1.9 1.8 1.7 1.8 1.9  PHOS 4.2 4.3 4.5 4.7* 4.8*   GFR: Estimated Creatinine Clearance: 28.5 mL/min (A) (by C-G formula based on SCr of 1.63 mg/dL (H)). Liver Function Tests: Recent Labs  Lab 07/01/23 0652 07/02/23 0712 07/03/23 0807 07/04/23 0547 07/05/23 0518  AST 897* 855* 677* 676* 689*  ALT 506*  520* 451* 456* 446*  ALKPHOS 248* 221* 227* 221* 216*  BILITOT 3.9* 4.1* 3.5* 3.8* 4.3*  PROT 5.4* 5.6* 5.4* 5.3* 5.3*  ALBUMIN 1.8* 1.8* 1.7* 1.7* 1.6*   No results for input(s): LIPASE, AMYLASE in the last 168 hours. No results for input(s): AMMONIA in the last 168 hours. Coagulation Profile: Recent Labs  Lab 07/02/23 1543 07/03/23 0807 07/04/23 0547  INR 1.5* 1.5* 1.5*   Cardiac Enzymes: No results for input(s): CKTOTAL, CKMB, CKMBINDEX, TROPONINI in the last 168 hours. BNP (last 3 results) No results for input(s): PROBNP in the last 8760 hours. HbA1C: No results for input(s): HGBA1C in the last 72 hours. CBG: No results for input(s): GLUCAP in the last 168 hours. Lipid Profile: No results for input(s): CHOL, HDL, LDLCALC, TRIG, CHOLHDL, LDLDIRECT in the last 72 hours. Thyroid  Function Tests: No results for input(s): TSH, T4TOTAL, FREET4, T3FREE, THYROIDAB in the last 72 hours. Anemia Panel: Recent Labs    07/03/23 0807  FERRITIN 309*  TIBC 235*  IRON 73   Sepsis Labs: No results for input(s): PROCALCITON, LATICACIDVEN in the last 168 hours.  Recent Results (from the past 240 hours)  MRSA Next Gen by PCR, Nasal     Status: None   Collection Time: 06/29/23  8:21 AM   Specimen: Nasal Mucosa; Nasal Swab  Result Value Ref Range Status   MRSA by PCR Next Gen NOT DETECTED NOT DETECTED Final    Comment: (NOTE) The GeneXpert MRSA Assay (FDA approved for NASAL  specimens only), is one component of a comprehensive MRSA colonization surveillance program. It is not intended to diagnose MRSA infection nor to guide or monitor treatment for MRSA infections. Test performance is not FDA approved in patients less than 47 years old. Performed at [facility], [address] [place], [place] [address]     Radiology Studies: US  BIOPSY (LIVER) Result Date: 07/04/2023 INDICATION: Elevated LFTs.  Request for random liver biopsy. EXAM: ULTRASOUND-GUIDED LIVER BIOPSY MEDICATIONS: Moderate sedation ANESTHESIA/SEDATION: Moderate (conscious) sedation was employed during this procedure. A total of Versed  1 mg and Fentanyl  75 mcg was administered intravenously by the radiology nurse. Total intra-service moderate Sedation Time: 20 minutes. The patient's level of consciousness and vital signs were monitored continuously by radiology nursing throughout the procedure under my direct supervision. FLUOROSCOPY TIME:  None COMPLICATIONS: None immediate. PROCEDURE: Informed written consent was obtained from the patient after a thorough discussion of the procedural risks, benefits and alternatives. All questions were addressed. A timeout was performed prior to the initiation of the procedure. Abdomen was evaluated with ultrasound. The right hepatic lobe was identified. The right side of the abdomen was prepped with chlorhexidine  and sterile field was created. Skin was anesthetized using 1% lidocaine . Small incision was made. Using ultrasound guidance, 17 gauge coaxial needle was directed into the right hepatic lobe. Total of 3 core biopsies were performed with an 18 gauge core device. Three adequate specimens obtained and placed in formalin. Gel-Foam slurry was injected as the 17 gauge needle was removed. Bandage placed over the puncture site. FINDINGS: Core biopsies obtained from the right hepatic lobe. No immediate bleeding or hematoma formation. IMPRESSION: Successful  ultrasound-guided core biopsies from the right hepatic lobe. Electronically Signed   By: Juliene Balder M.D.   On: 07/04/2023 16:25   IR Removal Tun Cv Cath W/O FL Result Date: 07/04/2023 INDICATION: 78 year old female with history of AKI requiring dialysis, status post tunneled dialysis catheter placement 06/27/2023 presents with recovery of renal  function. Request for tunneled dialysis catheter removal. EXAM: REMOVAL OF TUNNELED HEMODIALYSIS CATHETER MEDICATIONS: None COMPLICATIONS: None immediate. PROCEDURE: Informed written consent was obtained from the patient following an explanation of the procedure, risks, benefits and alternatives to treatment. A time out was performed prior to the initiation of the procedure. Sterile technique was utilized including mask, sterile gloves, sterile drape, and hand hygiene. ChloraPrep was used to prep the patient's right neck, chest and existing catheter. 1% lidocaine  was injected around the catheter and the subcutaneous tunnel. The catheter was removed intact by applying gentle manual retraction. Hemostasis was obtained with manual compression. A dressing was placed. The patient tolerated the procedure well without immediate post procedural complication. IMPRESSION: Successful removal of tunneled dialysis catheter. Performed by: Aimee Han, PA-C Electronically Signed   By: Marcey Moan M.D.   On: 07/04/2023 09:00   Scheduled Meds:  Chlorhexidine  Gluconate Cloth  6 each Topical Q0600   Chlorhexidine  Gluconate Cloth  6 each Topical Q0600   docusate sodium   100 mg Oral BID   dorzolamide -timolol   1 drop Both Eyes BID   furosemide   40 mg Oral BID   latanoprost   1 drop Both Eyes QHS   Continuous Infusions:   LOS: 17 days   Alejandro Marker, DO [facility] Available via Epic secure chat 7am-7pm After these hours, please refer to coverage provider listed on [email or web address] 07/05/2023, 6:13 PM

## 2023-07-05 NOTE — Progress Notes (Signed)
  Inpatient Rehabilitation Admissions Coordinator   I met with patient at bedside. I await medical readiness to admit to CIR .  Heron Leavell, RN, MSN Rehab Admissions Coordinator [phone] 07/05/2023 12:07 PM

## 2023-07-06 DIAGNOSIS — N179 Acute kidney failure, unspecified: Secondary | ICD-10-CM | POA: Diagnosis not present

## 2023-07-06 DIAGNOSIS — R978 Other abnormal tumor markers: Secondary | ICD-10-CM

## 2023-07-06 LAB — CBC WITH DIFFERENTIAL/PLATELET
Abs Immature Granulocytes: 0.07 10*3/uL (ref 0.00–0.07)
Basophils Absolute: 0.1 10*3/uL (ref 0.0–0.1)
Basophils Relative: 1 %
Eosinophils Absolute: 0.2 10*3/uL (ref 0.0–0.5)
Eosinophils Relative: 2 %
HCT: 41.4 % (ref 36.0–46.0)
Hemoglobin: 13.5 g/dL (ref 12.0–15.0)
Immature Granulocytes: 1 %
Lymphocytes Relative: 20 %
Lymphs Abs: 2.1 10*3/uL (ref 0.7–4.0)
MCH: 31.3 pg (ref 26.0–34.0)
MCHC: 32.6 g/dL (ref 30.0–36.0)
MCV: 96.1 fL (ref 80.0–100.0)
Monocytes Absolute: 1.1 10*3/uL — ABNORMAL HIGH (ref 0.1–1.0)
Monocytes Relative: 11 %
Neutro Abs: 7 10*3/uL (ref 1.7–7.7)
Neutrophils Relative %: 65 %
Platelets: 107 10*3/uL — ABNORMAL LOW (ref 150–400)
RBC: 4.31 MIL/uL (ref 3.87–5.11)
RDW: 23.5 % — ABNORMAL HIGH (ref 11.5–15.5)
Smear Review: NORMAL
WBC: 10.7 10*3/uL — ABNORMAL HIGH (ref 4.0–10.5)
nRBC: 0 % (ref 0.0–0.2)

## 2023-07-06 LAB — COMPREHENSIVE METABOLIC PANEL WITH GFR
ALT: 461 U/L — ABNORMAL HIGH (ref 0–44)
AST: 700 U/L — ABNORMAL HIGH (ref 15–41)
Albumin: 1.6 g/dL — ABNORMAL LOW (ref 3.5–5.0)
Alkaline Phosphatase: 226 U/L — ABNORMAL HIGH (ref 38–126)
Anion gap: 7 (ref 5–15)
BUN: 39 mg/dL — ABNORMAL HIGH (ref 8–23)
CO2: 21 mmol/L — ABNORMAL LOW (ref 22–32)
Calcium: 8.1 mg/dL — ABNORMAL LOW (ref 8.9–10.3)
Chloride: 112 mmol/L — ABNORMAL HIGH (ref 98–111)
Creatinine, Ser: 1.51 mg/dL — ABNORMAL HIGH (ref 0.44–1.00)
GFR, Estimated: 35 mL/min — ABNORMAL LOW (ref >60)
Glucose, Bld: 123 mg/dL — ABNORMAL HIGH (ref 70–99)
Potassium: 3.6 mmol/L (ref 3.5–5.1)
Sodium: 140 mmol/L (ref 135–145)
Total Bilirubin: 3.5 mg/dL — ABNORMAL HIGH (ref 0.0–1.2)
Total Protein: 5.3 g/dL — ABNORMAL LOW (ref 6.5–8.1)

## 2023-07-06 LAB — SURGICAL PATHOLOGY

## 2023-07-06 LAB — PHOSPHORUS: Phosphorus: 3.7 mg/dL (ref 2.5–4.6)

## 2023-07-06 LAB — MAGNESIUM: Magnesium: 1.8 mg/dL (ref 1.7–2.4)

## 2023-07-06 MED ORDER — PREDNISONE 20 MG PO TABS
40.0000 mg | ORAL_TABLET | Freq: Every day | ORAL | Status: DC
  Administered 2023-07-06 – 2023-07-07 (×2): 40 mg via ORAL
  Filled 2023-07-06 (×2): qty 2

## 2023-07-06 NOTE — Progress Notes (Signed)
 Physical Therapy Treatment Patient Details Name: Marissa Orozco MRN: [id number] DOB: Jan 04, 1946 Today's Date: 07/06/2023   History of Present Illness Marissa Orozco is a 78 y.o. female who presented to ED with complaints of low blood pressure and fatigue. Pt found to have an acute kidney injury. Pt underwent Rt kidney biopsy 6/23. PMH: hx of CVA, HTN, HLD, hx of kidney stones    PT Comments  Pt reports continued fatigue, but is motivated to participate in therapy session. Session focused on functional strengthening and large muscle group exercises with high reps to promote endurance and improve ease of transfers and mobility. Pt requiring min assist for transfers, but continued to be limited in ability to ambulate further. Patient will benefit from intensive inpatient follow-up therapy, >3 hours/day in order to address deficits and maximize functional independence.    If plan is discharge home, recommend the following: A lot of help with walking and/or transfers;A lot of help with bathing/dressing/bathroom;Assist for transportation;Help with stairs or ramp for entrance   Can travel by private vehicle        Equipment Recommendations  Rolling walker (2 wheels);BSC/3in1;Wheelchair (measurements PT)    Recommendations for Other Services       Precautions / Restrictions Precautions Precautions: Fall Recall of Precautions/Restrictions: Intact Restrictions Weight Bearing Restrictions Per Provider Order: No     Mobility  Bed Mobility Overal bed mobility: Needs Assistance Bed Mobility: Supine to Sit     Supine to sit: Min assist     General bed mobility comments: Pt able to sit up trunk with HOB elevated, minA to scoot R hip out to edge of bed    Transfers Overall transfer level: Needs assistance Equipment used: Rolling walker (2 wheels) Transfers: Sit to/from Stand, Bed to chair/wheelchair/BSC Sit to Stand: Min assist   Step pivot transfers: Contact guard assist        General transfer comment: Rocking forward for momentum, cues for nose over toes, minA to rise    Ambulation/Gait                   Stairs             Wheelchair Mobility     Tilt Bed    Modified Rankin (Stroke Patients Only)       Balance Overall balance assessment: Needs assistance Sitting-balance support: Feet supported, Bilateral upper extremity supported Sitting balance-Leahy Scale: Fair     Standing balance support: Bilateral upper extremity supported, During functional activity, Reliant on assistive device for balance Standing balance-Leahy Scale: Poor                              Communication Communication Communication: No apparent difficulties  Cognition Arousal: Alert Behavior During Therapy: WFL for tasks assessed/performed   PT - Cognitive impairments: No apparent impairments                         Following commands: Intact      Cueing Cueing Techniques: Verbal cues  Exercises Other Exercises Other Exercises: Supine: bridging x 10 (2 sets) Other Exercises: Sitting: circuit with marching 30s, then pillow punches x 20 reps (2 sets of each) Other Exercises: Supine with HOB elevated: rope pulls with BUE x 5 Other Exercises: Serial sit to stands x 3    General Comments        Pertinent Vitals/Pain Pain Assessment Pain Assessment: No/denies pain  Home Living                          Prior Function            PT Goals (current goals can now be found in the care plan section) Acute Rehab PT Goals Patient Stated Goal: to get strong enough to go home Potential to Achieve Goals: Good Progress towards PT goals: Progressing toward goals    Frequency    Min 3X/week      PT Plan      Co-evaluation              AM-PAC PT 6 Clicks Mobility   Outcome Measure  Help needed turning from your back to your side while in a flat bed without using bedrails?: A Little Help needed  moving from lying on your back to sitting on the side of a flat bed without using bedrails?: A Little Help needed moving to and from a bed to a chair (including a wheelchair)?: A Little Help needed standing up from a chair using your arms (e.g., wheelchair or bedside chair)?: A Little Help needed to walk in hospital room?: Total Help needed climbing 3-5 steps with a railing? : Total 6 Click Score: 14    End of Session Equipment Utilized During Treatment: Gait belt Activity Tolerance: Patient tolerated treatment well Patient left: in chair;with call bell/phone within reach Nurse Communication: Mobility status PT Visit Diagnosis: Unsteadiness on feet (R26.81);Muscle weakness (generalized) (M62.81);Difficulty in walking, not elsewhere classified (R26.2)     Time: 8880-8853 PT Time Calculation (min) (ACUTE ONLY): 27 min  Charges:    $Therapeutic Activity: 23-37 mins PT General Charges $$ ACUTE PT VISIT: 1 Visit                     Aleck Orozco, PT, DPT Acute Rehabilitation Services Office [phone]    Marissa Orozco 07/06/2023, 2:03 PM

## 2023-07-06 NOTE — Progress Notes (Signed)
 Occupational Therapy Treatment Patient Details Name: Marissa Orozco MRN: [id number] DOB: 05-19-1945 Today's Date: 07/06/2023   History of present illness Marissa Orozco is a 78 y.o. female who presented to ED with complaints of low blood pressure and fatigue. Pt found to have an acute kidney injury. Pt underwent Rt kidney biopsy 6/23. PMH: hx of CVA, HTN, HLD, hx of kidney stones   OT comments  Pt making good progress with functional goals. Pt OOB in recliner upon OT arrival. Pt required increased time with no physical assist to scoot hips forward in chair, min A sit - stand to RW, min A to step x 2 feet to [facility] with min verbal cues/visual cues for hand placement, initiation and directions. Pt managed clothing min A, transferred to BSC min A. Pt transferred to to sit EOB then participated in grooming/hygiene and UB dressing tasks sup with set up. Pt required mod A to manage LEs onto bed at end of session. OT will continue to follow acutely to maximize level of function and safety      If plan is discharge home, recommend the following:  A lot of help with bathing/dressing/bathroom;Assist for transportation;Help with stairs or ramp for entrance;Assistance with cooking/housework;A little help with walking and/or transfers   Equipment Recommendations  BSC/3in1    Recommendations for Other Services      Precautions / Restrictions Precautions Precautions: Fall Recall of Precautions/Restrictions: Intact Restrictions Weight Bearing Restrictions Per Provider Order: No       Mobility Bed Mobility Overal bed mobility: Needs Assistance Bed Mobility: Sit to Supine       Sit to supine: Mod assist   General bed mobility comments: increased time, assist with LEs back onto bed    Transfers Overall transfer level: Needs assistance Equipment used: Rolling walker (2 wheels)   Sit to Stand: Min assist Stand pivot transfers: Min assist   Step pivot transfers: Min assist     General  transfer comment: cues for hand placement, initiation and directional cues for stepping to [facility]     Balance Overall balance assessment: Needs assistance Sitting-balance support: Feet supported, Bilateral upper extremity supported Sitting balance-Leahy Scale: Fair     Standing balance support: Bilateral upper extremity supported, During functional activity, Reliant on assistive device for balance Standing balance-Leahy Scale: Poor                             ADL either performed or assessed with clinical judgement   ADL Overall ADL's : Needs assistance/impaired     Grooming: Wash/dry hands;Wash/dry face;Set up;Supervision/safety;Sitting           Upper Body Dressing : Set up;Supervision/safety;Sitting       Toilet Transfer: Minimal assistance;Rolling walker (2 wheels);Cueing for safety;BSC/3in1;Ambulation   Toileting- Clothing Manipulation and Hygiene: Minimal assistance;Sit to/from stand       Functional mobility during ADLs: Minimal assistance;Rolling walker (2 wheels);Cueing for safety      Extremity/Trunk Assessment Upper Extremity Assessment Upper Extremity Assessment: Generalized weakness   Lower Extremity Assessment Lower Extremity Assessment: Defer to PT evaluation   Cervical / Trunk Assessment Cervical / Trunk Assessment: Normal    Vision Baseline Vision/History: 0 No visual deficits Ability to See in Adequate Light: 0 Adequate Patient Visual Report: No change from baseline     Perception     Praxis     Communication Communication Communication: No apparent difficulties   Cognition Arousal: Alert Behavior During Therapy: [facility]  for tasks assessed/performed                                 Following commands: Intact        Cueing   Cueing Techniques: Verbal cues  Exercises      Shoulder Instructions       General Comments      Pertinent Vitals/ Pain       Pain Assessment Pain Assessment: No/denies pain  Home  Living                                          Prior Functioning/Environment              Frequency  Min 2X/week        Progress Toward Goals  OT Goals(current goals can now be found in the care plan section)  Progress towards OT goals: Progressing toward goals     Plan      Co-evaluation                 AM-PAC OT 6 Clicks Daily Activity     Outcome Measure   Help from another person eating meals?: None Help from another person taking care of personal grooming?: A Little Help from another person toileting, which includes using toliet, bedpan, or urinal?: A Little Help from another person bathing (including washing, rinsing, drying)?: A Lot Help from another person to put on and taking off regular upper body clothing?: A Little Help from another person to put on and taking off regular lower body clothing?: Total 6 Click Score: 16    End of Session Equipment Utilized During Treatment: Gait belt;Rolling walker (2 wheels);Other (comment) (BSC)  OT Visit Diagnosis: Unsteadiness on feet (R26.81);Other abnormalities of gait and mobility (R26.89);Muscle weakness (generalized) (M62.81)   Activity Tolerance Patient limited by fatigue   Patient Left with call bell/phone within reach;in bed;with family/visitor present   Nurse Communication Mobility status        Time: 8768-8751 OT Time Calculation (min): 17 min  Charges: OT General Charges $OT Visit: 1 Visit OT Treatments $Self Care/Home Management : 8-22 mins    Marissa Orozco 07/06/2023, 1:00 PM

## 2023-07-06 NOTE — Plan of Care (Signed)
  Problem: Health Behavior/Discharge Planning: Goal: Ability to manage health-related needs will improve Outcome: Progressing   Problem: Clinical Measurements: Goal: Ability to maintain clinical measurements within normal limits will improve Outcome: Progressing   Problem: Activity: Goal: Risk for activity intolerance will decrease Outcome: Progressing   Problem: Coping: Goal: Level of anxiety will decrease Outcome: Progressing   Problem: Elimination: Goal: Will not experience complications related to bowel motility Outcome: Progressing Goal: Will not experience complications related to urinary retention Outcome: Progressing   Problem: Pain Managment: Goal: General experience of comfort will improve and/or be controlled Outcome: Progressing   Problem: Safety: Goal: Ability to remain free from injury will improve Outcome: Progressing   Problem: Skin Integrity: Goal: Risk for impaired skin integrity will decrease Outcome: Progressing   Problem: Education: Goal: Knowledge of General Education information will improve Description: Including pain rating scale, medication(s)/side effects and non-pharmacologic comfort measures Outcome: Progressing   Problem: Health Behavior/Discharge Planning: Goal: Ability to manage health-related needs will improve Outcome: Progressing   Problem: Clinical Measurements: Goal: Ability to maintain clinical measurements within normal limits will improve Outcome: Progressing Goal: Will remain free from infection Outcome: Progressing Goal: Diagnostic test results will improve Outcome: Progressing Goal: Respiratory complications will improve Outcome: Progressing Goal: Cardiovascular complication will be avoided Outcome: Progressing   Problem: Activity: Goal: Risk for activity intolerance will decrease Outcome: Progressing   Problem: Nutrition: Goal: Adequate nutrition will be maintained Outcome: Progressing   Problem: Coping: Goal:  Level of anxiety will decrease Outcome: Progressing   Problem: Elimination: Goal: Will not experience complications related to bowel motility Outcome: Progressing Goal: Will not experience complications related to urinary retention Outcome: Progressing   Problem: Pain Managment: Goal: General experience of comfort will improve and/or be controlled Outcome: Progressing   Problem: Safety: Goal: Ability to remain free from injury will improve Outcome: Progressing   Problem: Skin Integrity: Goal: Risk for impaired skin integrity will decrease Outcome: Progressing   Problem: Urinary Elimination: Goal: Signs and symptoms of infection will decrease Outcome: Progressing   Problem: Education: Goal: Knowledge of disease and its progression will improve Outcome: Progressing   Problem: Health Behavior/Discharge Planning: Goal: Ability to manage health-related needs will improve Outcome: Progressing   Problem: Clinical Measurements: Goal: Complications related to the disease process or treatment will be avoided or minimized Outcome: Progressing Goal: Dialysis access will remain free of complications Outcome: Progressing   Problem: Activity: Goal: Activity intolerance will improve Outcome: Progressing   Problem: Fluid Volume: Goal: Fluid volume balance will be maintained or improved Outcome: Progressing   Problem: Nutritional: Goal: Ability to make appropriate dietary choices will improve Outcome: Progressing   Problem: Respiratory: Goal: Respiratory symptoms related to disease process will be avoided Outcome: Progressing   Problem: Self-Concept: Goal: Body image disturbance will be avoided or minimized Outcome: Progressing   Problem: Urinary Elimination: Goal: Progression of disease will be identified and treated Outcome: Progressing

## 2023-07-06 NOTE — Plan of Care (Signed)
   Problem: Health Behavior/Discharge Planning: Goal: Ability to manage health-related needs will improve Outcome: Progressing

## 2023-07-06 NOTE — Progress Notes (Signed)
 Progress Note   Subjective  Patient states she feels the same. Liver biopsy returned which I reviewed with her.    Objective   Vital signs in last 24 hours: Temp:  [98 F (36.7 C)-98.2 F (36.8 C)] 98 F (36.7 C) (07/02 0731) Pulse Rate:  [64-67] 66 (07/02 0731) Resp:  [18] 18 (07/02 0731) BP: (113-134)/(58-64) 129/62 (07/02 0731) SpO2:  [98 %-100 %] 98 % (07/02 0731) Weight:  [81 kg] 81 kg (07/02 0712) Last BM Date : 07/03/23 General:    AA female in NAD Neurologic:  Alert and oriented,  grossly normal neurologically. Psych:  Cooperative. Normal mood and affect.  Intake/Output from previous day: 07/01 0701 - 07/02 0700 In: 480 [P.O.:480] Out: 550 [Urine:550] Intake/Output this shift: Total I/O In: 400 [P.O.:400] Out: 900 [Urine:900]  Lab Results: Recent Labs    07/04/23 0547 07/05/23 0518 07/06/23 0535  WBC 12.9* 10.0 10.7*  HGB 12.7 12.4 13.5  HCT 36.7 36.5 41.4  PLT 129* 155 107*   BMET Recent Labs    07/04/23 0547 07/05/23 0518 07/06/23 0535  NA 143 140 140  K 4.7 4.0 3.6  CL 116* 117* 112*  CO2 18* 19* 21*  GLUCOSE 85 89 123*  BUN 58* 54* 39*  CREATININE 1.94* 1.63* 1.51*  CALCIUM  8.7* 8.1* 8.1*   LFT Recent Labs    07/06/23 0535  PROT 5.3*  ALBUMIN 1.6*  AST 700*  ALT 461*  ALKPHOS 226*  BILITOT 3.5*   PT/INR Recent Labs    07/04/23 0547  LABPROT 18.9*  INR 1.5*    Studies/Results: US  BIOPSY (LIVER) Result Date: 07/04/2023 INDICATION: Elevated LFTs.  Request for random liver biopsy. EXAM: ULTRASOUND-GUIDED LIVER BIOPSY MEDICATIONS: Moderate sedation ANESTHESIA/SEDATION: Moderate (conscious) sedation was employed during this procedure. A total of Versed  1 mg and Fentanyl  75 mcg was administered intravenously by the radiology nurse. Total intra-service moderate Sedation Time: 20 minutes. The patient's level of consciousness and vital signs were monitored continuously by radiology nursing throughout the procedure under my  direct supervision. FLUOROSCOPY TIME:  None COMPLICATIONS: None immediate. PROCEDURE: Informed written consent was obtained from the patient after a thorough discussion of the procedural risks, benefits and alternatives. All questions were addressed. A timeout was performed prior to the initiation of the procedure. Abdomen was evaluated with ultrasound. The right hepatic lobe was identified. The right side of the abdomen was prepped with chlorhexidine  and sterile field was created. Skin was anesthetized using 1% lidocaine . Small incision was made. Using ultrasound guidance, 17 gauge coaxial needle was directed into the right hepatic lobe. Total of 3 core biopsies were performed with an 18 gauge core device. Three adequate specimens obtained and placed in formalin. Gel-Foam slurry was injected as the 17 gauge needle was removed. Bandage placed over the puncture site. FINDINGS: Core biopsies obtained from the right hepatic lobe. No immediate bleeding or hematoma formation. IMPRESSION: Successful ultrasound-guided core biopsies from the right hepatic lobe. Electronically Signed   By: Juliene Balder M.D.   On: 07/04/2023 16:25    FINAL MICROSCOPIC DIAGNOSIS:  A. LIVER, RIGHT LOBE, NEEDLE CORE BIOPSY: -  Acute on chronic hepatitis with evidence of bridging necrosis, see note.  Note: The liver cores consist of both parenchymal and portal inflammation consisting predominantly of lymphocytes as well as abundant plasma cells with both an interface hepatitis and parenchymal apoptosis consistent with an acute on chronic pattern.  In addition, there are numerous histiocytes consistent with previous as well  as ongoing damage with areas of bridging necrosis highlighted by a reticulin stain showing areas of collapse.  In the background are the presence of both a mild to moderate number of neutrophils as well as the presence of scattered eosinophils.  Steatosis, ballooning degeneration and glycogenated nuclei are not  present.  Viral inclusions are not identified.  While there is a reactive bile ductular proliferation, prominent bile duct damage and cholestasis are not identified.  While there is evidence of bridging necrosis and collapse by reticulin stain significant fibrosis has not yet resulted (trichrome stain reviewed).  There is no stainable iron, and a PAS stain highlights the histiocytes but is otherwise unremarkable.  It is noted that serologic studies identify an elevated EBV IgM, but PCR is negative for viral load.  The patient's ANA as well as anti-smooth muscle actin is positive.  The overall serologic and morphologic features favor an autoimmune hepatitis, grade 4 of 4 and stage 0-1 of 4 per [employer]; however, drug-induced liver injury and viral injuries would still remain within the differential diagnosis but are not favored.  Clinical correlation recommended.  The case was discussed with Dr. Leigh on 07/06/2023.    Assessment / Plan:    78 year old female here with the following:  Elevated liver enzymes - mixed pattern -status post liver biopsy AKI s/p renal biopsy Dilated bile duct on MRCP Pancreatic cyst   See prior notes for full details.  Liver biopsy returned today and I discussed this with pathology.  Overall it is most consistent with autoimmune hepatitis without cirrhosis.  There are some chronicity to the changes there with acute inflammation.  While she did have EBV IgM positive the PCR was negative.  Drug-induced liver injury remains possible but she does not take anything that would typically cause autoimmune hepatitis.  Overall constellation of findings with her lab work and liver biopsy is most consistent with autoimmune hepatitis.  I discussed with her what this is and management options.  Acutely I recommend we start prednisone 40 mg daily.  She would be on this for a taper and then potentially add a thiopurine if TPMT is normal.  I would add TPMT enzyme  assay to her labs tomorrow. We discussed risk benefits of steroids, I think she would benefit from it at this time.  Recommend 40 mg daily for a week and then taper by 5 mg/week until done.  She will need close monitoring of her liver enzymes and trend IgG during this time.  Once out of the hospital I'd recommend referral to hepatology for long-term care of this and to get their opinion.  Recall she also has fusiform dilation of the bile duct and a large pancreatic cyst on imaging.  CA 19-9 is elevated to 139.  She will need an EUS and will discuss this further with her primary GI, Dr. Wilhelmenia to see if he can accommodate this in the near future to evaluate the bile duct and pancreatic cyst.  AKI continues to improve.  Will start steroids today, see how she is doing tomorrow and potentially discharge to rehab tomorrow.  Will need to coordinate how to do her labs from rehab etc. She agrees.   Marcey Leigh, MD [facility]

## 2023-07-06 NOTE — Progress Notes (Signed)
 PROGRESS NOTE    Marissa Orozco  FMW:[id number] DOB: April 16, 1945 DOA: 06/17/2023 PCP: Jarold Medici, MD   Brief Narrative:    78 y.o. female past medical history of CVA, essential hypertension hyperlipidemia history of kidney stones coming to the ED complaining of fatigue was found to have low blood pressure, she relates significant poor intake of less than the days, called her PCP to told her to come to the ED. Was found to be in acute kidney injury and underwent extensive renal workup. Had to have a dialysis catheter placed and dialyzed on 06/25/2023. Concern for glomerulonephritis however per Nephrology, renal biopsy done and revealed chronic changes, AIN and some TMA but no immune complex mediated or crescentic glomerular nephritis noted.  Status post liver biopsy done on 07/04/2023 and results are pending.    Assessment & Plan:  Acute tubular necrosis, proteinuria, POA: Resolved.  Patient had hemodialysis catheter placed and underwent hemodialysis.  Off of hemodialysis now and kidney function stable.  Nephrology has signed off.  Status post renal biopsy done on 06/27/2023 by IR but the biopsy revealed chronic changes, acute interstitial nephritis and some TMA no immune complex mediated increase enteric glomerulonephritis.  Essential hypertension: As needed intravenous hydralazine for systolic blood pressure greater than 160.  Continue with oral Lasix  40 mg p.o. twice daily.  Continue to monitor blood pressure closely  Transaminitis/elevated liver enzymes, POA: GI on board.  Status post MRCP which showed some fusiform dilatation of the common bile duct to 1 cm, some intrahepatic regurgitation on the left side as well as pancreatic cyst.  Patient will need outpatient EUS and GI follow-up on discharge. Status post liver biopsy, done on 07/04/2023, awaiting biopsy results.  Appreciate GIs input on the case.  Prediabetes, POA: Last hemoglobin A1c of 6.2% on 06/02/2023.  Follow blood glucose levels  closely.  E. coli urinary tract infection, POA: Patient has completed 7-day course of IV Rocephin .  No evidence of active infection at this time.  Hyperlipidemia, POA: Continue to hold statin in the setting of abnormal liver enzymes.  Ischemic CVA, POA: No acute issues  Glaucoma, POA: Continue with eyedrops.  Severe protein calorie malnutrition, POA: As evidenced by low albumin in the setting of multiple comorbidities.  Class I obesity, POA: Counseled extensively regarding diet modification and exercise to facilitate weight loss.  Disposition: Inpatient rehab  DVT prophylaxis: Place and maintain sequential compression device Start: 06/20/23 1531     Code Status: Full Code Family Communication: None at the bedside Status is: Inpatient Remains inpatient appropriate because: Physical deconditioning, awaiting liver biopsy results  Subjective:  Patient said that she feels good.  She denies any chest pain, vomiting, nausea or abdominal pain.  She wanted to know about the results of the liver biopsy and I told her that the results are still pending.  She is planning to go to patient rehab upon medical stabilization.  Examination:  General exam: Appears calm and comfortable  Respiratory system: Clear to auscultation. Respiratory effort normal. Cardiovascular system: S1 & S2 heard, RRR. No JVD, murmurs, rubs, gallops or clicks. No pedal edema. Gastrointestinal system: Abdomen is nondistended, soft and nontender. No organomegaly or masses felt. Normal bowel sounds heard. Central nervous system: Alert and oriented. No focal neurological deficits. Extremities: Symmetric 5 x 5 power. Skin: No rashes, lesions or ulcers Psychiatry: Judgement and insight appear normal. Mood & affect appropriate.      Diet Orders (From admission, onward)     Start  Ordered   07/04/23 1549  Diet renal with fluid restriction Fluid restriction: 1200 mL Fluid; Room service appropriate? Yes; Fluid  consistency: Thin  Diet effective now       Question Answer Comment  Fluid restriction: 1200 mL Fluid   Room service appropriate? Yes   Fluid consistency: Thin      07/04/23 1548            Objective: Vitals:   07/05/23 2041 07/06/23 0458 07/06/23 0712 07/06/23 0731  BP: (!) 134/58 113/62  129/62  Pulse: 67 64  66  Resp: 18 18  18   Temp: 98 F (36.7 C) 98 F (36.7 C)  98 F (36.7 C)  TempSrc: Oral Oral  Oral  SpO2: 100% 100%  98%  Weight:   81 kg   Height:        Intake/Output Summary (Last 24 hours) at 07/06/2023 0947 Last data filed at 07/06/2023 0600 Gross per 24 hour  Intake 240 ml  Output 550 ml  Net -310 ml   Filed Weights   07/04/23 0702 07/04/23 2100 07/06/23 0712  Weight: 78.9 kg 80.9 kg 81 kg    Scheduled Meds:  docusate sodium   100 mg Oral BID   dorzolamide -timolol   1 drop Both Eyes BID   furosemide   40 mg Oral BID   latanoprost   1 drop Both Eyes QHS   Continuous Infusions:  Nutritional status     Body mass index is 31.14 kg/m.  Data Reviewed:   CBC: Recent Labs  Lab 07/02/23 0712 07/03/23 0807 07/04/23 0547 07/05/23 0518 07/06/23 0535  WBC 15.5* 13.0* 12.9* 10.0 10.7*  NEUTROABS 11.2* 10.3* 8.2* 6.6 PENDING  HGB 13.1 12.8 12.7 12.4 13.5  HCT 38.4 38.0 36.7 36.5 41.4  MCV 89.9 90.9 90.8 91.5 96.1  PLT 139* 133* 129* 155 107*   Basic Metabolic Panel: Recent Labs  Lab 07/02/23 0712 07/03/23 0807 07/04/23 0547 07/05/23 0518 07/06/23 0535  NA 145 143 143 140 140  K 4.1 4.3 4.7 4.0 3.6  CL 115* 114* 116* 117* 112*  CO2 21* 23 18* 19* 21*  GLUCOSE 181* 181* 85 89 123*  BUN 69* 57* 58* 54* 39*  CREATININE 2.00* 2.09* 1.94* 1.63* 1.51*  CALCIUM  8.8* 8.7* 8.7* 8.1* 8.1*  MG 1.8 1.7 1.8 1.9 1.8  PHOS 4.3 4.5 4.7* 4.8* 3.7   GFR: Estimated Creatinine Clearance: 30.8 mL/min (A) (by C-G formula based on SCr of 1.51 mg/dL (H)). Liver Function Tests: Recent Labs  Lab 07/02/23 0712 07/03/23 0807 07/04/23 0547 07/05/23 0518  07/06/23 0535  AST 855* 677* 676* 689* 700*  ALT 520* 451* 456* 446* 461*  ALKPHOS 221* 227* 221* 216* 226*  BILITOT 4.1* 3.5* 3.8* 4.3* 3.5*  PROT 5.6* 5.4* 5.3* 5.3* 5.3*  ALBUMIN 1.8* 1.7* 1.7* 1.6* 1.6*   No results for input(s): LIPASE, AMYLASE in the last 168 hours. No results for input(s): AMMONIA in the last 168 hours. Coagulation Profile: Recent Labs  Lab 07/02/23 1543 07/03/23 0807 07/04/23 0547  INR 1.5* 1.5* 1.5*   Cardiac Enzymes: No results for input(s): CKTOTAL, CKMB, CKMBINDEX, TROPONINI in the last 168 hours. BNP (last 3 results) No results for input(s): PROBNP in the last 8760 hours. HbA1C: No results for input(s): HGBA1C in the last 72 hours. CBG: No results for input(s): GLUCAP in the last 168 hours. Lipid Profile: No results for input(s): CHOL, HDL, LDLCALC, TRIG, CHOLHDL, LDLDIRECT in the last 72 hours. Thyroid  Function Tests: No results for input(s): TSH,  T4TOTAL, FREET4, T3FREE, THYROIDAB in the last 72 hours. Anemia Panel: No results for input(s): VITAMINB12, FOLATE, FERRITIN, TIBC, IRON, RETICCTPCT in the last 72 hours. Sepsis Labs: No results for input(s): PROCALCITON, LATICACIDVEN in the last 168 hours.  Recent Results (from the past 240 hours)  MRSA Next Gen by PCR, Nasal     Status: None   Collection Time: 06/29/23  8:21 AM   Specimen: Nasal Mucosa; Nasal Swab  Result Value Ref Range Status   MRSA by PCR Next Gen NOT DETECTED NOT DETECTED Final    Comment: (NOTE) The GeneXpert MRSA Assay (FDA approved for NASAL specimens only), is one component of a comprehensive MRSA colonization surveillance program. It is not intended to diagnose MRSA infection nor to guide or monitor treatment for MRSA infections. Test performance is not FDA approved in patients less than 32 years old. Performed at [facility], [address] [place], [place] [address]          Radiology  Studies: US  BIOPSY (LIVER) Result Date: 07/04/2023 INDICATION: Elevated LFTs.  Request for random liver biopsy. EXAM: ULTRASOUND-GUIDED LIVER BIOPSY MEDICATIONS: Moderate sedation ANESTHESIA/SEDATION: Moderate (conscious) sedation was employed during this procedure. A total of Versed  1 mg and Fentanyl  75 mcg was administered intravenously by the radiology nurse. Total intra-service moderate Sedation Time: 20 minutes. The patient's level of consciousness and vital signs were monitored continuously by radiology nursing throughout the procedure under my direct supervision. FLUOROSCOPY TIME:  None COMPLICATIONS: None immediate. PROCEDURE: Informed written consent was obtained from the patient after a thorough discussion of the procedural risks, benefits and alternatives. All questions were addressed. A timeout was performed prior to the initiation of the procedure. Abdomen was evaluated with ultrasound. The right hepatic lobe was identified. The right side of the abdomen was prepped with chlorhexidine  and sterile field was created. Skin was anesthetized using 1% lidocaine . Small incision was made. Using ultrasound guidance, 17 gauge coaxial needle was directed into the right hepatic lobe. Total of 3 core biopsies were performed with an 18 gauge core device. Three adequate specimens obtained and placed in formalin. Gel-Foam slurry was injected as the 17 gauge needle was removed. Bandage placed over the puncture site. FINDINGS: Core biopsies obtained from the right hepatic lobe. No immediate bleeding or hematoma formation. IMPRESSION: Successful ultrasound-guided core biopsies from the right hepatic lobe. Electronically Signed   By: Juliene Balder M.D.   On: 07/04/2023 16:25           LOS: 18 days   Time spent= 35 mins    Deliliah Room, MD [facility]  If 7PM-7AM, please contact night-coverage  07/06/2023, 9:47 AM

## 2023-07-07 ENCOUNTER — Other Ambulatory Visit: Payer: Self-pay

## 2023-07-07 ENCOUNTER — Inpatient Hospital Stay: Admitting: Internal Medicine

## 2023-07-07 ENCOUNTER — Inpatient Hospital Stay (HOSPITAL_COMMUNITY)
Admission: AD | Admit: 2023-07-07 | Discharge: 2023-07-18 | DRG: 091 | Disposition: A | Discharge status: Home Health | Source: Intra-hospital | Attending: Physical Medicine and Rehabilitation | Admitting: Physical Medicine and Rehabilitation

## 2023-07-07 ENCOUNTER — Encounter (HOSPITAL_COMMUNITY): Payer: Self-pay | Admitting: Physical Medicine and Rehabilitation

## 2023-07-07 DIAGNOSIS — E876 Hypokalemia: Secondary | ICD-10-CM | POA: Diagnosis present

## 2023-07-07 DIAGNOSIS — Z6830 Body mass index (BMI) 30.0-30.9, adult: Secondary | ICD-10-CM | POA: Diagnosis not present

## 2023-07-07 DIAGNOSIS — I10 Essential (primary) hypertension: Secondary | ICD-10-CM | POA: Diagnosis present

## 2023-07-07 DIAGNOSIS — Z7984 Long term (current) use of oral hypoglycemic drugs: Secondary | ICD-10-CM

## 2023-07-07 DIAGNOSIS — Z7982 Long term (current) use of aspirin: Secondary | ICD-10-CM

## 2023-07-07 DIAGNOSIS — R7303 Prediabetes: Secondary | ICD-10-CM | POA: Diagnosis not present

## 2023-07-07 DIAGNOSIS — R42 Dizziness and giddiness: Secondary | ICD-10-CM | POA: Diagnosis not present

## 2023-07-07 DIAGNOSIS — K862 Cyst of pancreas: Secondary | ICD-10-CM | POA: Diagnosis present

## 2023-07-07 DIAGNOSIS — Z860100 Personal history of colon polyps, unspecified: Secondary | ICD-10-CM

## 2023-07-07 DIAGNOSIS — Z833 Family history of diabetes mellitus: Secondary | ICD-10-CM

## 2023-07-07 DIAGNOSIS — D72829 Elevated white blood cell count, unspecified: Secondary | ICD-10-CM | POA: Diagnosis not present

## 2023-07-07 DIAGNOSIS — E1165 Type 2 diabetes mellitus with hyperglycemia: Secondary | ICD-10-CM | POA: Diagnosis present

## 2023-07-07 DIAGNOSIS — Z8249 Family history of ischemic heart disease and other diseases of the circulatory system: Secondary | ICD-10-CM

## 2023-07-07 DIAGNOSIS — Z823 Family history of stroke: Secondary | ICD-10-CM

## 2023-07-07 DIAGNOSIS — Z87440 Personal history of urinary (tract) infections: Secondary | ICD-10-CM

## 2023-07-07 DIAGNOSIS — Z800 Family history of malignant neoplasm of digestive organs: Secondary | ICD-10-CM

## 2023-07-07 DIAGNOSIS — H409 Unspecified glaucoma: Secondary | ICD-10-CM | POA: Diagnosis present

## 2023-07-07 DIAGNOSIS — Z8261 Family history of arthritis: Secondary | ICD-10-CM

## 2023-07-07 DIAGNOSIS — E66811 Obesity, class 1: Secondary | ICD-10-CM | POA: Diagnosis present

## 2023-07-07 DIAGNOSIS — E785 Hyperlipidemia, unspecified: Secondary | ICD-10-CM | POA: Diagnosis present

## 2023-07-07 DIAGNOSIS — K754 Autoimmune hepatitis: Secondary | ICD-10-CM | POA: Diagnosis present

## 2023-07-07 DIAGNOSIS — N179 Acute kidney failure, unspecified: Secondary | ICD-10-CM | POA: Diagnosis present

## 2023-07-07 DIAGNOSIS — D696 Thrombocytopenia, unspecified: Secondary | ICD-10-CM | POA: Diagnosis present

## 2023-07-07 DIAGNOSIS — Z888 Allergy status to other drugs, medicaments and biological substances status: Secondary | ICD-10-CM

## 2023-07-07 DIAGNOSIS — R7401 Elevation of levels of liver transaminase levels: Secondary | ICD-10-CM | POA: Diagnosis not present

## 2023-07-07 DIAGNOSIS — T380X5A Adverse effect of glucocorticoids and synthetic analogues, initial encounter: Secondary | ICD-10-CM | POA: Diagnosis present

## 2023-07-07 DIAGNOSIS — K760 Fatty (change of) liver, not elsewhere classified: Secondary | ICD-10-CM | POA: Diagnosis present

## 2023-07-07 DIAGNOSIS — D72828 Other elevated white blood cell count: Secondary | ICD-10-CM | POA: Diagnosis present

## 2023-07-07 DIAGNOSIS — E43 Unspecified severe protein-calorie malnutrition: Secondary | ICD-10-CM | POA: Diagnosis present

## 2023-07-07 DIAGNOSIS — M79671 Pain in right foot: Secondary | ICD-10-CM | POA: Diagnosis not present

## 2023-07-07 DIAGNOSIS — Z8673 Personal history of transient ischemic attack (TIA), and cerebral infarction without residual deficits: Secondary | ICD-10-CM

## 2023-07-07 DIAGNOSIS — T501X5A Adverse effect of loop [high-ceiling] diuretics, initial encounter: Secondary | ICD-10-CM | POA: Diagnosis not present

## 2023-07-07 DIAGNOSIS — Z79899 Other long term (current) drug therapy: Secondary | ICD-10-CM

## 2023-07-07 DIAGNOSIS — M810 Age-related osteoporosis without current pathological fracture: Secondary | ICD-10-CM | POA: Diagnosis present

## 2023-07-07 DIAGNOSIS — R600 Localized edema: Secondary | ICD-10-CM | POA: Diagnosis not present

## 2023-07-07 DIAGNOSIS — H538 Other visual disturbances: Secondary | ICD-10-CM | POA: Diagnosis not present

## 2023-07-07 DIAGNOSIS — G5622 Lesion of ulnar nerve, left upper limb: Secondary | ICD-10-CM | POA: Diagnosis not present

## 2023-07-07 DIAGNOSIS — G7281 Critical illness myopathy: Secondary | ICD-10-CM | POA: Diagnosis not present

## 2023-07-07 DIAGNOSIS — K219 Gastro-esophageal reflux disease without esophagitis: Secondary | ICD-10-CM | POA: Diagnosis present

## 2023-07-07 DIAGNOSIS — Z87442 Personal history of urinary calculi: Secondary | ICD-10-CM

## 2023-07-07 DIAGNOSIS — Z90710 Acquired absence of both cervix and uterus: Secondary | ICD-10-CM | POA: Diagnosis not present

## 2023-07-07 DIAGNOSIS — D649 Anemia, unspecified: Secondary | ICD-10-CM | POA: Diagnosis not present

## 2023-07-07 DIAGNOSIS — K5900 Constipation, unspecified: Secondary | ICD-10-CM | POA: Diagnosis present

## 2023-07-07 DIAGNOSIS — R531 Weakness: Secondary | ICD-10-CM | POA: Diagnosis present

## 2023-07-07 LAB — HEPATIC FUNCTION PANEL
ALT: 441 U/L — ABNORMAL HIGH (ref 0–44)
AST: 595 U/L — ABNORMAL HIGH (ref 15–41)
Albumin: 1.6 g/dL — ABNORMAL LOW (ref 3.5–5.0)
Alkaline Phosphatase: 216 U/L — ABNORMAL HIGH (ref 38–126)
Bilirubin, Direct: 1.2 mg/dL — ABNORMAL HIGH (ref 0.0–0.2)
Indirect Bilirubin: 1.8 mg/dL — ABNORMAL HIGH (ref 0.3–0.9)
Total Bilirubin: 3 mg/dL — ABNORMAL HIGH (ref 0.0–1.2)
Total Protein: 5.4 g/dL — ABNORMAL LOW (ref 6.5–8.1)

## 2023-07-07 LAB — CBC WITH DIFFERENTIAL/PLATELET
Abs Immature Granulocytes: 0.11 10*3/uL — ABNORMAL HIGH (ref 0.00–0.07)
Basophils Absolute: 0.1 10*3/uL (ref 0.0–0.1)
Basophils Relative: 0 %
Eosinophils Absolute: 0 10*3/uL (ref 0.0–0.5)
Eosinophils Relative: 0 %
HCT: 41.2 % (ref 36.0–46.0)
Hemoglobin: 14.2 g/dL (ref 12.0–15.0)
Immature Granulocytes: 1 %
Lymphocytes Relative: 8 %
Lymphs Abs: 1.6 10*3/uL (ref 0.7–4.0)
MCH: 31.3 pg (ref 26.0–34.0)
MCHC: 34.5 g/dL (ref 30.0–36.0)
MCV: 90.9 fL (ref 80.0–100.0)
Monocytes Absolute: 0.3 10*3/uL (ref 0.1–1.0)
Monocytes Relative: 2 %
Neutro Abs: 17 10*3/uL — ABNORMAL HIGH (ref 1.7–7.7)
Neutrophils Relative %: 89 %
Platelets: 179 10*3/uL (ref 150–400)
RBC: 4.53 MIL/uL (ref 3.87–5.11)
RDW: 22.9 % — ABNORMAL HIGH (ref 11.5–15.5)
Smear Review: NORMAL
WBC: 19.1 10*3/uL — ABNORMAL HIGH (ref 4.0–10.5)
nRBC: 0 % (ref 0.0–0.2)

## 2023-07-07 LAB — BASIC METABOLIC PANEL WITH GFR
Anion gap: 7 (ref 5–15)
BUN: 38 mg/dL — ABNORMAL HIGH (ref 8–23)
CO2: 18 mmol/L — ABNORMAL LOW (ref 22–32)
Calcium: 8.3 mg/dL — ABNORMAL LOW (ref 8.9–10.3)
Chloride: 113 mmol/L — ABNORMAL HIGH (ref 98–111)
Creatinine, Ser: 1.64 mg/dL — ABNORMAL HIGH (ref 0.44–1.00)
GFR, Estimated: 32 mL/min — ABNORMAL LOW (ref >60)
Glucose, Bld: 145 mg/dL — ABNORMAL HIGH (ref 70–99)
Potassium: 3.3 mmol/L — ABNORMAL LOW (ref 3.5–5.1)
Sodium: 138 mmol/L (ref 135–145)

## 2023-07-07 LAB — GLUCOSE, CAPILLARY
Glucose-Capillary: 199 mg/dL — ABNORMAL HIGH (ref 70–99)
Glucose-Capillary: 188 mg/dL — ABNORMAL HIGH (ref 70–99)

## 2023-07-07 MED ORDER — GLUCERNA SHAKE PO LIQD
237.0000 mL | Freq: Two times a day (BID) | ORAL | Status: DC
  Administered 2023-07-07 – 2023-07-08 (×2): 237 mL via ORAL

## 2023-07-07 MED ORDER — PROCHLORPERAZINE EDISYLATE 10 MG/2ML IJ SOLN: 5.0000 mg | Freq: Four times a day (QID) | INTRAMUSCULAR | Status: DC | PRN

## 2023-07-07 MED ORDER — POTASSIUM CHLORIDE 20 MEQ PO PACK
20.0000 meq | PACK | Freq: Once | ORAL | Status: AC
  Administered 2023-07-07: 20 meq via ORAL
  Filled 2023-07-07: qty 1

## 2023-07-07 MED ORDER — BISACODYL 10 MG RE SUPP: 10.0000 mg | Freq: Every day | RECTAL | Status: DC | PRN

## 2023-07-07 MED ORDER — LATANOPROST 0.005 % OP SOLN
1.0000 [drp] | Freq: Every day | OPHTHALMIC | Status: DC
  Administered 2023-07-07 – 2023-07-17 (×11): 1 [drp] via OPHTHALMIC
  Filled 2023-07-07: qty 2.5

## 2023-07-07 MED ORDER — POLYETHYLENE GLYCOL 3350 17 G PO PACK: 17.0000 g | PACK | Freq: Every day | ORAL | 0 refills | Status: DC | PRN

## 2023-07-07 MED ORDER — GUAIFENESIN-DM 100-10 MG/5ML PO SYRP: 5.0000 mL | ORAL_SOLUTION | Freq: Four times a day (QID) | ORAL | Status: DC | PRN

## 2023-07-07 MED ORDER — POLYETHYLENE GLYCOL 3350 17 G PO PACK: 17.0000 g | PACK | Freq: Every day | ORAL | Status: DC | PRN

## 2023-07-07 MED ORDER — ORAL CARE MOUTH RINSE: 15.0000 mL | OROMUCOSAL | Status: DC | PRN

## 2023-07-07 MED ORDER — TRAZODONE HCL 50 MG PO TABS: 25.0000 mg | ORAL_TABLET | Freq: Every evening | ORAL | Status: DC | PRN

## 2023-07-07 MED ORDER — ASPIRIN 81 MG PO TBEC
81.0000 mg | DELAYED_RELEASE_TABLET | Freq: Every day | ORAL | Status: DC
  Administered 2023-07-08 – 2023-07-18 (×11): 81 mg via ORAL
  Filled 2023-07-07 (×11): qty 1

## 2023-07-07 MED ORDER — PROCHLORPERAZINE MALEATE 5 MG PO TABS: 5.0000 mg | ORAL_TABLET | Freq: Four times a day (QID) | ORAL | Status: DC | PRN

## 2023-07-07 MED ORDER — ALUM & MAG HYDROXIDE-SIMETH 200-200-20 MG/5ML PO SUSP: 30.0000 mL | ORAL | Status: DC | PRN

## 2023-07-07 MED ORDER — PREDNISONE 20 MG PO TABS: 40.0000 mg | ORAL_TABLET | ORAL | 0 refills | Status: DC

## 2023-07-07 MED ORDER — INSULIN ASPART 100 UNIT/ML IJ SOLN
0.0000 [IU] | Freq: Three times a day (TID) | INTRAMUSCULAR | Status: DC
  Administered 2023-07-07 – 2023-07-09 (×3): 2 [IU] via SUBCUTANEOUS
  Administered 2023-07-09: 1 [IU] via SUBCUTANEOUS
  Administered 2023-07-10 – 2023-07-11 (×2): 2 [IU] via SUBCUTANEOUS
  Administered 2023-07-12: 3 [IU] via SUBCUTANEOUS
  Administered 2023-07-13: 1 [IU] via SUBCUTANEOUS
  Administered 2023-07-14: 3 [IU] via SUBCUTANEOUS
  Administered 2023-07-15: 2 [IU] via SUBCUTANEOUS
  Administered 2023-07-16: 1 [IU] via SUBCUTANEOUS
  Administered 2023-07-16: 2 [IU] via SUBCUTANEOUS
  Administered 2023-07-16: 1 [IU] via SUBCUTANEOUS
  Administered 2023-07-17: 2 [IU] via SUBCUTANEOUS
  Administered 2023-07-18: 1 [IU] via SUBCUTANEOUS

## 2023-07-07 MED ORDER — FUROSEMIDE 40 MG PO TABS: 40.0000 mg | ORAL_TABLET | Freq: Two times a day (BID) | ORAL | 0 refills | Status: DC

## 2023-07-07 MED ORDER — DOCUSATE SODIUM 100 MG PO CAPS
100.0000 mg | ORAL_CAPSULE | Freq: Two times a day (BID) | ORAL | Status: DC
  Administered 2023-07-07 – 2023-07-18 (×22): 100 mg via ORAL
  Filled 2023-07-07 (×22): qty 1

## 2023-07-07 MED ORDER — FUROSEMIDE 40 MG PO TABS
40.0000 mg | ORAL_TABLET | Freq: Two times a day (BID) | ORAL | Status: DC
  Administered 2023-07-07 – 2023-07-12 (×10): 40 mg via ORAL
  Filled 2023-07-07 (×10): qty 1

## 2023-07-07 MED ORDER — MILK AND MOLASSES ENEMA: 1.0000 | Freq: Once | RECTAL | Status: DC | PRN

## 2023-07-07 MED ORDER — PREDNISONE 20 MG PO TABS
40.0000 mg | ORAL_TABLET | Freq: Every day | ORAL | Status: AC
  Administered 2023-07-08 – 2023-07-12 (×5): 40 mg via ORAL
  Filled 2023-07-07 (×5): qty 2

## 2023-07-07 MED ORDER — POTASSIUM CHLORIDE 20 MEQ PO PACK: 20.0000 meq | PACK | Freq: Every day | ORAL | Status: DC

## 2023-07-07 MED ORDER — SENNOSIDES-DOCUSATE SODIUM 8.6-50 MG PO TABS
1.0000 | ORAL_TABLET | Freq: Every day | ORAL | Status: DC
  Administered 2023-07-07 – 2023-07-17 (×11): 1 via ORAL
  Filled 2023-07-07 (×11): qty 1

## 2023-07-07 MED ORDER — VITAMIN C 500 MG PO TABS
1000.0000 mg | ORAL_TABLET | Freq: Every day | ORAL | Status: DC
  Administered 2023-07-07 – 2023-07-18 (×12): 1000 mg via ORAL
  Filled 2023-07-07 (×11): qty 2

## 2023-07-07 MED ORDER — DORZOLAMIDE HCL-TIMOLOL MAL 2-0.5 % OP SOLN
1.0000 [drp] | Freq: Two times a day (BID) | OPHTHALMIC | Status: DC
  Administered 2023-07-07 – 2023-07-17 (×21): 1 [drp] via OPHTHALMIC
  Filled 2023-07-07: qty 10

## 2023-07-07 MED ORDER — PROCHLORPERAZINE 25 MG RE SUPP: 12.5000 mg | Freq: Four times a day (QID) | RECTAL | Status: DC | PRN

## 2023-07-07 MED ORDER — DIPHENHYDRAMINE HCL 25 MG PO CAPS: 25.0000 mg | ORAL_CAPSULE | Freq: Four times a day (QID) | ORAL | Status: DC | PRN

## 2023-07-07 MED ORDER — POTASSIUM CHLORIDE CRYS ER 20 MEQ PO TBCR
20.0000 meq | EXTENDED_RELEASE_TABLET | Freq: Every day | ORAL | Status: DC
  Administered 2023-07-08 – 2023-07-12 (×5): 20 meq via ORAL
  Filled 2023-07-07 (×5): qty 1

## 2023-07-07 MED ORDER — ACETAMINOPHEN 325 MG PO TABS: 325.0000 mg | ORAL_TABLET | ORAL | Status: DC | PRN

## 2023-07-07 NOTE — Progress Notes (Signed)
 Inpatient Rehabilitation Center Individual Statement of Services  Patient Name:  KEIRRA ZEIMET  Date:  07/07/2023  Welcome to the Inpatient Rehabilitation Center.  Our goal is to provide you with an individualized program based on your diagnosis and situation, designed to meet your specific needs.  With this comprehensive rehabilitation program, you will be expected to participate in at least 3 hours of rehabilitation therapies Monday-Friday, with modified therapy programming on the weekends.  Your rehabilitation program will include the following services:  Physical Therapy (PT), Occupational Therapy (OT), 24 hour per day rehabilitation nursing, Therapeutic Recreaction (TR), Care Coordinator, Rehabilitation Medicine, Nutrition Services, and Pharmacy Services  Weekly team conferences will be held on Tuesday to discuss your progress.  Your Inpatient Rehabilitation Care Coordinator will talk with you frequently to get your input and to update you on team discussions.  Team conferences with you and your family in attendance may also be held.  Expected length of stay: 7-10 days   Overall anticipated outcome: Independent with device  Depending on your progress and recovery, your program may change. Your Inpatient Rehabilitation Care Coordinator will coordinate services and will keep you informed of any changes. Your Inpatient Rehabilitation Care Coordinator's name and contact numbers are listed  below.  The following services may also be recommended but are not provided by the Inpatient Rehabilitation Center:  Driving Evaluations Home Health Rehabiltiation Services Outpatient Rehabilitation Services    Arrangements will be made to provide these services after discharge if needed.  Arrangements include referral to agencies that provide these services.  Your insurance has been verified to be:  Medicare & AARP Your primary doctor is:  Catheryn Slocumb  Pertinent information will be shared with your  doctor and your insurance company.  Inpatient Rehabilitation Care Coordinator:  Rhoda Clement, KEN [phone] or ELIGAH BASQUES  Information discussed with and copy given to patient by: Clement Asberry MATSU, 07/07/2023, 2:10 PM

## 2023-07-07 NOTE — Progress Notes (Signed)
 Inpatient Rehabilitation Admission Medication Review by a Pharmacist  A complete drug regimen review was completed for this patient to identify any potential clinically significant medication issues.  High Risk Drug Classes Is patient taking? Indication by Medication  Antipsychotic Yes, as an intravenous medication Compazine- n/v   Anticoagulant No   Antibiotic No   Opioid No   Antiplatelet No   Hypoglycemics/insulin No   Vasoactive Medication Yes Furosemide - edema   Chemotherapy No   Other Yes bisacodyl  and - constipation Maalox- indigestion Diphenhydramine- itching  Acetaminophen - pain  Robitussin- cough   trazodone and -insomnia Kcl, VitC - supplement  Prednisone- autoimmune hepatitis  Cosopt /Xalatan - glaucoma  Docusate, bisacodyl, milk and molasses, PEG- constipation      Type of Medication Issue Identified Description of Issue Recommendation(s)  Drug Interaction(s) (clinically significant)     Duplicate Therapy     Allergy     No Medication Administration End Date  Prednisone ordered as taper at discharge- prednisone 40 started 7/2 with plan for 40 mg x 7 days, then taper down by 5 mg per week    Incorrect Dose     Additional Drug Therapy Needed     Significant med changes from prior encounter (inform family/care partners about these prior to discharge). PTA meds not resumed- amlodipine , atorvastatin , valsartan , bASA, finasteride, ezetimibe , calcium  carbonate    Communicate relevant medication changes to patient/family members at discharge from CIR.   Restart or discontinue PTA meds not resumed in CIR at discharge if clinically indicated.   Other       Clinically significant medication issues were identified that warrant physician communication and completion of prescribed/recommended actions by midnight of the next day:  No  Pharmacist comments:  Valsartan  resumed at discharge from acute care, however 6/28 nephrology note states to remain off of ARB given AKI  and with hypotension on admission - no nephrology notes since that time with only slight improvement in renal function. Baseline Scr appears to be <1 and currently Scr 1.64.   Confirming whether aspirin  may be resumed with GI.   May also be counterproductive to start Crestor and Zetia  given elevation of liver enzymes with new finding of autoimmune hepatitis and elevated/stable LFTs.   Time spent performing this drug regimen review (minutes):  30  Massie Fila, PharmD Clinical Pharmacist  07/07/2023 1:53 PM

## 2023-07-07 NOTE — Progress Notes (Signed)
 DISCHARGE NOTE [facility] to be discharged Rehab per MD order. Discussed prescriptions and follow up appointments with the patient. Prescriptions given to patient; medication list explained in detail. Patient verbalized understanding.  Skin clean, dry and intact without evidence of skin break down, no evidence of skin tears noted. IV catheter discontinued intact. Site without signs and symptoms of complications. Dressing and pressure applied. Pt denies pain at the site currently. No complaints noted.  Patient free of lines, drains, and wounds.   An After Visit Summary (AVS) was printed and given to the patient. Report called to CIR Patient escorted via wheelchair, and discharged to CIR  Doyal Sias, RN

## 2023-07-07 NOTE — TOC Transition Note (Signed)
 Transition of Care [facility]) - Discharge Note   Patient Details  Name: Marissa Orozco MRN: [id number] Date of Birth: 10/11/1945  [facility] CM/SW Contact:  Tom-Johnson, Harvest Muskrat, RN Phone Number: 07/07/2023, 10:39 AM   Clinical Narrative:     Patient is scheduled for discharge today to CIR. Patient will be transported as In-hospital transfer to CIR via bed.  No further TOC needs noted.           Final next level of care: IP Rehab Facility Barriers to Discharge: Barriers Resolved   Patient Goals and CMS Choice Patient states their goals for this hospitalization and ongoing recovery are:: To go to CIR and return home. CMS Medicare.gov Compare Post Acute Care list provided to:: Patient Choice offered to / list presented to : Patient, Adult Children (Daughter [name])      Discharge Placement                Patient to be transferred to facility by: In-hospital transfer to CIR via bed      Discharge Plan and Services Additional resources added to the After Visit Summary for                  DME Arranged: N/A DME Agency: NA       HH Arranged: NA HH Agency: NA        Social Drivers of Health (SDOH) Interventions SDOH Screenings   Food Insecurity: No Food Insecurity (06/17/2023)  Housing: Low Risk  (06/17/2023)  Transportation Needs: No Transportation Needs (06/17/2023)  Utilities: Not At Risk (06/17/2023)  Alcohol Screen: Low Risk  (02/12/2023)  Depression (PHQ2-9): Low Risk  (06/02/2023)  Financial Resource Strain: Low Risk  (02/12/2023)  Physical Activity: Sufficiently Active (02/12/2023)  Social Connections: Moderately Isolated (06/17/2023)  Stress: No Stress Concern Present (02/12/2023)  Tobacco Use: Low Risk  (07/04/2023)  Health Literacy: Adequate Health Literacy (09/15/2022)     Readmission Risk Interventions    06/24/2023    1:10 PM  Readmission Risk Prevention Plan  Transportation Screening Complete  PCP or Specialist Appt within 5-7  Days Complete  Home Care Screening Complete  Medication Review (RN CM) Referral to Pharmacy

## 2023-07-07 NOTE — Progress Notes (Signed)
 Cornelio Bouchard, MD  Physician Physical Medicine and Rehabilitation   PMR Pre-admission    Signed   Date of Service: 06/30/2023 11:48 AM  Related encounter: ED to Hosp-Admission (Discharged) from 06/17/2023 in [facility]   Signed     Expand All Collapse All  Show:Clear all [x] Written[x] Templated[] Copied  Added by: [x] Rafiq Bucklin, Heron MATSU, RN[x] Lovorn, Megan, MD[x] Warren, Caitlin E, PT  [] Hover for details PMR Admission Coordinator Pre-Admission Assessment   Patient: Marissa Orozco is an 78 y.o., female MRN: [id number] DOB: March 23, 1945 Height: 5' 3.5 (161.3 cm) Weight: 80.5 kg   Insurance Information HMO:     PPO:      PCP:      IPA:      80/20:      OTHER:  PRIMARY: Medicare a and b      Policy#: [id number]      Subscriber: pt Benefits:  Phone #: passport one source online     Name: 6/26 Eff. Date: 01/04/2009     Deduct: $1676      Out of Pocket Max: none      Life Max: none CIR: 100%      SNF: 20 full days Outpatient: 80%     Co-Pay: 20% Home Health: 100%      Co-Pay:  DME: 80%     Co-Pay: 20% Providers: pt choice    Secondary: AARP supplement      Policy#: [id number]     Phone#:    Financial Counselor:       Phone#:    The "Data Collection Information Summary" for patients in Inpatient Rehabilitation Facilities with attached "Privacy Act Statement-Health Care Records" was provided and verbally reviewed with: Patient   Emergency Contact Information Contact Information       Name Relation Home Work Mobile    Harold Daughter     [phone]    Geovana, Gebel [phone]   [phone]         Other Contacts   None on File      Current Medical History  Patient Admitting Diagnosis: Debility, AKI   History of Present Illness: 78 yo female with history of CVA, HTN, HLD, kidney stones who presented on 06/17/23 to [facility] ED with complaints of low BP and fatigue.  Admitted to [facility] on 06/17[facility]23.   Workup revealed AKI.  Treated with IVF and renal consulted.  Found to have e coli UTI and treated with ceftriaxone .  Transferred to [facility] on 06/23/23 for renal biopsy.   Right IJ TDC placed on 6/21 due to rising BUN/CR. Received one round of HD on 6/21. Bx 6/23 which revealed chronic changes, AIN and some TMA, no immune complex medicated or crescentic GN. On 6/26 UOP and Scr improved and dialysis catheter removed.    RUQ US  with fatty liver disease  and noted abnormal LFTS. Was on amlodipine  and atorvastatin  pta. Atorvastin stopped. Off of acetaminophen . GI consulted. Noted tranaminitis and bilirubinemia. Noted to have a 5.2 cm x 4.6 cm pancreatic cyst. Liver Bx 07/04/23.  Results felt most consistent with autoimmune hepatitis without cirrhosis. Began on Prednisone taper. Recommend close monitoring of liver enzymes and trend IgG. Outpatient referral to hepatology . Will need EUS to evaluate the bile duct and pancreatic cyst.    HTN and to remain off ARB in setting of AKI. Noted hypotension pta. Lasix  added to increase UOP. Hgb A1c 6.2, optimizing control to reduce risk of CKD progression.    Patient's medical record  from [facility] and [facility] has been reviewed by the rehabilitation admission coordinator and physician.   Past Medical History      Past Medical History:  Diagnosis Date   Allergy 2000   Breast lump     Cataract     Colon polyps     Fibroids     GERD (gastroesophageal reflux disease)     Glaucoma     Heart murmur     Hyperlipidemia 01/05/1984   Hypertension 01/05/1984   Interstitial cystitis 01/04/1997   Kidney stones 01/05/2003   Neuromuscular disorder (HCC)     Osteoporosis     Preop cardiovascular exam 10/25/2022   Sleep apnea 2021   Tonsillitis          Has the patient had major surgery during 100 days prior to admission? Yes   Family History   family history includes Arthritis in her mother; Cancer in her father and mother; Colon cancer in her maternal grandmother;  Colon cancer (age of onset: 43) in her father; Colon cancer (age of onset: 59) in her mother; Coronary artery disease in her father; Diabetes in an other family member; Heart disease in her mother; Hypertension in her father and mother; Stroke in her father.   Current Medications  Current Medications    Current Facility-Administered Medications:    docusate sodium  (COLACE) capsule 100 mg, 100 mg, Oral, BID, Rai, Ripudeep K, MD, 100 mg at 07/07/23 9266   dorzolamide -timolol  (COSOPT ) 2-0.5 % ophthalmic solution 1 drop, 1 drop, Both Eyes, BID, Rai, Ripudeep K, MD, 1 drop at 07/07/23 9266   furosemide  (LASIX ) tablet 40 mg, 40 mg, Oral, BID, Coladonato, Joseph, MD, 40 mg at 07/07/23 9266   latanoprost  (XALATAN ) 0.005 % ophthalmic solution 1 drop, 1 drop, Both Eyes, QHS, Rai, Ripudeep K, MD, 1 drop at 07/06/23 2112   ondansetron  (ZOFRAN ) injection 4 mg, 4 mg, Intravenous, Q6H PRN, Rai, Ripudeep K, MD   Oral care mouth rinse, 15 mL, Mouth Rinse, PRN, Odell Celinda Balo, MD   polyethylene glycol (MIRALAX  / GLYCOLAX ) packet 17 g, 17 g, Oral, Daily PRN, Rai, Ripudeep K, MD, 17 g at 07/05/23 1213   predniSONE (DELTASONE) tablet 40 mg, 40 mg, Oral, Daily, Armbruster, Elspeth SQUIBB, MD, 40 mg at 07/07/23 9266     Patients Current Diet:  Diet Order                  Diet renal with fluid restriction Fluid restriction: 1200 mL Fluid; Room service appropriate? Yes; Fluid consistency: Thin  Diet effective now                       Precautions / Restrictions Precautions Precautions: Fall Restrictions Weight Bearing Restrictions Per Provider Order: No    Has the patient had 2 or more falls or a fall with injury in the past year? No   Prior Activity Level Community (5-7x/wk): independent without AD, retired, drives, does Aerobics 3 times per week   Prior Functional Level Self Care: Did the patient need help bathing, dressing, using the toilet or eating? Independent   Indoor Mobility: Did the  patient need assistance with walking from room to room (with or without device)? Independent   Stairs: Did the patient need assistance with internal or external stairs (with or without device)? Independent   Functional Cognition: Did the patient need help planning regular tasks such as shopping or remembering to take medications? Independent   Patient Information Are you  of Hispanic, Latino/a,or Spanish origin?: A. No, not of Hispanic, Latino/a, or Spanish origin What is your race?: B. Black or African American Do you need or want an interpreter to communicate with a doctor or health care staff?: 0. No   Patient's Response To:  Health Literacy and Transportation Is the patient able to respond to health literacy and transportation needs?: Yes Health Literacy - How often do you need to have someone help you when you read instructions, pamphlets, or other written material from your doctor or pharmacy?: Never In the past 12 months, has lack of transportation kept you from medical appointments or from getting medications?: No In the past 12 months, has lack of transportation kept you from meetings, work, or from getting things needed for daily living?: No   Home Assistive Devices / Equipment Home Equipment: None   Prior Device Use: Indicate devices/aids used by the patient prior to current illness, exacerbation or injury? None of the above   Current Functional Level Cognition   Orientation Level: Oriented X4    Extremity Assessment (includes Sensation/Coordination)   Upper Extremity Assessment: Generalized weakness  Lower Extremity Assessment: Defer to PT evaluation     ADLs   Overall ADL's : Needs assistance/impaired Eating/Feeding: Independent, Sitting Grooming: Wash/dry hands, Wash/dry face, Set up, Supervision/safety, Sitting Upper Body Bathing: Contact guard assist, Sitting Lower Body Bathing: Maximal assistance, Sitting/lateral leans Upper Body Dressing : Set up,  Supervision/safety, Sitting Lower Body Dressing: Total assistance, Sit to/from stand Toilet Transfer: Minimal assistance, Rolling walker (2 wheels), Cueing for safety, BSC/3in1, Ambulation Toileting- Clothing Manipulation and Hygiene: Minimal assistance, Sit to/from stand Functional mobility during ADLs: Minimal assistance, Rolling walker (2 wheels), Cueing for safety     Mobility   Overal bed mobility: Needs Assistance Bed Mobility: Supine to Sit Supine to sit: Min assist Sit to supine: Mod assist General bed mobility comments: Pt able to sit up trunk with HOB elevated, minA to scoot R hip out to edge of bed     Transfers   Overall transfer level: Needs assistance Equipment used: Rolling walker (2 wheels) Transfers: Sit to/from Stand, Bed to chair/wheelchair/BSC Sit to Stand: Min assist Bed to/from chair/wheelchair/BSC transfer type:: Step pivot Stand pivot transfers: Min assist Step pivot transfers: Contact guard assist General transfer comment: Rocking forward for momentum, cues for nose over toes, minA to rise     Ambulation / Gait / Stairs / Wheelchair Mobility   Ambulation/Gait General Gait Details: small side steps along EOB for hip exercises (side stepping for gluteus medius activation) cues to increase step width for great activation. 2x5 reps bil.     Posture / Balance Dynamic Sitting Balance Sitting balance - Comments: statically Balance Overall balance assessment: Needs assistance Sitting-balance support: Feet supported, Bilateral upper extremity supported Sitting balance-Leahy Scale: Fair Sitting balance - Comments: statically Standing balance support: Bilateral upper extremity supported, During functional activity, Reliant on assistive device for balance Standing balance-Leahy Scale: Poor Standing balance comment: min assist and B UEs on RW     Special needs/care consideration Hgb A1c 6.2    Previous Home Environment  Living Arrangements: Spouse/significant  other Available Help at Discharge: Family, Available 24 hours/day Type of Home: House Home Layout: Two level Alternate Level Stairs-Rails: Can reach both Alternate Level Stairs-Number of Steps: flight Home Access: Stairs to enter Entrance Stairs-Rails: Can reach both Entrance Stairs-Number of Steps: 4 Bathroom Shower/Tub: Health visitor: Standard Home Care Services: No   Discharge Living Setting Plans for Discharge Living  Setting: Patient's home, Lives with (comment) (spouse) Type of Home at Discharge: House Discharge Home Layout: Two level Alternate Level Stairs-Rails: Can reach both Alternate Level Stairs-Number of Steps: flight Discharge Home Access: Stairs to enter Entrance Stairs-Rails: Can reach both Entrance Stairs-Number of Steps: 4 Discharge Bathroom Shower/Tub: Walk-in shower Discharge Bathroom Toilet: Standard Discharge Bathroom Accessibility: Yes How Accessible: Accessible via walker Does the patient have any problems obtaining your medications?: No   Social/Family/Support Systems Patient Roles: Spouse Contact Information: spouse Anticipated Caregiver: spouse Anticipated Caregiver's Contact Information: see contacts Ability/Limitations of Caregiver: no limitations Caregiver Availability: 24/7 Discharge Plan Discussed with Primary Caregiver: Yes Is Caregiver In Agreement with Plan?: Yes Does Caregiver/Family have Issues with Lodging/Transportation while Pt is in Rehab?: No   Goals Patient/Family Goal for Rehab: Mod I to supervision with PT and OT Expected length of stay: ELOS 7 to 10 days Pt/Family Agrees to Admission and willing to participate: Yes Program Orientation Provided & Reviewed with Pt/Caregiver Including Roles  & Responsibilities: Yes   Decrease burden of Care through IP rehab admission: n/a   Possible need for SNF placement upon discharge: not anticipated   Patient Condition: I have reviewed medical records from [facility] and [facility], spoken with  patient. I discussed via phone for inpatient rehabilitation assessment.  Patient will benefit from ongoing PT and OT, can actively participate in 3 hours of therapy a day 5 days of the week, and can make measurable gains during the admission.  Patient will also benefit from the coordinated team approach during an Inpatient Acute Rehabilitation admission.  The patient will receive intensive therapy as well as Rehabilitation physician, nursing, social worker, and care management interventions.  Due to bladder management, bowel management, safety, skin/wound care, disease management, medication administration, pain management, and patient education the patient requires 24 hour a day rehabilitation nursing.  The patient is currently mod assist with mobility and basic ADLs.  Discharge setting and therapy post discharge at home with home health is anticipated.  Patient has agreed to participate in the Acute Inpatient Rehabilitation Program and will admit Sunday, 6/29.   Preadmission Screen Completed By:  Alison Heron Lot, RN MSN 07/07/2023 10:20 AM ______________________________________________________________________   Discussed status with Dr. Lovorn on 07/07/23  at 1019 and received approval for admission today.   Admission Coordinator:  Altheria, Shadoan, RN MSN time 10:20 AM Pattricia 07/07/23     Assessment/Plan: Diagnosis: Debility due to ARF/autoimmune hepatitis Does the need for close, 24 hr/day Medical supervision in concert with the patient's rehab needs make it unreasonable for this patient to be served in a less intensive setting? Yes Co-Morbidities requiring supervision/potential complications: CVA, HTN, HLD; prediabetes A1c 6.2; CA 19-9 139; on high dose prednisone.  Due to bladder management, bowel management, safety, skin/wound care, disease management, medication administration, pain management, and patient education, does the patient require 24 hr/day rehab  nursing? Yes Does the patient require coordinated care of a physician, rehab nurse, PT, OT, and SLP to address physical and functional deficits in the context of the above medical diagnosis(es)? Yes Addressing deficits in the following areas: balance, endurance, locomotion, strength, transferring, bowel/bladder control, bathing, dressing, feeding, grooming, and toileting Can the patient actively participate in an intensive therapy program of at least 3 hrs of therapy 5 days a week? Yes The potential for patient to make measurable gains while on inpatient rehab is good Anticipated functional outcomes upon discharge from inpatient rehab: modified independent and supervision PT, modified independent and supervision  OT, n/a SLP Estimated rehab length of stay to reach the above functional goals is: 7-10 days Anticipated discharge destination: Home 10. Overall Rehab/Functional Prognosis: good     MD Signature:            Revision History

## 2023-07-07 NOTE — H&P (Signed)
 Physical Medicine and Rehabilitation Admission H&P    Chief Complaint  Patient presents with   Functional deficits due to Myopathy  : HPI: Marissa Orozco is a 78 year old L handed female with history of CVA, HTN, HLD, glaucoma, and hx of kidney stones presented to [facility] on 06/17/2023 with complaints of low blood pressures, persistent fatigue, and poor intake for several days otherwise no other symptoms. Per her daughter, she was working out doing yoga/pilates days prior to admission  Admission chemistries BUN 30 and cr 2.79.  UTI positive for E. coli treated with ABX.  She was admitted to [facility] on 06/17/23 for AKI and treated with IVF.  Nephrology consulted and she was transferred to [facility] on 06/23/2023 d/t to concerns for glomerulonephritis and rising BUN/CR. IR consulted placing a right IJ TDC on 06/25/23. She received 1 round of HD.  No BX revealed chronic changes, AIN and some TMA but no immune complex mediated or crescentic glomerulonephritis noted.  On 6/26 UOP and Scr improved and dialysis catheter removed 07/04/23.   GI consulted due to  significantly elevated LFTs. US  of RUQ showed fatty liver w/o focal abnormality. There were incidental findings of tranaminitis/bilirubinemia and a pancreatic cyst measuring 5.2 cm x 4.6 cm and CA 19-9 was elevated to 139. 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 Recommendations to start prednisone taper (started 07/06/23) close monitoring of LFTs, trend IgG and an outpatient referral to hepatology for EUS.  TMPT results are still pending. Prior to arrival patient was on amlodipine  and atorvastatin  for hypertension but remains off of ARB in the setting of AKI and noted hypotension.   Per chart review patient was independent without need for AD, retired and drives.  Participates in aerobics 3 times weekly and lives in two level home.  Currently requiring  min-mod assist with mobility.  Min to max assist with ADLs and minimal assistance using RW. Therapy evaluations completed due to patient decreased functional mobility was admitted for a comprehensive rehab program.     Pt reports her main symptoms are severe weakness, mainly in proximal arms and legs are even worse.   Tired of blood draws.  No N/V- LBM yesterday.  More LE swelling. And notes that Ensure makes her vomit.   Review of Systems  Constitutional: Negative.   HENT: Negative.    Eyes: Negative.   Respiratory: Negative.  Negative for cough, hemoptysis, sputum production and shortness of breath.   Cardiovascular: Negative.  Negative for chest pain and leg swelling.  Gastrointestinal:  Negative for abdominal pain, constipation, diarrhea, nausea and vomiting.  Genitourinary: Negative.  Negative for dysuria and hematuria.  Musculoskeletal: Negative.  Negative for back pain, joint pain and myalgias.       Bilat LE weakness denies any pain   Skin: Negative.   Neurological:  Positive for dizziness and weakness.       Bilat LE weakness denies any pain   Endo/Heme/Allergies: Negative.   Psychiatric/Behavioral: Negative.    All other systems reviewed and are negative.       Past Medical History:  Diagnosis Date   Allergy 2000   Breast lump    Cataract    Colon polyps    Fibroids    GERD (gastroesophageal reflux disease)    Glaucoma    Heart murmur    Hyperlipidemia 01/05/1984   Hypertension 01/05/1984   Interstitial cystitis 01/04/1997   Kidney stones 01/05/2003  Neuromuscular disorder (HCC)    Osteoporosis    Preop cardiovascular exam 10/25/2022   Sleep apnea 2021   Tonsillitis    Past Surgical History:  Procedure Laterality Date   ABDOMINAL HYSTERECTOMY  1997   BREAST EXCISIONAL BIOPSY Right 07/27/2006   Intraductal Papilloma   BREAST SURGERY  2008   CATARACT EXTRACTION Bilateral 02/2020   other in 05/2020   COLONOSCOPY  2010   Dr Kristie   EYE SURGERY  2022    HYSTEROSCOPY  1993   IR FLUORO GUIDE CV LINE RIGHT  06/24/2023   IR REMOVAL TUN CV CATH W/O FL  07/04/2023   IR US  GUIDE VASC ACCESS RIGHT  06/24/2023   LAPAROSCOPIC HYSTERECTOMY  1994   MYOMECTOMY  1993   excision of tumor   POLYPECTOMY     TONSILLECTOMY  1953   TUBAL LIGATION  1980   Family History  Problem Relation Age of Onset   Hypertension Mother    Heart disease Mother    Colon cancer Mother 56   Arthritis Mother    Cancer Mother    Coronary artery disease Father        mother, PGM   Stroke Father    Hypertension Father    Colon cancer Father 50   Cancer Father    Colon cancer Maternal Grandmother    Diabetes Other    Breast cancer Neg Hx    Colon polyps Neg Hx    Esophageal cancer Neg Hx    Stomach cancer Neg Hx    Rectal cancer Neg Hx    BRCA 1/2 Neg Hx    Social History:  reports that she has never smoked. She has never used smokeless tobacco. She reports current alcohol use of about 5.0 standard drinks of alcohol per week. She reports that she does not use drugs. Allergies:  Allergies  Allergen Reactions   Hydrochlorothiazide Itching   Medications Prior to Admission  Medication Sig Dispense Refill   amLODipine  (NORVASC ) 5 MG tablet TAKE 1 TABLET BY MOUTH DAILY (Patient taking differently: Take 5 mg by mouth at bedtime.) 90 tablet 3   aspirin  EC 81 MG tablet Take 81 mg by mouth daily. Swallow whole.     atorvastatin  (LIPITOR) 80 MG tablet TAKE 1 TABLET BY MOUTH DAILY 90 tablet 3   Calcium  Carbonate (CALCIUM  500 PO) Take 500 mg by mouth daily with breakfast.     Cholecalciferol (VITAMIN D3) 50 MCG (2000 UT) TABS Take 2,000 Units by mouth daily with breakfast.     dorzolamide -timolol  (COSOPT ) 22.3-6.8 MG/ML ophthalmic solution Place 1 drop into both eyes in the morning and at bedtime.     ezetimibe  (ZETIA ) 10 MG tablet Take 1 tablet (10 mg total) by mouth daily. 90 tablet 3   finasteride (PROSCAR) 5 MG tablet Take 5 mg by mouth daily.     latanoprost  (XALATAN )  0.005 % ophthalmic solution Place 1 drop into both eyes at bedtime.     valsartan  (DIOVAN ) 160 MG tablet Take 1 tablet (160 mg total) by mouth daily. 90 tablet 3      Home: Home Living Family/patient expects to be discharged to:: Private residence Living Arrangements: Spouse/significant other Available Help at Discharge: Family, Available 24 hours/day Type of Home: House Home Access: Stairs to enter [employer] of Steps: 4 Entrance Stairs-Rails: Can reach both Home Layout: Two level Alternate Level Stairs-Number of Steps: flight Alternate Level Stairs-Rails: Can reach both Bathroom Shower/Tub: Health visitor: Standard Home Equipment: None  Functional History: Prior Function Prior Level of Function : Independent/Modified Independent, Driving Mobility Comments: no AD, does aerobics at the [facility] ADLs Comments: indep  Functional Status:  Mobility: Bed Mobility Overal bed mobility: Needs Assistance Bed Mobility: Supine to Sit Supine to sit: Min assist Sit to supine: Mod assist General bed mobility comments: Pt able to sit up trunk with HOB elevated, minA to scoot R hip out to edge of bed Transfers Overall transfer level: Needs assistance Equipment used: Rolling walker (2 wheels) Transfers: Sit to/from Stand, Bed to chair/wheelchair/BSC Sit to Stand: Min assist Bed to/from chair/wheelchair/BSC transfer type:: Step pivot Stand pivot transfers: Min assist Step pivot transfers: Contact guard assist General transfer comment: Rocking forward for momentum, cues for nose over toes, minA to rise Ambulation/Gait General Gait Details: small side steps along EOB for hip exercises (side stepping for gluteus medius activation) cues to increase step width for great activation. 2x5 reps bil.    ADL: ADL Overall ADL's : Needs assistance/impaired Eating/Feeding: Independent, Sitting Grooming: Wash/dry hands, Wash/dry face, Set up, Supervision/safety,  Sitting Upper Body Bathing: Contact guard assist, Sitting Lower Body Bathing: Maximal assistance, Sitting/lateral leans Upper Body Dressing : Set up, Supervision/safety, Sitting Lower Body Dressing: Total assistance, Sit to/from stand Toilet Transfer: Minimal assistance, Rolling walker (2 wheels), Cueing for safety, BSC/3in1, Ambulation Toileting- Clothing Manipulation and Hygiene: Minimal assistance, Sit to/from stand Functional mobility during ADLs: Minimal assistance, Rolling walker (2 wheels), Cueing for safety  Cognition: Cognition Orientation Level: Oriented X4 Cognition Arousal: Alert Behavior During Therapy: WFL for tasks assessed/performed  Physical Exam: Blood pressure (!) 123/53, pulse 66, temperature 97.9 F (36.6 C), temperature source Oral, resp. rate 18, height 5' 3.5 (1.613 m), weight 80.5 kg, SpO2 100%.   Physical Exam Vitals and nursing note reviewed. Exam conducted with a chaperone present.  Constitutional:      General: She is not in acute distress.    Appearance: Normal appearance. She is obese.     Comments: Pt awake, alert, appropriate, appears very fatigued and slightly delayed responses- eldest daughter at bedside, NAD  HENT:     Head: Normocephalic and atraumatic.     Right Ear: External ear normal.     Left Ear: External ear normal.     Mouth/Throat:     Mouth: Mucous membranes are moist.     Pharynx: Oropharynx is clear. No oropharyngeal exudate.  Eyes:     General:        Right eye: No discharge.        Left eye: No discharge.     Extraocular Movements: Extraocular movements intact.  Cardiovascular:     Rate and Rhythm: Normal rate and regular rhythm.     Pulses: Normal pulses.     Heart sounds: Normal heart sounds. No murmur heard. Pulmonary:     Effort: Pulmonary effort is normal. No respiratory distress.     Breath sounds: Normal breath sounds. No wheezing, rhonchi or rales.  Abdominal:     General: Bowel sounds are normal. There is no  distension.     Palpations: Abdomen is soft.     Tenderness: There is no abdominal tenderness.     Comments: LBM 7/2  Musculoskeletal:     Cervical back: Normal range of motion and neck supple.     Comments: Ue's- Deltoids 4-/5 otherwise 5-/5 throughout in Ue's LE's- RLE- HF 2+/5; 4+/5 otherwise LLE- HF 2-/5 KE/KF/DF/PF all 4+/5 otherwise   Skin:    General: Skin is warm and dry.  Capillary Refill: Capillary refill takes less than 2 seconds.     Coloration: Skin is pale.     Comments: No skin breakdown seen  Neurological:     Mental Status: She is alert.     Sensory: No sensory deficit.     Comments: Intact to light touch in all 4 extremities Voice OK, but sounds somewhat weak Ox3 and appropriate- but slightly delayed responses   Psychiatric:     Comments: Very flat- hard to get her engage with examiner        Results for orders placed or performed during the hospital encounter of 07/03/23 (from the past 48 hours)  Comprehensive metabolic panel with GFR     Status: Abnormal   Collection Time: 07/06/23  5:35 AM  Result Value Ref Range   Sodium 140 135 - 145 mmol/L   Potassium 3.6 3.5 - 5.1 mmol/L   Chloride 112 (H) 98 - 111 mmol/L   CO2 21 (L) 22 - 32 mmol/L   Glucose, Bld 123 (H) 70 - 99 mg/dL    Comment: Glucose reference range applies only to samples taken after fasting for at least 8 hours.   BUN 39 (H) 8 - 23 mg/dL   Creatinine, Ser 8.48 (H) 0.44 - 1.00 mg/dL   Calcium  8.1 (L) 8.9 - 10.3 mg/dL   Total Protein 5.3 (L) 6.5 - 8.1 g/dL   Albumin 1.6 (L) 3.5 - 5.0 g/dL   AST 299 (H) 15 - 41 U/L   ALT 461 (H) 0 - 44 U/L   Alkaline Phosphatase 226 (H) 38 - 126 U/L   Total Bilirubin 3.5 (H) 0.0 - 1.2 mg/dL   GFR, Estimated 35 (L) >60 mL/min    Comment: (NOTE) Calculated using the CKD-EPI Creatinine Equation (2021)    Anion gap 7 5 - 15    Comment: Performed at [facility], [address], [place], [place] [address]  Magnesium      Status: None   Collection  Time: 07/06/23  5:35 AM  Result Value Ref Range   Magnesium  1.8 1.7 - 2.4 mg/dL    Comment: Performed at [facility], [address], [place], [place] [address]  Phosphorus     Status: None   Collection Time: 07/06/23  5:35 AM  Result Value Ref Range   Phosphorus 3.7 2.5 - 4.6 mg/dL    Comment: Performed at [facility], [address], [place], [place] [address]  CBC with Differential/Platelet     Status: Abnormal   Collection Time: 07/06/23  5:35 AM  Result Value Ref Range   WBC 10.7 (H) 4.0 - 10.5 K/uL   RBC 4.31 3.87 - 5.11 MIL/uL   Hemoglobin 13.5 12.0 - 15.0 g/dL   HCT 58.5 63.9 - 53.9 %   MCV 96.1 80.0 - 100.0 fL   MCH 31.3 26.0 - 34.0 pg   MCHC 32.6 30.0 - 36.0 g/dL   RDW 76.4 (H) 88.4 - 84.4 %   Platelets 107 (L) 150 - 400 K/uL    Comment: REPEATED TO VERIFY   nRBC 0.0 0.0 - 0.2 %   Neutrophils Relative % 65 %   Neutro Abs 7.0 1.7 - 7.7 K/uL   Lymphocytes Relative 20 %   Lymphs Abs 2.1 0.7 - 4.0 K/uL   Monocytes Relative 11 %   Monocytes Absolute 1.1 (H) 0.1 - 1.0 K/uL   Eosinophils Relative 2 %   Eosinophils Absolute 0.2 0.0 - 0.5 K/uL  Basophils Relative 1 %   Basophils Absolute 0.1 0.0 - 0.1 K/uL   WBC Morphology MORPHOLOGY UNREMARKABLE    RBC Morphology MORPHOLOGY UNREMARKABLE    Smear Review Normal platelet morphology    Immature Granulocytes 1 %   Abs Immature Granulocytes 0.07 0.00 - 0.07 K/uL    Comment: Performed at [facility], [address], [place], [place] [address]   No results found.    Blood pressure (!) 123/53, pulse 66, temperature 97.9 F (36.6 C), temperature source Oral, resp. rate 18, height 5' 3.5 (1.613 m), weight 80.5 kg, SpO2 100%.  Medical Problem List and Plan: 1. 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  Antithrombotics: -DVT/anticoagulation:  Mechanical: Sequential  compression devices, below knee Bilateral lower extremities  -antiplatelet therapy: resume ASA 81 mg 7/4 3. Pain Management: No meds ordered- pt denies pain- cannot have tylenol  or NSAIDs due to LFT elevation/autoimmune hepatitis  4. Mood/Behavior/Sleep: LCSW to follow for evaluation and support when available.   -antipsychotic agents: N/A 5. Neuropsych/cognition: This patient is capable of making decisions on her own behalf. 6. Skin/Wound Care: Routine pressure relief measures  7. Fluids/Electrolytes/Nutrition: CMET in a.m   -Daily weights- Strict I&O   -Renal w/ 1200 fluid restrict   -Severe protien calorie malnutrition: add Glucerna - Alb 1.6- concern that it's due to her hepatitis- will monitor -recheck next Monday  -Vitamin C   -Constipation- LBM 7/2 reports hard pebble like stools-will add Senna-S 1 tab/day 8. Hypertension: holding home amlodipine   -Continue Lasix  40 mg P.O twice daily   - CTM BPs closely 9. Hypokalemia: K+ now 3.3 Goal K+ > 4.0 and Mag > 2.0.  -continue potassium 20 meq daily   -will recheck levels in AM 10. Transaminitis/Elevated Liver Enzyme due to Autoimmune hepatitis/pancreatic cyst/dilated bile duct: Monitor Hepatic panel + CBC w/diff -Repeat LFT's on 7/7 and 7/10 per GI  -Prednisone 40 mg started 07/06/23- Taper by 5 mg/week until done.   TMPT-pending may need to start Thiopurine if normal  -will need ASAP f/u with GI and hepatology after d/c- GI will attempt to work up pancreatic cyst and bile duct dilation while here.  11. Hyperlipidemia: continue holding atorvastatin  due to abnormal LFTs. 12. Glaucoma: Continue Dorzolamide -Timolol  and Latanoprost   13. Pre-Diabetes: A1c 6.2. now on prednisone   -monitor BS ac/hs and use SSI for elevated BS due to high doses of Prednisone 14.  Class I obesity: Counsel on diet modifications and exercise to facilitate weight loss. 15. Leukocytosis- just started Prednisone today- I'm not sure would cause WBC to go to 19k from  10.9k so fast- will recheck in AM and monitor clinically for possible infection 16. Thrombocytopenia- Plts dropped to 107k today from 155k- will recheck in AM- if better, will check Monday/Thursdays- to reduce lab draws    Daphne LITTIE Finders, NP 07/07/2023  I have personally performed a face to face diagnostic evaluation of this patient and formulated the key components of the plan.  Additionally, I have personally reviewed laboratory data, imaging studies, as well as relevant notes and concur with the physician assistant's documentation above.   The patient's status has not changed from the original H&P.  Any changes in documentation from the acute care chart have been noted above.

## 2023-07-07 NOTE — Discharge Summary (Signed)
 Physician Discharge Summary   Patient: Marissa Orozco MRN: [id number] DOB: 1945/05/16  Admit date:     06/17/2023  Discharge date: 07/07/23  Discharge Physician: Deliliah Room   PCP: Jarold Medici, MD   Recommendations at discharge:    Follow up with PCP in one week Follow up with GI. Call to make an appointment. Continue meds as prescribed.  Discharge Diagnoses: Principal Problem:   AKI (acute kidney injury) (HCC) Active Problems:   HTN (hypertension), malignant   Hyperlipidemia with target low density lipoprotein (LDL) cholesterol less than 70 mg/dL   History of stroke   Elevated liver enzymes   Abnormal magnetic resonance cholangiopancreatography (MRCP)   Pancreatic cyst   Hospital Course:  78 y.o. female past medical history of CVA, essential hypertension hyperlipidemia history of kidney stones coming to the ED complaining of fatigue was found to have low blood pressure, she relates significant poor intake of less than the days, called her PCP to told her to come to the ED. Was found to be in acute kidney injury and underwent extensive renal workup. Had to have a dialysis catheter placed and dialyzed on 06/25/2023. Concern for glomerulonephritis however per Nephrology, renal biopsy done and revealed chronic changes, AIN and some TMA but no immune complex mediated or crescentic glomerular nephritis noted.  Status post liver biopsy done on 07/04/2023 and results are consistent with autoimmune hepatitis.  Acute tubular necrosis, proteinuria, POA: Resolved.  Patient had hemodialysis catheter placed and underwent hemodialysis.  Off of hemodialysis now and kidney function stable.  Nephrology has signed off.  Status post renal biopsy done on 06/27/2023 by IR but the biopsy revealed chronic changes, acute interstitial nephritis and some TMA no immune complex mediated increase enteric glomerulonephritis.   Essential hypertension: As needed intravenous hydralazine for systolic blood  pressure greater than 160.  Continue with oral Lasix  40 mg p.o. twice daily.  Continue to monitor blood pressure closely   Transaminitis/elevated liver enzymes in the setting of autoimmune hepatitis, POA: GI on board.  Status post MRCP which showed some fusiform dilatation of the common bile duct to 1 cm, some intrahepatic regurgitation on the left side as well as pancreatic cyst.  Patient will need outpatient EUS and GI follow-up on discharge. Status post liver biopsy, done on 07/04/2023,showing autoimmune hepatitis. Started on long taper of prednisone. May need to be started on Thiopurine if TPMT is normal. LFTs to be done on 7/7   Prediabetes, POA: Last hemoglobin A1c of 6.2% on 06/02/2023.  Follow blood glucose levels closely.   E. coli urinary tract infection, POA: Patient has completed 7-day course of IV Rocephin .  No evidence of active infection at this time.   Hyperlipidemia, POA: Continue to hold statin in the setting of abnormal liver enzymes.   Ischemic CVA, POA: No acute issues   Glaucoma, POA: Continue with eyedrops.   Severe protein calorie malnutrition, POA: As evidenced by low albumin in the setting of multiple comorbidities.   Class I obesity, POA: Counseled extensively regarding diet modification and exercise to facilitate weight loss.   Disposition: Inpatient rehab        Consultants: GI, nephrology Procedures performed: US  guided liver and right kidney biopsy, Tunneled Right internal jugular HD placement with subsequent removal Disposition: Rehabilitation facility Diet recommendation:  Regular diet DISCHARGE MEDICATION: Allergies as of 07/07/2023       Reactions   Hydrochlorothiazide Itching        Medication List     TAKE these medications  amLODipine  5 MG tablet Commonly known as: NORVASC  TAKE 1 TABLET BY MOUTH DAILY What changed: when to take this   aspirin  EC 81 MG tablet Take 81 mg by mouth daily. Swallow whole.   atorvastatin  80 MG  tablet Commonly known as: LIPITOR TAKE 1 TABLET BY MOUTH DAILY   CALCIUM  500 PO Take 500 mg by mouth daily with breakfast.   dorzolamide -timolol  2-0.5 % ophthalmic solution Commonly known as: COSOPT  Place 1 drop into both eyes in the morning and at bedtime.   ezetimibe  10 MG tablet Commonly known as: ZETIA  Take 1 tablet (10 mg total) by mouth daily.   finasteride 5 MG tablet Commonly known as: PROSCAR Take 5 mg by mouth daily.   furosemide  40 MG tablet Commonly known as: LASIX  Take 1 tablet (40 mg total) by mouth 2 (two) times daily.   latanoprost  0.005 % ophthalmic solution Commonly known as: XALATAN  Place 1 drop into both eyes at bedtime.   polyethylene glycol 17 g packet Commonly known as: MIRALAX  / GLYCOLAX  Take 17 g by mouth daily as needed for mild constipation.   predniSONE 20 MG tablet Commonly known as: DELTASONE Take 2 tablets (40 mg total) by mouth as directed. 40 mg daily x 7 days, then 35 mg daily x 7 days, then 30 mg daily x 7 days, then 25 mg x 7, then 20 mg x 7, 15 mg x 7, 10 mg x 7, 5 mg x 7 and stop.   valsartan  160 MG tablet Commonly known as: Diovan  Take 1 tablet (160 mg total) by mouth daily.   Vitamin D3 50 MCG (2000 UT) Tabs Take 2,000 Units by mouth daily with breakfast.        Follow-up Information     Jarold Medici, MD Follow up in 1 week(s).   Specialty: Internal Medicine Contact information: [address] [place] [address] [phone]         Mansouraty, Aloha Raddle., MD Follow up.   Specialties: Gastroenterology, Internal Medicine Why: Autoimmune hepatitis Contact information: [address] [place] [address] [phone]                Discharge Exam: Filed Weights   07/04/23 2100 07/06/23 0712 07/07/23 0718  Weight: 80.9 kg 81 kg 80.5 kg   Constitutional: NAD, calm, comfortable Eyes: PERRL, lids and conjunctivae normal ENMT: Mucous membranes are moist. Posterior pharynx clear of any  exudate or lesions.Normal dentition.  Neck: normal, supple, no masses, no thyromegaly Respiratory: clear to auscultation bilaterally, no wheezing, no crackles. Normal respiratory effort. No accessory muscle use.  Cardiovascular: Regular rate and rhythm, no murmurs / rubs / gallops. No extremity edema. 2+ pedal pulses. No carotid bruits.  Abdomen: no tenderness, no masses palpated. No hepatosplenomegaly. Bowel sounds positive.  Musculoskeletal: no clubbing / cyanosis. No joint deformity upper and lower extremities. Good ROM, no contractures. Normal muscle tone.  Skin: no rashes, lesions, ulcers. No induration Neurologic: CN 2-12 grossly intact. Sensation intact, DTR normal. Strength 5/5 x all 4 extremities.  Psychiatric: Normal judgment and insight. Alert and oriented x 3. Normal mood.    Condition at discharge: good  The results of significant diagnostics from this hospitalization (including imaging, microbiology, ancillary and laboratory) are listed below for reference.   Imaging Studies: US  BIOPSY (LIVER) Result Date: 07/04/2023 INDICATION: Elevated LFTs.  Request for random liver biopsy. EXAM: ULTRASOUND-GUIDED LIVER BIOPSY MEDICATIONS: Moderate sedation ANESTHESIA/SEDATION: Moderate (conscious) sedation was employed during this procedure. A total of Versed  1  mg and Fentanyl  75 mcg was administered intravenously by the radiology nurse. Total intra-service moderate Sedation Time: 20 minutes. The patient's level of consciousness and vital signs were monitored continuously by radiology nursing throughout the procedure under my direct supervision. FLUOROSCOPY TIME:  None COMPLICATIONS: None immediate. PROCEDURE: Informed written consent was obtained from the patient after a thorough discussion of the procedural risks, benefits and alternatives. All questions were addressed. A timeout was performed prior to the initiation of the procedure. Abdomen was evaluated with ultrasound. The right hepatic lobe  was identified. The right side of the abdomen was prepped with chlorhexidine  and sterile field was created. Skin was anesthetized using 1% lidocaine . Small incision was made. Using ultrasound guidance, 17 gauge coaxial needle was directed into the right hepatic lobe. Total of 3 core biopsies were performed with an 18 gauge core device. Three adequate specimens obtained and placed in formalin. Gel-Foam slurry was injected as the 17 gauge needle was removed. Bandage placed over the puncture site. FINDINGS: Core biopsies obtained from the right hepatic lobe. No immediate bleeding or hematoma formation. IMPRESSION: Successful ultrasound-guided core biopsies from the right hepatic lobe. Electronically Signed   By: Juliene Balder M.D.   On: 07/04/2023 16:25   IR Removal Tun Cv Cath W/O FL Result Date: 07/04/2023 INDICATION: 78 year old female with history of AKI requiring dialysis, status post tunneled dialysis catheter placement 06/27/2023 presents with recovery of renal function. Request for tunneled dialysis catheter removal. EXAM: REMOVAL OF TUNNELED HEMODIALYSIS CATHETER MEDICATIONS: None COMPLICATIONS: None immediate. PROCEDURE: Informed written consent was obtained from the patient following an explanation of the procedure, risks, benefits and alternatives to treatment. A time out was performed prior to the initiation of the procedure. Sterile technique was utilized including mask, sterile gloves, sterile drape, and hand hygiene. ChloraPrep was used to prep the patient's right neck, chest and existing catheter. 1% lidocaine  was injected around the catheter and the subcutaneous tunnel. The catheter was removed intact by applying gentle manual retraction. Hemostasis was obtained with manual compression. A dressing was placed. The patient tolerated the procedure well without immediate post procedural complication. IMPRESSION: Successful removal of tunneled dialysis catheter. Performed by: Aimee Han, PA-C Electronically  Signed   By: Marcey Moan M.D.   On: 07/04/2023 09:00   MR ABDOMEN MRCP WO CONTRAST Result Date: 07/02/2023 CLINICAL DATA:  Jaundice. EXAM: MRI ABDOMEN WITHOUT CONTRAST  (INCLUDING MRCP) TECHNIQUE: Multiplanar multisequence MR imaging of the abdomen was performed. Heavily T2-weighted images of the biliary and pancreatic ducts were obtained, and three-dimensional MRCP images were rendered by post processing. COMPARISON:  Ultrasound 06/29/2023 FINDINGS: Lower chest: Small bilateral pleural effusions. Hepatobiliary: There is abnormal heterogeneous appearance of the liver with multifocal scattered areas of relative increased T2 signal throughout both lobes of liver with increased T2 periportal signal. The gallbladder appears within normal limits. Fusiform dilatation of the common bile duct measures up to 1 cm. There is moderate intrahepatic dilatation to the left lobe. Mild right lobe of liver dilatation. No signs of choledocholithiasis. Pancreas: The main pancreatic duct is prominent measuring up to 4 mm. Multiple scattered areas of side branch duct ectasia identified. Multiple larger cysts are also identified predominantly within the head of pancreas. The dominant cyst is centered around the uncinate process measuring 5.2 x 4.6 cm, image 19/5. No signs of pancreatic inflammation. Spleen: Within normal limits in size and appearance. Normal adrenal glands. Adrenals/Urinary Tract: Normal adrenal glands. No hydronephrosis. Lesion off the anterior cortex of the upper pole of the  right kidney measures 0.9 cm, image 36/8. This is isointense on the coronal T2 haste. On the T2 fat sat sequences there is decreased signal within this lesion. Similarly on the out of phase sequences there is relative decreased signal within this lesion. On the unenhanced pre contrast T1 weighted sequence this is relatively hypointense to the adjacent renal parenchyma. Stomach/Bowel: Visualized portions within the abdomen are unremarkable.  Vascular/Lymphatic: Normal caliber of the abdominal aorta. Porta hepatic lymph node measures 1.3 cm, image 18/3. No retroperitoneal adenopathy. Other:  No ascites.  There is edema extending along both flanks. Musculoskeletal: No suspicious bone lesions identified. IMPRESSION: 1. Abnormal heterogeneous appearance of the liver with multifocal scattered areas of relative increased T2 signal throughout both lobes of liver with increased T2 periportal signal. Imaging findings are nonspecific likely reflect hepatic inflammation, edema, early infiltrative disease or evolving hepatocellular injury. Underlying infiltrative tumor would be difficult to exclude without the benefit of IV contrast material. 2. Fusiform dilatation of the common bile duct measures up to 1 cm. There is moderate intrahepatic dilatation to the left lobe. Mild right lobe of liver biliary dilatation. No signs of choledocholithiasis. 3. Numerous cysts identified within the dominant cyst is centered around the uncinate process measuring 5.2 x 4.6 cm. Findings may reflect mucinous cystic neoplasm versus side branch IPMN. Recommend referral to gastroenterology for further management. 4. Enlarged porta hepatic lymph node measures 1.3 cm. 5. Small bilateral pleural effusions. 6. Lesion off the anterior cortex of the upper pole of the right kidney measures 0.9 cm. 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 Electronically Signed   By: Waddell Calk M.D.   On: 07/02/2023 05:59   MR 3D Recon At Scanner Result Date: 07/02/2023 CLINICAL DATA:  Jaundice. EXAM: MRI ABDOMEN WITHOUT CONTRAST  (INCLUDING MRCP) TECHNIQUE: Multiplanar multisequence MR imaging of the abdomen was performed. Heavily T2-weighted images of the biliary and pancreatic ducts were obtained, and three-dimensional MRCP images were rendered by post processing. COMPARISON:  Ultrasound 06/29/2023 FINDINGS: Lower chest:  Small bilateral pleural effusions. Hepatobiliary: There is abnormal heterogeneous appearance of the liver with multifocal scattered areas of relative increased T2 signal throughout both lobes of liver with increased T2 periportal signal. The gallbladder appears within normal limits. Fusiform dilatation of the common bile duct measures up to 1 cm. There is moderate intrahepatic dilatation to the left lobe. Mild right lobe of liver dilatation. No signs of choledocholithiasis. Pancreas: The main pancreatic duct is prominent measuring up to 4 mm. Multiple scattered areas of side branch duct ectasia identified. Multiple larger cysts are also identified predominantly within the head of pancreas. The dominant cyst is centered around the uncinate process measuring 5.2 x 4.6 cm, image 19/5. No signs of pancreatic inflammation. Spleen: Within normal limits in size and appearance. Normal adrenal glands. Adrenals/Urinary Tract: Normal adrenal glands. No hydronephrosis. Lesion off the anterior cortex of the upper pole of the right kidney measures 0.9 cm, image 36/8. This is isointense on the coronal T2 haste. On the T2 fat sat sequences there is decreased signal within this lesion. Similarly on the out of phase sequences there is relative decreased signal within this lesion. On the unenhanced pre contrast T1 weighted sequence this is relatively hypointense to the adjacent renal parenchyma. Stomach/Bowel: Visualized portions within the abdomen are unremarkable. Vascular/Lymphatic: Normal caliber of the abdominal aorta. Porta hepatic lymph node measures 1.3 cm, image 18/3. No retroperitoneal adenopathy. Other:  No ascites.  There is  edema extending along both flanks. Musculoskeletal: No suspicious bone lesions identified. IMPRESSION: 1. Abnormal heterogeneous appearance of the liver with multifocal scattered areas of relative increased T2 signal throughout both lobes of liver with increased T2 periportal signal. Imaging findings  are nonspecific likely reflect hepatic inflammation, edema, early infiltrative disease or evolving hepatocellular injury. Underlying infiltrative tumor would be difficult to exclude without the benefit of IV contrast material. 2. Fusiform dilatation of the common bile duct measures up to 1 cm. There is moderate intrahepatic dilatation to the left lobe. Mild right lobe of liver biliary dilatation. No signs of choledocholithiasis. 3. Numerous cysts identified within the dominant cyst is centered around the uncinate process measuring 5.2 x 4.6 cm. Findings may reflect mucinous cystic neoplasm versus side branch IPMN. Recommend referral to gastroenterology for further management. 4. Enlarged porta hepatic lymph node measures 1.3 cm. 5. Small bilateral pleural effusions. 6. Lesion off the anterior cortex of the upper pole of the right kidney measures 0.9 cm. 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 Electronically Signed   By: Waddell Calk M.D.   On: 07/02/2023 05:59   US  Abdomen Limited RUQ (LIVER/GB) Result Date: 06/29/2023 CLINICAL DATA:  Abnormal LFTs EXAM: ULTRASOUND ABDOMEN LIMITED RIGHT UPPER QUADRANT COMPARISON:  None Available. FINDINGS: Gallbladder: No gallstones or wall thickening visualized. No sonographic Murphy sign noted by sonographer. Common bile duct: Diameter: 5.5 mm Liver: Mildly increased in echogenicity consistent with fatty infiltration. No focal mass is seen. Portal vein is patent on color Doppler imaging with normal direction of blood flow towards the liver. Other: None. IMPRESSION: Fatty liver.  No other focal abnormality is seen. Electronically Signed   By: Oneil Devonshire M.D.   On: 06/29/2023 20:29   IR Fluoro Guide CV Line Right Result Date: 06/27/2023 CLINICAL DATA:  Renal insufficiency, needs access for hemodialysis EXAM: TUNNELED HEMODIALYSIS CATHETER PLACEMENT WITH ULTRASOUND AND FLUOROSCOPIC GUIDANCE  TECHNIQUE: The procedure, risks, benefits, and alternatives were explained to the patient. Questions regarding the procedure were encouraged and answered. The patient understands and consents to the procedure. As antibiotic prophylaxis, cefazolin  2 g was ordered pre-procedure and administered intravenously within one hour of incision.Patency of the right IJ vein was confirmed with ultrasound with image documentation. An appropriate skin site was determined. Region was prepped using maximum barrier technique including cap and mask, sterile gown, sterile gloves, large sterile sheet, and Chlorhexidine  as cutaneous antisepsis. The region was infiltrated locally with 1% lidocaine . Intravenous Fentanyl  50mcg and Versed  1mg  were administered by RN during a total moderate (conscious) sedation time of 12 minutes; the patient's level of consciousness and physiological / cardiorespiratory status were monitored continuously by radiology RN under my direct supervision. Under real-time ultrasound guidance, the right IJ vein was accessed with a 21 gauge micropuncture needle; the needle tip within the vein was confirmed with ultrasound image documentation. Needle exchanged over the 018 guidewire for transitional dilator, which allowed advancement of a Benson wire into the IVC. Over this, an MPA catheter was advanced. A Palindrome 19 hemodialysis catheter was tunneled from the right anterior chest wall approach to the right IJ dermatotomy site. The MPA catheter was exchanged over an Amplatz wire for serial vascular dilators which allow placement of a peel-away sheath, through which the catheter was advanced under intermittent fluoroscopy, positioned with its tips in the proximal and midright atrium. Spot chest radiograph confirms good catheter position. No pneumothorax. Catheter was flushed and primed per protocol. Catheter secured  externally with O Prolene sutures. The right IJ dermatotomy site was closed with Dermabond.  COMPLICATIONS: COMPLICATIONS None immediate FLUOROSCOPY: Radiation Exposure Index (as provided by the fluoroscopic device): 6.7 mGy air Kerma COMPARISON:  None Available. IMPRESSION: 1. Technically successful placement of tunneled right IJ hemodialysis catheter with ultrasound and fluoroscopic guidance. Ready for routine use. ACCESS: Remains approachable for percutaneous intervention as needed. Electronically Signed   By: JONETTA Faes M.D.   On: 06/27/2023 15:05   IR US  Guide Vasc Access Right Result Date: 06/27/2023 CLINICAL DATA:  Renal insufficiency, needs access for hemodialysis EXAM: TUNNELED HEMODIALYSIS CATHETER PLACEMENT WITH ULTRASOUND AND FLUOROSCOPIC GUIDANCE TECHNIQUE: The procedure, risks, benefits, and alternatives were explained to the patient. Questions regarding the procedure were encouraged and answered. The patient understands and consents to the procedure. As antibiotic prophylaxis, cefazolin  2 g was ordered pre-procedure and administered intravenously within one hour of incision.Patency of the right IJ vein was confirmed with ultrasound with image documentation. An appropriate skin site was determined. Region was prepped using maximum barrier technique including cap and mask, sterile gown, sterile gloves, large sterile sheet, and Chlorhexidine  as cutaneous antisepsis. The region was infiltrated locally with 1% lidocaine . Intravenous Fentanyl  50mcg and Versed  1mg  were administered by RN during a total moderate (conscious) sedation time of 12 minutes; the patient's level of consciousness and physiological / cardiorespiratory status were monitored continuously by radiology RN under my direct supervision. Under real-time ultrasound guidance, the right IJ vein was accessed with a 21 gauge micropuncture needle; the needle tip within the vein was confirmed with ultrasound image documentation. Needle exchanged over the 018 guidewire for transitional dilator, which allowed advancement of a Benson wire  into the IVC. Over this, an MPA catheter was advanced. A Palindrome 19 hemodialysis catheter was tunneled from the right anterior chest wall approach to the right IJ dermatotomy site. The MPA catheter was exchanged over an Amplatz wire for serial vascular dilators which allow placement of a peel-away sheath, through which the catheter was advanced under intermittent fluoroscopy, positioned with its tips in the proximal and midright atrium. Spot chest radiograph confirms good catheter position. No pneumothorax. Catheter was flushed and primed per protocol. Catheter secured externally with O Prolene sutures. The right IJ dermatotomy site was closed with Dermabond. COMPLICATIONS: COMPLICATIONS None immediate FLUOROSCOPY: Radiation Exposure Index (as provided by the fluoroscopic device): 6.7 mGy air Kerma COMPARISON:  None Available. IMPRESSION: 1. Technically successful placement of tunneled right IJ hemodialysis catheter with ultrasound and fluoroscopic guidance. Ready for routine use. ACCESS: Remains approachable for percutaneous intervention as needed. Electronically Signed   By: JONETTA Faes M.D.   On: 06/27/2023 15:05   US  BIOPSY (KIDNEY) Result Date: 06/27/2023 INDICATION: 78 year old female with history of acute kidney injury. EXAM: ULTRASOUND GUIDED RENAL BIOPSY COMPARISON:  None Available. MEDICATIONS: None. ANESTHESIA/SEDATION: Fentanyl  25 mcg IV; Versed  2 mg IV Total Moderate Sedation time: 12 minutes; The patient was continuously monitored during the procedure by the interventional radiology nurse under my direct supervision. COMPLICATIONS: None immediate. PROCEDURE: Informed written consent was obtained from the patient after a discussion of the risks, benefits and alternatives to treatment. The patient understands and consents the procedure. A timeout was performed prior to the initiation of the procedure. Ultrasound scanning was performed of the bilateral flanks. The inferior pole of the right kidney  was selected for biopsy due to location and sonographic window. The procedure was planned. The operative site was prepped and draped in the usual sterile fashion. The overlying soft tissues  were anesthetized with 1% lidocaine  with epinephrine. A 17 gauge coaxial introducer needle was advanced into the inferior cortex of the right kidney and 2 core biopsies were obtained under direct ultrasound guidance with an 18 gauge biopsy device. Images were saved for documentation purposes. The biopsy device was removed and Gel-Foam slurry was injected under ultrasound visualization while removing the coaxial needle. Hemostasis was obtained with manual compression. Post procedural scanning was negative for significant post procedural hemorrhage or additional complication. A dressing was placed. The patient tolerated the procedure well without immediate post procedural complication. IMPRESSION: Technically successful ultrasound guided right renal biopsy. Ester Sides, MD [facility] Electronically Signed   By: Ester Sides M.D.   On: 06/27/2023 13:39   US  RENAL Result Date: 06/17/2023 CLINICAL DATA:  Acute kidney injury EXAM: RENAL / URINARY TRACT ULTRASOUND COMPLETE COMPARISON:  None Available. FINDINGS: Right Kidney: Renal measurements: 10.2 x 5.3 x 5.1 cm = volume: 142.5 mL. Cortex appears slightly echogenic. No hydronephrosis. Echogenic solid mass upper pole right kidney measuring 10 x 9 x 10 mm. Left Kidney: Renal measurements: 9.5 x 5.5 x 4.6 cm = volume: 124.7 mL. Echogenicity within normal limits. No mass or hydronephrosis Bladder: Appears normal for degree of bladder distention. Other: None. IMPRESSION: 1. No hydronephrosis. Slightly echogenic right renal cortex as may be seen with medical renal disease. 2. 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 Recommend follow-up ultrasound in 6 months. Electronically Signed   By: Luke Bun M.D.   On: 06/17/2023 23:03   DG Chest Port 1 View Result Date: 06/17/2023 CLINICAL DATA:  Fatigue and malaise for 3 days. EXAM: PORTABLE CHEST 1 VIEW COMPARISON:  09/07/2011. FINDINGS: Low lung volume. Bilateral lung fields are clear. Bilateral costophrenic angles are clear. Mildly enlarged cardio-mediastinal silhouette, likely accentuated by low lung volume and AP technique. No acute osseous abnormalities. The soft tissues are within normal limits. IMPRESSION: No active disease. Electronically Signed   By: Ree Molt M.D.   On: 06/17/2023 12:38    Microbiology: Results for orders placed or performed during the hospital encounter of 06/17/23  Urine Culture (for pregnant, neutropenic or urologic patients or patients with an indwelling urinary catheter)     Status: Abnormal   Collection Time: 06/17/23 10:24 PM   Specimen: Urine, Clean Catch  Result Value Ref Range Status   Specimen Description   Final    URINE, CLEAN CATCH Performed at [facility], [address], [place], [place] [address]    Special Requests   Final    NONE Performed at [facility], [address], [place], [place] [address]    Culture >=100,000 COLONIES/mL ESCHERICHIA COLI (A)  Final   Report Status 06/20/2023 FINAL  Final   Organism ID, Bacteria ESCHERICHIA COLI (A)  Final      Susceptibility   Escherichia coli - MIC*    AMPICILLIN <=2 SENSITIVE Sensitive     CEFAZOLIN  <=4 SENSITIVE Sensitive     CEFEPIME <=0.12 SENSITIVE Sensitive     CEFTRIAXONE  <=0.25 SENSITIVE Sensitive     CIPROFLOXACIN <=0.25 SENSITIVE Sensitive     GENTAMICIN <=1 SENSITIVE Sensitive     IMIPENEM <=0.25 SENSITIVE Sensitive     NITROFURANTOIN  <=16 SENSITIVE Sensitive     TRIMETH/SULFA <=20 SENSITIVE Sensitive     AMPICILLIN/SULBACTAM <=2 SENSITIVE Sensitive     PIP/TAZO <=4 SENSITIVE Sensitive ug/mL    * >=100,000 COLONIES/mL ESCHERICHIA COLI  MRSA  Next Gen by PCR, Nasal     Status:  None   Collection Time: 06/29/23  8:21 AM   Specimen: Nasal Mucosa; Nasal Swab  Result Value Ref Range Status   MRSA by PCR Next Gen NOT DETECTED NOT DETECTED Final    Comment: (NOTE) The GeneXpert MRSA Assay (FDA approved for NASAL specimens only), is one component of a comprehensive MRSA colonization surveillance program. It is not intended to diagnose MRSA infection nor to guide or monitor treatment for MRSA infections. Test performance is not FDA approved in patients less than 49 years old. Performed at [facility], [address], [place], [place] [address]     Labs: CBC: Recent Labs  Lab 07/02/23 [phone] 07/03/23 0807 07/04/23 0547 07/05/23 0518 07/06/23 0535  WBC 15.5* 13.0* 12.9* 10.0 10.7*  NEUTROABS 11.2* 10.3* 8.2* 6.6 7.0  HGB 13.1 12.8 12.7 12.4 13.5  HCT 38.4 38.0 36.7 36.5 41.4  MCV 89.9 90.9 90.8 91.5 96.1  PLT 139* 133* 129* 155 107*   Basic Metabolic Panel: Recent Labs  Lab 07/02/23 0712 07/03/23 0807 07/04/23 0547 07/05/23 0518 07/06/23 0535 07/07/23 0447  NA 145 143 143 140 140 138  K 4.1 4.3 4.7 4.0 3.6 3.3*  CL 115* 114* 116* 117* 112* 113*  CO2 21* 23 18* 19* 21* 18*  GLUCOSE 181* 181* 85 89 123* 145*  BUN 69* 57* 58* 54* 39* 38*  CREATININE 2.00* 2.09* 1.94* 1.63* 1.51* 1.64*  CALCIUM  8.8* 8.7* 8.7* 8.1* 8.1* 8.3*  MG 1.8 1.7 1.8 1.9 1.8  --   PHOS 4.3 4.5 4.7* 4.8* 3.7  --    Liver Function Tests: Recent Labs  Lab 07/03/23 0807 07/04/23 0547 07/05/23 0518 07/06/23 0535 07/07/23 0447  AST 677* 676* 689* 700* 595*  ALT 451* 456* 446* 461* 441*  ALKPHOS 227* 221* 216* 226* 216*  BILITOT 3.5* 3.8* 4.3* 3.5* 3.0*  PROT 5.4* 5.3* 5.3* 5.3* 5.4*  ALBUMIN 1.7* 1.7* 1.6* 1.6* 1.6*   CBG: No results for input(s): GLUCAP in the last 168 hours.  Discharge time spent: 42 minutes  Signed: Deliliah Room, MD [facility] 07/07/2023

## 2023-07-07 NOTE — Plan of Care (Signed)
  Problem: Health Behavior/Discharge Planning: Goal: Ability to manage health-related needs will improve Outcome: Progressing   Problem: Clinical Measurements: Goal: Ability to maintain clinical measurements within normal limits will improve Outcome: Progressing   Problem: Activity: Goal: Risk for activity intolerance will decrease Outcome: Progressing   Problem: Coping: Goal: Level of anxiety will decrease Outcome: Progressing   Problem: Elimination: Goal: Will not experience complications related to bowel motility Outcome: Progressing Goal: Will not experience complications related to urinary retention Outcome: Progressing   Problem: Pain Managment: Goal: General experience of comfort will improve and/or be controlled Outcome: Progressing   Problem: Safety: Goal: Ability to remain free from injury will improve Outcome: Progressing   Problem: Education: Goal: Knowledge of General Education information will improve Description: Including pain rating scale, medication(s)/side effects and non-pharmacologic comfort measures Outcome: Progressing   Problem: Health Behavior/Discharge Planning: Goal: Ability to manage health-related needs will improve Outcome: Progressing   Problem: Clinical Measurements: Goal: Ability to maintain clinical measurements within normal limits will improve Outcome: Progressing Goal: Will remain free from infection Outcome: Progressing Goal: Diagnostic test results will improve Outcome: Progressing Goal: Respiratory complications will improve Outcome: Progressing Goal: Cardiovascular complication will be avoided Outcome: Progressing   Problem: Nutrition: Goal: Adequate nutrition will be maintained Outcome: Progressing   Problem: Coping: Goal: Level of anxiety will decrease Outcome: Progressing   Problem: Elimination: Goal: Will not experience complications related to bowel motility Outcome: Progressing Goal: Will not experience  complications related to urinary retention Outcome: Progressing   Problem: Pain Managment: Goal: General experience of comfort will improve and/or be controlled Outcome: Progressing   Problem: Safety: Goal: Ability to remain free from injury will improve Outcome: Progressing   Problem: Urinary Elimination: Goal: Signs and symptoms of infection will decrease Outcome: Progressing   Problem: Education: Goal: Knowledge of disease and its progression will improve Outcome: Progressing   Problem: Health Behavior/Discharge Planning: Goal: Ability to manage health-related needs will improve Outcome: Progressing   Problem: Clinical Measurements: Goal: Complications related to the disease process or treatment will be avoided or minimized Outcome: Progressing Goal: Dialysis access will remain free of complications Outcome: Progressing   Problem: Activity: Goal: Activity intolerance will improve Outcome: Progressing   Problem: Fluid Volume: Goal: Fluid volume balance will be maintained or improved Outcome: Progressing   Problem: Nutritional: Goal: Ability to make appropriate dietary choices will improve Outcome: Progressing   Problem: Respiratory: Goal: Respiratory symptoms related to disease process will be avoided Outcome: Progressing   Problem: Self-Concept: Goal: Body image disturbance will be avoided or minimized Outcome: Progressing   Problem: Urinary Elimination: Goal: Progression of disease will be identified and treated Outcome: Progressing

## 2023-07-07 NOTE — Progress Notes (Addendum)
 Physical Therapy Treatment Patient Details Name: Marissa Orozco MRN: [id number] DOB: Mar 02, 1945 Today's Date: 07/07/2023   History of Present Illness Marissa Orozco is a 78 y.o. female who presented to ED with complaints of low blood pressure and fatigue. Pt found to have an acute kidney injury. Pt underwent Rt kidney biopsy 6/23. PMH: hx of CVA, HTN, HLD, hx of kidney stones    PT Comments  Pt making steady progress towards her physical therapy goals today; pt subjectively reports mild improvement in fatigue level. Session focused on exercises to address large muscle groups for strengthening and endurance. Pt ambulating 15 ft x 2 with a walker and CGA. Pt discharged to [facility], for which she will be an excellent candidate for to address deficits and maximize functional independence.    If plan is discharge home, recommend the following: A lot of help with walking and/or transfers;A lot of help with bathing/dressing/bathroom;Assist for transportation;Help with stairs or ramp for entrance   Can travel by private vehicle        Equipment Recommendations  Rolling walker (2 wheels);BSC/3in1;Wheelchair (measurements PT)    Recommendations for Other Services       Precautions / Restrictions Precautions Precautions: Fall Recall of Precautions/Restrictions: Intact Restrictions Weight Bearing Restrictions Per Provider Order: No     Mobility  Bed Mobility Overal bed mobility: Needs Assistance Bed Mobility: Supine to Sit, Sit to Supine     Supine to sit: Supervision Sit to supine: Min assist   General bed mobility comments: Increased time, minA for LE elevation back into bed    Transfers Overall transfer level: Needs assistance Equipment used: Rolling walker (2 wheels) Transfers: Sit to/from Stand Sit to Stand: Contact guard assist           General transfer comment: Increased time/effort, no physical assist    Ambulation/Gait Ambulation/Gait assistance: Contact guard  assist Gait Distance (Feet): 15 Feet (15, 15) Assistive device: Rolling walker (2 wheels) Gait Pattern/deviations: Step-through pattern, Decreased stride length Gait velocity: decreased Gait velocity interpretation: <1.31 ft/sec, indicative of household ambulator   General Gait Details: Right lateral shift, slowed speed, CGA for safety. Cues for monitoring activity tolerance   Stairs             Wheelchair Mobility     Tilt Bed    Modified Rankin (Stroke Patients Only)       Balance Overall balance assessment: Needs assistance Sitting-balance support: Feet supported, Bilateral upper extremity supported Sitting balance-Leahy Scale: Fair     Standing balance support: Bilateral upper extremity supported, During functional activity, Reliant on assistive device for balance Standing balance-Leahy Scale: Poor                              Communication Communication Communication: No apparent difficulties  Cognition Arousal: Alert Behavior During Therapy: WFL for tasks assessed/performed   PT - Cognitive impairments: No apparent impairments                         Following commands: Intact      Cueing Cueing Techniques: Verbal cues  Exercises Other Exercises Other Exercises: Supine: bridging x 10 (2 sets) Other Exercises: x5 serial sit to stand Other Exercises: Lateral stepping to R/L at sink x 3 ft in either direction (x3 sets) Other Exercises: Standing: overhead presses with basin x 10    General Comments        Pertinent  Vitals/Pain      Home Living                          Prior Function            PT Goals (current goals can now be found in the care plan section) Acute Rehab PT Goals Patient Stated Goal: to get strong enough to go home Potential to Achieve Goals: Good Progress towards PT goals: Progressing toward goals    Frequency    Min 3X/week      PT Plan      Co-evaluation               AM-PAC PT 6 Clicks Mobility   Outcome Measure  Help needed turning from your back to your side while in a flat bed without using bedrails?: A Little Help needed moving from lying on your back to sitting on the side of a flat bed without using bedrails?: A Little Help needed moving to and from a bed to a chair (including a wheelchair)?: A Little Help needed standing up from a chair using your arms (e.g., wheelchair or bedside chair)?: A Little Help needed to walk in hospital room?: A Little Help needed climbing 3-5 steps with a railing? : Total 6 Click Score: 16    End of Session Equipment Utilized During Treatment: Gait belt Activity Tolerance: Patient tolerated treatment well Patient left: with call bell/phone within reach;in bed Nurse Communication: Mobility status PT Visit Diagnosis: Unsteadiness on feet (R26.81);Muscle weakness (generalized) (M62.81);Difficulty in walking, not elsewhere classified (R26.2)     Time: 8981-8956 PT Time Calculation (min) (ACUTE ONLY): 25 min  Charges:    $Therapeutic Activity: 23-37 mins PT General Charges $$ ACUTE PT VISIT: 1 Visit                     Aleck Orozco, PT, DPT Acute Rehabilitation Services Office [phone]    Marissa Orozco 07/07/2023, 1:09 PM

## 2023-07-07 NOTE — H&P (Signed)
 Expand All Collapse All      Physical Medicine and Rehabilitation Admission H&P        Chief Complaint  Patient presents with   Functional deficits due to Myopathy  : HPI: Marissa Orozco is a 78 year old L handed female with history of CVA, HTN, HLD, glaucoma, and hx of kidney stones presented to [facility] on 06/17/2023 with complaints of low blood pressures, persistent fatigue, and poor intake for several days otherwise no other symptoms. Per her daughter, she was working out doing yoga/pilates days prior to admission  Admission chemistries BUN 30 and cr 2.79.  UTI positive for E. coli treated with ABX.  She was admitted to [facility] on 06/17/23 for AKI and treated with IVF.  Nephrology consulted and she was transferred to [facility] on 06/23/2023 d/t to concerns for glomerulonephritis and rising BUN/CR. IR consulted placing a right IJ TDC on 06/25/23. She received 1 round of HD.  No BX revealed chronic changes, AIN and some TMA but no immune complex mediated or crescentic glomerulonephritis noted.  On 6/26 UOP and Scr improved and dialysis catheter removed 07/04/23.    GI consulted due to  significantly elevated LFTs. US  of RUQ showed fatty liver w/o focal abnormality. There were incidental findings of tranaminitis/bilirubinemia and a pancreatic cyst measuring 5.2 cm x 4.6 cm and CA 19-9 was elevated to 139. 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 Recommendations to start prednisone taper (started 07/06/23) close monitoring of LFTs, trend IgG and an outpatient referral to hepatology for EUS.  TMPT results are still pending. Prior to arrival patient was on amlodipine  and atorvastatin  for hypertension but remains off of ARB in the setting of AKI and noted hypotension.    Per chart review patient was independent without need for AD, retired and drives.  Participates in aerobics 3 times weekly and lives in two  level home.  Currently requiring min-mod assist with mobility.  Min to max assist with ADLs and minimal assistance using RW. Therapy evaluations completed due to patient decreased functional mobility was admitted for a comprehensive rehab program.     Pt reports her main symptoms are severe weakness, mainly in proximal arms and legs are even worse.   Tired of blood draws.  No N/V- LBM yesterday.  More LE swelling. And notes that Ensure makes her vomit.    Review of Systems  Constitutional: Negative.   HENT: Negative.    Eyes: Negative.   Respiratory: Negative.  Negative for cough, hemoptysis, sputum production and shortness of breath.   Cardiovascular: Negative.  Negative for chest pain and leg swelling.  Gastrointestinal:  Negative for abdominal pain, constipation, diarrhea, nausea and vomiting.  Genitourinary: Negative.  Negative for dysuria and hematuria.  Musculoskeletal: Negative.  Negative for back pain, joint pain and myalgias.       Bilat LE weakness denies any pain   Skin: Negative.   Neurological:  Positive for dizziness and weakness.       Bilat LE weakness denies any pain   Endo/Heme/Allergies: Negative.   Psychiatric/Behavioral: Negative.    All other systems reviewed and are negative.                Past Medical History:  Diagnosis Date   Allergy 2000   Breast lump     Cataract     Colon polyps     Fibroids     GERD (gastroesophageal reflux disease)  Glaucoma     Heart murmur     Hyperlipidemia 01/05/1984   Hypertension 01/05/1984   Interstitial cystitis 01/04/1997   Kidney stones 01/05/2003   Neuromuscular disorder (HCC)     Osteoporosis     Preop cardiovascular exam 10/25/2022   Sleep apnea 2021   Tonsillitis               Past Surgical History:  Procedure Laterality Date   ABDOMINAL HYSTERECTOMY   1997   BREAST EXCISIONAL BIOPSY Right 07/27/2006    Intraductal Papilloma   BREAST SURGERY   2008   CATARACT EXTRACTION Bilateral 02/2020     other in 05/2020   COLONOSCOPY   2010    Dr Kristie   EYE SURGERY   2022   HYSTEROSCOPY   1993   IR FLUORO GUIDE CV LINE RIGHT   06/24/2023   IR REMOVAL TUN CV CATH W/O FL   07/04/2023   IR US  GUIDE VASC ACCESS RIGHT   06/24/2023   LAPAROSCOPIC HYSTERECTOMY   1994   MYOMECTOMY   1993    excision of tumor   POLYPECTOMY       TONSILLECTOMY   1953   TUBAL LIGATION   1980             Family History  Problem Relation Age of Onset   Hypertension Mother     Heart disease Mother     Colon cancer Mother 58   Arthritis Mother     Cancer Mother     Coronary artery disease Father          mother, PGM   Stroke Father     Hypertension Father     Colon cancer Father 30   Cancer Father     Colon cancer Maternal Grandmother     Diabetes Other     Breast cancer Neg Hx     Colon polyps Neg Hx     Esophageal cancer Neg Hx     Stomach cancer Neg Hx     Rectal cancer Neg Hx     BRCA 1/2 Neg Hx          Social History:  reports that she has never smoked. She has never used smokeless tobacco. She reports current alcohol use of about 5.0 standard drinks of alcohol per week. She reports that she does not use drugs. Allergies:  Allergies      Allergies  Allergen Reactions   Hydrochlorothiazide Itching            Medications Prior to Admission  Medication Sig Dispense Refill   amLODipine  (NORVASC ) 5 MG tablet TAKE 1 TABLET BY MOUTH DAILY (Patient taking differently: Take 5 mg by mouth at bedtime.) 90 tablet 3   aspirin  EC 81 MG tablet Take 81 mg by mouth daily. Swallow whole.       atorvastatin  (LIPITOR) 80 MG tablet TAKE 1 TABLET BY MOUTH DAILY 90 tablet 3   Calcium  Carbonate (CALCIUM  500 PO) Take 500 mg by mouth daily with breakfast.       Cholecalciferol (VITAMIN D3) 50 MCG (2000 UT) TABS Take 2,000 Units by mouth daily with breakfast.       dorzolamide -timolol  (COSOPT ) 22.3-6.8 MG/ML ophthalmic solution Place 1 drop into both eyes in the morning and at bedtime.       ezetimibe  (ZETIA ) 10  MG tablet Take 1 tablet (10 mg total) by mouth daily. 90 tablet 3   finasteride (PROSCAR) 5 MG tablet Take 5 mg  by mouth daily.       latanoprost  (XALATAN ) 0.005 % ophthalmic solution Place 1 drop into both eyes at bedtime.       valsartan  (DIOVAN ) 160 MG tablet Take 1 tablet (160 mg total) by mouth daily. 90 tablet 3              Home: Home Living Family/patient expects to be discharged to:: Private residence Living Arrangements: Spouse/significant other Available Help at Discharge: Family, Available 24 hours/day Type of Home: House Home Access: Stairs to enter [employer] of Steps: 4 Entrance Stairs-Rails: Can reach both Home Layout: Two level Alternate Level Stairs-Number of Steps: flight Alternate Level Stairs-Rails: Can reach both Bathroom Shower/Tub: Health visitor: Standard Home Equipment: None   Functional History: Prior Function Prior Level of Function : Independent/Modified Independent, Driving Mobility Comments: no AD, does aerobics at the [employer] ADLs Comments: indep   Functional Status:  Mobility: Bed Mobility Overal bed mobility: Needs Assistance Bed Mobility: Supine to Sit Supine to sit: Min assist Sit to supine: Mod assist General bed mobility comments: Pt able to sit up trunk with HOB elevated, minA to scoot R hip out to edge of bed Transfers Overall transfer level: Needs assistance Equipment used: Rolling walker (2 wheels) Transfers: Sit to/from Stand, Bed to chair/wheelchair/BSC Sit to Stand: Min assist Bed to/from chair/wheelchair/BSC transfer type:: Step pivot Stand pivot transfers: Min assist Step pivot transfers: Contact guard assist General transfer comment: Rocking forward for momentum, cues for nose over toes, minA to rise Ambulation/Gait General Gait Details: small side steps along EOB for hip exercises (side stepping for gluteus medius activation) cues to increase step width for great activation. 2x5 reps bil.    ADL: ADL Overall ADL's : Needs assistance/impaired Eating/Feeding: Independent, Sitting Grooming: Wash/dry hands, Wash/dry face, Set up, Supervision/safety, Sitting Upper Body Bathing: Contact guard assist, Sitting Lower Body Bathing: Maximal assistance, Sitting/lateral leans Upper Body Dressing : Set up, Supervision/safety, Sitting Lower Body Dressing: Total assistance, Sit to/from stand Toilet Transfer: Minimal assistance, Rolling walker (2 wheels), Cueing for safety, BSC/3in1, Ambulation Toileting- Clothing Manipulation and Hygiene: Minimal assistance, Sit to/from stand Functional mobility during ADLs: Minimal assistance, Rolling walker (2 wheels), Cueing for safety   Cognition: Cognition Orientation Level: Oriented X4 Cognition Arousal: Alert Behavior During Therapy: WFL for tasks assessed/performed   Physical Exam: Blood pressure (!) 123/53, pulse 66, temperature 97.9 F (36.6 C), temperature source Oral, resp. rate 18, height 5' 3.5 (1.613 m), weight 80.5 kg, SpO2 100%.     Physical Exam Vitals and nursing note reviewed. Exam conducted with a chaperone present.  Constitutional:      General: She is not in acute distress.    Appearance: Normal appearance. She is obese.     Comments: Pt awake, alert, appropriate, appears very fatigued and slightly delayed responses- eldest daughter at bedside, NAD  HENT:     Head: Normocephalic and atraumatic.     Right Ear: External ear normal.     Left Ear: External ear normal.     Mouth/Throat:     Mouth: Mucous membranes are moist.     Pharynx: Oropharynx is clear. No oropharyngeal exudate.  Eyes:     General:        Right eye: No discharge.        Left eye: No discharge.     Extraocular Movements: Extraocular movements intact.  Cardiovascular:     Rate and Rhythm: Normal rate and regular rhythm.     Pulses: Normal pulses.  Heart sounds: Normal heart sounds. No murmur heard. Pulmonary:     Effort: Pulmonary effort is  normal. No respiratory distress.     Breath sounds: Normal breath sounds. No wheezing, rhonchi or rales.  Abdominal:     General: Bowel sounds are normal. There is no distension.     Palpations: Abdomen is soft.     Tenderness: There is no abdominal tenderness.     Comments: LBM 7/2  Musculoskeletal:     Cervical back: Normal range of motion and neck supple.     Comments: Ue's- Deltoids 4-/5 otherwise 5-/5 throughout in Ue's LE's- RLE- HF 2+/5; 4+/5 otherwise LLE- HF 2-/5 KE/KF/DF/PF all 4+/5 otherwise   Skin:    General: Skin is warm and dry.     Capillary Refill: Capillary refill takes less than 2 seconds.     Coloration: Skin is pale.     Comments: No skin breakdown seen  Neurological:     Mental Status: She is alert.     Sensory: No sensory deficit.     Comments: Intact to light touch in all 4 extremities Voice OK, but sounds somewhat weak Ox3 and appropriate- but slightly delayed responses   Psychiatric:     Comments: Very flat- hard to get her engage with examiner             Lab Results Last 48 Hours        Results for orders placed or performed during the hospital encounter of 07/03/23 (from the past 48 hours)  Comprehensive metabolic panel with GFR     Status: Abnormal    Collection Time: 07/06/23  5:35 AM  Result Value Ref Range    Sodium 140 135 - 145 mmol/L    Potassium 3.6 3.5 - 5.1 mmol/L    Chloride 112 (H) 98 - 111 mmol/L    CO2 21 (L) 22 - 32 mmol/L    Glucose, Bld 123 (H) 70 - 99 mg/dL      Comment: Glucose reference range applies only to samples taken after fasting for at least 8 hours.    BUN 39 (H) 8 - 23 mg/dL    Creatinine, Ser 8.48 (H) 0.44 - 1.00 mg/dL    Calcium  8.1 (L) 8.9 - 10.3 mg/dL    Total Protein 5.3 (L) 6.5 - 8.1 g/dL    Albumin 1.6 (L) 3.5 - 5.0 g/dL    AST 299 (H) 15 - 41 U/L    ALT 461 (H) 0 - 44 U/L    Alkaline Phosphatase 226 (H) 38 - 126 U/L    Total Bilirubin 3.5 (H) 0.0 - 1.2 mg/dL    GFR, Estimated 35 (L) >60 mL/min       Comment: (NOTE) Calculated using the CKD-EPI Creatinine Equation (2021)      Anion gap 7 5 - 15      Comment: Performed at [facility], [address], [place], [place] [address]  Magnesium      Status: None    Collection Time: 07/06/23  5:35 AM  Result Value Ref Range    Magnesium  1.8 1.7 - 2.4 mg/dL      Comment: Performed at [facility], [address] [place], [place] [address]  Phosphorus     Status: None    Collection Time: 07/06/23  5:35 AM  Result Value Ref Range    Phosphorus 3.7 2.5 - 4.6 mg/dL      Comment: Performed at [facility], [address] [place], [place] [address]  CBC with Differential/Platelet     Status: Abnormal    Collection Time: 07/06/23  5:35 AM  Result Value Ref Range    WBC 10.7 (H) 4.0 - 10.5 K/uL    RBC 4.31 3.87 - 5.11 MIL/uL    Hemoglobin 13.5 12.0 - 15.0 g/dL    HCT 58.5 63.9 - 53.9 %    MCV 96.1 80.0 - 100.0 fL    MCH 31.3 26.0 - 34.0 pg    MCHC 32.6 30.0 - 36.0 g/dL    RDW 76.4 (H) 88.4 - 15.5 %    Platelets 107 (L) 150 - 400 K/uL      Comment: REPEATED TO VERIFY    nRBC 0.0 0.0 - 0.2 %    Neutrophils Relative % 65 %    Neutro Abs 7.0 1.7 - 7.7 K/uL    Lymphocytes Relative 20 %    Lymphs Abs 2.1 0.7 - 4.0 K/uL    Monocytes Relative 11 %    Monocytes Absolute 1.1 (H) 0.1 - 1.0 K/uL    Eosinophils Relative 2 %    Eosinophils Absolute 0.2 0.0 - 0.5 K/uL    Basophils Relative 1 %    Basophils Absolute 0.1 0.0 - 0.1 K/uL    WBC Morphology MORPHOLOGY UNREMARKABLE      RBC Morphology MORPHOLOGY UNREMARKABLE      Smear Review Normal platelet morphology      Immature Granulocytes 1 %    Abs Immature Granulocytes 0.07 0.00 - 0.07 K/uL      Comment: Performed at [facility], [address] [place], [place] [address]      Imaging Results (Last 48 hours)  No results found.         Blood pressure (!) 123/53, pulse 66, temperature 97.9 F (36.6 C), temperature source Oral, resp. rate 18, height 5' 3.5 (1.613  m), weight 80.5 kg, SpO2 100%.   Medical Problem List and Plan: 1. 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  Antithrombotics: -DVT/anticoagulation:  Mechanical: Sequential compression devices, below knee Bilateral lower extremities             -antiplatelet therapy: resume ASA 81 mg 7/4 3. Pain Management: No meds ordered- pt denies pain- cannot have tylenol  or NSAIDs due to LFT elevation/autoimmune hepatitis  4. Mood/Behavior/Sleep: LCSW to follow for evaluation and support when available.              -antipsychotic agents: N/A 5. Neuropsych/cognition: This patient is capable of making decisions on her own behalf. 6. Skin/Wound Care: Routine pressure relief measures  7. Fluids/Electrolytes/Nutrition: CMET in a.m   -Daily weights- Strict I&O              -Renal w/ 1200 fluid restrict              -Severe protien calorie malnutrition: add Glucerna - Alb 1.6- concern that it's due to her hepatitis- will monitor -recheck next Monday             -Vitamin C              -Constipation- LBM  7/2 reports hard pebble like stools-will add Senna-S 1 tab/day 8. Hypertension: holding home amlodipine   -Continue Lasix  40 mg P.O twice daily              - CTM BPs closely 9. Hypokalemia: K+ now 3.3 Goal K+ > 4.0 and Mag > 2.0.  -continue potassium 20 meq daily              -will recheck levels in AM 10. Transaminitis/Elevated Liver Enzyme due to Autoimmune hepatitis/pancreatic cyst/dilated bile duct: Monitor Hepatic panel + CBC w/diff -Repeat LFT's on 7/7 and 7/10 per GI  -Prednisone 40 mg started 07/06/23- Taper by 5 mg/week until done.              TMPT-pending may need to start Thiopurine if normal  -will need ASAP f/u with GI and hepatology after d/c- GI will attempt to work up pancreatic cyst and bile duct dilation while here.   11. Hyperlipidemia: continue holding atorvastatin  due to abnormal LFTs. 12. Glaucoma: Continue Dorzolamide -Timolol  and Latanoprost   13. Pre-Diabetes: A1c 6.2. now on prednisone              -monitor BS ac/hs and use SSI for elevated BS due to high doses of Prednisone 14.  Class I obesity: Counsel on diet modifications and exercise to facilitate weight loss. 15. Leukocytosis- just started Prednisone today- I'm not sure would cause WBC to go to 19k from 10.9k so fast- will recheck in AM and monitor clinically for possible infection 16. Thrombocytopenia- Plts dropped to 107k today from 155k- will recheck in AM- if better, will check Monday/Thursdays- to reduce lab draws       Daphne LITTIE Finders, NP 07/07/2023   I have personally performed a face to face diagnostic evaluation of this patient and formulated the key components of the plan.  Additionally, I have personally reviewed laboratory data, imaging studies, as well as relevant notes and concur with the physician assistant's documentation above.   The patient's status has not changed from the original H&P.  Any changes in documentation from the acute care chart have been noted above.

## 2023-07-07 NOTE — Progress Notes (Signed)
 Inpatient Rehabilitation Care Coordinator Assessment and Plan Patient Details  Name: Marissa Orozco Orozco MRN: [id number] Date of Birth: 11-Dec-1945  Today's Date: 07/07/2023  Hospital Problems: Principal Problem:   AKI (acute kidney injury) [facility])  Past Medical History:  Past Medical History:  Diagnosis Date   Allergy 2000   Breast lump    Cataract    Colon polyps    Fibroids    GERD (gastroesophageal reflux disease)    Glaucoma    Heart murmur    Hyperlipidemia 01/05/1984   Hypertension 01/05/1984   Interstitial cystitis 01/04/1997   Kidney stones 01/05/2003   Neuromuscular disorder (HCC)    Osteoporosis    Preop cardiovascular exam 10/25/2022   Sleep apnea 2021   Tonsillitis    Past Surgical History:  Past Surgical History:  Procedure Laterality Date   ABDOMINAL HYSTERECTOMY  1997   BREAST EXCISIONAL BIOPSY Right 07/27/2006   Intraductal Papilloma   BREAST SURGERY  2008   CATARACT EXTRACTION Bilateral 02/2020   other in 05/2020   COLONOSCOPY  2010   Dr Kristie   EYE SURGERY  2022   HYSTEROSCOPY  1993   IR FLUORO GUIDE CV LINE RIGHT  06/24/2023   IR REMOVAL TUN CV CATH W/O FL  07/04/2023   IR US  GUIDE VASC ACCESS RIGHT  06/24/2023   LAPAROSCOPIC HYSTERECTOMY  1994   MYOMECTOMY  1993   excision of tumor   POLYPECTOMY     TONSILLECTOMY  1953   TUBAL LIGATION  1980   Social History:  reports that she has never smoked. She has never used smokeless tobacco. She reports current alcohol use of about 5.0 standard drinks of alcohol per week. She reports that she does not use drugs.  Family / Support Systems Marital Status: Married Patient Roles: Spouse, Parent Spouse/Significant OtherBETHA Orozco [phone] Children: Marissa Orozco-daughter [phone] local and another daughter in [place] Other Supports: Friends Anticipated Caregiver: husband Ability/Limitations of Caregiver: NA cycles 80 miles per day according to pt Caregiver Availability: 24/7 Family Dynamics: Close knit with two  daughters who will be checking on them and making sure pt's needs are being met.  Social History Preferred language: English Religion:  Cultural Background: NA Education: HS Health Literacy - How often do you need to have someone help you when you read instructions, pamphlets, or other written material from your doctor or pharmacy?: Never Writes: Yes Employment Status: Retired Marine scientist Issues: NA Guardian/Conservator: None-according to MD pt is capable of making her own decisions while here   Abuse/Neglect Abuse/Neglect Assessment Can Be Completed: Yes Physical Abuse: Denies Verbal Abuse: Denies Sexual Abuse: Denies Exploitation of patient/patient's resources: Denies Self-Neglect: Denies  Patient response to: Social Isolation - How often do you feel lonely or isolated from those around you?: Never  Emotional Status Pt's affect, behavior and adjustment status: Pt is motivated to do well and recover from this hospitlaization. She is doing better but is weak due to lenght of being here and all of the tests done while here. She has always been able to take care of herself and wants to again Recent Psychosocial Issues: other health issues Psychiatric History: No history-issues seems to be coping appropriately and able ot verbalize her concerns and feelings. Substance Abuse History: NA  Patient / Family Perceptions, Expectations & Goals Pt/Family understanding of illness & functional limitations: Pt and daughter who is present can explain her health issues and procedures while here. Have ofund out has autoimmune issue. She likes to stay informed of her  medical issues and does ask questions Premorbid pt/family roles/activities: wife, mom, retiree, church member, etc Anticipated changes in roles/activities/participation: resume Pt/family expectations/goals: Pt states:   I want to recover and be independent again like I was before all of this.  daughter states:  She is  not one to ask for help she is very independent.  Community [employer] Agencies: None Premorbid Home Care/DME Agencies: Other (Comment) (tub seat) Transportation available at discharge: husband Is the patient able to respond to transportation needs?: Yes In the past 12 months, has lack of transportation kept you from medical appointments or from getting medications?: No In the past 12 months, has lack of transportation kept you from meetings, work, or from getting things needed for daily living?: No  Discharge Planning Living Arrangements: Spouse/significant other Support Systems: Spouse/significant other, Children, Friends/neighbors, Church/faith community Type of Residence: Private residence Insurance Resources: [employer] Financial Resources: Social Security, Family Support Financial Screen Referred: No Living Expenses: Own Money Management: Patient, Spouse Does the patient have any problems obtaining your medications?: No Home Management: both Patient/Family Preliminary Plans: Return home with husband who is able to assist if needed. He is in good health and can assist her does exercise daily and is gone for a few hours. Aware will be evaluated tomorrow and oals set for stay here. Care Coordinator Anticipated Follow Up Needs: HH/OP  Clinical Impression Pleasant female who is motivated to do well and recover from this heath issues and learn how to manage it. Their daughter's is involved and supportive. Main caregiver will be her husband, if needed. Will work on discharge need and await team's evaluations  Maryetta Shafer, [place] MATSU 07/07/2023, 2:08 PM

## 2023-07-07 NOTE — Progress Notes (Signed)
 Patient discharged to rehab: - Continue prednisone 40mg  / day for one week then slow taper by 5mg  / week until done - will need LFTs checked next Monday to ensure appropriate downtrend - I will discuss her case with Dr. Wilhelmenia, she warrants EUS to further evaluate her biliary tree and pancreatic cyst. Will coordinate and potentially may be able to do while she is in rehab at [facility]  Call with questions.  Marcey Naval, MD [facility]

## 2023-07-07 NOTE — Progress Notes (Signed)
 Inpatient Rehabilitation Admissions Coordinator   I have CIR bed to admit her to today. I met with patient and her daughter at bedside and they are in agreement. Acute team and TOC made aware. Dr Dino and Dr Leigh aware. I will make the arrangements.  Heron Leavell, RN, MSN Rehab Admissions Coordinator [phone] 07/07/2023 10:09 AM

## 2023-07-08 DIAGNOSIS — G7281 Critical illness myopathy: Secondary | ICD-10-CM | POA: Diagnosis not present

## 2023-07-08 LAB — CBC WITH DIFFERENTIAL/PLATELET
Abs Immature Granulocytes: 0.15 K/uL — ABNORMAL HIGH (ref 0.00–0.07)
Basophils Absolute: 0.1 K/uL (ref 0.0–0.1)
Basophils Relative: 0 %
Eosinophils Absolute: 0.1 K/uL (ref 0.0–0.5)
Eosinophils Relative: 0 %
HCT: 36.4 % (ref 36.0–46.0)
Hemoglobin: 12.5 g/dL (ref 12.0–15.0)
Immature Granulocytes: 1 %
Lymphocytes Relative: 20 %
Lymphs Abs: 3.8 K/uL (ref 0.7–4.0)
MCH: 31.4 pg (ref 26.0–34.0)
MCHC: 34.3 g/dL (ref 30.0–36.0)
MCV: 91.5 fL (ref 80.0–100.0)
Monocytes Absolute: 1.8 K/uL — ABNORMAL HIGH (ref 0.1–1.0)
Monocytes Relative: 9 %
Neutro Abs: 13.5 K/uL — ABNORMAL HIGH (ref 1.7–7.7)
Neutrophils Relative %: 70 %
Platelets: 157 K/uL (ref 150–400)
RBC: 3.98 MIL/uL (ref 3.87–5.11)
RDW: 23.6 % — ABNORMAL HIGH (ref 11.5–15.5)
WBC: 19.3 K/uL — ABNORMAL HIGH (ref 4.0–10.5)
nRBC: 0 % (ref 0.0–0.2)

## 2023-07-08 LAB — GLUCOSE, CAPILLARY
Glucose-Capillary: 69 mg/dL — ABNORMAL LOW (ref 70–99)
Glucose-Capillary: 109 mg/dL — ABNORMAL HIGH (ref 70–99)
Glucose-Capillary: 183 mg/dL — ABNORMAL HIGH (ref 70–99)
Glucose-Capillary: 169 mg/dL — ABNORMAL HIGH (ref 70–99)

## 2023-07-08 LAB — BASIC METABOLIC PANEL WITH GFR
Anion gap: 5 (ref 5–15)
BUN: 41 mg/dL — ABNORMAL HIGH (ref 8–23)
CO2: 20 mmol/L — ABNORMAL LOW (ref 22–32)
Calcium: 8.1 mg/dL — ABNORMAL LOW (ref 8.9–10.3)
Chloride: 115 mmol/L — ABNORMAL HIGH (ref 98–111)
Creatinine, Ser: 1.57 mg/dL — ABNORMAL HIGH (ref 0.44–1.00)
GFR, Estimated: 33 mL/min — ABNORMAL LOW (ref >60)
Glucose, Bld: 82 mg/dL (ref 70–99)
Potassium: 3.5 mmol/L (ref 3.5–5.1)
Sodium: 140 mmol/L (ref 135–145)

## 2023-07-08 NOTE — Evaluation (Signed)
 Occupational Therapy Assessment and Plan  Patient Details  Name: Marissa Orozco MRN: [id number] Date of Birth: 08/14/1945  OT Diagnosis: muscle weakness (generalized), decreased activity tolerance, and decreased balance strategies Rehab Potential: Rehab Potential (ACUTE ONLY): Good ELOS: 7-10 days   Today's Date: 07/08/2023 OT Individual Time: 9197-9084 OT Individual Time Calculation (min): 73 min     Hospital Problem: Principal Problem:   Intensive care (ICU) myopathy Active Problems:   AKI (acute kidney injury) (HCC)   Past Medical History:  Past Medical History:  Diagnosis Date   Allergy 2000   Breast lump    Cataract    Colon polyps    Fibroids    GERD (gastroesophageal reflux disease)    Glaucoma    Heart murmur    Hyperlipidemia 01/05/1984   Hypertension 01/05/1984   Interstitial cystitis 01/04/1997   Kidney stones 01/05/2003   Neuromuscular disorder (HCC)    Osteoporosis    Preop cardiovascular exam 10/25/2022   Sleep apnea 2021   Tonsillitis    Past Surgical History:  Past Surgical History:  Procedure Laterality Date   ABDOMINAL HYSTERECTOMY  1997   BREAST EXCISIONAL BIOPSY Right 07/27/2006   Intraductal Papilloma   BREAST SURGERY  2008   CATARACT EXTRACTION Bilateral 02/2020   other in 05/2020   COLONOSCOPY  2010   Dr Kristie   EYE SURGERY  2022   HYSTEROSCOPY  1993   IR FLUORO GUIDE CV LINE RIGHT  06/24/2023   IR REMOVAL TUN CV CATH W/O FL  07/04/2023   IR US  GUIDE VASC ACCESS RIGHT  06/24/2023   LAPAROSCOPIC HYSTERECTOMY  1994   MYOMECTOMY  1993   excision of tumor   POLYPECTOMY     TONSILLECTOMY  1953   TUBAL LIGATION  1980    Assessment & Plan Clinical Impression: Patient is a 78 year old L handed female with history of CVA, HTN, HLD, glaucoma, and hx of kidney stones presented to [facility] on 06/17/2023 with complaints of low blood pressures, persistent fatigue, and poor intake for several days otherwise no other symptoms. Per her daughter,  she was working out doing yoga/pilates days prior to admission  Admission chemistries BUN 30 and cr 2.79.  UTI positive for E. coli treated with ABX.  She was admitted to [facility] on 06/17/23 for AKI and treated with IVF.  Nephrology consulted and she was transferred to [facility] on 06/23/2023 d/t to concerns for glomerulonephritis and rising BUN/CR. IR consulted placing a right IJ TDC on 06/25/23. She received 1 round of HD.  No BX revealed chronic changes, AIN and some TMA but no immune complex mediated or crescentic glomerulonephritis noted.  On 6/26 UOP and Scr improved and dialysis catheter removed 07/04/23.    GI consulted due to  significantly elevated LFTs. US  of RUQ showed fatty liver w/o focal abnormality. There were incidental findings of tranaminitis/bilirubinemia and a pancreatic cyst measuring 5.2 cm x 4.6 cm and CA 19-9 was elevated to 139. 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 Recommendations to start prednisone  taper (started 07/06/23) close monitoring of LFTs, trend IgG and an outpatient referral to hepatology for EUS.  TMPT results are still pending. Prior to arrival patient was on amlodipine  and atorvastatin  for hypertension but remains off of ARB in the setting of AKI and noted hypotension.    Per chart review patient was independent without need for AD, retired and drives.  Participates in aerobics 3 times  weekly and lives in two level home.  Currently requiring min-mod assist with mobility.  Min to max assist with ADLs and minimal assistance using RW. Therapy evaluations completed due to patient decreased functional mobility was admitted for a comprehensive rehab program. Patient transferred to CIR on 07/07/2023 .    Patient currently requires CGA-Min A with basic self-care skills secondary to muscle weakness, decreased cardiorespiratoy endurance, and decreased standing balance and decreased balance  strategies.  Prior to hospitalization, patient could complete BADLs/IADLs independently.   Patient will benefit from skilled intervention to increase independence with basic self-care skills and increase level of independence with iADL prior to discharge home with care partner.  Anticipate patient will require intermittent supervision and no further OT follow recommended.  OT - End of Session Activity Tolerance: Tolerates 30+ min activity without fatigue Endurance Deficit: Yes OT Assessment Rehab Potential (ACUTE ONLY): Good OT Barriers to Discharge: Other (comments) (Decreased activity tolerance) OT Patient demonstrates impairments in the following area(s): Balance;Endurance OT Basic ADL's Functional Problem(s): Bathing;Dressing;Toileting OT Advanced ADL's Functional Problem(s): Simple Meal Preparation;Light Housekeeping OT Transfers Functional Problem(s): Toilet;Tub/Shower OT Plan OT Intensity: Minimum of 1-2 x/day, 45 to 90 minutes OT Frequency: 5 out of 7 days OT Duration/Estimated Length of Stay: 7-10 days OT Treatment/Interventions: Balance/vestibular training;Community reintegration;Discharge planning;Disease mangement/prevention;DME/adaptive equipment instruction;Functional mobility training;Neuromuscular re-education;Patient/family education;Psychosocial support;Self Care/advanced ADL retraining;Therapeutic Activities;Therapeutic Exercise;UE/LE Strength taining/ROM OT Basic Self-Care Anticipated Outcome(s): Mod I OT Toileting Anticipated Outcome(s): Mod I OT Bathroom Transfers Anticipated Outcome(s): Mod I OT Recommendation Patient destination: Home Follow Up Recommendations: None Equipment Recommended: To be determined   OT Evaluation Precautions/Restrictions  Precautions Precautions: Fall Restrictions Weight Bearing Restrictions Per Provider Order: No General Chart Reviewed: Yes Family/Caregiver Present: No Pain Pain Assessment Pain Scale: 0-10 Pain Score: 0-No  pain Home Living/Prior Functioning Home Living Living Arrangements: Spouse/significant other Available Help at Discharge: Family, Available 24 hours/day Type of Home: House Home Access: Stairs to enter [employer] of Steps: 4 Entrance Stairs-Rails: Can reach both Home Layout: Two level, Bed/bath upstairs Alternate Level Stairs-Number of Steps: Flight Alternate Level Stairs-Rails: Can reach both Bathroom Shower/Tub: Walk-in shower (Built-in shower seat and detachable shower head.) Teacher, early years/pre: Yes  Lives With: Spouse IADL History Homemaking Responsibilities: Yes Meal Prep Responsibility: Primary Laundry Responsibility: Primary Cleaning Responsibility: Primary Current License: Yes Mode of Transportation: Car Occupation: Retired Advertising account planner: Attends aerobics classes 3x/week Prior Function Level of Independence: Independent with basic ADLs, Independent with homemaking with ambulation, Independent with gait, Independent with transfers Driving: Yes Vocation: Retired Administrator, sports Baseline Vision/History: 1 Wears glasses (Glasses for driving) Ability to See in Adequate Light: 0 Adequate Patient Visual Report: No change from baseline Vision Assessment?: No apparent visual deficits Perception  Perception: Within Functional Limits Praxis Praxis: WFL Cognition Cognition Overall Cognitive Status: Within Functional Limits for tasks assessed Arousal/Alertness: Awake/alert Orientation Level: Place;Person;Situation Memory: Appears intact Awareness: Appears intact Problem Solving: Appears intact Safety/Judgment: Appears intact Brief Interview for Mental Status (BIMS) Repetition of Three Words (First Attempt): 3 Temporal Orientation: Year: Correct Temporal Orientation: Month: Accurate within 5 days Temporal Orientation: Day: Correct Recall: Sock: Yes, no cue required Recall: Blue: Yes, no cue required Recall: Bed: Yes, no cue  required BIMS Summary Score: 15 Sensation Sensation Light Touch: Appears Intact Hot/Cold: Appears Intact Proprioception: Appears Intact Coordination Gross Motor Movements are Fluid and Coordinated: No Fine Motor Movements are Fluid and Coordinated: Yes Coordination and Movement Description: Deficits due to generalized weakness and decreased activity tolerance. Motor  Motor  Motor: Other (comment) Motor - Skilled Clinical Observations: Deficits due to generalized weakness and decreased activity tolerance.  Trunk/Postural Assessment  Cervical Assessment Cervical Assessment: Within Functional Limits Thoracic Assessment Thoracic Assessment: Within Functional Limits Lumbar Assessment Lumbar Assessment: Within Functional Limits Postural Control Postural Control: Deficits on evaluation Righting Reactions: Decreased/Delayed Protective Responses: Decreased/Delayed  Balance Balance Balance Assessed: Yes Static Sitting Balance Static Sitting - Balance Support: Feet supported Static Sitting - Level of Assistance: 7: Independent Dynamic Sitting Balance Dynamic Sitting - Balance Support: During functional activity Dynamic Sitting - Level of Assistance: 6: Modified independent (Device/Increase time) Static Standing Balance Static Standing - Balance Support: During functional activity;No upper extremity supported Static Standing - Level of Assistance: 5: Stand by assistance (CGA) Dynamic Standing Balance Dynamic Standing - Balance Support: Right upper extremity supported Dynamic Standing - Level of Assistance: 5: Stand by assistance;4: Min assist (CGA-Min A) Extremity/Trunk Assessment RUE Assessment RUE Assessment: Within Functional Limits LUE Assessment LUE Assessment: Within Functional Limits  Care Tool Care Tool Self Care Eating   Eating Assist Level: Independent    Oral Care    Oral Care Assist Level: Independent    Bathing   Body parts bathed by patient: Right arm;Left  arm;Chest;Abdomen;Front perineal area;Buttocks;Right upper leg;Left upper leg;Right lower leg;Left lower leg;Face     Assist Level: Contact Guard/Touching assist    Upper Body Dressing(including orthotics)   What is the patient wearing?: Pull over shirt;Bra   Assist Level: Set up assist    Lower Body Dressing (excluding footwear)   What is the patient wearing?: Underwear/pull up;Pants Assist for lower body dressing: Contact Guard/Touching assist    Putting on/Taking off footwear   What is the patient wearing?: Shoes Assist for footwear: Set up assist       Care Tool Toileting Toileting activity   Assist for toileting: Contact Guard/Touching assist     Care Tool Bed Mobility Roll left and right activity        Sit to lying activity        Lying to sitting on side of bed activity         Care Tool Transfers Sit to stand transfer        Chair/bed transfer         Toilet transfer   Assist Level: Contact Guard/Touching assist     Care Tool Cognition  Expression of Ideas and Wants Expression of Ideas and Wants: 4. Without difficulty (complex and basic) - expresses complex messages without difficulty and with speech that is clear and easy to understand  Understanding Verbal and Non-Verbal Content Understanding Verbal and Non-Verbal Content: 4. Understands (complex and basic) - clear comprehension without cues or repetitions   Memory/Recall Ability Memory/Recall Ability : Current season;That he or she is in a hospital/hospital unit   Refer to Care Plan for Long Term Goals  SHORT TERM GOAL WEEK 1 OT Short Term Goal 1 (Week 1): STGs=LTGs due to patient's estimated length of stay.  Recommendations for other services: None    Skilled Therapeutic Intervention  Session began with introduction to OT role, OT POC, and general orientation to rehab unit/schedule. Pt completes full-body bathing/dressing with levels of assistance noted below within shower context. Pt  requires Min A (HHA) for walk-in shower transfer, CGA + RW to exit for energy conservation. Pt remained resting in bed with all immediate needs met.   ADL ADL Eating: Independent Where Assessed-Eating: Wheelchair Grooming: Modified independent Where Assessed-Grooming: Sitting at sink Upper Body Bathing: Supervision/safety;Setup Where  Assessed-Upper Body Bathing: Public affairs consultant Bathing: Contact guard Where Assessed-Lower Body Bathing: Water quality scientist Dressing: Supervision/safety;Setup Where Assessed-Upper Body Dressing: Wheelchair Lower Body Dressing: Contact guard Where Assessed-Lower Body Dressing: Wheelchair Toileting: Contact guard;Minimal assistance Where Assessed-Toileting: Teacher, adult education: Scientific laboratory technician Method: Proofreader: Engineer, manufacturing systems Transfer: Scientist, forensic Method: Designer, industrial/product: Transfer tub bench;Grab bars ADL Comments: Functional transfers completed with AD, otherwise supervision for mobility with use of RW. Mobility  Transfers Sit to Stand: Contact Guard/Touching assist;Minimal Assistance - Patient > 75% Stand to Sit: Contact Guard/Touching assist;Minimal Assistance - Patient > 75%   Discharge Criteria: Patient will be discharged from OT if patient refuses treatment 3 consecutive times without medical reason, if treatment goals not met, if there is a change in medical status, if patient makes no progress towards goals or if patient is discharged from hospital.  The above assessment, treatment plan, treatment alternatives and goals were discussed and mutually agreed upon: by patient  Nereida Habermann, OTR/L, MSOT  07/08/2023, 8:56 AM

## 2023-07-08 NOTE — Plan of Care (Signed)
  Problem: RH Balance Goal: LTG Patient will maintain dynamic standing balance (PT) Description: LTG:  Patient will maintain dynamic standing balance with assistance during mobility activities (PT) Flowsheets (Taken 07/08/2023 1249) LTG: Pt will maintain dynamic standing balance during mobility activities with:: Independent with assistive device    Problem: Sit to Stand Goal: LTG:  Patient will perform sit to stand with assistance level (PT) Description: LTG:  Patient will perform sit to stand with assistance level (PT) Flowsheets (Taken 07/08/2023 1249) LTG: PT will perform sit to stand in preparation for functional mobility with assistance level: Independent with assistive device   Problem: RH Bed Mobility Goal: LTG Patient will perform bed mobility with assist (PT) Description: LTG: Patient will perform bed mobility with assistance, with/without cues (PT). Flowsheets (Taken 07/08/2023 1249) LTG: Pt will perform bed mobility with assistance level of: Independent with assistive device    Problem: RH Bed to Chair Transfers Goal: LTG Patient will perform bed/chair transfers w/assist (PT) Description: LTG: Patient will perform bed to chair transfers with assistance (PT). Flowsheets (Taken 07/08/2023 1249) LTG: Pt will perform Bed to Chair Transfers with assistance level: Independent with assistive device    Problem: RH Car Transfers Goal: LTG Patient will perform car transfers with assist (PT) Description: LTG: Patient will perform car transfers with assistance (PT). Flowsheets (Taken 07/08/2023 1249) LTG: Pt will perform car transfers with assist:: Independent with assistive device    Problem: RH Ambulation Goal: LTG Patient will ambulate in controlled environment (PT) Description: LTG: Patient will ambulate in a controlled environment, # of feet with assistance (PT). Flowsheets (Taken 07/08/2023 1249) LTG: Pt will ambulate in controlled environ  assist needed:: Independent with assistive  device LTG: Ambulation distance in controlled environment: 150' Goal: LTG Patient will ambulate in home environment (PT) Description: LTG: Patient will ambulate in home environment, # of feet with assistance (PT). Flowsheets (Taken 07/08/2023 1249) LTG: Pt will ambulate in home environ  assist needed:: Independent with assistive device LTG: Ambulation distance in home environment: 50'   Problem: RH Stairs Goal: LTG Patient will ambulate up and down stairs w/assist (PT) Description: LTG: Patient will ambulate up and down # of stairs with assistance (PT) Flowsheets (Taken 07/08/2023 1249) LTG: Pt will ambulate up/down stairs assist needed:: Independent with assistive device LTG: Pt will  ambulate up and down number of stairs: 12 with BHRs per home set up

## 2023-07-08 NOTE — Plan of Care (Signed)
  Problem: RH Balance Goal: LTG Patient will maintain dynamic standing with ADLs (OT) Description: LTG:  Patient will maintain dynamic standing balance with assist during activities of daily living (OT)  Flowsheets (Taken 07/08/2023 0921) LTG: Pt will maintain dynamic standing balance during ADLs with: Independent with assistive device   Problem: RH Bathing Goal: LTG Patient will bathe all body parts with assist levels (OT) Description: LTG: Patient will bathe all body parts with assist levels (OT) Flowsheets (Taken 07/08/2023 0921) LTG: Pt will perform bathing with assistance level/cueing: Independent with assistive device    Problem: RH Dressing Goal: LTG Patient will perform lower body dressing w/assist (OT) Description: LTG: Patient will perform lower body dressing with assist, with/without cues in positioning using equipment (OT) Flowsheets (Taken 07/08/2023 0921) LTG: Pt will perform lower body dressing with assistance level of: Independent with assistive device   Problem: RH Toileting Goal: LTG Patient will perform toileting task (3/3 steps) with assistance level (OT) Description: LTG: Patient will perform toileting task (3/3 steps) with assistance level (OT)  Flowsheets (Taken 07/08/2023 0921) LTG: Pt will perform toileting task (3/3 steps) with assistance level: Independent with assistive device   Problem: RH Simple Meal Prep Goal: LTG Patient will perform simple meal prep w/assist (OT) Description: LTG: Patient will perform simple meal prep with assistance, with/without cues (OT). Flowsheets (Taken 07/08/2023 0921) LTG: Pt will perform simple meal prep with assistance level of: Independent with assistive device   Problem: RH Light Housekeeping Goal: LTG Patient will perform light housekeeping w/assist (OT) Description: LTG: Patient will perform light housekeeping with assistance, with/without cues (OT). Flowsheets (Taken 07/08/2023 0921) LTG: Pt will perform light housekeeping with  assistance level of: Independent with assistive device   Problem: RH Toilet Transfers Goal: LTG Patient will perform toilet transfers w/assist (OT) Description: LTG: Patient will perform toilet transfers with assist, with/without cues using equipment (OT) Flowsheets (Taken 07/08/2023 0921) LTG: Pt will perform toilet transfers with assistance level of: Independent with assistive device   Problem: RH Tub/Shower Transfers Goal: LTG Patient will perform tub/shower transfers w/assist (OT) Description: LTG: Patient will perform tub/shower transfers with assist, with/without cues using equipment (OT) Flowsheets (Taken 07/08/2023 0921) LTG: Pt will perform tub/shower stall transfers with assistance level of: Independent with assistive device

## 2023-07-08 NOTE — Progress Notes (Signed)
 PROGRESS NOTE   Subjective/Complaints:  Pt denies pain.   Had a BG of 69 this AM but didn't feel bad.  Admits she's avoiding fluids- which is evident-by increase in BUN.   D/w team- will increase fluid restriction to 1800cc.   LBM yesterday.   ROS:  Pt denies SOB, abd pain, CP, N/V/C/D, and vision changes   Objective:   No results found. Recent Labs    07/07/23 1407 07/08/23 0431  WBC 19.1* 19.3*  HGB 14.2 12.5  HCT 41.2 36.4  PLT 179 157   Recent Labs    07/07/23 0447 07/08/23 0431  NA 138 140  K 3.3* 3.5  CL 113* 115*  CO2 18* 20*  GLUCOSE 145* 82  BUN 38* 41*  CREATININE 1.64* 1.57*  CALCIUM  8.3* 8.1*    Intake/Output Summary (Last 24 hours) at 07/08/2023 0919 Last data filed at 07/08/2023 0700 Gross per 24 hour  Intake 354 ml  Output 200 ml  Net 154 ml        Physical Exam: Vital Signs Blood pressure 114/74, pulse (!) 57, temperature 97.9 F (36.6 C), resp. rate 18, height 5' 3.5 (1.613 m), weight 79 kg, SpO2 99%.     General: awake, alert, appropriate, sitting up in w/c- flat affect;  NAD HENT: conjugate gaze; oropharynx moist CV: regular rate and rhythm; no JVD Pulmonary: CTA B/L; no W/R/R- good air movement GI: soft, NT, ND, (+)BS Psychiatric: appropriate but flat Neurological: Ox3 Musculoskeletal:     Cervical back: Normal range of motion and neck supple.     Comments: Ue's- Deltoids 4-/5 otherwise 5-/5 throughout in Ue's LE's- RLE- HF 2+/5; 4+/5 otherwise LLE- HF 2-/5 KE/KF/DF/PF all 4+/5 otherwise   Skin:    General: Skin is warm and dry.     Capillary Refill: Capillary refill takes less than 2 seconds.     Coloration: Skin is pale.     Comments: No skin breakdown seen  Neurological:     Mental Status: She is alert.     Sensory: No sensory deficit.     Comments: Intact to light touch in all 4 extremities Voice OK, but sounds somewhat weak Ox3 and appropriate- but  slightly delayed responses Assessment/Plan: 1. Functional deficits which require 3+ hours per day of interdisciplinary therapy in a comprehensive inpatient rehab setting. Physiatrist is providing close team supervision and 24 hour management of active medical problems listed below. Physiatrist and rehab team continue to assess barriers to discharge/monitor patient progress toward functional and medical goals  Care Tool:  Bathing    Body parts bathed by patient: Right arm, Left arm, Chest, Abdomen, Front perineal area, Buttocks, Right upper leg, Left upper leg, Right lower leg, Left lower leg, Face         Bathing assist Assist Level: Contact Guard/Touching assist     Upper Body Dressing/Undressing Upper body dressing   What is the patient wearing?: Pull over shirt, Bra    Upper body assist Assist Level: Set up assist    Lower Body Dressing/Undressing Lower body dressing      What is the patient wearing?: Underwear/pull up, Pants     Lower body assist Assist  for lower body dressing: Contact Guard/Touching assist     Toileting Toileting    Toileting assist Assist for toileting: Contact Guard/Touching assist     Transfers Chair/bed transfer  Transfers assist           Locomotion Ambulation   Ambulation assist              Walk 10 feet activity   Assist           Walk 50 feet activity   Assist           Walk 150 feet activity   Assist           Walk 10 feet on uneven surface  activity   Assist           Wheelchair     Assist               Wheelchair 50 feet with 2 turns activity    Assist            Wheelchair 150 feet activity     Assist          Blood pressure 114/74, pulse (!) 57, temperature 97.9 F (36.6 C), resp. rate 18, height 5' 3.5 (1.613 m), weight 79 kg, SpO2 99%.  Medical Problem List and Plan: 1. 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  Antithrombotics: -DVT/anticoagulation:  Mechanical: Sequential compression devices, below knee Bilateral lower extremities             -antiplatelet therapy: resume ASA 81 mg 7/4 3. Pain Management: No meds ordered- pt denies pain- cannot have tylenol  or NSAIDs due to LFT elevation/autoimmune hepatitis  4. Mood/Behavior/Sleep: LCSW to follow for evaluation and support when available.              -antipsychotic agents: N/A 5. Neuropsych/cognition: This patient is capable of making decisions on her own behalf. 6. Skin/Wound Care: Routine pressure relief measures  7. Fluids/Electrolytes/Nutrition: CMET in a.m   -Daily weights- Strict I&O              -Renal w/ 1200 fluid restrict              -Severe protien calorie malnutrition: add Glucerna - Alb 1.6- concern that it's due to her hepatitis- will monitor -recheck next Monday             -Vitamin C               -Constipation- LBM 7/2 reports hard pebble like stools-will add Senna-S 1 tab/day 8. Hypertension: holding home amlodipine   -Continue Lasix  40 mg P.O twice daily              - CTM BPs closely 9. Hypokalemia: K+ now 3.3 Goal K+ > 4.0 and Mag > 2.0.  -continue potassium 20 meq daily              -will recheck levels in AM  7/4- K+ 3.5 this AM- might need to increase to 40 mEq 10. Transaminitis/Elevated Liver Enzyme due to Autoimmune hepatitis/pancreatic cyst/dilated bile duct: Monitor Hepatic panel + CBC w/diff -Repeat LFT's  on 7/7 and 7/10 per GI  -Prednisone  40 mg started 07/06/23- Taper by 5 mg/week until done.              TMPT-pending may need to start Thiopurine if normal  -will need ASAP f/u with GI and hepatology after d/c- GI will attempt to work up pancreatic cyst and bile duct dilation while here.  11. Hyperlipidemia: continue holding atorvastatin  due to  abnormal LFTs. 12. Glaucoma: Continue Dorzolamide -Timolol  and Latanoprost   13. Pre-Diabetes: A1c 6.2. now on prednisone               -monitor BS ac/hs and use SSI for elevated BS due to high doses of Prednisone   7/4- BG dropped to 69 overnight- but will order a night snack to help 14.  Class I obesity: Counsel on diet modifications and exercise to facilitate weight loss. 15. Leukocytosis- just started Prednisone  today- I'm not sure would cause WBC to go to 19k from 10.9k so fast- will recheck in AM and monitor clinically for possible infection  7/4- WBC 19.3- since not still going up, I think more likely from Prednisone - just started after 1 dose of Prednisone -  16. Thrombocytopenia- Plts dropped to 107k today from 155k- will recheck in AM- if better, will check Monday/Thursdays- to reduce lab draws  7/4- Plt sup to 157k  17. ARF s/p E Coli- with improving renal function  7/4- Cr down to 1.57 and BUN slightly up to 41 (not drinking well)  -encouraged pt to drink more- also will make fluid restriction 1800cc now    I spent a total of 45   minutes on total care today- >50% coordination of care- due to  D/w PA and NP -also d/w pharmacy and review of labs, vitals and CBGs-   LOS: 1 days A FACE TO FACE EVALUATION WAS PERFORMED  Reade Trefz 07/08/2023, 9:19 AM

## 2023-07-08 NOTE — Evaluation (Addendum)
 Physical Therapy Assessment and Plan  Patient Details  Name: Marissa Orozco MRN: [id number] Date of Birth: Aug 30, 1945  PT Diagnosis: Abnormality of gait, Difficulty walking, and Muscle weakness Rehab Potential: Good ELOS: 7-10 days   Today's Date: 07/08/2023 PT Individual Time: 1110-1204 + [id number] PT Individual Time Calculation (min): 54 min + 53 min  Hospital Problem: Principal Problem:   Intensive care (ICU) myopathy Active Problems:   AKI (acute kidney injury) (HCC)   Past Medical History:  Past Medical History:  Diagnosis Date   Allergy 2000   Breast lump    Cataract    Colon polyps    Fibroids    GERD (gastroesophageal reflux disease)    Glaucoma    Heart murmur    Hyperlipidemia 01/05/1984   Hypertension 01/05/1984   Interstitial cystitis 01/04/1997   Kidney stones 01/05/2003   Neuromuscular disorder (HCC)    Osteoporosis    Preop cardiovascular exam 10/25/2022   Sleep apnea 2021   Tonsillitis    Past Surgical History:  Past Surgical History:  Procedure Laterality Date   ABDOMINAL HYSTERECTOMY  1997   BREAST EXCISIONAL BIOPSY Right 07/27/2006   Intraductal Papilloma   BREAST SURGERY  2008   CATARACT EXTRACTION Bilateral 02/2020   other in 05/2020   COLONOSCOPY  2010   Dr Kristie   EYE SURGERY  2022   HYSTEROSCOPY  1993   IR FLUORO GUIDE CV LINE RIGHT  06/24/2023   IR REMOVAL TUN CV CATH W/O FL  07/04/2023   IR US  GUIDE VASC ACCESS RIGHT  06/24/2023   LAPAROSCOPIC HYSTERECTOMY  1994   MYOMECTOMY  1993   excision of tumor   POLYPECTOMY     TONSILLECTOMY  1953   TUBAL LIGATION  1980    Assessment & Plan Clinical Impression: Patient is a 78 year old L handed female with history of CVA, HTN, HLD, glaucoma, and hx of kidney stones presented to [facility] on 06/17/2023 with complaints of low blood pressures, persistent fatigue, and poor intake for several days otherwise no other symptoms. Per her daughter, she was working out doing yoga/pilates days prior  to admission  Admission chemistries BUN 30 and cr 2.79.  UTI positive for E. coli treated with ABX.  She was admitted to [facility] on 06/17/23 for AKI and treated with IVF.  Nephrology consulted and she was transferred to [facility] on 06/23/2023 d/t to concerns for glomerulonephritis and rising BUN/CR. IR consulted placing a right IJ TDC on 06/25/23. She received 1 round of HD.  No BX revealed chronic changes, AIN and some TMA but no immune complex mediated or crescentic glomerulonephritis noted.  On 6/26 UOP and Scr improved and dialysis catheter removed 07/04/23.    GI consulted due to  significantly elevated LFTs. US  of RUQ showed fatty liver w/o focal abnormality. There were incidental findings of tranaminitis/bilirubinemia and a pancreatic cyst measuring 5.2 cm x 4.6 cm and CA 19-9 was elevated to 139. 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 Recommendations to start prednisone  taper (started 07/06/23) close monitoring of LFTs, trend IgG and an outpatient referral to hepatology for EUS.  TMPT results are still pending. Prior to arrival patient was on amlodipine  and atorvastatin  for hypertension but remains off of ARB in the setting of AKI and noted hypotension.    Per chart review patient was independent without need for AD, retired and drives.  Participates in aerobics 3 times weekly and lives in  two level home.  Currently requiring min-mod assist with mobility.  Min to max assist with ADLs and minimal assistance using RW. Therapy evaluations completed due to patient decreased functional mobility was admitted for a comprehensive rehab program.     Patient currently requires min with mobility secondary to muscle weakness, decreased cardiorespiratoy endurance, and decreased standing balance, decreased postural control, and decreased balance strategies.  Prior to hospitalization, patient was independent  with mobility  and lived with Spouse in a House home.  Home access is 4Stairs to enter.  Patient will benefit from skilled PT intervention to maximize safe functional mobility, minimize fall risk, and decrease caregiver burden for planned discharge home with intermittent assist.  Anticipate patient will benefit from follow up OP at discharge.  PT - End of Session Activity Tolerance: Tolerates 30+ min activity with multiple rests Endurance Deficit: Yes Endurance Deficit Description: rest breaks with all mobility tasks PT Assessment Rehab Potential (ACUTE/IP ONLY): Good PT Barriers to Discharge: Inaccessible home environment;Home environment access/layout PT Barriers to Discharge Comments: 4 STE, full flight of stairs to bed/bath PT Patient demonstrates impairments in the following area(s): Endurance;Balance;Safety PT Transfers Functional Problem(s): Bed Mobility;Bed to Chair;Car;Furniture PT Locomotion Functional Problem(s): Stairs;Ambulation PT Plan PT Intensity: Minimum of 1-2 x/day ,45 to 90 minutes PT Frequency: 5 out of 7 days PT Duration Estimated Length of Stay: 7-10 days PT Treatment/Interventions: Ambulation/gait training;Pain management;Disease management/prevention;Stair training;Balance/vestibular training;DME/adaptive equipment instruction;Patient/family education;Therapeutic Activities;Psychosocial support;Therapeutic Exercise;Community reintegration;Functional mobility training;UE/LE Strength taining/ROM;Discharge planning;Neuromuscular re-education;UE/LE Coordination activities PT Recommendation Recommendations for Other Services: None Follow Up Recommendations: Outpatient PT Patient destination: Home Equipment Recommended: To be determined   PT Evaluation Precautions/Restrictions Precautions Precautions: Fall Recall of Precautions/Restrictions: Intact Restrictions Weight Bearing Restrictions Per Provider Order: No Pain Interference Pain Interference Pain Effect on Sleep: 1.  Rarely or not at all Pain Interference with Therapy Activities: 1. Rarely or not at all Pain Interference with Day-to-Day Activities: 1. Rarely or not at all Home Living/Prior Functioning Home Living Available Help at Discharge: Family;Available 24 hours/day Type of Home: House Home Access: Stairs to enter [employer] of Steps: 4 Entrance Stairs-Rails: Can reach both Home Layout: Two level;Bed/bath upstairs Alternate Level Stairs-Number of Steps: Flight Alternate Level Stairs-Rails: Can reach both Bathroom Shower/Tub: Health visitor: Standard Bathroom Accessibility: Yes  Lives With: Spouse Prior Function Level of Independence: Independent with basic ADLs;Independent with homemaking with ambulation;Independent with gait;Independent with transfers  Able to Take Stairs?: Yes Driving: Yes Vocation: Retired IT consultant Overall Cognitive Status: Within Functional Limits for tasks assessed Arousal/Alertness: Awake/alert Orientation Level: Oriented X4 Sensation Sensation Light Touch: Appears Intact Hot/Cold: Appears Intact Proprioception: Appears Intact Coordination Gross Motor Movements are Fluid and Coordinated: No Fine Motor Movements are Fluid and Coordinated: Yes Coordination and Movement Description: Deficits due to generalized weakness and decreased activity tolerance. Motor  Motor Motor: Other (comment) Motor - Skilled Clinical Observations: Deficits due to generalized weakness and decreased activity tolerance.  Trunk/Postural Assessment  Cervical Assessment Cervical Assessment: Within Functional Limits Thoracic Assessment Thoracic Assessment: Within Functional Limits Lumbar Assessment Lumbar Assessment: Within Functional Limits Postural Control Postural Control: Deficits on evaluation Righting Reactions: Decreased/Delayed Protective Responses: Decreased/Delayed  Balance Balance Balance Assessed: Yes Standardized Balance  Assessment Standardized Balance Assessment: Timed Up and Go Test Timed Up and Go Test TUG: Normal TUG Normal TUG (seconds): 35.89 Static Sitting Balance Static Sitting - Balance Support: Feet supported Static Sitting - Level of Assistance: 7: Independent Dynamic Sitting Balance Dynamic Sitting - Balance Support: Feet supported Dynamic Sitting -  Level of Assistance: 6: Modified independent (Device/Increase time) Static Standing Balance Static Standing - Balance Support: During functional activity;No upper extremity supported Static Standing - Level of Assistance: 4: Min assist Dynamic Standing Balance Dynamic Standing - Balance Support: During functional activity;No upper extremity supported Dynamic Standing - Level of Assistance: 4: Min assist Extremity Assessment  RLE Assessment RLE Assessment: Exceptions to [facility] General Strength Comments: grossly 4/5 tested in sitting, deficits noted in power production and muscular endurance LLE Assessment LLE Assessment: Exceptions to [facility] General Strength Comments: grossly 4/5 tested in sitting, deficits noted in power production and muscular endurance  Care Tool Care Tool Bed Mobility Roll left and right activity   Roll left and right assist level: Supervision/Verbal cueing    Sit to lying activity   Sit to lying assist level: Supervision/Verbal cueing    Lying to sitting on side of bed activity   Lying to sitting on side of bed assist level: the ability to move from lying on the back to sitting on the side of the bed with no back support.: Supervision/Verbal cueing     Care Tool Transfers Sit to stand transfer   Sit to stand assist level: Minimal Assistance - Patient > 75%    Chair/bed transfer   Chair/bed transfer assist level: Minimal Assistance - Patient > 75%    Car transfer   Car transfer assist level: Minimal Assistance - Patient > 75%      Care Tool Locomotion Ambulation   Assist level: Minimal Assistance - Patient >  75% Assistive device: Walker-rolling Max distance: 180'  Walk 10 feet activity   Assist level: Minimal Assistance - Patient > 75% Assistive device: Walker-Eva   Walk 50 feet with 2 turns activity   Assist level: Minimal Assistance - Patient > 75% Assistive device: Walker-rolling  Walk 150 feet activity   Assist level: Minimal Assistance - Patient > 75% Assistive device: Walker-rolling  Walk 10 feet on uneven surfaces activity   Assist level: Minimal Assistance - Patient > 75% Assistive device: Walker-rolling  Stairs   Assist level: Minimal Assistance - Patient > 75% Stairs assistive device: 2 hand rails Max number of stairs: 1  Walk up/down 1 step activity   Walk up/down 1 step (curb) assist level: Minimal Assistance - Patient > 75% Walk up/down 1 step or curb assistive device: 2 hand rails  Walk up/down 4 steps activity Walk up/down 4 steps activity did not occur: Safety/medical concerns      Walk up/down 12 steps activity Walk up/down 12 steps activity did not occur: Safety/medical concerns      Pick up small objects from floor Pick up small object from the floor (from standing position) activity did not occur: Safety/medical concerns      Wheelchair Is the patient using a wheelchair?: Yes Type of Wheelchair: Manual   Wheelchair assist level: Dependent - Patient 0% Max wheelchair distance: 150'  Wheel 50 feet with 2 turns activity   Assist Level: Dependent - Patient 0%  Wheel 150 feet activity   Assist Level: Dependent - Patient 0%    Refer to Care Plan for Long Term Goals  SHORT TERM GOAL WEEK 1 PT Short Term Goal 1 (Week 1): STG = LTG due to ELOS  Recommendations for other services: None   Skilled Therapeutic Intervention SESSION 1: Evaluation completed (see details above and below) with education on PT POC and goals and individual treatment initiated with focus on gait training and therapeutic activities for education on condition, activity  tolerance, and  tolerance to upright. Pt denies pain. Pt completes bed mobility supervision, transfers with CGA/min assist without device, transfers with CGA with RW, gait without RW 12' min assist, gait with RW 180' with CGA/min assist. Pt demonstrating increased forward trunk flexion, decreased proximity to RW, and increased shuffling gait with fatigue. Pt completes up/down bottom step 6 x4 with BHRs min assist. Pt completes car transfer with min assist no device and cues for sequencing for safety. Pt completes gait up/down ramp with RW min assist, shuffling gait noted. Pt completes TUG for assessment of falls risk in 35.89 seconds indicating increased risk for falls. Pt also completes 5XSTS test in 27.43 seconds. Pt educated on difference between muscular strength vs power production and endurance and provided with education on POC. Pt returns to room, completes ambulatory transfer to bathroom with RW CGA, sits to toilet with CGA, manages 3/3 toileting task with supervision. Pt completes hand hygiene in standing requires cues for RW management, then returns to [facility] where she remains seated with all needs within reach, cal light in place at end of session.  Vitals: BP 137/74, HR 61  SESSION 2: Pt presents in room seated in WC, agreeable to PT. Pt denies pain. Session focused on therapeutic activities to facilitate participation with self care tasks and promote BLE strengthening and activity tolerance, as well as NMR for promote improved muscular power production and endurance. Pt completes transfers with CGA/min assist throughout session. Pt requests to use restroom, completes ambulatory transfer to restroom demonstrating increased BLE fatigue, shuffling gait, and forward trunk requiring CGA. Pt transported to ortho gym dependently in [facility] for time management and energy conservation. Pt completes continuous training on nustep BUE/BLE for total 10 min on level 1 resistance, maintains 60 SPM throughout activity, completed for  activity tolerance and strengthening. Pt then completes transfer back to [facility] for seated rest break. Pt completes standing therex as NMR with BUE support on RW to promote BLE strengthening, activity tolerance, and muscle fiber recruitment needed for functional transfers including: Marching x20 alternating BLE Heel raise x10 Hip abduction x10 BLE Hip extension x10 BLE Hamstring curl x10 BLE Mini squats x10 Standing glute set x10 Pt returns to room and completes stand step transfer without device with CGA, cues for positioning. Pt returns to supine with supervision with max cues for sequencing repositioning in supine for bridging. Pt remains supine with all needs within reach, cal light in place, bed alarm activated, and pt husband present at bedside at end of session.    Mobility Bed Mobility Bed Mobility: Supine to Sit;Sit to Supine Supine to Sit: Supervision/Verbal cueing Sit to Supine: Supervision/Verbal cueing Transfers Transfers: Sit to Stand;Stand to Sit;Stand Pivot Transfers Sit to Stand: Contact Guard/Touching assist;Minimal Assistance - Patient > 75% Stand to Sit: Contact Guard/Touching assist;Minimal Assistance - Patient > 75% Stand Pivot Transfers: Minimal Assistance - Patient > 75% Stand Pivot Transfer Details: Verbal cues for precautions/safety;Verbal cues for gait pattern Transfer (Assistive device): None Locomotion  Gait Ambulation: Yes Gait Distance (Feet): 180 Feet Assistive device: Rolling walker Gait Gait: Yes Gait Pattern: Impaired Gait Pattern: Poor foot clearance - left;Poor foot clearance - right;Trunk flexed;Wide base of support;Decreased stride length Gait velocity: decreased Stairs / Additional Locomotion Stairs: Yes Stairs Assistance: Minimal Assistance - Patient > 75% Stair Management Technique: Two rails Number of Stairs: 1 Height of Stairs: 6 Wheelchair Mobility Wheelchair Mobility: No   Discharge Criteria: Patient will be discharged from PT if  patient refuses treatment 3 consecutive  times without medical reason, if treatment goals not met, if there is a change in medical status, if patient makes no progress towards goals or if patient is discharged from hospital.  The above assessment, treatment plan, treatment alternatives and goals were discussed and mutually agreed upon: by patient  Reche Ohara PT, DPT 07/08/2023, 12:46 PM

## 2023-07-09 DIAGNOSIS — G7281 Critical illness myopathy: Secondary | ICD-10-CM | POA: Diagnosis not present

## 2023-07-09 LAB — GLUCOSE, CAPILLARY
Glucose-Capillary: 97 mg/dL (ref 70–99)
Glucose-Capillary: 132 mg/dL — ABNORMAL HIGH (ref 70–99)
Glucose-Capillary: 166 mg/dL — ABNORMAL HIGH (ref 70–99)
Glucose-Capillary: 123 mg/dL — ABNORMAL HIGH (ref 70–99)

## 2023-07-09 MED ORDER — FUROSEMIDE 20 MG PO TABS
20.0000 mg | ORAL_TABLET | Freq: Once | ORAL | Status: AC
  Administered 2023-07-09: 20 mg via ORAL
  Filled 2023-07-09: qty 1

## 2023-07-09 NOTE — Plan of Care (Signed)
  Problem: Consults Goal: RH GENERAL PATIENT EDUCATION Description: See Patient Education module for education specifics. Outcome: Progressing   Problem: RH BOWEL ELIMINATION Goal: RH STG MANAGE BOWEL WITH ASSISTANCE Description: STG Manage Bowel with  mod I Assistance. Outcome: Progressing   Problem: RH BLADDER ELIMINATION Goal: RH STG MANAGE BLADDER WITH ASSISTANCE Description: STG Manage Bladder With mod I  Assistance Outcome: Progressing   Problem: RH SKIN INTEGRITY Goal: RH STG SKIN FREE OF INFECTION/BREAKDOWN Description: Manage skin free of infection/ breakdown with mod I assistance Outcome: Progressing   Problem: RH SAFETY Goal: RH STG ADHERE TO SAFETY PRECAUTIONS W/ASSISTANCE/DEVICE Description: STG Adhere to Safety Precautions With mod I assistance Assistance/Device. Outcome: Progressing   Problem: RH PAIN MANAGEMENT Goal: RH STG PAIN MANAGED AT OR BELOW PT'S PAIN GOAL Description: <4 w/ prns Outcome: Progressing   Problem: RH KNOWLEDGE DEFICIT GENERAL Goal: RH STG INCREASE KNOWLEDGE OF SELF CARE AFTER HOSPITALIZATION Description: Manage increase knowledge of self care after hospitalization with mod I assistance using educational materials provided Outcome: Progressing

## 2023-07-09 NOTE — Progress Notes (Signed)
 PROGRESS NOTE   Subjective/Complaints: No new complaints this morning Patient's chart reviewed- No issues reported overnight Vitals signs stable    ROS:  Pt denies SOB, abd pain, CP, N/V/C/D, and vision changes   Objective:   No results found. Recent Labs    07/07/23 1407 07/08/23 0431  WBC 19.1* 19.3*  HGB 14.2 12.5  HCT 41.2 36.4  PLT 179 157   Recent Labs    07/07/23 0447 07/08/23 0431  NA 138 140  K 3.3* 3.5  CL 113* 115*  CO2 18* 20*  GLUCOSE 145* 82  BUN 38* 41*  CREATININE 1.64* 1.57*  CALCIUM  8.3* 8.1*    Intake/Output Summary (Last 24 hours) at 07/09/2023 1647 Last data filed at 07/09/2023 1000 Gross per 24 hour  Intake 420 ml  Output --  Net 420 ml        Physical Exam: Vital Signs Blood pressure 127/76, pulse 68, temperature (!) 97.4 F (36.3 C), resp. rate 17, height 5' 3.5 (1.613 m), weight 80.1 kg, SpO2 100%.     General: awake, alert, appropriate, sitting up in w/c- flat affect;  NAD HENT: conjugate gaze; oropharynx moist CV: regular rate and rhythm; no JVD Pulmonary: CTA B/L; no W/R/R- good air movement GI: soft, NT, ND, (+)BS Psychiatric: appropriate but flat Neurological: Ox3 Musculoskeletal:     Cervical back: Normal range of motion and neck supple.     Comments: Ue's- Deltoids 4-/5 otherwise 5-/5 throughout in Ue's LE's- RLE- HF 2+/5; 4+/5 otherwise LLE- HF 2-/5 KE/KF/DF/PF all 4+/5 otherwise, stable 7/5   Skin:    General: Skin is warm and dry.     Capillary Refill: Capillary refill takes less than 2 seconds.     Coloration: Skin is pale.     Comments: No skin breakdown seen  Neurological:     Mental Status: She is alert.     Sensory: No sensory deficit.     Comments: Intact to light touch in all 4 extremities Voice OK, but sounds somewhat weak Ox3 and appropriate- but slightly delayed responses Assessment/Plan: 1. Functional deficits which require 3+ hours per  day of interdisciplinary therapy in a comprehensive inpatient rehab setting. Physiatrist is providing close team supervision and 24 hour management of active medical problems listed below. Physiatrist and rehab team continue to assess barriers to discharge/monitor patient progress toward functional and medical goals  Care Tool:  Bathing    Body parts bathed by patient: Right arm, Left arm, Chest, Abdomen, Front perineal area, Buttocks, Right upper leg, Left upper leg, Right lower leg, Left lower leg, Face         Bathing assist Assist Level: Contact Guard/Touching assist     Upper Body Dressing/Undressing Upper body dressing   What is the patient wearing?: Pull over shirt, Bra    Upper body assist Assist Level: Set up assist    Lower Body Dressing/Undressing Lower body dressing      What is the patient wearing?: Underwear/pull up, Pants     Lower body assist Assist for lower body dressing: Contact Guard/Touching assist     Toileting Toileting    Toileting assist Assist for toileting: Contact Guard/Touching assist  Transfers Chair/bed transfer  Transfers assist     Chair/bed transfer assist level: Minimal Assistance - Patient > 75%     Locomotion Ambulation   Ambulation assist      Assist level: Minimal Assistance - Patient > 75% Assistive device: Walker-rolling Max distance: 180'   Walk 10 feet activity   Assist     Assist level: Minimal Assistance - Patient > 75% Assistive device: Walker-Eva   Walk 50 feet activity   Assist    Assist level: Minimal Assistance - Patient > 75% Assistive device: Walker-rolling    Walk 150 feet activity   Assist    Assist level: Minimal Assistance - Patient > 75% Assistive device: Walker-rolling    Walk 10 feet on uneven surface  activity   Assist     Assist level: Minimal Assistance - Patient > 75% Assistive device: Walker-rolling   Wheelchair     Assist Is the patient using a  wheelchair?: Yes Type of Wheelchair: Manual    Wheelchair assist level: Dependent - Patient 0% Max wheelchair distance: 150'    Wheelchair 50 feet with 2 turns activity    Assist        Assist Level: Dependent - Patient 0%   Wheelchair 150 feet activity     Assist      Assist Level: Dependent - Patient 0%   Blood pressure 127/76, pulse 68, temperature (!) 97.4 F (36.3 C), resp. rate 17, height 5' 3.5 (1.613 m), weight 80.1 kg, SpO2 100%.  Medical Problem List and Plan: 1. 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  Antithrombotics: -DVT/anticoagulation:  Mechanical: Sequential compression devices, below knee Bilateral lower extremities             -antiplatelet therapy: resume ASA 81 mg 7/4 3. Pain Management: No meds ordered- pt denies pain- cannot have tylenol  or NSAIDs due to LFT elevation/autoimmune hepatitis  4. Mood/Behavior/Sleep: LCSW to follow for evaluation and support when available.              -antipsychotic agents: N/A 5. Neuropsych/cognition: This patient is capable of making decisions on her own behalf. 6. Skin/Wound Care: Routine pressure relief measures  7. Fluids/Electrolytes/Nutrition: CMET in a.m   -Daily weights- Strict I&O              -Renal w/ 1200 fluid restrict              -Severe protien calorie malnutrition: add Glucerna - Alb 1.6- concern that it's due to her hepatitis- will monitor -recheck next Monday             -Vitamin C               -Constipation- LBM 7/2 reports hard pebble like stools-will add Senna-S 1 tab/day 8. Hypertension: holding home amlodipine   -continue Lasix  40 mg P.O twice daily              - CTM BPs closely  9. Hypokalemia: K+ now 3.3 Goal K+ > 4.0 and Mag > 2.0.  -continue potassium 20 meq daily              -will recheck levels  in  AM  7/4- K+ 3.5 this AM- might need to increase to 40 mEq  10. Transaminitis/Elevated Liver Enzyme due to Autoimmune hepatitis/pancreatic cyst/dilated bile duct: Monitor Hepatic panel + CBC w/diff -Repeat LFT's on 7/7 and 7/10 per GI  -Prednisone  40 mg started 07/06/23- Taper by 5 mg/week until done.              TMPT-pending may need to start Thiopurine if normal  -will need ASAP f/u with GI and hepatology after d/c- GI will attempt to work up pancreatic cyst and bile duct dilation while here.   11. Hyperlipidemia: continue holding atorvastatin  due to abnormal LFTs.  12. Glaucoma: continue Dorzolamide -Timolol  and Latanoprost    13. Pre-Diabetes: A1c 6.2. now on prednisone               -monitor BS ac/hs and use SSI for elevated BS due to high doses of Prednisone   7/4- BG dropped to 69 overnight- but will order a night snack to help  14.  Class I obesity: Counsel on diet modifications and exercise to facilitate weight loss.  15. Leukocytosis- just started Prednisone  today- I'm not sure would cause WBC to go to 19k from 10.9k so fast- will recheck in AM and monitor clinically for possible infection  7/4- WBC 19.3- since not still going up, I think more likely from Prednisone - just started after 1 dose of Prednisone -   16. Thrombocytopenia- Plts dropped to 107k today from 155k- will recheck in AM- if better, will check Monday/Thursdays- to reduce lab draws  7/4- Plt sup to 157k   17. ARF s/p E Coli- with improving renal function  7/4- Cr down to 1.57 and BUN slightly up to 41 (not drinking well)  -encouraged pt to drink more- also will make fluid restriction 1800cc now  18. Lower extremity edema: worse as per patient, additional Lasix  20mg  administered 7/5     LOS: 2 days A FACE TO FACE EVALUATION WAS PERFORMED  Sven SQUIBB Kentley Cedillo 07/09/2023, 4:47 PM

## 2023-07-10 ENCOUNTER — Ambulatory Visit: Payer: Self-pay | Admitting: Gastroenterology

## 2023-07-10 DIAGNOSIS — G7281 Critical illness myopathy: Secondary | ICD-10-CM | POA: Diagnosis not present

## 2023-07-10 LAB — GLUCOSE, CAPILLARY
Glucose-Capillary: 96 mg/dL (ref 70–99)
Glucose-Capillary: 120 mg/dL — ABNORMAL HIGH (ref 70–99)
Glucose-Capillary: 166 mg/dL — ABNORMAL HIGH (ref 70–99)

## 2023-07-10 NOTE — Progress Notes (Signed)
 Occupational Therapy Session Note  Patient Details  Name: Marissa Orozco MRN: [id number] Date of Birth: November 05, 1945  Today's Date: 07/10/2023 OT Individual Time: 1415-1530 OT Individual Time Calculation (min): 75 min    Short Term Goals: Week 1:  OT Short Term Goal 1 (Week 1): STGs=LTGs due to patient's estimated length of stay.  Skilled Therapeutic Interventions/Progress Updates:      Therapy Documentation Precautions:  Precautions Precautions: Fall Recall of Precautions/Restrictions: Intact Restrictions Weight Bearing Restrictions Per Provider Order: No General: Pt supine in bed upon OT arrival, agreeable to OT session. Husband present.  Pain: no pain reporte  Exercises: Pt completed the following exercise circuit in order to improve functional activity, strength and endurance to prepare for ADLs such as bathing. Pt completed the following exercises in seated/standing position with no noted LOB/SOB and various repetitions on each exercise: -bicep curls red theraband -triceps extensions red theraband -shoulder abduction red theraband -modified squats  Pt completed 15 minutes of nu step bike in order to increase BUE/BLEstrength and endurance in preparation for increased independence in ADLs. No Rest break, on level 5 resistance. Pt went total of 770 steps, 0.4 mi, average 41 spm.    Other Treatments: OT providing therapeutic use of self in order to build rapport and discuss patient current situation and goals for therapy. OT providing handout for energy conservation describing what it is, purpose of conserving energy and specific examples of how to conserve energy during ADL and IADL tasks. Pt appreciative for information with good carryover.   Pt supine in bed with bed alarm activated, 2 bed rails up, call light within reach and 4Ps assessed. Husband present     Therapy/Group: Individual Therapy  Camie Hoe, OTD, OTR/L 07/10/2023, 1:03 PM

## 2023-07-10 NOTE — IPOC Note (Signed)
 Overall Plan of Care [facility]) Patient Details Name: Marissa Orozco MRN: [id number] DOB: [birth year]  Admitting Diagnosis: Intensive care (ICU) myopathy  Hospital Problems: Principal Problem:   Intensive care (ICU) myopathy Active Problems:   AKI (acute kidney injury) (HCC)     Functional Problem List: Nursing Bladder, Bowel, Edema, Endurance, Medication Management, Pain, Perception, Safety, Skin Integrity  PT Endurance, Balance, Safety  OT Balance, Endurance  SLP    TR         Basic ADL's: OT Bathing, Dressing, Toileting     Advanced  ADL's: OT Simple Meal Preparation, Light Housekeeping     Transfers: PT Bed Mobility, Bed to Chair, Car, Occupational psychologist, Research scientist (life sciences): PT Stairs, Ambulation     Additional Impairments: OT    SLP        TR      Anticipated Outcomes Item Anticipated Outcome  Self Feeding    Swallowing      Basic self-care  Mod I  Toileting  Mod I   Bathroom Transfers Mod I  Bowel/Bladder  manage bowels with medications/manage bladder with toileting assistance  Transfers     Locomotion     Communication     Cognition     Pain  <4 w/ prns  Safety/Judgment  manage safety wthcmod I assistance   Therapy Plan: PT Intensity: Minimum of 1-2 x/day ,45 to 90 minutes PT Frequency: 5 out of 7 days PT Duration Estimated Length of Stay: 7-10 days OT Intensity: Minimum of 1-2 x/day, 45 to 90 minutes OT Frequency: 5 out of 7 days OT Duration/Estimated Length of Stay: 7-10 days     Team Interventions: Nursing Interventions Patient/Family Education, Bladder Management, Bowel Management, Discharge Planning, Pain Management, Medication Management, Skin Care/Wound Management  PT interventions Ambulation/gait training, Pain management, Disease management/prevention, Stair training, Warden/ranger, DME/adaptive equipment instruction, Patient/family education, Therapeutic Activities, Psychosocial support, Therapeutic  Exercise, Community reintegration, Functional mobility training, UE/LE Strength taining/ROM, Discharge planning, Neuromuscular re-education, UE/LE Coordination activities  OT Interventions Warden/ranger, Community reintegration, Discharge planning, Disease mangement/prevention, Fish farm manager, Functional mobility training, Neuromuscular re-education, Patient/family education, Psychosocial support, Self Care/advanced ADL retraining, Therapeutic Activities, Therapeutic Exercise, UE/LE Strength taining/ROM  SLP Interventions    TR Interventions    SW/CM Interventions Discharge Planning, Psychosocial Support, Patient/Family Education   Barriers to Discharge MD  Medical stability  Nursing Decreased caregiver support, Home environment access/layout Discharge: House  Discharge Home Layout: Two level  Alternate Level Stairs-Rails: Can reach both  Alternate Level Stairs-Number of Steps: flight  Discharge Home Access: Stairs to enter  Entrance Stairs-Rails: Can reach both  Entrance Stairs-Number of Steps: 4  PT Inaccessible home environment, Home environment access/layout 4 STE, full flight of stairs to bed/bath  OT Other (comments) (Decreased activity tolerance)    SLP      SW       Team Discharge Planning: Destination: PT-Home ,OT- Home , SLP-  Projected Follow-up: PT-Outpatient PT, OT-  None, SLP-  Projected Equipment Needs: PT-To be determined, OT- To be determined, SLP-  Equipment Details: PT- , OT-  Patient/family involved in discharge planning: PT- Patient,  OT-Patient, SLP-   MD ELOS: 14-20 days  Medical Rehab Prognosis:  Excellent Assessment: The patient has been admitted for CIR therapies with the diagnosis of ICU myopathy. The team will be addressing functional mobility, strength, stamina, balance, safety, adaptive techniques and equipment, self-care, bowel and bladder mgt, patient and caregiver education. Goals have been set at supervision.  Anticipated  discharge destination is home.        See Team Conference Notes for weekly updates to the plan of care

## 2023-07-10 NOTE — Progress Notes (Signed)
 Physical Therapy Session Note  Patient Details  Name: Marissa Orozco MRN: [id number] Date of Birth: 1945/12/11  Today's Date: 07/10/2023 PT Individual Time: 0730-0810 PT Individual Time Calculation (min): 40 min   Short Term Goals: Week 1:  PT Short Term Goal 1 (Week 1): STG = LTG due to ELOS  Skilled Therapeutic Interventions/Progress Updates:    Chart reviewed and pt agreeable to therapy. Pt received at sink seated in [facility] in care of RN for self-care with no c/o pain. Session focused on amb endurance and independence to promote safe home access. Pt taken to therapy gym for time management. Pt initiated session with amb of 223ft +265ft using S + RW. Pt then completed amb of 126ft + 184ft using CGA + no AD fading to S + no AD. Pt then completed balance exercises of narrow stance, eyes closed, and reaches outside BOS all with CGA fading to S + no AD/handhold. Pt also completed 1x10 heel raises and 1x10 sit to stand with CGA fading to S + no AD/handhold. Session education emphasized balance training across the lifespan. At end of session, pt was left seated in [facility] with alarm engaged, nurse call bell and all needs in reach.     Therapy Documentation Precautions:  Precautions Precautions: Fall Recall of Precautions/Restrictions: Intact Restrictions Weight Bearing Restrictions Per Provider Order: No General:      Therapy/Group: Individual Therapy  [name] 07/10/2023, 8:13 AM

## 2023-07-10 NOTE — Progress Notes (Signed)
 Occupational Therapy Session Note  Patient Details  Name: Marissa Orozco MRN: [id number] Date of Birth: 12-26-1945  Today's Date: 07/10/2023 OT Individual Time: 9069-8954 OT Individual Time Calculation (min): 75 min    Short Term Goals: Week 1:  OT Short Term Goal 1 (Week 1): STGs=LTGs due to patient's estimated length of stay.  Skilled Therapeutic Interventions/Progress Updates:    Patient finishing up shower with nursing staff. Patient was able to donn her non-skid socks with SBA. The pt was able to ambulate from the restroom to the sink using the RW with closeS. The pt transition from standing to sitting at sink level in the w/c with close S to comb her hair with s/uA. The pt was able to apply deodorant with s/uA as well. The pt was transported to the gym and was able to complete UB exercise using a 2lb dumb bell 2 sets of 15 for bicep curls, shld flexion, and elbow extension with rest breaks as needed. The pt was instructed in relaxation breathing to improve compliance.   The pt went on to complete the [employer]  for BUE for 5 minutes in duration.  At the end of the session, the pt was transported back to her room.  The pt was able to transfer from w/c LOF to EOB with CGA. The pt was able to transfer from EOB to supine in be with close S using the gait belt as a leg lift.  The call light and bedside table were both placed within reach with all additional needs addressed.   Therapy Documentation Precautions:  Precautions Precautions: Fall Recall of Precautions/Restrictions: Intact Restrictions Weight Bearing Restrictions Per Provider Order: No  Therapy/Group: Individual Therapy  Elvera JONETTA Mace 07/10/2023, 4:08 PM

## 2023-07-10 NOTE — Progress Notes (Signed)
 PROGRESS NOTE   Subjective/Complaints: No new complaints this morning Continues to have swelling but feels this has improved, does not feel like Lasix  helped and did not like that it made her urinate frequently   ROS:  Pt denies SOB, abd pain, CP, N/V/C/D, and vision changes, +lower extremity edema   Objective:   No results found. Recent Labs    07/07/23 1407 07/08/23 0431  WBC 19.1* 19.3*  HGB 14.2 12.5  HCT 41.2 36.4  PLT 179 157   Recent Labs    07/08/23 0431  NA 140  K 3.5  CL 115*  CO2 20*  GLUCOSE 82  BUN 41*  CREATININE 1.57*  CALCIUM  8.1*    Intake/Output Summary (Last 24 hours) at 07/10/2023 1043 Last data filed at 07/10/2023 0733 Gross per 24 hour  Intake 480 ml  Output 200 ml  Net 280 ml        Physical Exam: Vital Signs Blood pressure 138/62, pulse 62, temperature (!) 97.5 F (36.4 C), temperature source Oral, resp. rate 17, height 5' 3.5 (1.613 m), weight 80.6 kg, SpO2 97%.     General: awake, alert, appropriate, sitting up in w/c- flat affect;  NAD HENT: conjugate gaze; oropharynx moist CV: regular rate and rhythm; no JVD Pulmonary: CTA B/L; no W/R/R- good air movement GI: soft, NT, ND, (+)BS Psychiatric: appropriate but flat Neurological: Ox3 Musculoskeletal:     Cervical back: Normal range of motion and neck supple.     Comments: Ue's- Deltoids 4-/5 otherwise 5-/5 throughout in Ue's LE's- RLE- HF 2+/5; 4+/5 otherwise LLE- HF 2-/5 KE/KF/DF/PF all 4+/5 otherwise, stable 7/6   Skin:    General: Skin is warm and dry.     Capillary Refill: Capillary refill takes less than 2 seconds.     Coloration: Skin is pale.     Comments: No skin breakdown seen  Neurological:     Mental Status: She is alert.     Sensory: No sensory deficit.     Comments: Intact to light touch in all 4 extremities Voice OK, but sounds somewhat weak Ox3 and appropriate- but slightly delayed  responses Assessment/Plan: 1. Functional deficits which require 3+ hours per day of interdisciplinary therapy in a comprehensive inpatient rehab setting. Physiatrist is providing close team supervision and 24 hour management of active medical problems listed below. Physiatrist and rehab team continue to assess barriers to discharge/monitor patient progress toward functional and medical goals  Care Tool:  Bathing    Body parts bathed by patient: Right arm, Left arm, Chest, Abdomen, Front perineal area, Buttocks, Right upper leg, Left upper leg, Right lower leg, Left lower leg, Face         Bathing assist Assist Level: Contact Guard/Touching assist     Upper Body Dressing/Undressing Upper body dressing   What is the patient wearing?: Pull over shirt, Bra    Upper body assist Assist Level: Set up assist    Lower Body Dressing/Undressing Lower body dressing      What is the patient wearing?: Underwear/pull up, Pants     Lower body assist Assist for lower body dressing: Contact Guard/Touching assist     Toileting Toileting  Toileting assist Assist for toileting: Contact Guard/Touching assist     Transfers Chair/bed transfer  Transfers assist     Chair/bed transfer assist level: Minimal Assistance - Patient > 75%     Locomotion Ambulation   Ambulation assist      Assist level: Minimal Assistance - Patient > 75% Assistive device: Walker-rolling Max distance: 180'   Walk 10 feet activity   Assist     Assist level: Minimal Assistance - Patient > 75% Assistive device: Walker-Eva   Walk 50 feet activity   Assist    Assist level: Minimal Assistance - Patient > 75% Assistive device: Walker-rolling    Walk 150 feet activity   Assist    Assist level: Minimal Assistance - Patient > 75% Assistive device: Walker-rolling    Walk 10 feet on uneven surface  activity   Assist     Assist level: Minimal Assistance - Patient > 75% Assistive  device: Walker-rolling   Wheelchair     Assist Is the patient using a wheelchair?: Yes Type of Wheelchair: Manual    Wheelchair assist level: Dependent - Patient 0% Max wheelchair distance: 150'    Wheelchair 50 feet with 2 turns activity    Assist        Assist Level: Dependent - Patient 0%   Wheelchair 150 feet activity     Assist      Assist Level: Dependent - Patient 0%   Blood pressure 138/62, pulse 62, temperature (!) 97.5 F (36.4 C), temperature source Oral, resp. rate 17, height 5' 3.5 (1.613 m), weight 80.6 kg, SpO2 97%.  Medical Problem List and Plan: 1. 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  Antithrombotics: -DVT/anticoagulation:  Mechanical: Sequential compression devices, below knee Bilateral lower extremities             -antiplatelet therapy: resume ASA 81 mg 7/4 3. Pain Management: No meds ordered- pt denies pain- cannot have tylenol  or NSAIDs due to LFT elevation/autoimmune hepatitis  4. Mood/Behavior/Sleep: LCSW to follow for evaluation and support when available.              -antipsychotic agents: N/A 5. Neuropsych/cognition: This patient is capable of making decisions on her own behalf. 6. Skin/Wound Care: Routine pressure relief measures  7. Fluids/Electrolytes/Nutrition: CMET in a.m   -Daily weights- Strict I&O              -Renal w/ 1200 fluid restrict              -Severe protien calorie malnutrition: add Glucerna - Alb 1.6- concern that it's due to her hepatitis- will monitor -recheck next Monday             -Vitamin C               -Constipation- LBM 7/2 reports hard pebble like stools-will add Senna-S 1 tab/day  Reviewed and last BM is 7/5 8. Hypertension: holding home amlodipine   -continue Lasix  40 mg P.O twice daily               - CTM BPs closely  9. Hypokalemia: K+ now 3.3 Goal K+ >  4.0 and Mag > 2.0.  -continue potassium 20 meq daily              -will recheck levels in AM  7/4- K+ 3.5 this AM- might need to increase to 40 mEq  10. Transaminitis/Elevated Liver Enzyme due to Autoimmune hepatitis/pancreatic cyst/dilated bile duct: Monitor Hepatic panel + CBC w/diff -Repeat LFT's on 7/7 and 7/10 per GI  -Prednisone  40 mg started 07/06/23- Taper by 5 mg/week until done.              TMPT-pending may need to start Thiopurine if normal  -will need ASAP f/u with GI and hepatology after d/c- GI will attempt to work up pancreatic cyst and bile duct dilation while here.   11. Hyperlipidemia: continue holding atorvastatin  due to abnormal LFTs.  12. Glaucoma: continue Dorzolamide -Timolol  and Latanoprost    13. Pre-Diabetes: A1c 6.2. now on prednisone               -monitor BS ac/hs and use SSI for elevated BS due to high doses of Prednisone   7/4- BG dropped to 69 overnight- but will order a night snack to help  14.  Class I obesity: Counsel on diet modifications and exercise to facilitate weight loss.  15. Leukocytosis- just started Prednisone  today- I'm not sure would cause WBC to go to 19k from 10.9k so fast- will recheck in AM and monitor clinically for possible infection  7/4- WBC 19.3- since not still going up, I think more likely from Prednisone - just started after 1 dose of Prednisone -   16. Thrombocytopenia- Plts dropped to 107k today from 155k- will recheck in AM- if better, will check Monday/Thursdays- to reduce lab draws  7/4- Plt sup to 157k   17. ARF s/p E Coli- with improving renal function  7/4- Cr down to 1.57 and BUN slightly up to 41 (not drinking well)  -encouraged pt to drink more- also will make fluid restriction 1800cc now  18. Lower extremity edema: worse as per patient, additional Lasix  20mg  administered 7/5, discussed with patient and she feels edema improved 7/6 and does not want additional  lasix      LOS: 3 days A FACE TO FACE EVALUATION WAS PERFORMED  Sven P Elzie Sheets 07/10/2023, 10:43 AM

## 2023-07-11 DIAGNOSIS — G7281 Critical illness myopathy: Secondary | ICD-10-CM | POA: Diagnosis not present

## 2023-07-11 LAB — CBC WITH DIFFERENTIAL/PLATELET
Abs Immature Granulocytes: 0.2 K/uL — ABNORMAL HIGH (ref 0.00–0.07)
Basophils Absolute: 0.2 K/uL — ABNORMAL HIGH (ref 0.0–0.1)
Basophils Relative: 1 %
Eosinophils Absolute: 0.7 K/uL — ABNORMAL HIGH (ref 0.0–0.5)
Eosinophils Relative: 4 %
HCT: 39.1 % (ref 36.0–46.0)
Hemoglobin: 13.5 g/dL (ref 12.0–15.0)
Lymphocytes Relative: 10 %
Lymphs Abs: 1.7 K/uL (ref 0.7–4.0)
MCH: 31.7 pg (ref 26.0–34.0)
MCHC: 34.5 g/dL (ref 30.0–36.0)
MCV: 91.8 fL (ref 80.0–100.0)
Monocytes Absolute: 1 K/uL (ref 0.1–1.0)
Monocytes Relative: 6 %
Myelocytes: 1 %
Neutro Abs: 13.3 K/uL — ABNORMAL HIGH (ref 1.7–7.7)
Neutrophils Relative %: 78 %
Platelets: 200 K/uL (ref 150–400)
RBC: 4.26 MIL/uL (ref 3.87–5.11)
RDW: 23.6 % — ABNORMAL HIGH (ref 11.5–15.5)
WBC: 17.1 K/uL — ABNORMAL HIGH (ref 4.0–10.5)
nRBC: 0 % (ref 0.0–0.2)
nRBC: 0 /100{WBCs}

## 2023-07-11 LAB — GLUCOSE, CAPILLARY
Glucose-Capillary: 88 mg/dL (ref 70–99)
Glucose-Capillary: 100 mg/dL — ABNORMAL HIGH (ref 70–99)
Glucose-Capillary: 200 mg/dL — ABNORMAL HIGH (ref 70–99)
Glucose-Capillary: 133 mg/dL — ABNORMAL HIGH (ref 70–99)

## 2023-07-11 LAB — HEPATIC FUNCTION PANEL
ALT: 271 U/L — ABNORMAL HIGH (ref 0–44)
AST: 283 U/L — ABNORMAL HIGH (ref 15–41)
Albumin: 1.8 g/dL — ABNORMAL LOW (ref 3.5–5.0)
Alkaline Phosphatase: 184 U/L — ABNORMAL HIGH (ref 38–126)
Bilirubin, Direct: 1.1 mg/dL — ABNORMAL HIGH (ref 0.0–0.2)
Indirect Bilirubin: 2 mg/dL — ABNORMAL HIGH (ref 0.3–0.9)
Total Bilirubin: 3.1 mg/dL — ABNORMAL HIGH (ref 0.0–1.2)
Total Protein: 5.5 g/dL — ABNORMAL LOW (ref 6.5–8.1)

## 2023-07-11 LAB — BASIC METABOLIC PANEL WITH GFR
Anion gap: 12 (ref 5–15)
BUN: 33 mg/dL — ABNORMAL HIGH (ref 8–23)
CO2: 19 mmol/L — ABNORMAL LOW (ref 22–32)
Calcium: 8.1 mg/dL — ABNORMAL LOW (ref 8.9–10.3)
Chloride: 106 mmol/L (ref 98–111)
Creatinine, Ser: 1.42 mg/dL — ABNORMAL HIGH (ref 0.44–1.00)
GFR, Estimated: 38 mL/min — ABNORMAL LOW (ref >60)
Glucose, Bld: 73 mg/dL (ref 70–99)
Potassium: 3.8 mmol/L (ref 3.5–5.1)
Sodium: 137 mmol/L (ref 135–145)

## 2023-07-11 NOTE — Progress Notes (Signed)
 Occupational Therapy Session Note  Patient Details  Name: Marissa Orozco MRN: [id number] Date of Birth: 10-Mar-1945  Today's Date: 07/11/2023 OT Individual Time: 9084-8986 & 1335-1400 OT Individual Time Calculation (min): 58 min & 25 min    Short Term Goals: Week 1:  OT Short Term Goal 1 (Week 1): STGs=LTGs due to patient's estimated length of stay.  Skilled Therapeutic Interventions/Progress Updates:      Therapy Documentation Precautions:  Precautions Precautions: Fall Recall of Precautions/Restrictions: Intact Restrictions Weight Bearing Restrictions Per Provider Order: No Session 1 General: Pt supine in bed upon OT arrival, agreeable to OT session.  Pain: no pain reported  ADL: OT providing skilled intervention on ADL retraining in order to increase independence with tasks and increase activity tolerance. Pt completed the following tasks at the current level of assist: Bed mobility: SBA supine><EOB with flat bed and use of bed rails Grooming/oral hygiene: seated in W/C brushing teeth and grooming hair, able to reach out of BOS in order to reach UB dressing: SBA in standing donning/doffing overhead shirt, no LOB LB dressing: standing to doff using unilateral support from RW, seated to don over feet, OT educating on dressing weaker leg first for increased independence Footwear: SBA seated in W/C donning/doffing slip on shoes Shower transfer: SBA with RW  Bathing: SBA, standing to cleanse peri area using grab bars, seated on TTB for rest of shower  Exercises: Pt completed the following exercise circuit in order to improve functional activity, strength and endurance to prepare for ADLs such as bathing. Pt completed the following exercises in seated position with no noted LOB/SOB and 3x10 repetitions on each exercise: -chest press with 5.5# ball -russian twists with 5# ball   Other Treatments: OT providing skilled intervention with functional mobility with introduction of  rollator for functional mobility d/t increased steadiness and decreased energy when ambulating. OT educating on safety and techniques for rollator use.    Pt supine in bed with bed alarm activated, 2 bed rails up, call light within reach and 4Ps assessed.   Session 2 General: Pt supine in bed upon OT arrival, agreeable to OT session.  Pain: no pain reported  ADL: OT providing skilled intervention on ADL retraining in order to increase independence with tasks and increase activity tolerance. Pt completed the following tasks at the current level of assist: Bed mobility: SBA supine><EOB with bed rail Toileting: completed 3/3 steps at SBA without LOB and no AD Toilet transfer: CGA-SBA no AD ambulating from bed><toilet, no LOB/SOB  Exercises:Pt completed the following exercise circuit in order to improve functional activity, strength and endurance to prepare for ADLs such as bathing. Pt completed the following exercises in seated/standing position with no noted LOB/SOB and 3x10 repetitions on each exercise with PRN rest breaks: -sit to stands from raised bed with no hand assistance -standing marches with no UE support   Pt supine in bed with bed alarm activated, 2 bed rails up, call light within reach and 4Ps assessed.   Therapy/Group: Individual Therapy  Camie Hoe, OTD, OTR/L 07/11/2023, 3:56 PM

## 2023-07-11 NOTE — Progress Notes (Signed)
 Physical Therapy Session Note  Patient Details  Name: Marissa Orozco MRN: [id number] Date of Birth: 1945/02/12  Today's Date: 07/11/2023 PT Individual Time: 1415-1500 PT Individual Time Calculation (min): 45 min   Short Term Goals: Week 1:  PT Short Term Goal 1 (Week 1): STG = LTG due to ELOS  Skilled Therapeutic Interventions/Progress Updates: Pt in bed when PT arrived for session.  Pt denies pain and reports her energy is getting better.  Pt ambulated from room to ortho gym with rollator, cueing needed for heel toe pattern, upright posture.  NuStep x19' with UE/LE starting level 1 x2', level 3 x3', level 5 remaining.  Seated ex's with 2.5# ankle wts: LAQs, step outs, marching x15 each, standing with rollator: heel raises x20, marching x15, hip abd x15. Pt ambulated back to room, transferred into bed with supervision, all items within reach, husband present.     5xSTS: 14.22 with rollator  Therapy Documentation Precautions:  Precautions Precautions: Fall Recall of Precautions/Restrictions: Intact Restrictions Weight Bearing Restrictions Per Provider Order: No    Therapy/Group: Individual Therapy  Arland GORMAN Fast 07/11/2023, 3:54 PM

## 2023-07-11 NOTE — Progress Notes (Signed)
 Physical Therapy Session Note  Patient Details  Name: Marissa Orozco MRN: [id number] Date of Birth: 1945-11-14  Today's Date: 07/11/2023 PT Individual Time: 8954-8884 PT Individual Time Calculation (min): 30 min   Short Term Goals: Week 1:  PT Short Term Goal 1 (Week 1): STG = LTG due to ELOS  Skilled Therapeutic Interventions/Progress Updates:    Pt sitting up at EOB when PT arrived. Pt agreeable to PT session, requesting to use restroom.  Pt demonstrates good transfers with supervision from bed<->commode. Pt presents mild LOB with immediate standing, but will self correct increasing BOS with AD. Pt performed standing ex's with RW: heel raises 2x30, hip flexion, extension, HS curls; 2x15 each.  Rest needed between sets with noted SOB, pt denies pain.  BP 140/87 after first set, 142/101 after second set; RN entered when BP taken and notified of increased diastolic numbers. Pt remained asymptomatic.  Pt requesting to get into bed post session. Pt mod indep with transfer to bed, all items within reach.   Therapy Documentation Precautions:  Precautions Precautions: Fall Recall of Precautions/Restrictions: Intact Restrictions Weight Bearing Restrictions Per Provider Order: No  Pain: Pt denies pain      Therapy/Group: Individual Therapy  Marissa Orozco Fast 07/11/2023, 12:25 PM

## 2023-07-11 NOTE — Progress Notes (Signed)
 Physical Therapy Session Note  Patient Details  Name: Marissa Orozco MRN: [id number] Date of Birth: 1945-06-10  Today's Date: 07/11/2023 PT Individual Time: 1300-1330 PT Individual Time Calculation (min): 30 min   Short Term Goals: Week 1:  PT Short Term Goal 1 (Week 1): STG = LTG due to ELOS  Skilled Therapeutic Interventions/Progress Updates:    Pt recd seated EOB, No complaint of pain. Pt ambulated with rollator throughout session with close supervision , cues for upright posture. Pt participated in dynamic gait activities without AD with CGA overall. Pt navigated 3 and and 6 stairs with 2 hand rails, weaving through cones, and bending to pick up and carry bowling pins. Pt with no LOB throughout, but was limited by fatigue/endurance. Pt returned to bed after session and was left with all needs in reach and alarm active.   Therapy Documentation Precautions:  Precautions Precautions: Fall Recall of Precautions/Restrictions: Intact Restrictions Weight Bearing Restrictions Per Provider Order: No General:       Therapy/Group: Individual Therapy  Schuyler JAYSON Batter 07/11/2023, 4:36 PM

## 2023-07-11 NOTE — Progress Notes (Signed)
 Inpatient Rehabilitation  Patient information reviewed and entered into eRehab system by [facility]. Karen Kays., CCC/SLP, PPS Coordinator.  Information including medical coding, functional ability and quality indicators will be reviewed and updated through discharge.

## 2023-07-11 NOTE — Progress Notes (Signed)
 PROGRESS NOTE   Subjective/Complaints: No new complaints this morning  Pt reports was lightheaded in OT yesterday.  Said that BP was NOT low, but also having mild LE edema- didn't like Lasix .  Slept well.  LBM yesterday- weakness feels better somewhat.   ROS:   Pt denies SOB, abd pain, CP, N/V/C/D, and vision changes   Objective:   No results found. No results for input(s): WBC, HGB, HCT, PLT in the last 72 hours.  Recent Labs    07/11/23 0600  NA 137  K 3.8  CL 106  CO2 19*  GLUCOSE 73  BUN 33*  CREATININE 1.42*  CALCIUM  8.1*    Intake/Output Summary (Last 24 hours) at 07/11/2023 0831 Last data filed at 07/11/2023 0826 Gross per 24 hour  Intake 840 ml  Output 400 ml  Net 440 ml        Physical Exam: Vital Signs Blood pressure (!) 131/57, pulse (!) 59, temperature 97.9 F (36.6 C), resp. rate 18, height 5' 3.5 (1.613 m), weight 80.9 kg, SpO2 100%.      General: awake, alert, appropriate, NAD HENT: conjugate gaze; oropharynx moist CV: regular rate; no JVD Pulmonary: CTA B/L; no W/R/R- good air movement GI: soft, NT, ND, (+)BS Psychiatric: appropriate Neurological: Ox3  MSK: B/L LE's - HF 3/5 B/L- otherwise 4+/5 in LE's.  Musculoskeletal:     Cervical back: Normal range of motion and neck supple.     Comments: Ue's- Deltoids 4-/5 otherwise 5-/5 throughout in Ue's LE's- RLE- HF 2+/5; 4+/5 otherwise LLE- HF 2-/5 KE/KF/DF/PF all 4+/5 otherwise, stable 7/6   Skin:    General: Skin is warm and dry.     Capillary Refill: Capillary refill takes less than 2 seconds.     Coloration: Skin is pale.     Comments: No skin breakdown seen  Neurological:     Mental Status: She is alert.     Sensory: No sensory deficit.     Comments: Intact to light touch in all 4 extremities Voice OK, but sounds somewhat weak Ox3 and appropriate- but slightly delayed responses Assessment/Plan: 1. Functional  deficits which require 3+ hours per day of interdisciplinary therapy in a comprehensive inpatient rehab setting. Physiatrist is providing close team supervision and 24 hour management of active medical problems listed below. Physiatrist and rehab team continue to assess barriers to discharge/monitor patient progress toward functional and medical goals  Care Tool:  Bathing    Body parts bathed by patient: Right arm, Left arm, Chest, Abdomen, Front perineal area, Buttocks, Right upper leg, Left upper leg, Right lower leg, Left lower leg, Face         Bathing assist Assist Level: Contact Guard/Touching assist     Upper Body Dressing/Undressing Upper body dressing   What is the patient wearing?: Pull over shirt, Bra    Upper body assist Assist Level: Set up assist    Lower Body Dressing/Undressing Lower body dressing      What is the patient wearing?: Underwear/pull up, Pants     Lower body assist Assist for lower body dressing: Contact Guard/Touching assist     Toileting Toileting    Toileting assist Assist  for toileting: Contact Guard/Touching assist     Transfers Chair/bed transfer  Transfers assist     Chair/bed transfer assist level: Minimal Assistance - Patient > 75%     Locomotion Ambulation   Ambulation assist      Assist level: Minimal Assistance - Patient > 75% Assistive device: Walker-rolling Max distance: 180'   Walk 10 feet activity   Assist     Assist level: Minimal Assistance - Patient > 75% Assistive device: Walker-Eva   Walk 50 feet activity   Assist    Assist level: Minimal Assistance - Patient > 75% Assistive device: Walker-rolling    Walk 150 feet activity   Assist    Assist level: Minimal Assistance - Patient > 75% Assistive device: Walker-rolling    Walk 10 feet on uneven surface  activity   Assist     Assist level: Minimal Assistance - Patient > 75% Assistive device: Walker-rolling    Wheelchair     Assist Is the patient using a wheelchair?: Yes Type of Wheelchair: Manual    Wheelchair assist level: Dependent - Patient 0% Max wheelchair distance: 150'    Wheelchair 50 feet with 2 turns activity    Assist        Assist Level: Dependent - Patient 0%   Wheelchair 150 feet activity     Assist      Assist Level: Dependent - Patient 0%   Blood pressure (!) 131/57, pulse (!) 59, temperature 97.9 F (36.6 C), resp. rate 18, height 5' 3.5 (1.613 m), weight 80.9 kg, SpO2 100%.  Medical Problem List and Plan: 1. 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  Antithrombotics: -DVT/anticoagulation:  Mechanical: Sequential compression devices, below knee Bilateral lower extremities             -antiplatelet therapy: resume ASA 81 mg 7/4 3. Pain Management: No meds ordered- pt denies pain- cannot have tylenol  or NSAIDs due to LFT elevation/autoimmune hepatitis  4. Mood/Behavior/Sleep: LCSW to follow for evaluation and support when available.              -antipsychotic agents: N/A 5. Neuropsych/cognition: This patient is capable of making decisions on her own behalf. 6. Skin/Wound Care: Routine pressure relief measures  7. Fluids/Electrolytes/Nutrition: CMET in a.m   -Daily weights- Strict I&O              -Renal w/ 1200 fluid restrict              -Severe protien calorie malnutrition: add Glucerna - Alb 1.6- concern that it's due to her hepatitis- will monitor -recheck next Monday             -Vitamin C             7/4- changed Fluid restriction to 1800cc 8. Hypertension: holding home amlodipine   -continue Lasix  40 mg P.O twice daily              - CTM BPs closely  7/7- BP well controlled- on Lasix  40 mg BID-  9. Hypokalemia: K+ now 3.3 Goal K+ > 4.0 and Mag > 2.0.   -continue potassium 20 meq daily              -  will recheck levels in AM  7/4- K+ 3.5 this AM- might need to increase to 40 mEq  7/7- K+ up to 3.8 this AM 10. Transaminitis/Elevated Liver Enzyme due to Autoimmune hepatitis/pancreatic cyst/dilated bile duct: Monitor Hepatic panel + CBC w/diff -Repeat LFT's on 7/7 and 7/10 per GI  -Prednisone  40 mg started 07/06/23- Taper by 5 mg/week until done.              TMPT-pending may need to start Thiopurine if normal  -will need ASAP f/u with GI and hepatology after d/c- GI will attempt to work up pancreatic cyst and bile duct dilation while here.   11. Hyperlipidemia: continue holding atorvastatin  due to abnormal LFTs.  12. Glaucoma: continue Dorzolamide -Timolol  and Latanoprost    13. Pre-Diabetes: A1c 6.2. now on prednisone               -monitor BS ac/hs and use SSI for elevated BS due to high doses of Prednisone   7/4- BG dropped to 69 overnight- but will order a night snack to help  7/7- Cbgs 96-166-- con't regimen 14.  Class I obesity: Counsel on diet modifications and exercise to facilitate weight loss.  15. Leukocytosis- just started Prednisone  today- I'm not sure would cause WBC to go to 19k from 10.9k so fast- will recheck in AM and monitor clinically for possible infection  7/4- WBC 19.3- since not still going up, I think more likely from Prednisone - just started after 1 dose of Prednisone -   16. Thrombocytopenia- Plts dropped to 107k today from 155k- will recheck in AM- if better, will check Monday/Thursdays- to reduce lab draws  7/4- Plt sup to 157k   17. ARF s/p E Coli- with improving renal function  7/4- Cr down to 1.57 and BUN slightly up to 41 (not drinking well)  -encouraged pt to drink more- also will make fluid restriction 1800cc now  7/7- Cr down to 1.42  from 1.57 and BUN 33 form 41 18. Lower extremity edema: worse as per patient, additional Lasix  20mg  administered 7/5, discussed with patient and she feels edema improved 7/6  and does not want additional lasix   7/7- Will add TEDS due to Sx's of lightheadedness- just in case- no documentation of low BP.   I spent a total of  37  minutes on total care today- >50% coordination of care- due to  Review of labs, vitals and B/B- as well as weights- also specifically of LFT's- 283 and 271 down from 500's     LOS: 4 days A FACE TO FACE EVALUATION WAS PERFORMED  Terryann Verbeek 07/11/2023, 8:31 AM

## 2023-07-12 DIAGNOSIS — G7281 Critical illness myopathy: Secondary | ICD-10-CM | POA: Diagnosis not present

## 2023-07-12 LAB — GLUCOSE, CAPILLARY
Glucose-Capillary: 78 mg/dL (ref 70–99)
Glucose-Capillary: 85 mg/dL (ref 70–99)
Glucose-Capillary: 203 mg/dL — ABNORMAL HIGH (ref 70–99)
Glucose-Capillary: 159 mg/dL — ABNORMAL HIGH (ref 70–99)

## 2023-07-12 MED ORDER — PREDNISONE 5 MG PO TABS: 5.0000 mg | ORAL_TABLET | Freq: Every day | ORAL | Status: DC

## 2023-07-12 MED ORDER — PREDNISONE 5 MG PO TABS: 10.0000 mg | ORAL_TABLET | Freq: Every day | ORAL | Status: DC

## 2023-07-12 MED ORDER — PREDNISONE 20 MG PO TABS: 20.0000 mg | ORAL_TABLET | Freq: Every day | ORAL | Status: DC

## 2023-07-12 MED ORDER — PREDNISONE 5 MG PO TABS: 30.0000 mg | ORAL_TABLET | Freq: Every day | ORAL | Status: DC

## 2023-07-12 MED ORDER — PREDNISONE 5 MG PO TABS: 15.0000 mg | ORAL_TABLET | Freq: Every day | ORAL | Status: DC

## 2023-07-12 MED ORDER — PREDNISONE 5 MG PO TABS
35.0000 mg | ORAL_TABLET | Freq: Every day | ORAL | Status: DC
  Administered 2023-07-13 – 2023-07-18 (×6): 35 mg via ORAL
  Filled 2023-07-12 (×6): qty 3

## 2023-07-12 MED ORDER — PREDNISONE 5 MG PO TABS: 25.0000 mg | ORAL_TABLET | Freq: Every day | ORAL | Status: DC

## 2023-07-12 NOTE — Progress Notes (Signed)
 Ok to start prednisone  taper starting 7/9 per Dr. Lovorn.  Sergio Batch, PharmD, BCIDP, AAHIVP, CPP Infectious Disease Pharmacist 07/12/2023 8:49 AM

## 2023-07-12 NOTE — Progress Notes (Signed)
 Physical Therapy Session Note  Patient Details  Name: Marissa Orozco MRN: [id number] Date of Birth: 1945-08-29  Today's Date: 07/12/2023 PT Individual Time: 1345-1500 PT Individual Time Calculation (min): 75 min   Short Term Goals: Week 1:  PT Short Term Goal 1 (Week 1): STG = LTG due to ELOS  Skilled Therapeutic Interventions/Progress Updates:    Pt in w/c waiting to use restroom.  Pt able to toilet self with mod indep using rollator.  Pt ambulated with rollator from room to [place] corridor with supervision cueing for heel toe gait pattern; after resting, pt amb to ortho gym donned 1.5# ankle weight to LLE to main gym. Pt performed standing ex's   Therapy Documentation Precautions:  Precautions Precautions: Fall Recall of Precautions/Restrictions: Intact Restrictions Weight Bearing Restrictions Per Provider Order: No  Pain: no pain       Therapy/Group: Individual Therapy  Arland GORMAN Fast 07/12/2023, 4:23 PM

## 2023-07-12 NOTE — Progress Notes (Signed)
 Physical Therapy Session Note  Patient Details  Name: Marissa Orozco MRN: [id number] Date of Birth: 18-Nov-1945  Today's Date: 07/12/2023 PT Individual Time: 0830-0930 PT Individual Time Calculation (min): 60 min   Short Term Goals: Week 1:  PT Short Term Goal 1 (Week 1): STG = LTG due to ELOS  Skilled Therapeutic Interventions/Progress Updates:    Pt up in w/c when PT arrived, agreeable to therapy.  Pt c/o LE edema stating, they are about to pop!.  PT applied TED hose bilateral LE.  Pt denies pain. Pt ambulated to main gym with rollator with supervision min cueing for heel toe gait pattern intermittently. STS from mat without UE support x5, with 4# med ball x5, with 2# med ball x5; 6 step up x1 HHA 3x 30, seated red TB marching, clamshells, LAQs, HS curls, DF/EVER/INV x15 reps each.  Pt amb backed to room with rollator.  Pt presents with LLE weakness and noted with decreased LLE clearance during swing phase of gait.  Pt fatigued post tx, sat up in w/c end of session with items within reach.    Therapy Documentation Precautions:  Precautions Precautions: Fall Recall of Precautions/Restrictions: Intact Restrictions Weight Bearing Restrictions Per Provider Order: No   Pain: Pain Assessment Pain Scale: 0-10 Pain Score: 0-No pain    Therapy/Group: Individual Therapy  Arland GORMAN Fast 07/12/2023, 12:21 PM

## 2023-07-12 NOTE — Progress Notes (Addendum)
 Occupational Therapy Session Note  Patient Details  Name: Marissa Orozco MRN: [id number] Date of Birth: 07/15/1945  Today's Date: 07/12/2023 OT Individual Time: 1010-1100 OT Individual Time Calculation (min): 50 min  Missed minutes: 10 minutes; see flowsheets   Short Term Goals: Week 1:  OT Short Term Goal 1 (Week 1): STGs=LTGs due to patient's estimated length of stay.  Skilled Therapeutic Interventions/Progress Updates:      Therapy Documentation Precautions:  Precautions Precautions: Fall Recall of Precautions/Restrictions: Intact Restrictions Weight Bearing Restrictions Per Provider Order: No General: Pt supine in bed upon OT arrival, agreeable to OT session.   Pain: no pain reported   ADL: OT providing skilled intervention on ADL retraining in order to increase independence with tasks and increase activity tolerance. Pt completed the following tasks at the current level of assist: Grooming/oral hygiene: seated in W/C brushing teeth and grooming hair, able to reach out of BOS in order to reach UB dressing: SBA in standing donning/doffing overhead shirt, no LOB LB dressing: standing to doff using unilateral support from RW, seated to don over feet, VC for dressing weaker leg first, completed in standing with unilateral support on grab bar Footwear: SBA seated in W/C donning/doffing slip on shoes and TED hose Shower transfer: SBA with no AD with VC for not furniture walking for safety Bathing: SBA, standing to cleanse peri area using grab bars, seated on TTB for rest of shower  Pt also able to gather toiletries out of close standing reaching out of BOS to gather items at SBA without LOB.  Balance: Pt completed a variety of standing activities in order to promote increased balance strategies with ADL participation. Pt completed all activities at unsupported standing level. Pt completed folding large towels in unsupported standing at SBA and no LOB and reaching out of BOS to  place into laundry basket. Pt then able to transport light laundry basket with CGA ~10 ft.    Therapy/Group: Individual Therapy  Camie Hoe, OTD, OTR/L 07/12/2023, 11:12 AM

## 2023-07-12 NOTE — Progress Notes (Signed)
 Patient ID: Marissa Orozco, female   DOB: 1945/10/21, 78 y.o.   MRN: [id number]  met with pt to give team conference update regarding goals of mod/I level and discharge date of 7/14. She reports her main issues are her fatigue and endurance issues from being so sick and in the hospital. Discussed possible equipment need of rollator and home health follow up. Will continue to work on discharge needs.

## 2023-07-12 NOTE — Progress Notes (Signed)
 PROGRESS NOTE   Subjective/Complaints:  Pt reports had a bell tone sounding all night- but did still sleep.  LBM yesterday.  Concerned that Bg's are somewhat labile- 77-199 per pt.  - per chart was 78-200 in last 24 hours-  Wanted to start metformin, but with Cr, don't feel like I can.  Will add Night snack for low BG this AM  Does note has more LE swelling - wearing TEDs.  ROS:   Pt denies SOB, abd pain, CP, N/V/C/D, and vision changes Per HPI   Objective:   No results found. Recent Labs    07/11/23 0737  WBC 17.1*  HGB 13.5  HCT 39.1  PLT 200    Recent Labs    07/11/23 0600  NA 137  K 3.8  CL 106  CO2 19*  GLUCOSE 73  BUN 33*  CREATININE 1.42*  CALCIUM  8.1*    Intake/Output Summary (Last 24 hours) at 07/12/2023 0959 Last data filed at 07/12/2023 0746 Gross per 24 hour  Intake 840 ml  Output --  Net 840 ml        Physical Exam: Vital Signs Blood pressure 110/62, pulse 64, temperature 97.9 F (36.6 C), temperature source Oral, resp. rate 18, height 5' 3.5 (1.613 m), weight 80.3 kg, SpO2 99%.      General: awake, alert, appropriate, sitting up in w/c in room- nursing in room; NAD HENT: conjugate gaze; oropharynx moist CV: regular rate and rhythm; no JVD Pulmonary: CTA B/L; no W/R/R- good air movement GI: soft, NT, ND, (+)BS-  normoactive Psychiatric: appropriate- very quiet, interactive, but need to question to get responses Neurological: Ox3  MSK: 7/7- B/L LE's - HF 3/5 B/L- otherwise 4+/5 in LE's.  Musculoskeletal:     Cervical back: Normal range of motion and neck supple.     Comments: Ue's- Deltoids 4-/5 otherwise 5-/5 throughout in Ue's LE's- RLE- HF 2+/5; 4+/5 otherwise LLE- HF 2-/5 KE/KF/DF/PF all 4+/5 otherwise, stable 7/6   Skin:    General: Skin is warm and dry.     Capillary Refill: Capillary refill takes less than 2 seconds.     Coloration: Skin is pale.     Comments:  No skin breakdown seen  Neurological:     Mental Status: She is alert.     Sensory: No sensory deficit.     Comments: Intact to light touch in all 4 extremities Voice OK, but sounds somewhat weak Ox3 and appropriate- but slightly delayed responses Assessment/Plan: 1. Functional deficits which require 3+ hours per day of interdisciplinary therapy in a comprehensive inpatient rehab setting. Physiatrist is providing close team supervision and 24 hour management of active medical problems listed below. Physiatrist and rehab team continue to assess barriers to discharge/monitor patient progress toward functional and medical goals  Care Tool:  Bathing    Body parts bathed by patient: Right arm, Left arm, Chest, Abdomen, Front perineal area, Buttocks, Right upper leg, Left upper leg, Right lower leg, Left lower leg, Face         Bathing assist Assist Level: Contact Guard/Touching assist     Upper Body Dressing/Undressing Upper body dressing   What is the patient wearing?:  Pull over shirt, Bra    Upper body assist Assist Level: Set up assist    Lower Body Dressing/Undressing Lower body dressing      What is the patient wearing?: Underwear/pull up, Pants     Lower body assist Assist for lower body dressing: Contact Guard/Touching assist     Toileting Toileting    Toileting assist Assist for toileting: Contact Guard/Touching assist     Transfers Chair/bed transfer  Transfers assist     Chair/bed transfer assist level: Supervision/Verbal cueing     Locomotion Ambulation   Ambulation assist      Assist level: Supervision/Verbal cueing Assistive device: Walker-rolling Max distance: 20'   Walk 10 feet activity   Assist     Assist level: Independent with assistive device Assistive device: Walker-rolling   Walk 50 feet activity   Assist    Assist level: Minimal Assistance - Patient > 75% Assistive device: Walker-rolling    Walk 150 feet  activity   Assist    Assist level: Minimal Assistance - Patient > 75% Assistive device: Walker-rolling    Walk 10 feet on uneven surface  activity   Assist     Assist level: Minimal Assistance - Patient > 75% Assistive device: Walker-rolling   Wheelchair     Assist Is the patient using a wheelchair?: Yes Type of Wheelchair: Manual    Wheelchair assist level: Dependent - Patient 0% Max wheelchair distance: 150'    Wheelchair 50 feet with 2 turns activity    Assist        Assist Level: Dependent - Patient 0%   Wheelchair 150 feet activity     Assist      Assist Level: Dependent - Patient 0%   Blood pressure 110/62, pulse 64, temperature 97.9 F (36.6 C), temperature source Oral, resp. rate 18, height 5' 3.5 (1.613 m), weight 80.3 kg, SpO2 99%.  Medical Problem List and Plan: 1. 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  Antithrombotics: -DVT/anticoagulation:  Mechanical: Sequential compression devices, below knee Bilateral lower extremities             -antiplatelet therapy: resume ASA 81 mg 7/4 3. Pain Management: No meds ordered- pt denies pain- cannot have tylenol  or NSAIDs due to LFT elevation/autoimmune hepatitis  4. Mood/Behavior/Sleep: LCSW to follow for evaluation and support when available.              -antipsychotic agents: N/A 5. Neuropsych/cognition: This patient is capable of making decisions on her own behalf. 6. Skin/Wound Care: Routine pressure relief measures  7. Fluids/Electrolytes/Nutrition: CMET in a.m   -Daily weights- Strict I&O              -Renal w/ 1200 fluid restrict              -Severe protien calorie malnutrition: add Glucerna - Alb 1.6- concern that it's due to her hepatitis- will monitor  -recheck next Monday             -Vitamin C             7/4- changed Fluid restriction to  1800cc  7/8- Albumin up to 1.8  -spoke to Renal- they recommended stopping fluid restriction- and stop Lasix - use only as needed-for edema-  since doesn't have CHF- also put on 2G sodium diet per renal-  8. Hypertension: holding home amlodipine   -continue Lasix  40 mg P.O twice daily              - CTM BPs closely  7/8- BP controlled- taking off Lasix  but that rarely affects BP significantly- con't to monitor 9. Hypokalemia: K+ now 3.3 Goal K+ > 4.0 and Mag > 2.0.  -continue potassium 20 meq daily              -will recheck levels in AM  7/4- K+ 3.5 this AM- might need to increase to 40 mEq  7/7- K+ up to 3.8 this AM  7/8- stopped Lasix  so stopped KCl 10. Transaminitis/Elevated Liver Enzyme due to Autoimmune hepatitis/pancreatic cyst/dilated bile duct: Monitor Hepatic panel + CBC w/diff -Repeat LFT's on 7/7 and 7/10 per GI  -Prednisone  40 mg started 07/06/23- Taper by 5 mg/week until done.              TMPT-pending may need to start Thiopurine if normal  -will need ASAP f/u with GI and hepatology after d/c- GI will attempt to work up pancreatic cyst and bile duct dilation while here.   11. Hyperlipidemia: continue holding atorvastatin  due to abnormal LFTs.  12. Glaucoma: continue Dorzolamide -Timolol  and Latanoprost    13. Pre-Diabetes: A1c 6.2. now on prednisone               -monitor BS ac/hs and use SSI for elevated BS due to high doses of Prednisone   7/4- BG dropped to 69 overnight- but will order a night snack to help  7/7- Cbgs 96-166-- con't regimen  7/8- Cbgs 78 to 200- will not be able to start metformin due to Cr, however will add nighttime snack- also d/w pt so she can ask for it 14.  Class I obesity: Counsel on diet modifications and exercise to facilitate weight loss.  15. Leukocytosis- just started Prednisone  today- I'm not sure would cause WBC to go to 19k from 10.9k so fast- will recheck  in AM and monitor clinically for possible infection  7/4- WBC 19.3- since not still going up, I think more likely from Prednisone - just started after 1 dose of Prednisone -   7/8- down to 17.1k- will con't to monitor- recheck Thursday 16. Thrombocytopenia- Plts dropped to 107k today from 155k- will recheck in AM- if better, will check Monday/Thursdays- to reduce lab draws  7/4- Plt sup to 157k   17. ARF s/p E Coli- with improving renal function  7/4- Cr down to 1.57 and BUN slightly up to 41 (not drinking well)  -encouraged pt to drink more- also will make fluid restriction 1800cc now  7/7- Cr down to 1.42  from 1.57 and BUN 33 form 41  7/8- stopped fluid restriction and  Lasix  40 mg BID per d/w renal- put on 2G sodium diet 18. Lower extremity edema: worse as per patient, additional Lasix  20mg  administered 7/5, discussed with patient and she feels edema improved 7/6 and does not want additional lasix   7/7- Will add TEDS due to Sx's of lightheadedness- just in case- no documentation of low BP.    I spent a total of 53   minutes on total care today- >50% coordination of care- due to  D/w renal- d/w PA as wlel about care- and nursing about stopping  fluid restriction- also team conference to determine length of stay   LOS: 5 days A FACE TO FACE EVALUATION WAS PERFORMED  Serenidy Waltz 07/12/2023, 9:59 AM

## 2023-07-12 NOTE — Plan of Care (Signed)
  Problem: Consults Goal: RH GENERAL PATIENT EDUCATION Description: See Patient Education module for education specifics. Outcome: Progressing   Problem: RH BOWEL ELIMINATION Goal: RH STG MANAGE BOWEL WITH ASSISTANCE Description: STG Manage Bowel with  mod I Assistance. Outcome: Progressing   Problem: RH BLADDER ELIMINATION Goal: RH STG MANAGE BLADDER WITH ASSISTANCE Description: STG Manage Bladder With mod I  Assistance Outcome: Progressing   Problem: RH SKIN INTEGRITY Goal: RH STG SKIN FREE OF INFECTION/BREAKDOWN Description: Manage skin free of infection/ breakdown with mod I assistance Outcome: Progressing   Problem: RH SAFETY Goal: RH STG ADHERE TO SAFETY PRECAUTIONS W/ASSISTANCE/DEVICE Description: STG Adhere to Safety Precautions With mod I assistance Assistance/Device. Outcome: Progressing   Problem: RH PAIN MANAGEMENT Goal: RH STG PAIN MANAGED AT OR BELOW PT'S PAIN GOAL Description: <4 w/ prns Outcome: Progressing   Problem: RH KNOWLEDGE DEFICIT GENERAL Goal: RH STG INCREASE KNOWLEDGE OF SELF CARE AFTER HOSPITALIZATION Description: Manage increase knowledge of self care after hospitalization with mod I assistance using educational materials provided Outcome: Progressing

## 2023-07-13 ENCOUNTER — Telehealth: Payer: Self-pay

## 2023-07-13 ENCOUNTER — Other Ambulatory Visit: Payer: Self-pay

## 2023-07-13 DIAGNOSIS — R945 Abnormal results of liver function studies: Secondary | ICD-10-CM

## 2023-07-13 DIAGNOSIS — G7281 Critical illness myopathy: Secondary | ICD-10-CM | POA: Diagnosis not present

## 2023-07-13 DIAGNOSIS — K862 Cyst of pancreas: Secondary | ICD-10-CM

## 2023-07-13 DIAGNOSIS — R7401 Elevation of levels of liver transaminase levels: Secondary | ICD-10-CM

## 2023-07-13 LAB — GLUCOSE, CAPILLARY
Glucose-Capillary: 115 mg/dL — ABNORMAL HIGH (ref 70–99)
Glucose-Capillary: 76 mg/dL (ref 70–99)
Glucose-Capillary: 126 mg/dL — ABNORMAL HIGH (ref 70–99)
Glucose-Capillary: 188 mg/dL — ABNORMAL HIGH (ref 70–99)

## 2023-07-13 NOTE — Telephone Encounter (Signed)
 Labs have been entered  EUS has been entered for 09/15/23 at 115 pm at [facility] with GM

## 2023-07-13 NOTE — Progress Notes (Signed)
 Physical Therapy Session Note  Patient Details  Name: Marissa Orozco MRN: [id number] Date of Birth: 1945/01/27  Today's Date: 07/13/2023 PT Individual Time: 9054-8954 PT Individual Time Calculation (min): 60 min   Short Term Goals: Week 1:  PT Short Term Goal 1 (Week 1): STG = LTG due to ELOS  Skilled Therapeutic Interventions/Progress Updates:    Pt sitting up EOB when PT arrived, pt agreeable for PT session.  Pt states her scratch on left lower leg was oozing so a band aid was applied.  Pt denies any pain, feels her legs are getting better.  Pt noticed a small lesion on her right upper thigh, no pain/bleeding. Session focusing on dynamic balance, endurance, LE strength mostly hip flexors L>R.  Pt using rollator for gait, demonstrating good heel strike without cueing, no shuffled pattern throughout session.  Therex: 2# ankle weights: seated LAQs, marching, ankle heel/toe raises 2x15 each; standing with rollator: heel raises, marching, hip extension/abd x15 reps. Obstacle course stepping over 5 cones ~2 ft apart with CGA forward/side to side movement x2 trials without AD/CGA. Ramp negotiation without AD using x1 railing for support, uneven surface with railing for support.  Standing balance on uneven surface SLS EO alternating LE with 1-2 HHA on railing, max tolerance x5 RLE, x3 LLE, pt unable to perform with EC, side stepping with PT HHA x2 ~5' each way. Pt requires 3-4 rest breaks during today's session, no c/o pain, SOB. Pt has difficulty with lifting/controlling LLE with obstacle course due to weakness.  Pt ambulated back to room with rollator with supervision, transferred to w/c with bilateral LE elevated.    30 STS: 22 reps from mat   Therapy Documentation Precautions:  Precautions Precautions: Fall Recall of Precautions/Restrictions: Intact Restrictions Weight Bearing Restrictions Per Provider Order: No    Pain: Pain Assessment Pain Scale: 0-10 Pain Score: 0-No  pain     Therapy/Group: Individual Therapy  Arland GORMAN Fast 07/13/2023, 12:41 PM

## 2023-07-13 NOTE — Telephone Encounter (Signed)
-----   Message from [facility] sent at 07/13/2023  6:34 AM EDT ----- Regarding: RE: your patient - EUS Thanks for update. Too much coordination with the [facility] team is needed when they are already inundated with procedures/consults and this patient is not on their list. Why was CA19-9 drawn? I'll have Kevis Qu work on coordinating on August schedule for EUS. GM  Reiss Mowrey, EUS Upper is needed on this patient. Please schedule in August or early September. She needs repeat labs (CMP/INR/GGT/Immunoglobulins) in 1-2 weeks. Thanks. GM ----- Message ----- From: Leigh Elspeth SQUIBB, MD Sent: 07/07/2023   2:27 PM EDT To: Elspeth SQUIBB Leigh, MD; Aloha Finner # Subject: FW: your patient - EUS                         Marissa Orozco -   Just to follow up on this. Patient was discharged to rehab today - on  prednisone  40mg  for a week and then a slow taper by 5mg / week until done. She is getting LFTs done there on Monday.  She will be at [facility] for rehab on ward [address] for several days. She needs an EUS for her biliary tree and pancreatic cyst with elevated CA 19-9. Not sure when you are purple this week if you would want to / have time to evaluate it while she is in rehab. If not defer to you when you would want to do that.   LFTs have started to downtrend on steroids.  Let me know if any questions, very nice lady.  SA ----- Message ----- From: Leigh Elspeth SQUIBB, MD Sent: 07/06/2023   3:07 PM EDT To: Aloha Finner Raddle., MD Subject: your patient - EUS                             Marissa Orozco, I think this is your primary patient.  She has been in the hospital for an AKI and we were consulted for elevated liver enzymes.  We did full workup including liver biopsy and it looks like she has autoimmune hepatitis.  I am starting her on steroids, checking TPMT.  She incidentally also has a fusiform dilation of her bile duct and a pretty large pancreatic cyst with an elevated CA 19-9.  I think she needs an  EUS for that.  I spoke about her case with Anselm during inpatient stay and he did not think she was obstructed.  Not sure if you have capability to do her EUS or not anytime soon.  I think she may be in the hospital another day or 2 and then home over the holiday.  She will need follow-up  for LFTs over the next few weeks while she is in rehab.  I am working with the hospitalist to make sure we have appropriate follow-up for her and ability to do labs while she is in rehab.  Will follow-up more when she is actually leaving the hospital

## 2023-07-13 NOTE — Progress Notes (Signed)
 Physical Therapy Session Note  Patient Details  Name: Marissa Orozco MRN: [id number] Date of Birth: 1945-07-02  Today's Date: 07/13/2023 PT Individual Time: 1300-1400 PT Individual Time Calculation (min): 60 min   Short Term Goals: Week 1:  PT Short Term Goal 1 (Week 1): STG = LTG due to ELOS  Skilled Therapeutic Interventions/Progress Updates:    Pt sitting up in w/c with legs elevated in room and agreeable for PT session.  No change for pain since earlier today.  Session focusing on endurance with gait distance, general strengthening.  Pt ambulated from room to ortho room without rollator.  Pt c/o fatigue, noted laboured breathing and needing to rest.  Pt using rollator for remainder of gait distance. Pt ambulated from ortho room to [facility] elevators to first floor and back to elevators to day gym without rest using rollator. NuStep x15' level 5 for 4', then level 7 for 11' using UE/LE at comfortable pace for patient. Day gym to room, pt able to ambulate without rollator and holding her cup of water in right hand.  Pt able to demonstrate improved normalized heel to toe gait pattern without vc, no LOB when incorporating dual tasks with dynamic mobility. Pt returned to w/c in room, LE elevated, husband present.    Therapy Documentation Precautions:  Precautions Precautions: Fall Recall of Precautions/Restrictions: Intact Restrictions Weight Bearing Restrictions Per Provider Order: No  Pain: Pain Assessment Pain Scale: 0-10 Pain Score: 0-No pain    Therapy/Group: Individual Therapy  Arland GORMAN Fast 07/13/2023, 3:12 PM

## 2023-07-13 NOTE — Patient Care Conference (Signed)
 Inpatient RehabilitationTeam Conference and Plan of Care Update Date: 07/12/2023   Time: 11:33 AM    Patient Name: Marissa Orozco      Medical Record Number: [id number]  Date of Birth: [birth year] Sex: Female         Room/Bed: 4W25C/4W25C-01 Payor Info: Payor: MEDICARE / Plan: MEDICARE PART A AND B / Product Type: *No Product type* /    Admit Date/Time:  07/07/2023 12:56 PM  Primary Diagnosis:  Intensive care (ICU) myopathy  Hospital Problems: Principal Problem:   Intensive care (ICU) myopathy Active Problems:   AKI (acute kidney injury) [facility])    Expected Discharge Date: Expected Discharge Date: 07/18/23  Team Members Present: Physician leading conference: Dr. Duwaine Barrs Social Worker Present: Rhoda Clement, LCSW Nurse Present: Barnie Ronde, RN PT Present: Donald Pereyra, PT;Other (comment) Lessie PT) OT Present: Camie Hoe, OT PPS Coordinator present : Eleanor Colon, SLP     Current Status/Progress Goal Weekly Team Focus  Bowel/Bladder   pt is continent of b/b   remain continent   offer toileting q4h/ prn    Swallow/Nutrition/ Hydration               ADL's   SBA overall   mod I   dynamic balance, D/C planning    Mobility   supervision with transfers/gait with rollator, fatigues easily   mod indep  strengthening, endurance    Communication                Safety/Cognition/ Behavioral Observations               Pain   no c/o pain   manage pain level of <3   assess pain level q shift/prn    Skin   skin is intact   remain intact and moisture free  assess skin q shift/ prn      Discharge Planning:  HOme with husband who is in good health and can assist her if needed. Awaiting team's recommendations for follow up and DME   Team Discussion: Patient admitted with ICU myopathy post autoimmune hepatitis with edematous feet on Lasix .  Functional progress limited by fatigue, with a decreased albumin level.  Patient on target to meet rehab  goals: yes, currently needs supervision for ADLs. Able to ambulate with supervision using a rollator; fatigues easily. Goals for discharge set for mod I overall.  *See Care Plan and progress notes for long and short-term goals.   Revisions to Treatment Plan:  MD adjust prednisone  level with platelet level normalizing Fluid restrictions lifted; encourage increased protein intake Nephrology consult   Teaching Needs: Safety, energy conservation tips, medications, transfers, toileting, etc.   Current Barriers to Discharge: Decreased caregiver support  Possible Resolutions to Barriers: Family education HH follow up services     Medical Summary Current Status: contient B/B- LBM 7/6- no skin issues- ICU myopathy-  Barriers to Discharge: Behavior/Mood;Electrolyte abnormality;Inadequate Nutritional Intake;Medical stability;Morbid Obesity;Self-care education;Renal Insufficiency/Failure  Barriers to Discharge Comments: limited by ICU myopathy- goals mod I- fatigues easily- poor endurance- lots of rest breaks; renal function and LAsix - mild LE edema- OH over weekend- Possible Resolutions to [employer] Focus: will arrange for H/H therapies at d/c-; stopped Lasix  40 mg BID- and KCL; renal side consult for Renal funciton- took off fluid restriction- d/c- 7/14-   Continued Need for Acute Rehabilitation Level of Care: The patient requires daily medical management by a physician with specialized training in physical medicine and rehabilitation for the following reasons: Direction of a multidisciplinary  physical rehabilitation program to maximize functional independence : Yes Medical management of patient stability for increased activity during participation in an intensive rehabilitation regime.: Yes Analysis of laboratory values and/or radiology reports with any subsequent need for medication adjustment and/or medical intervention. : Yes   I attest that I was present, lead the team conference,  and concur with the assessment and plan of the team.   Fredericka Sober B 07/13/2023, 9:05 AM

## 2023-07-13 NOTE — Progress Notes (Signed)
 Occupational Therapy Session Note  Patient Details  Name: Marissa Orozco MRN: [id number] Date of Birth: 1945/04/07  Today's Date: 07/13/2023 OT Individual Time: 9199-9154 OT Individual Time Calculation (min): 45 min    Short Term Goals: Week 1:  OT Short Term Goal 1 (Week 1): STGs=LTGs due to patient's estimated length of stay.  Skilled Therapeutic Interventions/Progress Updates:      Therapy Documentation Precautions:  Precautions Precautions: Fall Recall of Precautions/Restrictions: Intact Restrictions Weight Bearing Restrictions Per Provider Order: No General:  Pt seated in W/C upon OT arrival, agreeable to OT.  Pain: no pain reported, pt concerned about swelling in legs, OT educating on movement, elevation when sitting and in bed for decreased edema  ADL: OT providing skilled intervention on ADL retraining in order to increase independence with tasks and increase activity tolerance. Pt completed the following tasks at the current level of assist: Grooming/oral hygiene: seated in W/C brushing teeth and grooming hair, able to reach out of BOS in order to reach UB dressing: SBA in standing donning/doffing overhead shirt, no LOB LB dressing: standing to doff using unilateral support from RW, seated to don over feet, completed in standing for  over waist with unilateral support on grab bar Footwear: SBA seated in W/C donning/doffing slip on shoes and TED hose Shower transfer: SBA with no AD with VC for not furniture walking for safety Bathing: SBA, OT challenging pt to complete in standing using grab bars for support if needed, pt able to stand for shower without LOB, fatigued after task with seated rest break for managing clothing   Pt supine in bed with feet elevated for edema, bed alarm activated, 2 bed rails up, call light within reach and 4Ps assessed.   Therapy/Group: Individual Therapy  Camie Hoe, OTD, OTR/L 07/13/2023, 12:49 PM

## 2023-07-13 NOTE — Progress Notes (Signed)
 PROGRESS NOTE   Subjective/Complaints:  Pt reports more swelling- likely from stopping Lasix - but it was a high dose for 1-2+ LE edema (was started due to renal dysfunction, not edema of note).   Pt reports scratched her medial ankle- was bleeding- has stopped.  Right where the line of her socks are.  Vision was slightly to somewhat blurry yesterday AMN- educated her this can occur when her BG is higher- usually >200 when this occurs- resolved by yesterday afternoon.  Did get snack last night and BG better this AM  LBM 2 days ago. Doesn't feel constipated.  ROS:   Pt denies SOB, abd pain, CP, N/V/C/D, and vision changes  Per HPI   Objective:   No results found. Recent Labs    07/11/23 0737  WBC 17.1*  HGB 13.5  HCT 39.1  PLT 200    Recent Labs    07/11/23 0600  NA 137  K 3.8  CL 106  CO2 19*  GLUCOSE 73  BUN 33*  CREATININE 1.42*  CALCIUM  8.1*    Intake/Output Summary (Last 24 hours) at 07/13/2023 0945 Last data filed at 07/12/2023 1817 Gross per 24 hour  Intake 240 ml  Output --  Net 240 ml        Physical Exam: Vital Signs Blood pressure (!) 139/58, pulse 70, temperature 98.1 F (36.7 C), temperature source Oral, resp. rate 18, height 5' 3.5 (1.613 m), weight 80.2 kg, SpO2 98%.       General: awake, alert, appropriate, sitting up in w/c at bedside; NAD HENT: conjugate gaze; oropharynx moist CV: regular rate and rhythm; no JVD Pulmonary: CTA B/L; no W/R/R- good air movement GI: soft, NT, ND, (+)BS Psychiatric: appropriate- quiet Neurological: Ox3 Extremities: 1-2+ LE edema to ankles B/L Skin- mild shine to swollen feet/ankles- appears like mole/skin lesions scraped and had been bleeding- is not now MSK: 7/7- B/L LE's - HF 3/5 B/L- otherwise 4+/5 in LE's.  Musculoskeletal:     Cervical back: Normal range of motion and neck supple.     Comments: Ue's- Deltoids 4-/5 otherwise 5-/5  throughout in Ue's LE's- RLE- HF 2+/5; 4+/5 otherwise LLE- HF 2-/5 KE/KF/DF/PF all 4+/5 otherwise, stable 7/6   Skin:    General: Skin is warm and dry.     Capillary Refill: Capillary refill takes less than 2 seconds.     Coloration: Skin is pale.     Comments: No skin breakdown seen  Neurological:     Mental Status: She is alert.     Sensory: No sensory deficit.     Comments: Intact to light touch in all 4 extremities Voice OK, but sounds somewhat weak Ox3 and appropriate- but slightly delayed responses Assessment/Plan: 1. Functional deficits which require 3+ hours per day of interdisciplinary therapy in a comprehensive inpatient rehab setting. Physiatrist is providing close team supervision and 24 hour management of active medical problems listed below. Physiatrist and rehab team continue to assess barriers to discharge/monitor patient progress toward functional and medical goals  Care Tool:  Bathing    Body parts bathed by patient: Right arm, Left arm, Chest, Abdomen, Front perineal area, Buttocks, Right upper leg, Left upper  leg, Right lower leg, Left lower leg, Face         Bathing assist Assist Level: Contact Guard/Touching assist     Upper Body Dressing/Undressing Upper body dressing   What is the patient wearing?: Pull over shirt, Bra    Upper body assist Assist Level: Set up assist    Lower Body Dressing/Undressing Lower body dressing      What is the patient wearing?: Underwear/pull up, Pants     Lower body assist Assist for lower body dressing: Contact Guard/Touching assist     Toileting Toileting    Toileting assist Assist for toileting: Contact Guard/Touching assist     Transfers Chair/bed transfer  Transfers assist     Chair/bed transfer assist level: Supervision/Verbal cueing     Locomotion Ambulation   Ambulation assist      Assist level: Supervision/Verbal cueing Assistive device: Walker-rolling Max distance: 220'   Walk 10  feet activity   Assist     Assist level: Independent with assistive device Assistive device: Walker-rolling   Walk 50 feet activity   Assist    Assist level: Supervision/Verbal cueing Assistive device: Walker-rolling    Walk 150 feet activity   Assist    Assist level: Supervision/Verbal cueing Assistive device: Walker-rolling    Walk 10 feet on uneven surface  activity   Assist     Assist level: Minimal Assistance - Patient > 75% Assistive device: Walker-rolling   Wheelchair     Assist Is the patient using a wheelchair?: No Type of Wheelchair: Manual    Wheelchair assist level: Dependent - Patient 0% Max wheelchair distance: 150'    Wheelchair 50 feet with 2 turns activity    Assist        Assist Level: Dependent - Patient 0%   Wheelchair 150 feet activity     Assist      Assist Level: Dependent - Patient 0%   Blood pressure (!) 139/58, pulse 70, temperature 98.1 F (36.7 C), temperature source Oral, resp. rate 18, height 5' 3.5 (1.613 m), weight 80.2 kg, SpO2 98%.  Medical Problem List and Plan: 1. 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  Antithrombotics: -DVT/anticoagulation:  Mechanical: Sequential compression devices, below knee Bilateral lower extremities             -antiplatelet therapy: resume ASA 81 mg 7/4 3. Pain Management: No meds ordered- pt denies pain- cannot have tylenol  or NSAIDs due to LFT elevation/autoimmune hepatitis  4. Mood/Behavior/Sleep: LCSW to follow for evaluation and support when available.              -antipsychotic agents: N/A 5. Neuropsych/cognition: This patient is capable of making decisions on her own behalf. 6. Skin/Wound Care: Routine pressure relief measures  7. Fluids/Electrolytes/Nutrition: CMET in  a.m   -Daily weights- Strict I&O              -Renal w/ 1200 fluid restrict              -Severe protien calorie malnutrition: add Glucerna - Alb 1.6- concern that it's due to her hepatitis- will monitor -recheck next Monday             -  Vitamin C             7/4- changed Fluid restriction to 1800cc  7/8- Albumin up to 1.8  -spoke to Renal- they recommended stopping fluid restriction- and stop Lasix - use only as needed-for edema-  since doesn't have CHF- also put on 2G sodium diet per renal-  8. Hypertension: holding home amlodipine   -continue Lasix  40 mg P.O twice daily              - CTM BPs closely  7/8- BP controlled- taking off Lasix  but that rarely affects BP significantly- con't to monitor 9. Hypokalemia: K+ now 3.3 Goal K+ > 4.0 and Mag > 2.0.  -continue potassium 20 meq daily              -will recheck levels in AM  7/4- K+ 3.5 this AM- might need to increase to 40 mEq  7/7- K+ up to 3.8 this AM  7/8- stopped Lasix  so stopped KCl 10. Transaminitis/Elevated Liver Enzyme due to Autoimmune hepatitis/pancreatic cyst/dilated bile duct: Monitor Hepatic panel + CBC w/diff -Repeat LFT's on 7/7 and 7/10 per GI  -Prednisone  40 mg started 07/06/23- Taper by 5 mg/week until done.              TMPT-pending may need to start Thiopurine if normal  -will need ASAP f/u with GI and hepatology after d/c- GI will attempt to work up pancreatic cyst and bile duct dilation while here.  7/9- will recheck LFTs in AM to monitor-  11. Hyperlipidemia: continue holding atorvastatin  due to abnormal LFTs.  12. Glaucoma: continue Dorzolamide -Timolol  and Latanoprost    13. Pre-Diabetes: A1c 6.2. now on prednisone               -monitor BS ac/hs and use SSI for elevated BS due to high doses of Prednisone   7/4- BG dropped to 69 overnight- but will order a night snack to help  7/7- Cbgs 96-166-- con't regimen  7/8- Cbgs 78 to 200- will not be able to start metformin due to Cr, however will add nighttime snack- also  d/w pt so she can ask for it  7/9- got snack last night- BG was 115 this AM as a result- spiked to 203 at 4pm yesterday but was actually low in AM- less than 100 when had blurry vision- will monitor if there's a trend with blurry vision and BG's 14.  Class I obesity: Counsel on diet modifications and exercise to facilitate weight loss.  15. Leukocytosis- just started Prednisone  today- I'm not sure would cause WBC to go to 19k from 10.9k so fast- will recheck in AM and monitor clinically for possible infection  7/4- WBC 19.3- since not still going up, I think more likely from Prednisone - just started after 1 dose of Prednisone -   7/8-7/9 down to 17.1k- will con't to monitor- recheck Thursday- is afebrile 16. Thrombocytopenia- Plts dropped to 107k today from 155k- will recheck in AM- if better, will check Monday/Thursdays- to reduce lab draws  7/4- Plt sup to 157k  7/7- Plts 200k  17. ARF s/p E Coli- with improving renal function  7/4- Cr down to 1.57 and BUN slightly up to 41 (not drinking well)  -encouraged pt to drink more- also will make fluid restriction 1800cc now  7/7- Cr down to 1.42  from 1.57 and BUN 33 form 41  7/8- stopped fluid restriction and  Lasix  40 mg BID per d/w renal- put on 2G sodium diet 18. Lower extremity edema: worse as  per patient, additional Lasix  20mg  administered 7/5, discussed with patient and she feels edema improved 7/6 and does not want additional lasix   7/7- Will add TEDS due to Sx's of lightheadedness- just in case- no documentation of low BP.   7/9- Not wearing TEDS yet- d/w pt, needs to have them put on- due to LE edema which is 1-2+ this AM- likley dfue to stopping Lasix - but don't see that Lasix  dose needed for mild edema- will monitor  I spent a total of  38   minutes on total care today- >50% coordination of care- due to  D/w nursing and pt about edema, lasix  being stopped and explained why- and plan- and also review of BG's as well as timing with blurry  vision.    LOS: 6 days A FACE TO FACE EVALUATION WAS PERFORMED  Beacher Every 07/13/2023, 9:45 AM

## 2023-07-13 NOTE — Progress Notes (Signed)
 Occupational Therapy Session Note  Patient Details  Name: Marissa Orozco MRN: [id number] Date of Birth: 04-11-1945  Today's Date: 07/13/2023 OT Individual Time: 1423-1510 OT Individual Time Calculation (min): 47 min    Short Term Goals: Week 1:  OT Short Term Goal 1 (Week 1): STGs=LTGs due to patient's estimated length of stay.  Skilled Therapeutic Interventions/Progress Updates:      Therapy Documentation Precautions:  Precautions Precautions: Fall Recall of Precautions/Restrictions: Intact Restrictions Weight Bearing Restrictions Per Provider Order: No General: Pt seated in W/C upon OT arrival, agreeable to OT. Husband present   Pain: no pain reported  ADL: OT providing skilled intervention on ADL retraining in order to increase independence with tasks and increase activity tolerance. Pt completed the following tasks at the current level of assist: Bed mobility: Toilet transfer:  Exercises: Pt completed the following exercise circuit in order to improve functional activity, edema and endurance to prepare for ADLs such as bathing. Pt completed the following exercises in standing position with no noted LOB/SOB and 1 min for 3 rounds of each exercise with  rest breaks in between trials -standing marches -knee flexion/extension   Other Treatments: OT educating pt on ways to reduce edema in BLE d/t pt concern.   Pt supine in bed with bed alarm activated, 2 bed rails up, call light within reach and 4Ps assessed.   Therapy/Group: Individual Therapy  Camie Hoe, OTD, OTR/L 07/13/2023, 12:52 PM

## 2023-07-13 NOTE — Telephone Encounter (Signed)
 Left message on machine to call back

## 2023-07-14 DIAGNOSIS — G7281 Critical illness myopathy: Secondary | ICD-10-CM | POA: Diagnosis not present

## 2023-07-14 LAB — CBC WITH DIFFERENTIAL/PLATELET
Abs Immature Granulocytes: 0.11 K/uL — ABNORMAL HIGH (ref 0.00–0.07)
Basophils Absolute: 0 K/uL (ref 0.0–0.1)
Basophils Relative: 0 %
Eosinophils Absolute: 0 K/uL (ref 0.0–0.5)
Eosinophils Relative: 0 %
HCT: 39.8 % (ref 36.0–46.0)
Hemoglobin: 13.9 g/dL (ref 12.0–15.0)
Immature Granulocytes: 1 %
Lymphocytes Relative: 12 %
Lymphs Abs: 1.9 K/uL (ref 0.7–4.0)
MCH: 31.9 pg (ref 26.0–34.0)
MCHC: 34.9 g/dL (ref 30.0–36.0)
MCV: 91.3 fL (ref 80.0–100.0)
Monocytes Absolute: 1.2 K/uL — ABNORMAL HIGH (ref 0.1–1.0)
Monocytes Relative: 7 %
Neutro Abs: 13.4 K/uL — ABNORMAL HIGH (ref 1.7–7.7)
Neutrophils Relative %: 80 %
Platelets: 211 K/uL (ref 150–400)
RBC: 4.36 MIL/uL (ref 3.87–5.11)
RDW: 23.4 % — ABNORMAL HIGH (ref 11.5–15.5)
Smear Review: NORMAL
WBC: 16.7 K/uL — ABNORMAL HIGH (ref 4.0–10.5)
nRBC: 0 % (ref 0.0–0.2)

## 2023-07-14 LAB — GLUCOSE, CAPILLARY
Glucose-Capillary: 84 mg/dL (ref 70–99)
Glucose-Capillary: 92 mg/dL (ref 70–99)
Glucose-Capillary: 205 mg/dL — ABNORMAL HIGH (ref 70–99)
Glucose-Capillary: 116 mg/dL — ABNORMAL HIGH (ref 70–99)

## 2023-07-14 LAB — BASIC METABOLIC PANEL WITH GFR
Anion gap: 14 (ref 5–15)
BUN: 23 mg/dL (ref 8–23)
CO2: 14 mmol/L — ABNORMAL LOW (ref 22–32)
Calcium: 8.2 mg/dL — ABNORMAL LOW (ref 8.9–10.3)
Chloride: 109 mmol/L (ref 98–111)
Creatinine, Ser: 0.96 mg/dL (ref 0.44–1.00)
GFR, Estimated: >60 mL/min (ref >60)
Glucose, Bld: 84 mg/dL (ref 70–99)
Potassium: 4.2 mmol/L (ref 3.5–5.1)
Sodium: 137 mmol/L (ref 135–145)

## 2023-07-14 LAB — HEPATIC FUNCTION PANEL
ALT: 192 U/L — ABNORMAL HIGH (ref 0–44)
AST: 203 U/L — ABNORMAL HIGH (ref 15–41)
Albumin: 1.7 g/dL — ABNORMAL LOW (ref 3.5–5.0)
Alkaline Phosphatase: 177 U/L — ABNORMAL HIGH (ref 38–126)
Bilirubin, Direct: 1.1 mg/dL — ABNORMAL HIGH (ref 0.0–0.2)
Indirect Bilirubin: 1.5 mg/dL — ABNORMAL HIGH (ref 0.3–0.9)
Total Bilirubin: 2.6 mg/dL — ABNORMAL HIGH (ref 0.0–1.2)
Total Protein: 5 g/dL — ABNORMAL LOW (ref 6.5–8.1)

## 2023-07-14 MED ORDER — FUROSEMIDE 40 MG PO TABS
40.0000 mg | ORAL_TABLET | Freq: Once | ORAL | Status: AC
  Administered 2023-07-14: 40 mg via ORAL
  Filled 2023-07-14: qty 1

## 2023-07-14 NOTE — Telephone Encounter (Signed)
 Left message on machine to call back

## 2023-07-14 NOTE — Progress Notes (Signed)
 Occupational Therapy Session Note  Patient Details  Name: Marissa Orozco MRN: [id number] Date of Birth: July 01, 1945  Today's Date: 07/14/2023 OT Individual Time: 9185-9070 OT Individual Time Calculation (min): 75 min    Short Term Goals: Week 1:  OT Short Term Goal 1 (Week 1): STGs=LTGs due to patient's estimated length of stay.  Skilled Therapeutic Interventions/Progress Updates:  Pt greeted seated in w/c, pt agreeable to OT intervention.      Transfers/bed mobility/functional mobility:  Pt completed functional ambulation with rollator with close supervision.   Therapeutic activity:  Pt completed functional endurance task with pt instructed to step up on aerobic step with unilateral support to then reach dynamically to match squigz to mirror. Pt completed task with CGA and no LOB. Pt completed task for 2 trials.    ADLs:  Grooming: pt donned deo with set- up assist.  UB dressing:pt donned OH shirt with set- up assist.  LB dressing: pt donned pants with set- up assist from TTB Footwear: pt donned shoes with set- up assist.    Bathing: pt completed bathing seated on TTB with supervision.  Transfers: ADL transfers with rollator with supervision    IADLS:pt completed functional mobility in kithcen with rollator with a focus on dynamic balance with pt reaching OH and below knee level to retrieve items in kithchen. Pt completed task with supervision but no LOB. Pt needed MIN verbal cues to manage rollator appropriately/safely. Education provided on energy conservation strategies for kitchen tasks.     Exercises:  Pt completed interval workout to increase functional endurance with pt completing below exercises with 3 lb weighted ball:  1 min of sit>stands holding weighted ball 30 sec rest break 1 min of standing OH presses 30 sec rest break 1 min of diagonal wood chops to R and L side 30 sec rest break                 Ended session with pt seated in w/c with all needs within  reach.   Therapy Documentation Precautions:  Precautions Precautions: Fall Recall of Precautions/Restrictions: Intact Restrictions Weight Bearing Restrictions Per Provider Order: No  Pain: No pain    Therapy/Group: Individual Therapy  Ronal Gift [facility] 07/14/2023, 10:48 AM

## 2023-07-14 NOTE — Progress Notes (Signed)
 Occupational Therapy Session Note  Patient Details  Name: Marissa Orozco MRN: [id number] Date of Birth: 03-07-1945  Today's Date: 07/14/2023 OT Individual Time: 1300-1400 OT Individual Time Calculation (min): 60 min    Short Term Goals: Week 1:  OT Short Term Goal 1 (Week 1): STGs=LTGs due to patient's estimated length of stay.  Skilled Therapeutic Interventions/Progress Updates:      Therapy Documentation Precautions:  Precautions Precautions: Fall Recall of Precautions/Restrictions: Intact Restrictions Weight Bearing Restrictions Per Provider Order: No General: Pt seated in W/C upon OT arrival, agreeable to OT.  Pain: no pain reported  ADL: OT issuing elbow protector for ulnar nerve d/t numbness/tingling in Lt ring and pinky fingers. OT also assisting with donning thigh high TED hose in order to assist with decreasing edema in BLE. Pt able to functionally ambulate with rollator at distant supervision from room><therapy gym without breaks and no LOB.   Exercises: Pt completed the following exercise circuit in order to improve functional activity, strength and endurance to prepare for ADLs such as bathing. Pt completed the following exercises in seated/standing position with no noted LOB/SOB and various repetitions on each exercise with 6.6# medicine ball: -bicep curls 3x10 -shoulder flexion 3x10 -diagonal shoulder raises 3x10 with 4.4 # ball -modified squats 6, 8, then 10 reps -leg extensions 3x10 with 7.5# ankle weight -seated marches with 7.5# ankle weight    Other Treatments: OT discussing online/virtual yoga or tai chi to do in home to keep active once D/C.   Pt supine in bed with bed alarm activated, 2 bed rails up, call light within reach and 4Ps assessed.    Therapy/Group: Individual Therapy  Camie Hoe, OTD, OTR/L 07/14/2023, 2:27 PM

## 2023-07-14 NOTE — Progress Notes (Signed)
 PROGRESS NOTE   Subjective/Complaints:   Pt reports when woke up, L lateral 4th finger and 5th digit numb and tingling.   Also having more swelling in LE's.  Did have hard BM, but improved with prune juice.   Doesn't want any stool softeners.    ROS:   Pt denies SOB, abd pain, CP, N/V/C/D, and vision changes   Per HPI   Objective:   No results found. No results for input(s): WBC, HGB, HCT, PLT in the last 72 hours.   Recent Labs    07/14/23 0614  NA 137  K 4.2  CL 109  CO2 14*  GLUCOSE 84  BUN 23  CREATININE 0.96  CALCIUM  8.2*    Intake/Output Summary (Last 24 hours) at 07/14/2023 1026 Last data filed at 07/14/2023 0700 Gross per 24 hour  Intake 830 ml  Output --  Net 830 ml        Physical Exam: Vital Signs Blood pressure 122/62, pulse 60, temperature 98.3 F (36.8 C), resp. rate 18, height 5' 3.5 (1.613 m), weight 80.3 kg, SpO2 100%.        General: awake, alert, appropriate, sitting up slightly in bed; RN in room;  NAD HENT: conjugate gaze; oropharynx moist CV: regular rate; no JVD Pulmonary: CTA B/L; no W/R/R- good air movement GI: soft, NT, ND, (+)BS Psychiatric: appropriate Neurological: Ox3 LUE- numb in lateral 4th digit and 5th digits-  Extremities: 2+  LE edema to ankles B/L Skin- more shiny-  to swollen feet/ankles- appears like mole/skin lesions scraped and had been bleeding- is not now- looks better MSK: 7/7- B/L LE's - HF 3/5 B/L- otherwise 4+/5 in LE's.  Musculoskeletal:     Cervical back: Normal range of motion and neck supple.     Comments: Ue's- Deltoids 4-/5 otherwise 5-/5 throughout in Ue's LE's- RLE- HF 2+/5; 4+/5 otherwise LLE- HF 2-/5 KE/KF/DF/PF all 4+/5 otherwise, stable 7/6   Skin:    General: Skin is warm and dry.     Capillary Refill: Capillary refill takes less than 2 seconds.     Coloration: Skin is pale.     Comments: No skin breakdown seen   Neurological:     Mental Status: She is alert.     Sensory: No sensory deficit.     Comments: Intact to light touch in all 4 extremities Voice OK, but sounds somewhat weak Ox3 and appropriate- but slightly delayed responses Assessment/Plan: 1. Functional deficits which require 3+ hours per day of interdisciplinary therapy in a comprehensive inpatient rehab setting. Physiatrist is providing close team supervision and 24 hour management of active medical problems listed below. Physiatrist and rehab team continue to assess barriers to discharge/monitor patient progress toward functional and medical goals  Care Tool:  Bathing    Body parts bathed by patient: Right arm, Left arm, Chest, Abdomen, Front perineal area, Buttocks, Right upper leg, Left upper leg, Right lower leg, Left lower leg, Face         Bathing assist Assist Level: Contact Guard/Touching assist     Upper Body Dressing/Undressing Upper body dressing   What is the patient wearing?: Pull over shirt, Bra    Upper  body assist Assist Level: Set up assist    Lower Body Dressing/Undressing Lower body dressing      What is the patient wearing?: Underwear/pull up, Pants     Lower body assist Assist for lower body dressing: Contact Guard/Touching assist     Toileting Toileting    Toileting assist Assist for toileting: Contact Guard/Touching assist     Transfers Chair/bed transfer  Transfers assist     Chair/bed transfer assist level: Supervision/Verbal cueing     Locomotion Ambulation   Ambulation assist      Assist level: Supervision/Verbal cueing Assistive device: Walker-rolling Max distance: >1000'   Walk 10 feet activity   Assist     Assist level: Independent with assistive device Assistive device: Walker-rolling   Walk 50 feet activity   Assist    Assist level: Independent with assistive device Assistive device: Walker-rolling    Walk 150 feet activity   Assist    Assist  level: Supervision/Verbal cueing Assistive device: Walker-rolling    Walk 10 feet on uneven surface  activity   Assist     Assist level: Contact Guard/Touching assist Assistive device: Walker-rolling   Wheelchair     Assist Is the patient using a wheelchair?: No Type of Wheelchair: Manual    Wheelchair assist level: Dependent - Patient 0% Max wheelchair distance: 150'    Wheelchair 50 feet with 2 turns activity    Assist        Assist Level: Dependent - Patient 0%   Wheelchair 150 feet activity     Assist      Assist Level: Dependent - Patient 0%   Blood pressure 122/62, pulse 60, temperature 98.3 F (36.8 C), resp. rate 18, height 5' 3.5 (1.613 m), weight 80.3 kg, SpO2 100%.  Medical Problem List and Plan: 1. 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  2.  Antithrombotics: -DVT/anticoagulation:  Mechanical: Sequential compression devices, below knee Bilateral lower extremities             -antiplatelet therapy: resume ASA 81 mg 7/4 3. Pain Management: No meds ordered- pt denies pain- cannot have tylenol  or NSAIDs due to LFT elevation/autoimmune hepatitis  4. Mood/Behavior/Sleep: LCSW to follow for evaluation and support when available.              -antipsychotic agents: N/A 5. Neuropsych/cognition: This patient is capable of making decisions on her own behalf. 6. Skin/Wound Care: Routine pressure relief measures  7. Fluids/Electrolytes/Nutrition: CMET in a.m   -Daily weights- Strict I&O              -Renal w/ 1200 fluid restrict              -Severe protien calorie  malnutrition: add Glucerna - Alb 1.6- concern that it's due to her hepatitis- will monitor -recheck next Monday             -Vitamin C   7/4- changed Fluid restriction to 1800cc  7/8- Albumin up to 1.8  -spoke to Renal- they recommended stopping fluid restriction- and stop Lasix - use only as needed-for edema-  since doesn't have CHF- also put on 2G sodium diet per renal-  8. Hypertension: holding home amlodipine   -continue Lasix  40 mg P.O twice daily              - CTM BPs closely  7/8- BP controlled- taking off Lasix  but that rarely affects BP significantly- con't to monitor 9. Hypokalemia: K+ now 3.3 Goal K+ > 4.0 and Mag > 2.0.  -continue potassium 20 meq daily              -will recheck levels in AM  7/4- K+ 3.5 this AM- might need to increase to 40 mEq  7/7- K+ up to 3.8 this AM  7/8- stopped Lasix  so stopped Kcl  7/10- K+ up to 4.2- doing better 10. Transaminitis/Elevated Liver Enzyme due to Autoimmune hepatitis/pancreatic cyst/dilated bile duct: Monitor Hepatic panel + CBC w/diff -Repeat LFT's on 7/7 and 7/10 per GI  -Prednisone  40 mg started 07/06/23- Taper by 5 mg/week until done.              TMPT-pending may need to start Thiopurine if normal  -will need ASAP f/u with GI and hepatology after d/c- GI will attempt to work up pancreatic cyst and bile duct dilation while here.  7/9- will recheck LFTs in AM to monitor-  7/10- LFTs down to 203/192 so still improving 11. Hyperlipidemia: continue holding atorvastatin  due to abnormal LFTs.  12. Glaucoma: continue Dorzolamide -Timolol  and Latanoprost    13. Pre-Diabetes: A1c 6.2. now on prednisone               -monitor BS ac/hs and use SSI for elevated BS due to high doses of Prednisone   7/4- BG dropped to 69 overnight- but will order a night snack to help  7/7- Cbgs 96-166-- con't regimen  7/8- Cbgs 78 to 200- will not be able to start metformin due to Cr, however will add nighttime snack- also d/w pt so she can ask for  it  7/9- got snack last night- BG was 115 this AM as a result- spiked to 203 at 4pm yesterday but was actually low in AM- less than 100 when had blurry vision- will monitor if there's a trend with blurry vision and BG's  7/10- CBGs 76 to 188 in last 24 hours- getting night snacks- since titrating down on Prednisone , hopefully will improve-  14.  Class I obesity: Counsel on diet modifications and exercise to facilitate weight loss.  15. Leukocytosis- just started Prednisone  today- I'm not sure would cause WBC to go to 19k from 10.9k so fast- will recheck in AM and monitor clinically for possible infection  7/4- WBC 19.3- since not still going up, I think more likely from Prednisone - just started after 1 dose of Prednisone -   7/8-7/9 down to 17.1k- will con't to monitor- recheck Thursday- is afebrile  7/10- labs pending- called nurse to look for the labs for me-  16. Thrombocytopenia- Plts dropped to 107k today from 155k- will recheck in AM- if better, will check Monday/Thursdays- to reduce lab draws  7/4- Plt sup to 157k  7/7- Plts 200k  17. ARF s/p E Coli- with improving renal function  7/4- Cr down to 1.57 and BUN slightly up to 41 (not drinking well)  -encouraged pt to drink more- also will make fluid restriction 1800cc now  7/7- Cr down to 1.42  from 1.57 and BUN 33 form 41  7/8- stopped fluid restriction and  Lasix  40 mg BID per d/w renal- put on 2G sodium diet  7/10- Cr down to normal- 0.96 and BUN 23- doing SO much better 18. Lower extremity edema: worse as per patient, additional Lasix  20mg  administered 7/5, discussed with patient and she feels edema improved 7/6 and does not want additional lasix   7/7- Will add TEDS due to Sx's of lightheadedness- just in case- no documentation of low BP.   7/9- Not wearing TEDS yet- d/w pt, needs to have them put on- due to LE edema which is 1-2+ this AM- likley dfue to stopping Lasix - but don't see that Lasix  dose needed for mild edema- will  monitor  7/10- Wearing TEDs daily- will give 1 dose Lasix  40 mg x1 today- wait on more KCL since K+ 4.2- and monitor- since ankles 2+ edema 19. LUE ulnar neuropathy Sx's  7/10- likley due ot compression overnight or with w/c- will ask therapy to get elbow protector for pt.     I spent a total of 39   minutes on total care today- >50% coordination of care- due to  D/w pt at length about ulnar neuropathy Sx's- as well as review of labs, and CBGs and vitals. Made meds changes for edema   LOS: 7 days A FACE TO FACE EVALUATION WAS PERFORMED  Nevaya Nagele 07/14/2023, 10:26 AM

## 2023-07-14 NOTE — Progress Notes (Signed)
 Physical Therapy Session Note  Patient Details  Name: OSCEOLA HOLIAN MRN: [id number] Date of Birth: 03-10-1945  Today's Date: 07/14/2023 PT Individual Time: 1045-1200 PT Individual Time Calculation (min): 75 min   Short Term Goals: Week 1:  PT Short Term Goal 1 (Week 1): STG = LTG due to ELOS  Skilled Therapeutic Interventions/Progress Updates:    Pt in w/c when PT arrived, agreeable to participate with PT.  Pt denies pain, c/o LE edema. Pt requests to use restroom.  Pt demonstrates indep with toileting, transfers.  Pt denies pain, continues to c/o bilateral LE edema. Note new small ulcer to right ant lower leg, covered with bandaid. In supine PT performed effleurage massage to RLE elevated on wedge for edema control.  Therex: ankle pumps (elevated LE's) 5x10, heel slides 2x15, hip abd/add 2x15 each, bridging 2x15. Pt requesting to use restroom again, mod indep for activity. Pt in w/c at end of session with all items within reach, LE's elevated.   Therapy Documentation Precautions:  Precautions Precautions: Fall Recall of Precautions/Restrictions: Intact Restrictions Weight Bearing Restrictions Per Provider Order: No    Pain: Pt denies pain      Therapy/Group: Individual Therapy  Arland GORMAN Fast 07/14/2023, 12:53 PM

## 2023-07-15 DIAGNOSIS — G7281 Critical illness myopathy: Secondary | ICD-10-CM | POA: Diagnosis not present

## 2023-07-15 LAB — CBC WITH DIFFERENTIAL/PLATELET
Abs Immature Granulocytes: 0 K/uL (ref 0.00–0.07)
Basophils Absolute: 0.2 K/uL — ABNORMAL HIGH (ref 0.0–0.1)
Basophils Relative: 1 %
Eosinophils Absolute: 0 K/uL (ref 0.0–0.5)
Eosinophils Relative: 0 %
HCT: 33.4 % — ABNORMAL LOW (ref 36.0–46.0)
Hemoglobin: 11.6 g/dL — ABNORMAL LOW (ref 12.0–15.0)
Lymphocytes Relative: 6 %
Lymphs Abs: 1 K/uL (ref 0.7–4.0)
MCH: 31.9 pg (ref 26.0–34.0)
MCHC: 34.7 g/dL (ref 30.0–36.0)
MCV: 91.8 fL (ref 80.0–100.0)
Monocytes Absolute: 1 K/uL (ref 0.1–1.0)
Monocytes Relative: 6 %
Neutro Abs: 13.9 K/uL — ABNORMAL HIGH (ref 1.7–7.7)
Neutrophils Relative %: 87 %
Platelets: 170 K/uL (ref 150–400)
RBC: 3.64 MIL/uL — ABNORMAL LOW (ref 3.87–5.11)
RDW: 23.7 % — ABNORMAL HIGH (ref 11.5–15.5)
WBC: 16 K/uL — ABNORMAL HIGH (ref 4.0–10.5)
nRBC: 0 % (ref 0.0–0.2)
nRBC: 0 /100{WBCs}

## 2023-07-15 LAB — GLUCOSE, CAPILLARY
Glucose-Capillary: 77 mg/dL (ref 70–99)
Glucose-Capillary: 87 mg/dL (ref 70–99)
Glucose-Capillary: 154 mg/dL — ABNORMAL HIGH (ref 70–99)
Glucose-Capillary: 168 mg/dL — ABNORMAL HIGH (ref 70–99)

## 2023-07-15 LAB — BASIC METABOLIC PANEL WITH GFR
Anion gap: 8 (ref 5–15)
BUN: 21 mg/dL (ref 8–23)
CO2: 20 mmol/L — ABNORMAL LOW (ref 22–32)
Calcium: 8 mg/dL — ABNORMAL LOW (ref 8.9–10.3)
Chloride: 109 mmol/L (ref 98–111)
Creatinine, Ser: 0.91 mg/dL (ref 0.44–1.00)
GFR, Estimated: >60 mL/min (ref >60)
Glucose, Bld: 82 mg/dL (ref 70–99)
Potassium: 3.5 mmol/L (ref 3.5–5.1)
Sodium: 137 mmol/L (ref 135–145)

## 2023-07-15 MED ORDER — METFORMIN HCL 500 MG PO TABS
500.0000 mg | ORAL_TABLET | Freq: Two times a day (BID) | ORAL | Status: DC
  Administered 2023-07-15 – 2023-07-18 (×6): 500 mg via ORAL
  Filled 2023-07-15 (×6): qty 1

## 2023-07-15 MED ORDER — POTASSIUM CHLORIDE CRYS ER 20 MEQ PO TBCR: 40.0000 meq | EXTENDED_RELEASE_TABLET | Freq: Every day | ORAL | Status: DC | PRN

## 2023-07-15 MED ORDER — PROSOURCE PLUS PO LIQD
30.0000 mL | Freq: Two times a day (BID) | ORAL | Status: DC
  Administered 2023-07-15 – 2023-07-17 (×4): 30 mL via ORAL
  Filled 2023-07-15 (×3): qty 30

## 2023-07-15 MED ORDER — PANTOPRAZOLE SODIUM 40 MG PO TBEC
40.0000 mg | DELAYED_RELEASE_TABLET | Freq: Every day | ORAL | Status: DC
  Administered 2023-07-15 – 2023-07-18 (×4): 40 mg via ORAL
  Filled 2023-07-15 (×4): qty 1

## 2023-07-15 MED ORDER — FUROSEMIDE 40 MG PO TABS: 40.0000 mg | ORAL_TABLET | Freq: Every day | ORAL | Status: DC | PRN

## 2023-07-15 NOTE — Discharge Instructions (Signed)
 Inpatient Rehab Discharge Instructions  Marissa Orozco Discharge date and time: 07/18/23 10:11 AM   Activities/Precautions/ Functional Status: Activity: activity as tolerated and no lifting, driving, or strenuous exercise for until released by provider  Diet: cardiac diet and low fat, low cholesterol diet Wound Care: none needed Functional status:  ___ No restrictions     ___ Walk up steps independently ___ 24/7 supervision/assistance   ___ Walk up steps with assistance ___ Intermittent supervision/assistance  ___ Bathe/dress independently ___ Walk with walker     ___ Bathe/dress with assistance ___ Walk Independently    ___ Shower independently __X_ Walk with assistance    ___ Shower with assistance __X_ No alcohol     ___ Return to work/school ________  Special Instructions:  Follow dosages for Prednisone  as indicated on medication bottle.   My questions have been answered and I understand these instructions. I will adhere to these goals and the provided educational materials after my discharge from the hospital.  Patient/Caregiver Signature _______________________________ Date __________  Clinician Signature _______________________________________ Date __________  Please bring this form and your medication list with you to all your follow-up doctor's appointments.       COMMUNITY REFERRALS UPON DISCHARGE:    Home Health:   PT   OT     RN                Agency:[facility] [phone]    Medical Equipment/Items Ordered:ROLLATOR                                                 Agency/Supplier:[facility]  [phone]

## 2023-07-15 NOTE — Progress Notes (Addendum)
 PROGRESS NOTE   Subjective/Complaints:  Pt reports LBM 2 days ago- doesn't feel constipated- this is normal for her.   Pt reports LE edema isn't much better with Lasix .  Only [employer] works-doesn't make her N/V- will ask hubby to bring in.  Glucerna and Boost, etc, makes her vomit.   Asking about plan for CBGs when she goes home.   ROS:   Pt denies SOB, abd pain, CP, N/V/C/D, and vision changes   Per HPI   Objective:   No results found. Recent Labs    07/14/23 1810 07/15/23 0558  WBC 16.7* 16.0*  HGB 13.9 11.6*  HCT 39.8 33.4*  PLT 211 170     Recent Labs    07/14/23 0614 07/15/23 0558  NA 137 137  K 4.2 3.5  CL 109 109  CO2 14* 20*  GLUCOSE 84 82  BUN 23 21  CREATININE 0.96 0.91  CALCIUM  8.2* 8.0*    Intake/Output Summary (Last 24 hours) at 07/15/2023 1315 Last data filed at 07/15/2023 0700 Gross per 24 hour  Intake 237 ml  Output --  Net 237 ml        Physical Exam: Vital Signs Blood pressure 137/63, pulse 69, temperature 99 F (37.2 C), temperature source Oral, resp. rate 18, height 5' 3.5 (1.613 m), weight 80.1 kg, SpO2 100%.         General: awake, alert, appropriate, supine in bed; NAD HENT: conjugate gaze; oropharynx moist CV: regular rate and rhythm; no JVD Pulmonary: CTA B/L; no W/R/R- good air movement GI: soft, NT, ND, (+)BS Psychiatric: appropriate, quiet Neurological: Ox3 Extremities; 2+ LE edema to mid calves B/L LUE- numb in lateral 4th digit and 5th digits-  Extremities: 2+  LE edema to ankles B/L Skin- more shiny-  to swollen feet/ankles- appears like mole/skin lesions scraped and had been bleeding- is not now- looks better MSK: 7/7- B/L LE's - HF 3/5 B/L- otherwise 4+/5 in LE's.  Musculoskeletal:     Cervical back: Normal range of motion and neck supple.     Comments: Ue's- Deltoids 4-/5 otherwise 5-/5 throughout in Ue's LE's- RLE- HF 2+/5; 4+/5  otherwise LLE- HF 2-/5 KE/KF/DF/PF all 4+/5 otherwise, stable 7/6   Skin:    General: Skin is warm and dry.     Capillary Refill: Capillary refill takes less than 2 seconds.     Coloration: Skin is pale.     Comments: No skin breakdown seen  Neurological:     Mental Status: She is alert.     Sensory: No sensory deficit.     Comments: Intact to light touch in all 4 extremities Voice OK, but sounds somewhat weak Ox3 and appropriate- but slightly delayed responses Assessment/Plan: 1. Functional deficits which require 3+ hours per day of interdisciplinary therapy in a comprehensive inpatient rehab setting. Physiatrist is providing close team supervision and 24 hour management of active medical problems listed below. Physiatrist and rehab team continue to assess barriers to discharge/monitor patient progress toward functional and medical goals  Care Tool:  Bathing    Body parts bathed by patient: Right arm, Left arm, Chest, Abdomen, Front perineal area, Buttocks, Right upper leg, Left upper  leg, Right lower leg, Left lower leg, Face         Bathing assist Assist Level: Supervision/Verbal cueing     Upper Body Dressing/Undressing Upper body dressing   What is the patient wearing?: Pull over shirt    Upper body assist Assist Level: Set up assist    Lower Body Dressing/Undressing Lower body dressing      What is the patient wearing?: Underwear/pull up, Pants     Lower body assist Assist for lower body dressing: Supervision/Verbal cueing     Toileting Toileting    Toileting assist Assist for toileting: Contact Guard/Touching assist     Transfers Chair/bed transfer  Transfers assist     Chair/bed transfer assist level: Supervision/Verbal cueing     Locomotion Ambulation   Ambulation assist      Assist level: Supervision/Verbal cueing Assistive device: Walker-rolling Max distance: 400'   Walk 10 feet activity   Assist     Assist level: Independent  with assistive device Assistive device: Walker-rolling   Walk 50 feet activity   Assist    Assist level: Independent with assistive device Assistive device: Walker-rolling    Walk 150 feet activity   Assist    Assist level: Supervision/Verbal cueing Assistive device: Walker-rolling    Walk 10 feet on uneven surface  activity   Assist     Assist level: Contact Guard/Touching assist Assistive device: Walker-rolling   Wheelchair     Assist Is the patient using a wheelchair?: No Type of Wheelchair: Manual    Wheelchair assist level: Dependent - Patient 0% Max wheelchair distance: 150'    Wheelchair 50 feet with 2 turns activity    Assist        Assist Level: Dependent - Patient 0%   Wheelchair 150 feet activity     Assist      Assist Level: Dependent - Patient 0%   Blood pressure 137/63, pulse 69, temperature 99 F (37.2 C), temperature source Oral, resp. rate 18, height 5' 3.5 (1.613 m), weight 80.1 kg, SpO2 100%.  Medical Problem List and Plan: 1. 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  Antithrombotics: -DVT/anticoagulation:  Mechanical: Sequential compression devices, below knee Bilateral lower extremities             -antiplatelet therapy: resume ASA 81 mg 7/4 3. Pain Management: No meds ordered- pt denies pain- cannot have tylenol  or NSAIDs due to LFT elevation/autoimmune hepatitis  4. Mood/Behavior/Sleep: LCSW to follow for evaluation and support when available.              -antipsychotic agents: N/A 5. Neuropsych/cognition: This patient is capable of making decisions on her own behalf. 6. Skin/Wound Care: Routine pressure relief measures  7.  Fluids/Electrolytes/Nutrition: CMET in a.m   -Daily weights- Strict I&O              -Renal w/ 1200 fluid restrict              -Severe protien calorie malnutrition: add Glucerna - Alb 1.6- concern that it's due to  her hepatitis- will monitor -recheck next Monday             -Vitamin C             7/4- changed Fluid restriction to 1800cc  7/8- Albumin up to 1.8  -spoke to Renal- they recommended stopping fluid restriction- and stop Lasix - use only as needed-for edema-  since doesn't have CHF- also put on 2G sodium diet per renal-   7/11- Alb 1.7- likely cause of LE edema- which is worse since stopped Lasix , but Lasix  can affect renal function, so don't want to keep her on it- also husband to bring in [employer] supplements to help with low albumin 8. Hypertension: holding home amlodipine   -continue Lasix  40 mg P.O twice daily              - CTM BPs closely  7/8- BP controlled- taking off Lasix  but that rarely affects BP significantly- con't to monitor 9. Hypokalemia: K+ now 3.3 Goal K+ > 4.0 and Mag > 2.0.  -continue potassium 20 meq daily              -will recheck levels in AM  7/4- K+ 3.5 this AM- might need to increase to 40 mEq  7/7- K+ up to 3.8 this AM  7/8- stopped Lasix  so stopped Kcl  7/10- K+ up to 4.2- doing better  7/11- K+- 3.5- due to Lasix  yesterday-will give Kcl 40 meq daily prn if gives Lasix  10. Transaminitis/Elevated Liver Enzyme due to Autoimmune hepatitis/pancreatic cyst/dilated bile duct: Monitor Hepatic panel + CBC w/diff -Repeat LFT's on 7/7 and 7/10 per GI  -Prednisone  40 mg started 07/06/23- Taper by 5 mg/week until done.              TMPT-pending may need to start Thiopurine if normal  -will need ASAP f/u with GI and hepatology after d/c- GI will attempt to work up pancreatic cyst and bile duct dilation while here.  7/9- will recheck LFTs in AM to monitor-  7/10- LFTs down to 203/192 so still improving 11. Hyperlipidemia: continue holding atorvastatin  due to  abnormal LFTs.  12. Glaucoma: continue Dorzolamide -Timolol  and Latanoprost    13. Pre-Diabetes: A1c 6.2. now on prednisone               -monitor BS ac/hs and use SSI for elevated BS due to high doses of Prednisone   7/4- BG dropped to 69 overnight- but will order a night snack to help  7/7- Cbgs 96-166-- con't regimen  7/8- Cbgs 78 to 200- will not be able to start metformin  due to Cr, however will add nighttime snack- also d/w pt so she can ask for it  7/9- got snack last night- BG was 115 this AM as a result- spiked to 203 at 4pm yesterday but was actually low in AM- less than 100 when had blurry vision- will monitor if there's a trend with blurry vision and BG's  7/10- CBGs 76 to 188 in last 24 hours- getting night snacks- since titrating down on Prednisone , hopefully will improve-   7/11- Cbgs still elevated- will try Metformin  500 mg BID for hyperglycemia- and stop when pt's Prednisone  gets to 20 mg- her BG's should be well enough controlled by then 14.  Class I obesity: Counsel on diet modifications and exercise to facilitate weight loss.  15. Leukocytosis- just started Prednisone  today- I'm not sure would cause WBC to go to 19k from 10.9k so fast- will recheck in AM and monitor clinically for possible  infection  7/4- WBC 19.3- since not still going up, I think more likely from Prednisone - just started after 1 dose of Prednisone -   7/8-7/9 down to 17.1k- will con't to monitor- recheck Thursday- is afebrile  7/10- labs pending- called nurse to look for the labs for me- 7/11- WBC down to 16k- so improving with reduction in Prednisone - so unlikely due to illness  16. Thrombocytopenia- Plts dropped to 107k today from 155k- will recheck in AM- if better, will check Monday/Thursdays- to reduce lab draws  7/4- Plt sup to 157k  7/7- Plts 200k  7/11- Pl;ts 170k  17. ARF s/p E Coli- with improving renal function  7/4- Cr down to 1.57 and BUN slightly up to 41 (not drinking well)  -encouraged pt to  drink more- also will make fluid restriction 1800cc now  7/7- Cr down to 1.42  from 1.57 and BUN 33 form 41  7/8- stopped fluid restriction and  Lasix  40 mg BID per d/w renal- put on 2G sodium diet  7/10- Cr down to normal- 0.96 and BUN 23- doing SO much better  7/11- K+ 3.5 and BUN 21 sand Cr 0.91- doing better 18. Lower extremity edema: worse as per patient, additional Lasix  20mg  administered 7/5, discussed with patient and she feels edema improved 7/6 and does not want additional lasix   7/7- Will add TEDS due to Sx's of lightheadedness- just in case- no documentation of low BP.   7/9- Not wearing TEDS yet- d/w pt, needs to have them put on- due to LE edema which is 1-2+ this AM- likley dfue to stopping Lasix - but don't see that Lasix  dose needed for mild edema- will monitor  7/10- Wearing TEDs daily- will give 1 dose Lasix  40 mg x1 today- wait on more KCL since K+ 4.2- and monitor- since ankles 2+ edema  7/11- didn't help much- likely due to Alb of 1.7- which is causing fluid to stay in tissues, not in vessels- so Lasix  not as effective- will try Prosource to help raise her albumin- since has N/V with Glucerna and other supplements- will also have husband bring in [employer] which works for her.  19. LUE ulnar neuropathy Sx's  7/10- likley due ot compression overnight or with w/c- will ask therapy to get elbow protector for pt.   7/11- no numbness/tingling this AM 20. Anemia  7/11- Pt's has drop in Hb to 11.6- which is unusual- having no bleeding that she's aware of anywhere including stools, but LBM 2 days ago- verified Protonix - and saw wasn't on- so started immediately.    I spent a total of  53  minutes on total care today- >50% coordination of care- due to  D/w pt about: BG's once she goes home; Nutrition and how it's related to her hepatitis most likely- but will try Prosource since cannot tolerate Glucerna- causes her to have N/V. Also we discussed her LE edema at length and how related to  malnutrition.   LOS: 8 days A FACE TO FACE EVALUATION WAS PERFORMED  Tanasia Budzinski 07/15/2023, 1:15 PM

## 2023-07-15 NOTE — Progress Notes (Signed)
 Patient ID: Marissa Orozco, female   DOB: [birth year], 78 y.o.   MRN: [id number]  Have ordered rollator via [employer] for pt to be delivered to room prior to discharge.

## 2023-07-15 NOTE — Progress Notes (Addendum)
 Physical Therapy Session Note  Patient Details  Name: Marissa Orozco MRN: [id number] Date of Birth: August 27, 1945  Today's Date: 07/15/2023 PT Individual Time: 9054-8954; 1400-1500 PT Individual Time Calculation (min): 60 min   Short Term Goals: Week 1:  PT Short Term Goal 1 (Week 1): STG = LTG due to ELOS  Skilled Therapeutic Interventions/Progress Updates:    Session 1: Pt up in w/c when PT arrived. Pt denies pain.  Pt agreeable for PT.  Session focusing on endurance, progressing to LRAD as tolerated. Pt ambulated from room to [place] corridor interchanging rollator vs no device.  Pt able to amb x~60-80 ft without device before feeling SOB and needing rollator for support. Pt performed side stepping in hallway with railing for support x6 reps, retro walking x1 HHA on railing x6 reps; resting between sets. From [place] corridor to main gym using rollator, pt required rest due to fatigue. Pt performed sit to stand series with different mat height levels to improve balance, transfer, overall strengthening x10, x7, x5, x12 reps. Pt needed to rest due to fatigue. 6 step ups x1', x2' with x1 HHA, with rest between sets. Pt amb back to room with rollator ~x200'. Pt needing to use restroom using rollator mod indep.  Pt ended session up in w/c with BLE elevated.   Session 2:  Pt up in w/c with husband present. Husband inquiring about pt's discharge information. Pt agreeable for PT.  Pt amb to main gym x200', 2# ankle wt donned on LLE for NMR/strengthening, to increase clearance during swing phase gait, pt performed stair negotiation with x2 railing; 4x4 steps up/down mod indep.  5TSTS: 12. Initiated gait tx with SPC.  Pt able to demonstrate good sequencing and stability amb from main gym, around day room, back to room ~x500' without rest and supervision. Pt requested to use restroom once in room using SPC safely with mod indep.  Pt able to pick up paper off floor with x1 hand on sink without LOB. Pt returned to  w/c with BLE elevated at end of session with items within reach.  PT notified SW to reach to family regarding discharge information. PT discussed ex's, HHPT continuation, DME recommendation with husband.   Therapy Documentation Precautions:  Precautions Precautions: Fall Recall of Precautions/Restrictions: Intact Restrictions Weight Bearing Restrictions Per Provider Order: No  Pain: pt denies pain       Therapy/Group: Individual Therapy  Arland GORMAN Fast 07/15/2023, 3:55 PM

## 2023-07-15 NOTE — Progress Notes (Signed)
 Occupational Therapy Session Note  Patient Details  Name: Marissa Orozco MRN: [id number] Date of Birth: 1945-04-16  {CHL IP REHAB OT TIME CALCULATIONS:[id number]}   Short Term Goals: Week 1:  OT Short Term Goal 1 (Week 1): STGs=LTGs due to patient's estimated length of stay.  Skilled Therapeutic Interventions/Progress Updates:    Patient agreeable to participate in OT session. Reports *** pain level.   Patient participated in skilled OT session focusing on ***. Therapist facilitated/assessed/developed/educated/integrated/elicited *** in order to improve/facilitate/promote    Therapy Documentation Precautions:  Precautions Precautions: Fall Recall of Precautions/Restrictions: Intact Restrictions Weight Bearing Restrictions Per Provider Order: No   Therapy/Group: Individual Therapy  Leita Howell, OTR/L,CBIS  Supplemental OT - [facility] and [facility] Secure Chat Preferred   07/15/2023, 4:55 PM

## 2023-07-15 NOTE — Telephone Encounter (Signed)
 The pt is currently admitted and all information will be given at discharge as well as mailed to the home and sent to My Chart.

## 2023-07-15 NOTE — Progress Notes (Signed)
 Occupational Therapy Session Note  Patient Details  Name: Marissa Orozco MRN: [id number] Date of Birth: [birth year]  Today's Date: 07/15/2023 OT Individual Time: 0847-1000 OT Individual Time Calculation (min): 73 min    Short Term Goals: Week 1:  OT Short Term Goal 1 (Week 1): STGs=LTGs due to patient's estimated length of stay.  Skilled Therapeutic Interventions/Progress Updates:    Patient agreeable to participate in OT session. Reports no pain level.   Patient participated in ADLs with the following levels: UB bathing/ LB bathing - sup at sink  UB/ LB dressing set up Toileting- distant sup  Grooming- IND  Patient participated in UE strengthening with theraball 6# for chest press with dynamic lean forward for 2x10. Patient able to complete functional mobility with picking objects off of floor with and without rollator with CGA. Patient able to complete with no loss of balance. Patient able to complete NuStep for increasing functional activity tolerance. Patient completed functional mobility without rollator for 150 ft. Completed functional mobility with rollator back to room  Therapy Documentation Precautions:  Precautions Precautions: Fall Recall of Precautions/Restrictions: Intact Restrictions Weight Bearing Restrictions Per Provider Order: No   Therapy/Group: Individual Therapy  [name] 07/15/2023, 7:50 AM

## 2023-07-16 DIAGNOSIS — G7281 Critical illness myopathy: Secondary | ICD-10-CM | POA: Diagnosis not present

## 2023-07-16 LAB — GLUCOSE, CAPILLARY
Glucose-Capillary: 130 mg/dL — ABNORMAL HIGH (ref 70–99)
Glucose-Capillary: 80 mg/dL (ref 70–99)
Glucose-Capillary: 191 mg/dL — ABNORMAL HIGH (ref 70–99)
Glucose-Capillary: 174 mg/dL — ABNORMAL HIGH (ref 70–99)

## 2023-07-16 MED ORDER — DICLOFENAC SODIUM 1 % EX GEL: 4.0000 g | Freq: Four times a day (QID) | CUTANEOUS | Status: DC | PRN

## 2023-07-16 NOTE — Progress Notes (Signed)
 Physical Therapy Session Note  Patient Details  Name: Marissa Orozco MRN: [id number] Date of Birth: [birth year]  Today's Date: 07/16/2023 PT Individual Time: 1000-1041 PT Individual Time Calculation (min): 41 min   Short Term Goals: Week 1:  PT Short Term Goal 1 (Week 1): STG = LTG due to ELOS  Skilled Therapeutic Interventions/Progress Updates:   Received pt sitting in WC, pt agreeable to PT treatment, and denied any pain during session. Session with emphasis on functional mobility/transfers, generalized strengthening and endurance, dynamic standing balance/coordination, NMR, and ambulation. Pt performed all transfers without AD and close supervision throughout session. Pt ambulated in/out of bathroom without AD and supervision and able to perform all toileting tasks without assist. Stood at sink and washed hands with distant supervision. Pt then ambulated 155ft x 2 trials without AD and close supervision to/from main therapy gym. Worked on standing mini squats 3x10 with 4lb medicine ball and yellow TB around thighs. Pt then performed lateral stepping in hallway with yellow TB around ankles x72ft with BUE support with emphasis on hip abductor and quad strength - cues for technique.  Pt performed the following exercises in // bars with emphasis on hip flexor, abductor, and quad strength: -forward alternating high knee step ups onto 6in step 2x10 with BUE support fading to 1UE support -lateral step ups onto 6in step with BUE support 2x10 Pt required rest/water breaks in between exercises and reporting 8/10 fatigue at end of session. Returned to room and concluded with x10 sit<>stands from WC (5/10 without UE support and last 5 pushing with BUE support due to fatigue). Concluded session with pt sitting in [facility] with all needs within reach.   Therapy Documentation Precautions:  Precautions Precautions: Fall Recall of Precautions/Restrictions: Intact Restrictions Weight Bearing Restrictions Per  Provider Order: No  Therapy/Group: Individual Therapy Therisa HERO Zaunegger Therisa Stains PT, DPT 07/16/2023, 7:21 AM

## 2023-07-16 NOTE — Progress Notes (Signed)
 PROGRESS NOTE   Subjective/Complaints:  Pt reports some soreness/pain in R foot- on medial ankle and under foot in arch.   No change in Swelling- is sitting up in w/c with legs hanging- no TEDs on.    ROS:   Pt denies SOB, abd pain, CP, N/V/C/D, and vision changes    Per HPI   Objective:   No results found. Recent Labs    07/14/23 1810 07/15/23 0558  WBC 16.7* 16.0*  HGB 13.9 11.6*  HCT 39.8 33.4*  PLT 211 170     Recent Labs    07/14/23 0614 07/15/23 0558  NA 137 137  K 4.2 3.5  CL 109 109  CO2 14* 20*  GLUCOSE 84 82  BUN 23 21  CREATININE 0.96 0.91  CALCIUM  8.2* 8.0*    Intake/Output Summary (Last 24 hours) at 07/16/2023 1019 Last data filed at 07/16/2023 0738 Gross per 24 hour  Intake 378 ml  Output --  Net 378 ml        Physical Exam: Vital Signs Blood pressure 123/63, pulse 68, temperature 97.9 F (36.6 C), temperature source Oral, resp. rate 18, height 5' 3.5 (1.613 m), weight 79.9 kg, SpO2 100%.           General: awake, alert, appropriate, sitting up in w/c- legs hanging down- no TEDs on;  NAD HENT: conjugate gaze; oropharynx moist CV: regular rate and rhythm; no JVD Pulmonary: CTA B/L; no W/R/R- good air movement GI: soft, NT, ND, (+)BS Psychiatric: appropriate- quiet Neurological: Ox3  Extremities;  Mild TTP on arch of R foot and medial ankle- no change in swelling- 2+ LE edema to mid calves B/L LUE- numb in lateral 4th digit and 5th digits-  Extremities: 2+  LE edema to ankles B/L Skin- more shiny-  to swollen feet/ankles- appears like mole/skin lesions scraped and had been bleeding- is not now- looks better MSK: 7/7- B/L LE's - HF 3/5 B/L- otherwise 4+/5 in LE's.  Musculoskeletal:     Cervical back: Normal range of motion and neck supple.     Comments: Ue's- Deltoids 4-/5 otherwise 5-/5 throughout in Ue's LE's- RLE- HF 2+/5; 4+/5 otherwise LLE- HF 2-/5  KE/KF/DF/PF all 4+/5 otherwise, stable 7/6   Skin:    General: Skin is warm and dry.     Capillary Refill: Capillary refill takes less than 2 seconds.     Coloration: Skin is pale.     Comments: No skin breakdown seen  Neurological:     Mental Status: She is alert.     Sensory: No sensory deficit.     Comments: Intact to light touch in all 4 extremities Voice OK, but sounds somewhat weak Ox3 and appropriate- but slightly delayed responses Assessment/Plan: 1. Functional deficits which require 3+ hours per day of interdisciplinary therapy in a comprehensive inpatient rehab setting. Physiatrist is providing close team supervision and 24 hour management of active medical problems listed below. Physiatrist and rehab team continue to assess barriers to discharge/monitor patient progress toward functional and medical goals  Care Tool:  Bathing    Body parts bathed by patient: Right arm, Left arm, Chest, Abdomen, Front perineal area, Buttocks, Right upper  leg, Left upper leg, Right lower leg, Left lower leg, Face         Bathing assist Assist Level: Supervision/Verbal cueing     Upper Body Dressing/Undressing Upper body dressing   What is the patient wearing?: Pull over shirt    Upper body assist Assist Level: Set up assist    Lower Body Dressing/Undressing Lower body dressing      What is the patient wearing?: Underwear/pull up, Pants     Lower body assist Assist for lower body dressing: Supervision/Verbal cueing     Toileting Toileting    Toileting assist Assist for toileting: Contact Guard/Touching assist     Transfers Chair/bed transfer  Transfers assist     Chair/bed transfer assist level: Supervision/Verbal cueing     Locomotion Ambulation   Ambulation assist      Assist level: Supervision/Verbal cueing Assistive device: Walker-rolling Max distance: >800'   Walk 10 feet activity   Assist     Assist level: Independent with assistive  device Assistive device: No Device   Walk 50 feet activity   Assist    Assist level: Supervision/Verbal cueing Assistive device: No Device    Walk 150 feet activity   Assist    Assist level: Independent with assistive device Assistive device: Walker-rolling    Walk 10 feet on uneven surface  activity   Assist     Assist level: Contact Guard/Touching assist Assistive device: Walker-rolling   Wheelchair     Assist Is the patient using a wheelchair?: No Type of Wheelchair: Manual    Wheelchair assist level: Dependent - Patient 0% Max wheelchair distance: 150'    Wheelchair 50 feet with 2 turns activity    Assist        Assist Level: Dependent - Patient 0%   Wheelchair 150 feet activity     Assist      Assist Level: Dependent - Patient 0%   Blood pressure 123/63, pulse 68, temperature 97.9 F (36.6 C), temperature source Oral, resp. rate 18, height 5' 3.5 (1.613 m), weight 79.9 kg, SpO2 100%.  Medical Problem List and Plan: 1. 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  Antithrombotics: -DVT/anticoagulation:  Mechanical: Sequential compression devices, below knee Bilateral lower extremities             -antiplatelet therapy: resume ASA 81 mg 7/4 3. Pain Management: No meds ordered- pt denies pain- cannot have tylenol  or NSAIDs due to LFT elevation/autoimmune hepatitis   7/12- will add Voltaren  gel QID prn for R foot pain 4. Mood/Behavior/Sleep: LCSW to follow for evaluation and support when available.              -antipsychotic agents: N/A 5. Neuropsych/cognition: This patient is capable of making decisions on her own behalf. 6. Skin/Wound Care: Routine pressure relief measures  7. Fluids/Electrolytes/Nutrition: CMET  in a.m   -Daily weights- Strict I&O              -Renal w/ 1200 fluid restrict              -Severe protien calorie malnutrition: add Glucerna -  Alb 1.6- concern that it's due to her hepatitis- will monitor -recheck next Monday             -Vitamin C             7/4- changed Fluid restriction to 1800cc  7/8- Albumin up to 1.8  -spoke to Renal- they recommended stopping fluid restriction- and stop Lasix - use only as needed-for edema-  since doesn't have CHF- also put on 2G sodium diet per renal-   7/11- Alb 1.7- likely cause of LE edema- which is worse since stopped Lasix , but Lasix  can affect renal function, so don't want to keep her on it- also husband to bring in [employer] supplements to help with low albumin  7/12- will check labs Monday 8. Hypertension: holding home amlodipine   -continue Lasix  40 mg P.O twice daily              - CTM BPs closely  7/8- BP controlled- taking off Lasix  but that rarely affects BP significantly- con't to monitor 9. Hypokalemia: K+ now 3.3 Goal K+ > 4.0 and Mag > 2.0.  -continue potassium 20 meq daily              -will recheck levels in AM  7/4- K+ 3.5 this AM- might need to increase to 40 mEq  7/7- K+ up to 3.8 this AM  7/8- stopped Lasix  so stopped Kcl  7/10- K+ up to 4.2- doing better  7/11- K+- 3.5- due to Lasix  yesterday-will give Kcl 40 meq daily prn if gives Lasix  10. Transaminitis/Elevated Liver Enzyme due to Autoimmune hepatitis/pancreatic cyst/dilated bile duct: Monitor Hepatic panel + CBC w/diff -Repeat LFT's on 7/7 and 7/10 per GI  -Prednisone  40 mg started 07/06/23- Taper by 5 mg/week until done.              TMPT-pending may need to start Thiopurine if normal  -will need ASAP f/u with GI and hepatology after d/c- GI will attempt to work up pancreatic cyst and bile duct dilation while here.  7/9- will recheck LFTs in AM to monitor-  7/10- LFTs down to 203/192 so still improving  7/12- will recheck labs Monday  11. Hyperlipidemia: continue  holding atorvastatin  due to abnormal LFTs.  12. Glaucoma: continue Dorzolamide -Timolol  and Latanoprost    13. Pre-Diabetes: A1c 6.2. now on prednisone               -monitor BS ac/hs and use SSI for elevated BS due to high doses of Prednisone   7/4- BG dropped to 69 overnight- but will order a night snack to help  7/7- Cbgs 96-166-- con't regimen  7/8- Cbgs 78 to 200- will not be able to start metformin  due to Cr, however will add nighttime snack- also d/w pt so she can ask for it  7/9- got snack last night- BG was 115 this AM as a result- spiked to 203 at 4pm yesterday but was actually low in AM- less than 100 when had blurry vision- will monitor if there's a trend with blurry vision and BG's  7/10- CBGs 76 to 188 in last 24 hours- getting night snacks- since titrating down on Prednisone , hopefully will improve-   7/11- Cbgs still elevated- will try Metformin  500 mg BID for hyperglycemia- and stop when pt's Prednisone  gets to 20 mg- her BG's should be well enough controlled by then  7/12- given Metformin  last night- hard to tell if made difference yet. But last night was 168- and didn't spike to 190's 14.  Class I obesity: Counsel on diet modifications and exercise to facilitate weight loss.  15. Leukocytosis- just started Prednisone  today- I'm not sure would cause WBC to go to 19k from 10.9k so fast- will recheck in AM and monitor clinically for possible infection  7/4- WBC 19.3- since not still going up, I think more likely from Prednisone - just started after 1 dose of Prednisone -   7/8-7/9 down to 17.1k- will con't to monitor- recheck Thursday- is afebrile  7/10- labs pending- called nurse to look for the labs for me- 7/11- WBC down to 16k- so improving with reduction in Prednisone - so unlikely due to illness  16. Thrombocytopenia- Plts dropped to 107k today from 155k- will recheck in AM- if better, will check Monday/Thursdays- to reduce lab draws  7/4- Plt sup to 157k  7/7- Plts 200k  7/11-  Pl;ts 170k  17. ARF s/p E Coli- with improving renal function  7/4- Cr down to 1.57 and BUN slightly up to 41 (not drinking well)  -encouraged pt to drink more- also will make fluid restriction 1800cc now  7/7- Cr down to 1.42  from 1.57 and BUN 33 form 41  7/8- stopped fluid restriction and  Lasix  40 mg BID per d/w renal- put on 2G sodium diet  7/10- Cr down to normal- 0.96 and BUN 23- doing SO much better  7/11- K+ 3.5 and BUN 21 and Cr 0.91- doing better 18. Lower extremity edema: worse as per patient, additional Lasix  20mg  administered 7/5, discussed with patient and she feels edema improved 7/6 and does not want additional lasix   7/7- Will add TEDS due to Sx's of lightheadedness- just in case- no documentation of low BP.   7/9- Not wearing TEDS yet- d/w pt, needs to have them put on- due to LE edema which is 1-2+ this AM- likley dfue to stopping Lasix - but don't see that Lasix  dose needed for mild edema- will monitor  7/10- Wearing TEDs daily- will give 1 dose Lasix  40 mg x1 today- wait on more KCL since K+ 4.2- and monitor- since ankles 2+ edema  7/11- didn't help much- likely due to Alb of 1.7- which is causing fluid to stay in tissues, not in vessels- so Lasix  not as effective- will try [employer] to help raise her albumin- since has N/V with Glucerna and other supplements- will also have husband bring in [place] which works for her.  19. LUE ulnar neuropathy Sx's  7/10- likley due ot compression overnight or with w/c- will ask therapy to get elbow protector for pt.   7/11- no numbness/tingling this AM 20. Anemia  7/11- Pt's has drop in Hb to 11.6- which is unusual- having no bleeding that she's aware of anywhere including stools, but LBM 2 days ago- verified Protonix - and saw wasn't on- so started immediately.   7/12- will check labs Monday   I spent a total of 36   minutes on total care today- >50% coordination of care- due to  D/w pt and nursing about pain; labs; and LE edema- also  d/w PA   LOS: 9 days A FACE TO FACE EVALUATION WAS PERFORMED  Toneisha Savary 07/16/2023, 10:19 AM

## 2023-07-16 NOTE — Patient Instructions (Signed)
 1) Strengthening: Chest Pull - Red   Hold Theraband in front of body with hands about shoulder width a part. Pull band a part and back together slowly. Repeat __10-15__ times. Complete ___1_ set(s) per session.. Repeat ___1_ session(s) per day.  http://orth.exer.us /926   Copyright  VHI. All rights reserved.   2) PNF Strengthening - Red   Standing with resistive band around each hand, bring right arm up and away, thumb back. Repeat _10-15___ times per set. Do __1__ sets per session. Do __1__ sessions per day.    3) Resisted External Rotation: in Neutral - Bilateral- Red   Sit or stand, tubing in both hands, elbows at sides, bent to 90, forearms forward. Pinch shoulder blades together and rotate forearms out. Keep elbows at sides. Repeat _10-15___ times per set. Do __1__ sets per session. Do _1___ sessions per day.  http://orth.exer.us /966   Copyright  VHI. All rights reserved.   4) PNF Strengthening: Resisted - Red   Standing, hold resistive band above head. Bring right arm down and out from side. Repeat _10-15___ times per set. Do __1__ sets per session. Do __1__ sessions per day.  http://orth.exer.us /922   Copyright  VHI. All rights reserved.    ELASTIC BAND TRICEPS EXTENSION - SELF FIXATION - Green  While seated, hold and fixate one end of an elastic band against your chest. Hold the other end with your opposite hand with your elbow bent and arm by your side.   Start by pulling the band downward so that the elbow goes from a bent position to a straightened position as shown. Return to starting position and repeat.  Complete 10-15 repetitions, 1 set.     Theraband Elbow Flexion - Green  Loop the middle of the band around one foot  With arms straight by side and palms facing forward, hold onto each end of the band Bend arms bringing hands toward shoulders, keeping elbows by your side Return to starting position . Complete 10-15 repetitions, 1 set.

## 2023-07-16 NOTE — Progress Notes (Addendum)
 Occupational Therapy Session Note  Patient Details  Name: [name] MRN: [id number] Date of Birth: [birth year]  Today's Date: 07/16/2023 OT Individual Time: 9154-9068 OT Individual Time Calculation (min): 46 min    Short Term Goals: Week 1:  OT Short Term Goal 1 (Week 1): STGs=LTGs due to patient's estimated length of stay.  Skilled Therapeutic Interventions/Progress Updates:    Pt received sitting up with no c/o pain, agreeable to OT session starting with shower. She completed functional mobility using the RW into the bathroom with mod I. She doffed all clothing with appropriate safety awareness/sequencing, and with mod i. She bathed seated on TTB with mod I. Discussed home set up and d/c planning during shower. She dressed following shower with mod I for UB, (S) for LB when dressing in standing. Provided edu on importance of UE support when dressing in standing. She used the rollator to complete 200 ft of functional mobility to the therapy gym with distant (S). She transferred to supine on the mat and completed the following exercises- glute bridges and R/L clamshells with level 1 resistance band. Exercises completed to strengthen proximal muscle loss- glutes, hips, core, all for carryover to increased independence with ADL transfers and reduced fall risk. 2x20 repetitions completed. Introduced a single point cane for to try. She was able to safely use cane following demonstration with close (S) back to her room, 200 ft. She reports still feeling safer with a Rw. Discussed benefits of using both pieces of AD and encouraged her to f/u with PT. She was left sitting up in the w/c with all needs met.   Therapy Documentation Precautions:  Precautions Precautions: Fall Recall of Precautions/Restrictions: Intact Restrictions Weight Bearing Restrictions Per Provider Order: No   Therapy/Group: Individual Therapy  [name] 07/16/2023, 7:52 AM

## 2023-07-17 DIAGNOSIS — G7281 Critical illness myopathy: Secondary | ICD-10-CM | POA: Diagnosis not present

## 2023-07-17 LAB — GLUCOSE, CAPILLARY
Glucose-Capillary: 96 mg/dL (ref 70–99)
Glucose-Capillary: 95 mg/dL (ref 70–99)
Glucose-Capillary: 168 mg/dL — ABNORMAL HIGH (ref 70–99)

## 2023-07-17 NOTE — Progress Notes (Signed)
 PROGRESS NOTE   Subjective/Complaints:  Pt reports doing OK- in shower with OT at side.   ROS:    Pt denies SOB, abd pain, CP, N/V/C/D, and vision changes   Per HPI   Objective:   No results found. Recent Labs    07/14/23 1810 07/15/23 0558  WBC 16.7* 16.0*  HGB 13.9 11.6*  HCT 39.8 33.4*  PLT 211 170     Recent Labs    07/15/23 0558  NA 137  K 3.5  CL 109  CO2 20*  GLUCOSE 82  BUN 21  CREATININE 0.91  CALCIUM  8.0*    Intake/Output Summary (Last 24 hours) at 07/17/2023 0847 Last data filed at 07/17/2023 0758 Gross per 24 hour  Intake 720 ml  Output --  Net 720 ml        Physical Exam: Vital Signs Blood pressure (!) 147/74, pulse 74, temperature 98.3 F (36.8 C), temperature source Oral, resp. rate 18, height 5' 3.5 (1.613 m), weight 79.6 kg, SpO2 100%.           General: awake, alert, appropriate, sitting on shower chair in shower- wet; OT in room; NAD HENT: conjugate gaze; oropharynx moist CV: regular rate; no JVD Pulmonary: CTA B/L; no W/R/R- good air movement GI: soft, NT, ND, (+)BS Psychiatric: appropriate- quiet Neurological: Ox3   Extremities;  Mild TTP on arch of R foot and medial ankle- no change in swelling- 2+ LE edema to mid calves B/L LUE- numb in lateral 4th digit and 5th digits-  Extremities: 2+  LE edema to ankles B/L Skin- more shiny-  to swollen feet/ankles- appears like mole/skin lesions scraped and had been bleeding- is not now- looks better MSK: 7/7- B/L LE's - HF 3/5 B/L- otherwise 4+/5 in LE's.  Musculoskeletal:     Cervical back: Normal range of motion and neck supple.     Comments: Ue's- Deltoids 4-/5 otherwise 5-/5 throughout in Ue's LE's- RLE- HF 2+/5; 4+/5 otherwise LLE- HF 2-/5 KE/KF/DF/PF all 4+/5 otherwise, stable 7/6   Skin:    General: Skin is warm and dry.     Capillary Refill: Capillary refill takes less than 2 seconds.     Coloration:  Skin is pale.     Comments: No skin breakdown seen  Neurological:     Mental Status: She is alert.     Sensory: No sensory deficit.     Comments: Intact to light touch in all 4 extremities Voice OK, but sounds somewhat weak Ox3 and appropriate- but slightly delayed responses Assessment/Plan: 1. Functional deficits which require 3+ hours per day of interdisciplinary therapy in a comprehensive inpatient rehab setting. Physiatrist is providing close team supervision and 24 hour management of active medical problems listed below. Physiatrist and rehab team continue to assess barriers to discharge/monitor patient progress toward functional and medical goals  Care Tool:  Bathing    Body parts bathed by patient: Right arm, Left arm, Abdomen, Chest, Front perineal area, Buttocks, Right upper leg, Left upper leg, Right lower leg, Left lower leg, Face         Bathing assist Assist Level: Independent with assistive device     Upper Body Dressing/Undressing  Upper body dressing   What is the patient wearing?: Pull over shirt    Upper body assist Assist Level: Independent with assistive device    Lower Body Dressing/Undressing Lower body dressing      What is the patient wearing?: Underwear/pull up, Pants     Lower body assist Assist for lower body dressing: Independent with assitive device     Toileting Toileting    Toileting assist Assist for toileting: Independent with assistive device     Transfers Chair/bed transfer  Transfers assist     Chair/bed transfer assist level: Independent with assistive device     Locomotion Ambulation   Ambulation assist      Assist level: Supervision/Verbal cueing Assistive device: Walker-rolling Max distance: >800'   Walk 10 feet activity   Assist     Assist level: Independent with assistive device Assistive device: No Device   Walk 50 feet activity   Assist    Assist level: Supervision/Verbal cueing Assistive  device: No Device    Walk 150 feet activity   Assist    Assist level: Independent with assistive device Assistive device: Walker-rolling    Walk 10 feet on uneven surface  activity   Assist     Assist level: Contact Guard/Touching assist Assistive device: Walker-rolling   Wheelchair     Assist Is the patient using a wheelchair?: No Type of Wheelchair: Manual    Wheelchair assist level: Dependent - Patient 0% Max wheelchair distance: 150'    Wheelchair 50 feet with 2 turns activity    Assist        Assist Level: Dependent - Patient 0%   Wheelchair 150 feet activity     Assist      Assist Level: Dependent - Patient 0%   Blood pressure (!) 147/74, pulse 74, temperature 98.3 F (36.8 C), temperature source Oral, resp. rate 18, height 5' 3.5 (1.613 m), weight 79.6 kg, SpO2 100%.  Medical Problem List and Plan: 1. 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  Antithrombotics: -DVT/anticoagulation:  Mechanical: Sequential compression devices, below knee Bilateral lower extremities             -antiplatelet therapy: resume ASA 81 mg 7/4 3. Pain Management: No meds ordered- pt denies pain- cannot have tylenol  or NSAIDs due to LFT elevation/autoimmune hepatitis   7/12- will add Voltaren  gel QID prn for R foot pain  7/13- no complaints this AM 4. Mood/Behavior/Sleep: LCSW to follow for evaluation and support when available.              -antipsychotic agents: N/A 5. Neuropsych/cognition: This patient is capable of making decisions on her own behalf. 6. Skin/Wound Care: Routine pressure relief measures  7. Fluids/Electrolytes/Nutrition: CMET in a.m   -Daily weights-  Strict I&O              -Renal w/ 1200 fluid restrict              -Severe protien calorie malnutrition: add Glucerna - Alb 1.6- concern  that it's due to her hepatitis- will monitor -recheck next Monday             -Vitamin C             7/4- changed Fluid restriction to 1800cc  7/8- Albumin up to 1.8  -spoke to Renal- they recommended stopping fluid restriction- and stop Lasix - use only as needed-for edema-  since doesn't have CHF- also put on 2G sodium diet per renal-   7/11- Alb 1.7- likely cause of LE edema- which is worse since stopped Lasix , but Lasix  can affect renal function, so don't want to keep her on it- also husband to bring in [employer] supplements to help with low albumin  7/12- will check labs Monday 8. Hypertension: holding home amlodipine   -continue Lasix  40 mg P.O twice daily              - CTM BPs closely  7/8- BP controlled- taking off Lasix  but that rarely affects BP significantly- con't to monitor 9. Hypokalemia: K+ now 3.3 Goal K+ > 4.0 and Mag > 2.0.  -continue potassium 20 meq daily              -will recheck levels in AM  7/4- K+ 3.5 this AM- might need to increase to 40 mEq  7/7- K+ up to 3.8 this AM  7/8- stopped Lasix  so stopped Kcl  7/10- K+ up to 4.2- doing better  7/11- K+- 3.5- due to Lasix  yesterday-will give Kcl 40 meq daily prn if gives Lasix  10. Transaminitis/Elevated Liver Enzyme due to Autoimmune hepatitis/pancreatic cyst/dilated bile duct: Monitor Hepatic panel + CBC w/diff -Repeat LFT's on 7/7 and 7/10 per GI  -Prednisone  40 mg started 07/06/23- Taper by 5 mg/week until done.              TMPT-pending may need to start Thiopurine if normal  -will need ASAP f/u with GI and hepatology after d/c- GI will attempt to work up pancreatic cyst and bile duct dilation while here.  7/9- will recheck LFTs in AM to monitor-  7/10- LFTs down to 203/192 so still improving  7/12- will recheck labs Monday  11. Hyperlipidemia: continue holding atorvastatin  due to  abnormal LFTs.  12. Glaucoma: continue Dorzolamide -Timolol  and Latanoprost    13. Pre-Diabetes: A1c 6.2. now on prednisone               -monitor BS ac/hs and use SSI for elevated BS due to high doses of Prednisone   7/4- BG dropped to 69 overnight- but will order a night snack to help  7/7- Cbgs 96-166-- con't regimen  7/8- Cbgs 78 to 200- will not be able to start metformin  due to Cr, however will add nighttime snack- also d/w pt so she can ask for it  7/9- got snack last night- BG was 115 this AM as a result- spiked to 203 at 4pm yesterday but was actually low in AM- less than 100 when had blurry vision- will monitor if there's a trend with blurry vision and BG's  7/10- CBGs 76 to 188 in last 24 hours- getting night snacks- since titrating down on Prednisone , hopefully will improve-   7/11- Cbgs still elevated- will try Metformin  500 mg BID for hyperglycemia- and stop when pt's Prednisone  gets to 20 mg- her BG's should be well enough controlled by then  7/12- given Metformin  last night- hard to tell if made difference yet. But last night was 168- and didn't spike to 190's 14.  Class I obesity:  Counsel on diet modifications and exercise to facilitate weight loss.  15. Leukocytosis- just started Prednisone  today- I'm not sure would cause WBC to go to 19k from 10.9k so fast- will recheck in AM and monitor clinically for possible infection  7/4- WBC 19.3- since not still going up, I think more likely from Prednisone - just started after 1 dose of Prednisone -   7/8-7/9 down to 17.1k- will con't to monitor- recheck Thursday- is afebrile  7/10- labs pending- called nurse to look for the labs for me- 7/11- WBC down to 16k- so improving with reduction in Prednisone - so unlikely due to illness  16. Thrombocytopenia- Plts dropped to 107k today from 155k- will recheck in AM- if better, will check Monday/Thursdays- to reduce lab draws  7/4- Plt sup to 157k  7/7- Plts 200k  7/11- Pl;ts 170k  17. ARF s/p E  Coli- with improving renal function  7/4- Cr down to 1.57 and BUN slightly up to 41 (not drinking well)  -encouraged pt to drink more- also will make fluid restriction 1800cc now  7/7- Cr down to 1.42  from 1.57 and BUN 33 form 41  7/8- stopped fluid restriction and  Lasix  40 mg BID per d/w renal- put on 2G sodium diet  7/10- Cr down to normal- 0.96 and BUN 23- doing SO much better  7/11- K+ 3.5 and BUN 21 and Cr 0.91- doing better 18. Lower extremity edema: worse as per patient, additional Lasix  20mg  administered 7/5, discussed with patient and she feels edema improved 7/6 and does not want additional lasix   7/7- Will add TEDS due to Sx's of lightheadedness- just in case- no documentation of low BP.   7/9- Not wearing TEDS yet- d/w pt, needs to have them put on- due to LE edema which is 1-2+ this AM- likley dfue to stopping Lasix - but don't see that Lasix  dose needed for mild edema- will monitor  7/10- Wearing TEDs daily- will give 1 dose Lasix  40 mg x1 today- wait on more KCL since K+ 4.2- and monitor- since ankles 2+ edema  7/11- didn't help much- likely due to Alb of 1.7- which is causing fluid to stay in tissues, not in vessels- so Lasix  not as effective- will try Prosource to help raise her albumin- since has N/V with Glucerna and other supplements- will also have husband bring in [place] which works for her.  19. LUE ulnar neuropathy Sx's  7/10- likley due ot compression overnight or with w/c- will ask therapy to get elbow protector for pt.   7/11- no numbness/tingling this AM 20. Anemia  7/11- Pt's has drop in Hb to 11.6- which is unusual- having no bleeding that she's aware of anywhere including stools, but LBM 2 days ago- verified Protonix - and saw wasn't on- so started immediately.   7/12- will check labs Monday    The patient is medically ready for discharge to home and will not need follow-up with [facility] PM[facility]. In addition, they will need to follow up with their PCP,  GI.     LOS: 10 days A FACE TO FACE EVALUATION WAS PERFORMED  Ortencia Askari 07/17/2023, 8:47 AM

## 2023-07-17 NOTE — Progress Notes (Signed)
 Occupational Therapy Discharge Summary  Patient Details  Name: Marissa Orozco MRN: [id number] Date of Birth: [birth year]  Date of Discharge from OT service:July 17, 2023  Today's Date: 07/17/2023 OT Individual Time: 0800-0900 OT Individual Time Calculation (min): 60 min    Patient has met 8 of 8 long term goals due to improved activity tolerance, improved balance, postural control, and ability to compensate for deficits.  Patient to discharge at overall Modified Independent level.  Patient's care partner is independent to provide the necessary physical assistance at discharge.    Reasons goals not met: All goals mey  Recommendation:  Patient will benefit from ongoing skilled OT services in home health setting to continue to advance functional skills in the area of BADL, iADL, and Reduce care partner burden.  Equipment: TTB and rollator   Reasons for discharge: treatment goals met and discharge from hospital  Patient/family agrees with progress made and goals achieved: Yes  OT Discharge Precautions/Restrictions    General   Vital Signs Therapy Vitals Temp: 98.3 F (36.8 C) Temp Source: Oral Pulse Rate: 74 Resp: 18 BP: (!) 147/74 Patient Position (if appropriate): Sitting Oxygen Therapy SpO2: 100 % O2 Device: Room Air Pain   ADL ADL Eating: Independent Where Assessed-Eating: Wheelchair Grooming: Modified independent Where Assessed-Grooming: Sitting at sink Upper Body Bathing: Supervision/safety, Setup Where Assessed-Upper Body Bathing: Shower Lower Body Bathing: Contact guard Where Assessed-Lower Body Bathing: Shower Upper Body Dressing: Supervision/safety, Setup Where Assessed-Upper Body Dressing: Wheelchair Lower Body Dressing: Contact guard Where Assessed-Lower Body Dressing: Wheelchair Toileting: Contact guard, Minimal assistance Where Assessed-Toileting: Teacher, adult education: Advertising account executive, Minimal assistance Toilet Transfer Method:  Proofreader: Acupuncturist: Contact guard, Minimal assistance Film/video editor Method: Designer, industrial/product: Emergency planning/management officer, Grab bars ADL Comments: Functional transfers completed with AD, otherwise supervision for mobility with use of RW. Vision   Perception    Praxis   Cognition   Sensation   Motor    Mobility     Trunk/Postural Assessment     Balance   Extremity/Trunk Assessment      Tx Session  General:  Pt seated in W/C upon OT arrival, agreeable to OT.  Pain: no pain reported  ADL: OT providing skilled intervention with ADL retraining. Pt was able to complete ADL tasks and transfers at mod I with good safety awareness and good awareness of energy conservation strategies without VC required. OT issued mod I sign for room and educated pt about use   Exercises: OT issued HEP in order to increase functional strength, endurance and activity tolerance in order to increase independence in ADLs. OT issued red theraband and discussed direction/technique of exercises, demonstrating verbal understanding. Pt completed 3x10 exercises listed below: -elbow extensions - shoulder horizontal abduction -bicep curls  -shoulder flexion -diagonal shoulder flexion -external rotation   Pt seated in W/C at end of session with call light within reach and 4Ps assessed.    Camie Hoe, OTD, OTR/L 07/17/2023, 9:02 AM

## 2023-07-17 NOTE — Plan of Care (Signed)
  Problem: RH Balance Goal: LTG Patient will maintain dynamic standing with ADLs (OT) Description: LTG:  Patient will maintain dynamic standing balance with assist during activities of daily living (OT)  Outcome: Completed/Met Flowsheets (Taken 07/08/2023 0921 by [place] Nereida HERO, OT) LTG: Pt will maintain dynamic standing balance during ADLs with: Independent with assistive device   Problem: RH Bathing Goal: LTG Patient will bathe all body parts with assist levels (OT) Description: LTG: Patient will bathe all body parts with assist levels (OT) Outcome: Completed/Met Flowsheets Taken 07/17/2023 1248 by Creasie Camie MATSU, OT LTG: Position pt will perform bathing: Shower Taken 07/08/2023 0921 by [place] Nereida HERO, OT LTG: Pt will perform bathing with assistance level/cueing: Independent with assistive device    Problem: RH Dressing Goal: LTG Patient will perform lower body dressing w/assist (OT) Description: LTG: Patient will perform lower body dressing with assist, with/without cues in positioning using equipment (OT) Outcome: Completed/Met Flowsheets (Taken 07/08/2023 0921 by [place] Nereida HERO, OT) LTG: Pt will perform lower body dressing with assistance level of: Independent with assistive device   Problem: RH Toileting Goal: LTG Patient will perform toileting task (3/3 steps) with assistance level (OT) Description: LTG: Patient will perform toileting task (3/3 steps) with assistance level (OT)  Outcome: Completed/Met Flowsheets (Taken 07/08/2023 0921 by [place] Nereida HERO, OT) LTG: Pt will perform toileting task (3/3 steps) with assistance level: Independent with assistive device   Problem: RH Simple Meal Prep Goal: LTG Patient will perform simple meal prep w/assist (OT) Description: LTG: Patient will perform simple meal prep with assistance, with/without cues (OT). Outcome: Completed/Met Flowsheets (Taken 07/08/2023 0921 by [place] Nereida HERO, OT) LTG: Pt will  perform simple meal prep with assistance level of: Independent with assistive device   Problem: RH Light Housekeeping Goal: LTG Patient will perform light housekeeping w/assist (OT) Description: LTG: Patient will perform light housekeeping with assistance, with/without cues (OT). Outcome: Completed/Met Flowsheets (Taken 07/08/2023 0921 by [place] Nereida HERO, OT) LTG: Pt will perform light housekeeping with assistance level of: Independent with assistive device   Problem: RH Toilet Transfers Goal: LTG Patient will perform toilet transfers w/assist (OT) Description: LTG: Patient will perform toilet transfers with assist, with/without cues using equipment (OT) Outcome: Completed/Met Flowsheets (Taken 07/08/2023 0921 by [place] Nereida HERO, OT) LTG: Pt will perform toilet transfers with assistance level of: Independent with assistive device   Problem: RH Tub/Shower Transfers Goal: LTG Patient will perform tub/shower transfers w/assist (OT) Description: LTG: Patient will perform tub/shower transfers with assist, with/without cues using equipment (OT) Outcome: Completed/Met Flowsheets (Taken 07/08/2023 0921 by [place] Nereida HERO, OT) LTG: Pt will perform tub/shower stall transfers with assistance level of: Independent with assistive device

## 2023-07-17 NOTE — Progress Notes (Addendum)
 Physical Therapy Discharge Summary  Patient Details  Name: Marissa Orozco MRN: [id number] Date of Birth: April 08, 1945  Date of Discharge from PT service:July 17, 2023  Today's Date: 07/17/2023 PT Individual Time: 0916-1011 PT Individual Time Calculation (min): 55 min    Patient has met 8 of 8 long term goals due to improved activity tolerance, improved balance, and increased strength.  Patient to discharge at an ambulatory level Modified Independent.   Patient's care partner is independent to provide the necessary physical assistance at discharge.  Reasons goals not met:   Recommendation:  Patient will benefit from ongoing skilled PT services in home health setting to continue to advance safe functional mobility, address ongoing impairments in strength and activity tolerance , and minimize fall risk.  Equipment: Rollator   Reasons for discharge: discharge from hospital  Patient/family agrees with progress made and goals achieved: Yes  PT Discharge Precautions/Restrictions Precautions Precautions: Fall Recall of Precautions/Restrictions: Intact Restrictions Weight Bearing Restrictions Per Provider Order: No Vital Signs   Pain Pain Assessment Pain Scale: 0-10 Pain Score: 0-No pain Pain Interference Pain Interference Pain Effect on Sleep: 1. Rarely or not at all Pain Interference with Therapy Activities: 1. Rarely or not at all Pain Interference with Day-to-Day Activities: 1. Rarely or not at all Vision/Perception  Vision - History Ability to See in Adequate Light: 0 Adequate Perception Perception: Within Functional Limits Praxis Praxis: WFL  Cognition Overall Cognitive Status: Within Functional Limits for tasks assessed Arousal/Alertness: Awake/alert Orientation Level: Oriented X4 Year: 2025 Month: July Day of Week: Correct Memory: Appears intact Awareness: Appears intact Problem Solving: Appears intact Safety/Judgment: Appears  intact Sensation Sensation Light Touch: Appears Intact Hot/Cold: Appears Intact Proprioception: Appears Intact Stereognosis: Not tested Coordination Gross Motor Movements are Fluid and Coordinated: Yes Fine Motor Movements are Fluid and Coordinated: Yes Motor  Motor Motor: Within Functional Limits  Mobility Bed Mobility Bed Mobility: Rolling Right;Rolling Left;Supine to Sit;Sit to Supine Rolling Right: Independent with assistive device Rolling Left: Independent with assistive device Supine to Sit: Independent with assistive device Sitting - Scoot to Edge of Bed: Independent with assistive device Sit to Supine: Independent with assistive device Scooting to [facility]: Independent with assistive device Transfers Transfers: Sit to Stand;Stand to Sit;Stand Pivot Transfers Sit to Stand: Independent with assistive device Stand to Sit: Independent with assistive device Stand Pivot Transfers: Independent with assistive device Transfer (Assistive device): Rollator Locomotion  Gait Ambulation: Yes Gait Assistance: Independent with assistive device Gait Distance (Feet): 300 Feet Assistive device: Rolling walker Gait Gait: Yes Stairs / Additional Locomotion Stairs: Yes Stairs Assistance: Independent with assistive device Stair Management Technique: Two rails Number of Stairs: 12 Height of Stairs: 6 Pick up small object from the floor assist level: Independent with assistive device Pick up small object from the floor assistive device: reacher Wheelchair Mobility Wheelchair Mobility: No  Trunk/Postural Assessment  Cervical Assessment Cervical Assessment: Within Functional Limits Thoracic Assessment Thoracic Assessment: Within Functional Limits Lumbar Assessment Lumbar Assessment: Within Functional Limits Postural Control Postural Control: Within Functional Limits  Balance Balance Balance Assessed: Yes Static Sitting Balance Static Sitting - Balance Support: Feet  supported Static Sitting - Level of Assistance: 7: Independent Dynamic Sitting Balance Dynamic Sitting - Balance Support: Feet supported Dynamic Sitting - Level of Assistance: 6: Modified independent (Device/Increase time) Dynamic Sitting - Balance Activities: Reaching for objects Static Standing Balance Static Standing - Balance Support: During functional activity;Bilateral upper extremity supported Static Standing - Level of Assistance: 6: Modified independent (Device/Increase time) Dynamic Standing Balance  Dynamic Standing - Balance Support: During functional activity;Bilateral upper extremity supported Dynamic Standing - Level of Assistance: 6: Modified independent (Device/Increase time) Extremity Assessment  RUE Assessment RUE Assessment: Within Functional Limits LUE Assessment LUE Assessment: Within Functional Limits RLE Assessment RLE Assessment: Within Functional Limits Active Range of Motion (AROM) Comments: WFLs General Strength Comments: grossly 4/5 LLE Assessment LLE Assessment: Within Functional Limits Active Range of Motion (AROM) Comments: WFLs General Strength Comments: grossly 4/5  Treatment session: Pt performed bed mobility with mod I. Pt performed all transfers with rollator and mod I. Pt ambulated 250, 150, and 300 feet with rollator and mod I. Pt performed car transfers with mod I. Pt picked an object up off the floor with reacher and mod I. Pt ambulated across uneven terrain with rollator and S. Pt performed car transfers with S. Pt ascended/descended 12 stairs with B rails and mod I. Pt performed cone taps and alternating cone taps 3 sets x 10 reps each for LE strengthening. Pt returned to room and left sitting up in w/c with all needs within reach.    Marissa Orozco 07/17/2023, 10:18 AM

## 2023-07-17 NOTE — Progress Notes (Signed)
 Uneventful night. BLE with pitting edema. Agreed to wear SCD's. Cleave Ternes A

## 2023-07-18 ENCOUNTER — Other Ambulatory Visit (HOSPITAL_COMMUNITY): Payer: Self-pay

## 2023-07-18 ENCOUNTER — Inpatient Hospital Stay: Admitting: Internal Medicine

## 2023-07-18 DIAGNOSIS — R7303 Prediabetes: Secondary | ICD-10-CM

## 2023-07-18 DIAGNOSIS — R7401 Elevation of levels of liver transaminase levels: Secondary | ICD-10-CM

## 2023-07-18 DIAGNOSIS — D72829 Elevated white blood cell count, unspecified: Secondary | ICD-10-CM

## 2023-07-18 DIAGNOSIS — D649 Anemia, unspecified: Secondary | ICD-10-CM

## 2023-07-18 LAB — CBC WITH DIFFERENTIAL/PLATELET
Abs Immature Granulocytes: 0.16 K/uL — ABNORMAL HIGH (ref 0.00–0.07)
Basophils Absolute: 0.1 K/uL (ref 0.0–0.1)
Basophils Relative: 0 %
Eosinophils Absolute: 0.1 K/uL (ref 0.0–0.5)
Eosinophils Relative: 1 %
HCT: 34.6 % — ABNORMAL LOW (ref 36.0–46.0)
Hemoglobin: 12 g/dL (ref 12.0–15.0)
Immature Granulocytes: 1 %
Lymphocytes Relative: 20 %
Lymphs Abs: 3.5 K/uL (ref 0.7–4.0)
MCH: 32.5 pg (ref 26.0–34.0)
MCHC: 34.7 g/dL (ref 30.0–36.0)
MCV: 93.8 fL (ref 80.0–100.0)
Monocytes Absolute: 1.3 K/uL — ABNORMAL HIGH (ref 0.1–1.0)
Monocytes Relative: 7 %
Neutro Abs: 12.4 K/uL — ABNORMAL HIGH (ref 1.7–7.7)
Neutrophils Relative %: 71 %
Platelets: 150 K/uL (ref 150–400)
RBC: 3.69 MIL/uL — ABNORMAL LOW (ref 3.87–5.11)
RDW: 23.9 % — ABNORMAL HIGH (ref 11.5–15.5)
WBC: 17.6 K/uL — ABNORMAL HIGH (ref 4.0–10.5)
nRBC: 0 % (ref 0.0–0.2)

## 2023-07-18 LAB — COMPREHENSIVE METABOLIC PANEL WITH GFR
ALT: 137 U/L — ABNORMAL HIGH (ref 0–44)
AST: 124 U/L — ABNORMAL HIGH (ref 15–41)
Albumin: 1.6 g/dL — ABNORMAL LOW (ref 3.5–5.0)
Alkaline Phosphatase: 172 U/L — ABNORMAL HIGH (ref 38–126)
Anion gap: 8 (ref 5–15)
BUN: 15 mg/dL (ref 8–23)
CO2: 17 mmol/L — ABNORMAL LOW (ref 22–32)
Calcium: 8.4 mg/dL — ABNORMAL LOW (ref 8.9–10.3)
Chloride: 112 mmol/L — ABNORMAL HIGH (ref 98–111)
Creatinine, Ser: 0.83 mg/dL (ref 0.44–1.00)
GFR, Estimated: >60 mL/min (ref >60)
Glucose, Bld: 141 mg/dL — ABNORMAL HIGH (ref 70–99)
Potassium: 3.9 mmol/L (ref 3.5–5.1)
Sodium: 137 mmol/L (ref 135–145)
Total Bilirubin: 2 mg/dL — ABNORMAL HIGH (ref 0.0–1.2)
Total Protein: 4.2 g/dL — ABNORMAL LOW (ref 6.5–8.1)

## 2023-07-18 LAB — GLUCOSE, CAPILLARY: Glucose-Capillary: 132 mg/dL — ABNORMAL HIGH (ref 70–99)

## 2023-07-18 MED ORDER — POTASSIUM CHLORIDE CRYS ER 20 MEQ PO TBCR
40.0000 meq | EXTENDED_RELEASE_TABLET | Freq: Every day | ORAL | 0 refills | Status: DC | PRN
  Filled 2023-07-18: qty 10, 5d supply, fill #0

## 2023-07-18 MED ORDER — ALUM & MAG HYDROXIDE-SIMETH 400-400-40 MG/5ML PO SUSP
30.0000 mL | ORAL | 0 refills | Status: DC | PRN
  Filled 2023-07-18: qty 180, 1d supply, fill #0

## 2023-07-18 MED ORDER — METFORMIN HCL 500 MG PO TABS
500.0000 mg | ORAL_TABLET | Freq: Two times a day (BID) | ORAL | 0 refills | Status: DC
  Filled 2023-07-18: qty 60, 30d supply, fill #0

## 2023-07-18 MED ORDER — ASCORBIC ACID 1000 MG PO TABS
1000.0000 mg | ORAL_TABLET | Freq: Every day | ORAL | 0 refills | Status: AC
Start: 2023-07-19 — End: ?
  Filled 2023-07-18: qty 100, 100d supply, fill #0

## 2023-07-18 MED ORDER — PREDNISONE 5 MG PO TABS
ORAL_TABLET | ORAL | 0 refills | Status: AC
  Filled 2023-07-18: qty 154, 43d supply, fill #0

## 2023-07-18 MED ORDER — ASPIRIN 81 MG PO TBEC
81.0000 mg | DELAYED_RELEASE_TABLET | Freq: Every day | ORAL | 12 refills | Status: AC
  Filled 2023-07-18: qty 30, 30d supply, fill #0

## 2023-07-18 MED ORDER — PANTOPRAZOLE SODIUM 40 MG PO TBEC
40.0000 mg | DELAYED_RELEASE_TABLET | Freq: Every day | ORAL | 0 refills | Status: DC
  Filled 2023-07-18: qty 30, 30d supply, fill #0

## 2023-07-18 MED ORDER — FUROSEMIDE 40 MG PO TABS
40.0000 mg | ORAL_TABLET | Freq: Every day | ORAL | 0 refills | Status: DC | PRN
  Filled 2023-07-18: qty 30, 30d supply, fill #0

## 2023-07-18 NOTE — Progress Notes (Signed)
 Inpatient Rehabilitation Discharge Medication Review by a Pharmacist   A complete drug regimen review was completed for this patient to identify any potential clinically significant medication issues.  High Risk Drug Classes Is patient taking? Indication by Medication  Antipsychotic No   Anticoagulant No   Antibiotic No   Opioid No   Antiplatelet Yes Aspirin - h/o CVA  Hypoglycemics/insulin  Yes Metformin - DM  Vasoactive Medication Yes Furosemide - edema   Chemotherapy No   Other Yes Cosopt /Xalatan - glaucoma   Potassium, Calcium  carbonate, Vitamin C  and D -supplements Miralax  - constipation Prednisone - autoimmune hepatitis  Protonix - reflux     Type of Medication Issue Identified Description of Issue Recommendation(s)  Drug Interaction(s) (clinically significant)     Duplicate Therapy     Allergy     No Medication Administration End Date  Continue Prednisone  taper at discharge-  tapering down by 5 mg per week  End date 08/30/2023  Incorrect Dose     Additional Drug Therapy Needed     Significant med changes from prior encounter (inform family/care partners about these prior to discharge). PTA meds paused- amlodipine , atorvastatin , valsartan , finasteride, and ezetimibe . Communicate relevant medication changes to patient/family members at discharge from CIR.     Other      Clinically significant medication issues were identified that warrant physician communication and completion of prescribed/recommended actions by midnight of the next day:  No   Time spent performing this drug regimen review (minutes): 20   Levorn Gaskins, [place] Clinical Pharmacist 07/18/2023 9:14 AM

## 2023-07-18 NOTE — Progress Notes (Signed)
 PROGRESS NOTE   Subjective/Complaints:  No new medical complaints or concerns.  Family in the room with her today.  She is scheduled for discharge today.  ROS:    Pt denies chills, headaches, SOB, abd pain, CP, N/V/C/D, and vision changes   Per HPI   Objective:   No results found. Recent Labs    07/18/23 0509  WBC 17.6*  HGB 12.0  HCT 34.6*  PLT 150     Recent Labs    07/18/23 0509  NA 137  K 3.9  CL 112*  CO2 17*  GLUCOSE 141*  BUN 15  CREATININE 0.83  CALCIUM  8.4*    Intake/Output Summary (Last 24 hours) at 07/18/2023 1625 Last data filed at 07/18/2023 0854 Gross per 24 hour  Intake 712 ml  Output --  Net 712 ml        Physical Exam: Vital Signs Blood pressure (!) 141/62, pulse 68, temperature 98.2 F (36.8 C), temperature source Oral, resp. rate 18, height 5' 3.5 (1.613 m), weight 79.5 kg, SpO2 100%.    General: awake, alert, appropriate, sitting on bedside chair, no acute distress HENT: conjugate gaze; oropharynx moist CV: regular rate; no JVD Pulmonary: CTA B/L; nonlabored breathing GI: soft, NT, ND, (+)BS Psychiatric: appropriate- quiet Neurological: Ox3, moving all 4 extremities to gravity, slightly delayed responses  Prior Exam Extremities;  Mild TTP on arch of R foot and medial ankle- no change in swelling- 2+ LE edema to mid calves B/L LUE- numb in lateral 4th digit and 5th digits-  Extremities: 2+  LE edema to ankles B/L Skin- more shiny-  to swollen feet/ankles- appears like mole/skin lesions scraped and had been bleeding- is not now- looks better MSK: 7/7- B/L LE's - HF 3/5 B/L- otherwise 4+/5 in LE's.  Musculoskeletal:     Cervical back: Normal range of motion and neck supple.     Comments: Ue's- Deltoids 4-/5 otherwise 5-/5 throughout in Ue's LE's- RLE- HF 2+/5; 4+/5 otherwise LLE- HF 2-/5 KE/KF/DF/PF all 4+/5 otherwise, stable 7/6   Skin:    General: Skin is warm  and dry.     Capillary Refill: Capillary refill takes less than 2 seconds.     Coloration: Skin is pale.     Comments: No skin breakdown seen  Neurological:     Mental Status: She is alert.     Sensory: No sensory deficit.     Comments: Intact to light touch in all 4 extremities Voice OK, but sounds somewhat weak Ox3 and appropriate- but slightly delayed responses Assessment/Plan: 1. Functional deficits which require 3+ hours per day of interdisciplinary therapy in a comprehensive inpatient rehab setting. Physiatrist is providing close team supervision and 24 hour management of active medical problems listed below. Physiatrist and rehab team continue to assess barriers to discharge/monitor patient progress toward functional and medical goals  Care Tool:  Bathing    Body parts bathed by patient: Right arm, Left arm, Abdomen, Chest, Front perineal area, Buttocks, Right upper leg, Left upper leg, Right lower leg, Left lower leg, Face         Bathing assist Assist Level: Independent with assistive device     Upper  Body Dressing/Undressing Upper body dressing   What is the patient wearing?: Pull over shirt    Upper body assist Assist Level: Independent with assistive device    Lower Body Dressing/Undressing Lower body dressing      What is the patient wearing?: Underwear/pull up, Pants     Lower body assist Assist for lower body dressing: Independent with assitive device     Toileting Toileting    Toileting assist Assist for toileting: Independent with assistive device     Transfers Chair/bed transfer  Transfers assist     Chair/bed transfer assist level: Independent with assistive device     Locomotion Ambulation   Ambulation assist      Assist level: Independent with assistive device Assistive device: Rollator Max distance: 300   Walk 10 feet activity   Assist     Assist level: Independent with assistive device Assistive device: Rollator    Walk 50 feet activity   Assist    Assist level: Independent with assistive device Assistive device: Rollator    Walk 150 feet activity   Assist    Assist level: Independent with assistive device Assistive device: Rollator    Walk 10 feet on uneven surface  activity   Assist     Assist level: Supervision/Verbal cueing Assistive device: Rollator   Wheelchair     Assist Is the patient using a wheelchair?: No Type of Wheelchair: Manual    Wheelchair assist level: Dependent - Patient 0% Max wheelchair distance: 150'    Wheelchair 50 feet with 2 turns activity    Assist        Assist Level: Dependent - Patient 0%   Wheelchair 150 feet activity     Assist      Assist Level: Dependent - Patient 0%   Blood pressure (!) 141/62, pulse 68, temperature 98.2 F (36.8 C), temperature source Oral, resp. rate 18, height 5' 3.5 (1.613 m), weight 79.5 kg, SpO2 100%.  Medical Problem List and Plan: 1. 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  Family asked for options to help prevent reoccurrence of illness.  Discussed healthy diet and exercise, seeking medical attention for fevers, chills, hypotension, dysuria, worsening abdominal pain, new motor or sensory changes, or other concerning symptoms. 2.  Antithrombotics: -DVT/anticoagulation:  Mechanical: Sequential compression devices, below knee Bilateral lower extremities             -antiplatelet therapy: resume ASA 81 mg 7/4 3. Pain Management: No meds ordered- pt denies pain- cannot have tylenol  or NSAIDs due to LFT  elevation/autoimmune hepatitis   7/12- will add Voltaren  gel QID prn for R foot pain  7/13- no complaints this AM 4. Mood/Behavior/Sleep: LCSW to follow for evaluation and support when available.              -antipsychotic agents: N/A 5. Neuropsych/cognition: This patient is capable of making decisions on her own behalf. 6. Skin/Wound Care: Routine pressure relief measures  7.  Fluids/Electrolytes/Nutrition: CMET in a.m   -Daily weights- Strict I&O              -Renal w/ 1200 fluid restrict              -Severe protien calorie malnutrition: add Glucerna - Alb 1.6- concern that it's due to her hepatitis- will monitor -recheck next Monday             -Vitamin C             7/4- changed Fluid restriction to 1800cc  7/8- Albumin up to 1.8  -spoke to Renal- they recommended stopping fluid restriction- and stop Lasix - use only as needed-for edema-  since doesn't have CHF- also put on 2G sodium diet per renal-   7/11- Alb 1.7- likely cause of LE edema- which is worse since stopped Lasix , but Lasix  can affect renal function, so don't want to keep her on it- also husband to bring in [employer] supplements to help with low albumin  7/14 albumin remains low at 1.6 8. Hypertension: holding home amlodipine   -continue Lasix  40 mg P.O twice daily              - CTM BPs closely  7/8- BP controlled- taking off Lasix  but that rarely affects BP significantly- con't to monitor 9. Hypokalemia: K+ now 3.3 Goal K+ > 4.0 and Mag > 2.0.  -continue potassium 20 meq daily              -will recheck levels in AM  7/4- K+ 3.5 this AM- might need to increase to 40 mEq  7/7- K+ up to 3.8 this AM  7/8- stopped Lasix  so stopped Kcl  7/10- K+ up to 4.2- doing better  7/11- K+- 3.5- due to Lasix  yesterday-will give Kcl 40 meq daily prn if gives Lasix   7/14 potassium stable at 3.9 10. Transaminitis/Elevated Liver Enzyme due to Autoimmune hepatitis/pancreatic cyst/dilated bile duct: Monitor Hepatic panel + CBC w/diff -Repeat  LFT's on 7/7 and 7/10 per GI  -Prednisone  40 mg started 07/06/23- Taper by 5 mg/week until done.              TMPT-pending may need to start Thiopurine if normal  -will need ASAP f/u with GI and hepatology after d/c- GI will attempt to work up pancreatic cyst and bile duct dilation while here.  7/9- will recheck LFTs in AM to monitor-  7/10- LFTs down to 203/192 so still improving 7/14 LFTs appear to be improved from prior, discussed GI follow-up 11. Hyperlipidemia: continue holding atorvastatin  due to abnormal LFTs.  12. Glaucoma: continue Dorzolamide -Timolol  and Latanoprost    13. Pre-Diabetes: A1c 6.2. now on prednisone               -monitor BS ac/hs and use SSI for elevated BS due to high doses of Prednisone   7/4- BG dropped to 69 overnight- but will order a night snack to help  7/7- Cbgs 96-166-- con't regimen  7/8- Cbgs 78 to 200- will not be able to start metformin  due to Cr, however will add nighttime snack- also d/w pt so she can ask for it  7/9- got snack last night- BG was 115 this AM as a result- spiked to 203 at 4pm yesterday but was actually low in AM- less than 100 when had blurry vision- will monitor if there's a trend with blurry vision and BG's  7/10- CBGs 76 to 188 in last 24 hours- getting night snacks- since titrating down on Prednisone , hopefully  will improve-   7/11- Cbgs still elevated- will try Metformin  500 mg BID for hyperglycemia- and stop when pt's Prednisone  gets to 20 mg- her BG's should be well enough controlled by then  7/12- given Metformin  last night- hard to tell if made difference yet. But last night was 168- and didn't spike to 190's  -7/14 CBGs controlled overall, follow-up with PCP recommended.  Likely will improve as steroids are tapered  CBG (last 3)  Recent Labs    07/17/23 1114 07/17/23 1627 07/18/23 0531  GLUCAP 95 168* 132*    14.  Class I obesity: Counsel on diet modifications and exercise to facilitate weight loss.  15. Leukocytosis- just  started Prednisone  today- I'm not sure would cause WBC to go to 19k from 10.9k so fast- will recheck in AM and monitor clinically for possible infection  7/4- WBC 19.3- since not still going up, I think more likely from Prednisone - just started after 1 dose of Prednisone -   7/8-7/9 down to 17.1k- will con't to monitor- recheck Thursday- is afebrile  7/10- labs pending- called nurse to look for the labs for me- 7/11- WBC down to 16k- so improving with reduction in Prednisone - so unlikely due to illness  7/14 WBC remains elevated likely due to prednisone  at 17.6 16. Thrombocytopenia- Plts dropped to 107k today from 155k- will recheck in AM- if better, will check Monday/Thursdays- to reduce lab draws  7/4- Plt sup to 157k  7/7- Plts 200k  7/11- Pl;ts 170k  17. ARF s/p E Coli- with improving renal function  7/4- Cr down to 1.57 and BUN slightly up to 41 (not drinking well)  -encouraged pt to drink more- also will make fluid restriction 1800cc now  7/7- Cr down to 1.42  from 1.57 and BUN 33 form 41  7/8- stopped fluid restriction and  Lasix  40 mg BID per d/w renal- put on 2G sodium diet  7/10- Cr down to normal- 0.96 and BUN 23- doing SO much better  7/11- K+ 3.5 and BUN 21 and Cr 0.91- doing better 18. Lower extremity edema: worse as per patient, additional Lasix  20mg  administered 7/5, discussed with patient and she feels edema improved 7/6 and does not want additional lasix   7/7- Will add TEDS due to Sx's of lightheadedness- just in case- no documentation of low BP.   7/9- Not wearing TEDS yet- d/w pt, needs to have them put on- due to LE edema which is 1-2+ this AM- likley dfue to stopping Lasix - but don't see that Lasix  dose needed for mild edema- will monitor  7/10- Wearing TEDs daily- will give 1 dose Lasix  40 mg x1 today- wait on more KCL since K+ 4.2- and monitor- since ankles 2+ edema  7/11- didn't help much- likely due to Alb of 1.7- which is causing fluid to stay in tissues, not in  vessels- so Lasix  not as effective- will try [employer] to help raise her albumin- since has N/V with Glucerna and other supplements- will also have husband bring in [place] which works for her.  19. LUE ulnar neuropathy Sx's  7/10- likley due ot compression overnight or with w/c- will ask therapy to get elbow protector for pt.   7/11- no numbness/tingling this AM 20. Anemia  7/11- Pt's has drop in Hb to 11.6- which is unusual- having no bleeding that she's aware of anywhere including stools, but LBM 2 days ago- verified Protonix - and saw wasn't on- so started immediately.   7/14 hemoglobin stable 12.0  The patient is medically ready for discharge to home and will not need follow-up with [facility] PM&R. In addition, they will need to follow up with their PCP, GI.     LOS: 11 days A FACE TO FACE EVALUATION WAS PERFORMED  Murray Collier 07/18/2023, 4:25 PM

## 2023-07-18 NOTE — Progress Notes (Signed)
 Inpatient Rehabilitation Care Coordinator Discharge Note   Patient Details  Name: Marissa Orozco MRN: [id number] Date of Birth: 1945/09/04   Discharge location: HOME WITH HUSBAND WHO IS ABLE TO ASSIST IF NEEDED  Length of Stay: 11 DAYS  Discharge activity level: MOD/I LEVEL  Home/community participation: ACTIVE  Patient response un:[place] Literacy - How often do you need to have someone help you when you read instructions, pamphlets, or other written material from your doctor or pharmacy?: Never  Patient response un:Dnrpjo Isolation - How often do you feel lonely or isolated from those around you?: Never  Services provided included: MD, RD, PT, OT, RN, CM, TR, Pharmacy, Neuropsych, SW  Financial Services:  Field seismologist Utilized: Marissa Orozco offered to/list presented to: PT  Follow-up services arranged:  Home Health, DME, Patient/Family has no preference for HH/DME agencies Home Health Agency: [facility]   PT  & OT    DME : [facility]    Patient response to transportation need: Is the patient able to respond to transportation needs?: Yes In the past 12 months, has lack of transportation kept you from medical appointments or from getting medications?: No In the past 12 months, has lack of transportation kept you from meetings, work, or from getting things needed for daily living?: No   Patient/Family verbalized understanding of follow-up arrangements:  Yes  Individual responsible for coordination of the follow-up plan: SELF [phone]  Confirmed correct DME delivered: Marissa Orozco MATSU 07/18/2023    Comments (or additional information):PT DID WELL AND MADE GOOD PROGRESS, HUSBAND IS AVAILABLE TO ASSIST IF NEEDED. PT HAPPY ABOUT DISCHARGE AND READY TO GO HOME  Summary of Stay    Date/Time Discharge Planning CSW  07/12/23 0913 HOme with husband who is in good health and can assist her if needed. Awaiting team's recommendations for  follow up and DME Marissa Orozco       Marissa Orozco G

## 2023-07-18 NOTE — Discharge Summary (Signed)
 Physician Discharge Summary  Patient ID: Marissa Orozco MRN: [id number] DOB/AGE: 79-May-1947 79 y.o.  Admit date: 07/07/2023 Discharge date: 07/18/2023  Discharge Diagnoses:  Principal Problem:   Intensive care (ICU) myopathy Active Problems:   AKI (acute kidney injury) (HCC) Hypertension Hypokalemia Autoimmune hepatitis Transaminitis Hyperlipidemia Glaucoma Prediabetes Class I obesity Leukocytosis Thrombocytopenia Anemia   Discharged Condition: stable  Significant Diagnostic Studies: US  BIOPSY (LIVER) Result Date: 07/04/2023 INDICATION: Elevated LFTs.  Request for random liver biopsy. EXAM: ULTRASOUND-GUIDED LIVER BIOPSY MEDICATIONS: Moderate sedation ANESTHESIA/SEDATION: Moderate (conscious) sedation was employed during this procedure. A total of Versed  1 mg and Fentanyl  75 mcg was administered intravenously by the radiology nurse. Total intra-service moderate Sedation Time: 20 minutes. The patient's level of consciousness and vital signs were monitored continuously by radiology nursing throughout the procedure under my direct supervision. FLUOROSCOPY TIME:  None COMPLICATIONS: None immediate. PROCEDURE: Informed written consent was obtained from the patient after a thorough discussion of the procedural risks, benefits and alternatives. All questions were addressed. A timeout was performed prior to the initiation of the procedure. Abdomen was evaluated with ultrasound. The right hepatic lobe was identified. The right side of the abdomen was prepped with chlorhexidine  and sterile field was created. Skin was anesthetized using 1% lidocaine . Small incision was made. Using ultrasound guidance, 17 gauge coaxial needle was directed into the right hepatic lobe. Total of 3 core biopsies were performed with an 18 gauge core device. Three adequate specimens obtained and placed in formalin. Gel-Foam slurry was injected as the 17 gauge needle was removed. Bandage placed over the puncture site.  FINDINGS: Core biopsies obtained from the right hepatic lobe. No immediate bleeding or hematoma formation. IMPRESSION: Successful ultrasound-guided core biopsies from the right hepatic lobe. Electronically Signed   By: Juliene Balder M.D.   On: 07/04/2023 16:25   IR Removal Tun Cv Cath W/O FL Result Date: 07/04/2023 INDICATION: 78 year old female with history of AKI requiring dialysis, status post tunneled dialysis catheter placement 06/27/2023 presents with recovery of renal function. Request for tunneled dialysis catheter removal. EXAM: REMOVAL OF TUNNELED HEMODIALYSIS CATHETER MEDICATIONS: None COMPLICATIONS: None immediate. PROCEDURE: Informed written consent was obtained from the patient following an explanation of the procedure, risks, benefits and alternatives to treatment. A time out was performed prior to the initiation of the procedure. Sterile technique was utilized including mask, sterile gloves, sterile drape, and hand hygiene. ChloraPrep was used to prep the patient's right neck, chest and existing catheter. 1% lidocaine  was injected around the catheter and the subcutaneous tunnel. The catheter was removed intact by applying gentle manual retraction. Hemostasis was obtained with manual compression. A dressing was placed. The patient tolerated the procedure well without immediate post procedural complication. IMPRESSION: Successful removal of tunneled dialysis catheter. Performed by: Aimee Han, PA-C Electronically Signed   By: Marcey Moan M.D.   On: 07/04/2023 09:00   MR ABDOMEN MRCP WO CONTRAST Result Date: 07/02/2023 CLINICAL DATA:  Jaundice. EXAM: MRI ABDOMEN WITHOUT CONTRAST  (INCLUDING MRCP) TECHNIQUE: Multiplanar multisequence MR imaging of the abdomen was performed. Heavily T2-weighted images of the biliary and pancreatic ducts were obtained, and three-dimensional MRCP images were rendered by post processing. COMPARISON:  Ultrasound 06/29/2023 FINDINGS: Lower chest: Small bilateral pleural  effusions. Hepatobiliary: There is abnormal heterogeneous appearance of the liver with multifocal scattered areas of relative increased T2 signal throughout both lobes of liver with increased T2 periportal signal. The gallbladder appears within normal limits. Fusiform dilatation of the common bile duct measures up to 1 cm.  There is moderate intrahepatic dilatation to the left lobe. Mild right lobe of liver dilatation. No signs of choledocholithiasis. Pancreas: The main pancreatic duct is prominent measuring up to 4 mm. Multiple scattered areas of side branch duct ectasia identified. Multiple larger cysts are also identified predominantly within the head of pancreas. The dominant cyst is centered around the uncinate process measuring 5.2 x 4.6 cm, image 19/5. No signs of pancreatic inflammation. Spleen: Within normal limits in size and appearance. Normal adrenal glands. Adrenals/Urinary Tract: Normal adrenal glands. No hydronephrosis. Lesion off the anterior cortex of the upper pole of the right kidney measures 0.9 cm, image 36/8. This is isointense on the coronal T2 haste. On the T2 fat sat sequences there is decreased signal within this lesion. Similarly on the out of phase sequences there is relative decreased signal within this lesion. On the unenhanced pre contrast T1 weighted sequence this is relatively hypointense to the adjacent renal parenchyma. Stomach/Bowel: Visualized portions within the abdomen are unremarkable. Vascular/Lymphatic: Normal caliber of the abdominal aorta. Porta hepatic lymph node measures 1.3 cm, image 18/3. No retroperitoneal adenopathy. Other:  No ascites.  There is edema extending along both flanks. Musculoskeletal: No suspicious bone lesions identified. IMPRESSION: 1. Abnormal heterogeneous appearance of the liver with multifocal scattered areas of relative increased T2 signal throughout both lobes of liver with increased T2 periportal signal. Imaging findings are nonspecific likely  reflect hepatic inflammation, edema, early infiltrative disease or evolving hepatocellular injury. Underlying infiltrative tumor would be difficult to exclude without the benefit of IV contrast material. 2. Fusiform dilatation of the common bile duct measures up to 1 cm. There is moderate intrahepatic dilatation to the left lobe. Mild right lobe of liver biliary dilatation. No signs of choledocholithiasis. 3. Numerous cysts identified within the dominant cyst is centered around the uncinate process measuring 5.2 x 4.6 cm. Findings may reflect mucinous cystic neoplasm versus side branch IPMN. Recommend referral to gastroenterology for further management. 4. Enlarged porta hepatic lymph node measures 1.3 cm. 5. Small bilateral pleural effusions. 6. Lesion off the anterior cortex of the upper pole of the right kidney measures 0.9 cm. 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 Electronically Signed   By: Waddell Calk M.D.   On: 07/02/2023 05:59   MR 3D Recon At Scanner Result Date: 07/02/2023 CLINICAL DATA:  Jaundice. EXAM: MRI ABDOMEN WITHOUT CONTRAST  (INCLUDING MRCP) TECHNIQUE: Multiplanar multisequence MR imaging of the abdomen was performed. Heavily T2-weighted images of the biliary and pancreatic ducts were obtained, and three-dimensional MRCP images were rendered by post processing. COMPARISON:  Ultrasound 06/29/2023 FINDINGS: Lower chest: Small bilateral pleural effusions. Hepatobiliary: There is abnormal heterogeneous appearance of the liver with multifocal scattered areas of relative increased T2 signal throughout both lobes of liver with increased T2 periportal signal. The gallbladder appears within normal limits. Fusiform dilatation of the common bile duct measures up to 1 cm. There is moderate intrahepatic dilatation to the left lobe. Mild right lobe of liver dilatation. No signs of choledocholithiasis. Pancreas: The main  pancreatic duct is prominent measuring up to 4 mm. Multiple scattered areas of side branch duct ectasia identified. Multiple larger cysts are also identified predominantly within the head of pancreas. The dominant cyst is centered around the uncinate process measuring 5.2 x 4.6 cm, image 19/5. No signs of pancreatic inflammation. Spleen: Within normal limits in size and appearance. Normal adrenal glands. Adrenals/Urinary Tract: Normal adrenal glands. No hydronephrosis. Lesion off the  anterior cortex of the upper pole of the right kidney measures 0.9 cm, image 36/8. This is isointense on the coronal T2 haste. On the T2 fat sat sequences there is decreased signal within this lesion. Similarly on the out of phase sequences there is relative decreased signal within this lesion. On the unenhanced pre contrast T1 weighted sequence this is relatively hypointense to the adjacent renal parenchyma. Stomach/Bowel: Visualized portions within the abdomen are unremarkable. Vascular/Lymphatic: Normal caliber of the abdominal aorta. Porta hepatic lymph node measures 1.3 cm, image 18/3. No retroperitoneal adenopathy. Other:  No ascites.  There is edema extending along both flanks. Musculoskeletal: No suspicious bone lesions identified. IMPRESSION: 1. Abnormal heterogeneous appearance of the liver with multifocal scattered areas of relative increased T2 signal throughout both lobes of liver with increased T2 periportal signal. Imaging findings are nonspecific likely reflect hepatic inflammation, edema, early infiltrative disease or evolving hepatocellular injury. Underlying infiltrative tumor would be difficult to exclude without the benefit of IV contrast material. 2. Fusiform dilatation of the common bile duct measures up to 1 cm. There is moderate intrahepatic dilatation to the left lobe. Mild right lobe of liver biliary dilatation. No signs of choledocholithiasis. 3. Numerous cysts identified within the dominant cyst is centered  around the uncinate process measuring 5.2 x 4.6 cm. Findings may reflect mucinous cystic neoplasm versus side branch IPMN. Recommend referral to gastroenterology for further management. 4. Enlarged porta hepatic lymph node measures 1.3 cm. 5. Small bilateral pleural effusions. 6. Lesion off the anterior cortex of the upper pole of the right kidney measures 0.9 cm. 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 Electronically Signed   By: Waddell Calk M.D.   On: 07/02/2023 05:59   US  Abdomen Limited RUQ (LIVER/GB) Result Date: 06/29/2023 CLINICAL DATA:  Abnormal LFTs EXAM: ULTRASOUND ABDOMEN LIMITED RIGHT UPPER QUADRANT COMPARISON:  None Available. FINDINGS: Gallbladder: No gallstones or wall thickening visualized. No sonographic Murphy sign noted by sonographer. Common bile duct: Diameter: 5.5 mm Liver: Mildly increased in echogenicity consistent with fatty infiltration. No focal mass is seen. Portal vein is patent on color Doppler imaging with normal direction of blood flow towards the liver. Other: None. IMPRESSION: Fatty liver.  No other focal abnormality is seen. Electronically Signed   By: Oneil Devonshire M.D.   On: 06/29/2023 20:29   IR Fluoro Guide CV Line Right Result Date: 06/27/2023 CLINICAL DATA:  Renal insufficiency, needs access for hemodialysis EXAM: TUNNELED HEMODIALYSIS CATHETER PLACEMENT WITH ULTRASOUND AND FLUOROSCOPIC GUIDANCE TECHNIQUE: The procedure, risks, benefits, and alternatives were explained to the patient. Questions regarding the procedure were encouraged and answered. The patient understands and consents to the procedure. As antibiotic prophylaxis, cefazolin  2 g was ordered pre-procedure and administered intravenously within one hour of incision.Patency of the right IJ vein was confirmed with ultrasound with image documentation. An appropriate skin site was determined. Region was prepped using maximum barrier  technique including cap and mask, sterile gown, sterile gloves, large sterile sheet, and Chlorhexidine  as cutaneous antisepsis. The region was infiltrated locally with 1% lidocaine . Intravenous Fentanyl  50mcg and Versed  1mg  were administered by RN during a total moderate (conscious) sedation time of 12 minutes; the patient's level of consciousness and physiological / cardiorespiratory status were monitored continuously by radiology RN under my direct supervision. Under real-time ultrasound guidance, the right IJ vein was accessed with a 21 gauge micropuncture needle; the needle tip within the vein was confirmed with ultrasound image documentation. Needle  exchanged over the 018 guidewire for transitional dilator, which allowed advancement of a Benson wire into the IVC. Over this, an MPA catheter was advanced. A Palindrome 19 hemodialysis catheter was tunneled from the right anterior chest wall approach to the right IJ dermatotomy site. The MPA catheter was exchanged over an Amplatz wire for serial vascular dilators which allow placement of a peel-away sheath, through which the catheter was advanced under intermittent fluoroscopy, positioned with its tips in the proximal and midright atrium. Spot chest radiograph confirms good catheter position. No pneumothorax. Catheter was flushed and primed per protocol. Catheter secured externally with O Prolene sutures. The right IJ dermatotomy site was closed with Dermabond. COMPLICATIONS: COMPLICATIONS None immediate FLUOROSCOPY: Radiation Exposure Index (as provided by the fluoroscopic device): 6.7 mGy air Kerma COMPARISON:  None Available. IMPRESSION: 1. Technically successful placement of tunneled right IJ hemodialysis catheter with ultrasound and fluoroscopic guidance. Ready for routine use. ACCESS: Remains approachable for percutaneous intervention as needed. Electronically Signed   By: JONETTA Faes M.D.   On: 06/27/2023 15:05   IR US  Guide Vasc Access Right Result Date:  06/27/2023 CLINICAL DATA:  Renal insufficiency, needs access for hemodialysis EXAM: TUNNELED HEMODIALYSIS CATHETER PLACEMENT WITH ULTRASOUND AND FLUOROSCOPIC GUIDANCE TECHNIQUE: The procedure, risks, benefits, and alternatives were explained to the patient. Questions regarding the procedure were encouraged and answered. The patient understands and consents to the procedure. As antibiotic prophylaxis, cefazolin  2 g was ordered pre-procedure and administered intravenously within one hour of incision.Patency of the right IJ vein was confirmed with ultrasound with image documentation. An appropriate skin site was determined. Region was prepped using maximum barrier technique including cap and mask, sterile gown, sterile gloves, large sterile sheet, and Chlorhexidine  as cutaneous antisepsis. The region was infiltrated locally with 1% lidocaine . Intravenous Fentanyl  50mcg and Versed  1mg  were administered by RN during a total moderate (conscious) sedation time of 12 minutes; the patient's level of consciousness and physiological / cardiorespiratory status were monitored continuously by radiology RN under my direct supervision. Under real-time ultrasound guidance, the right IJ vein was accessed with a 21 gauge micropuncture needle; the needle tip within the vein was confirmed with ultrasound image documentation. Needle exchanged over the 018 guidewire for transitional dilator, which allowed advancement of a Benson wire into the IVC. Over this, an MPA catheter was advanced. A Palindrome 19 hemodialysis catheter was tunneled from the right anterior chest wall approach to the right IJ dermatotomy site. The MPA catheter was exchanged over an Amplatz wire for serial vascular dilators which allow placement of a peel-away sheath, through which the catheter was advanced under intermittent fluoroscopy, positioned with its tips in the proximal and midright atrium. Spot chest radiograph confirms good catheter position. No  pneumothorax. Catheter was flushed and primed per protocol. Catheter secured externally with O Prolene sutures. The right IJ dermatotomy site was closed with Dermabond. COMPLICATIONS: COMPLICATIONS None immediate FLUOROSCOPY: Radiation Exposure Index (as provided by the fluoroscopic device): 6.7 mGy air Kerma COMPARISON:  None Available. IMPRESSION: 1. Technically successful placement of tunneled right IJ hemodialysis catheter with ultrasound and fluoroscopic guidance. Ready for routine use. ACCESS: Remains approachable for percutaneous intervention as needed. Electronically Signed   By: JONETTA Faes M.D.   On: 06/27/2023 15:05   US  BIOPSY (KIDNEY) Result Date: 06/27/2023 INDICATION: 78 year old female with history of acute kidney injury. EXAM: ULTRASOUND GUIDED RENAL BIOPSY COMPARISON:  None Available. MEDICATIONS: None. ANESTHESIA/SEDATION: Fentanyl  25 mcg IV; Versed  2 mg IV Total Moderate Sedation time: 12 minutes; The patient  was continuously monitored during the procedure by the interventional radiology nurse under my direct supervision. COMPLICATIONS: None immediate. PROCEDURE: Informed written consent was obtained from the patient after a discussion of the risks, benefits and alternatives to treatment. The patient understands and consents the procedure. A timeout was performed prior to the initiation of the procedure. Ultrasound scanning was performed of the bilateral flanks. The inferior pole of the right kidney was selected for biopsy due to location and sonographic window. The procedure was planned. The operative site was prepped and draped in the usual sterile fashion. The overlying soft tissues were anesthetized with 1% lidocaine  with epinephrine. A 17 gauge coaxial introducer needle was advanced into the inferior cortex of the right kidney and 2 core biopsies were obtained under direct ultrasound guidance with an 18 gauge biopsy device. Images were saved for documentation purposes. The biopsy device  was removed and Gel-Foam slurry was injected under ultrasound visualization while removing the coaxial needle. Hemostasis was obtained with manual compression. Post procedural scanning was negative for significant post procedural hemorrhage or additional complication. A dressing was placed. The patient tolerated the procedure well without immediate post procedural complication. IMPRESSION: Technically successful ultrasound guided right renal biopsy. Ester Sides, MD [facility] Electronically Signed   By: Ester Sides M.D.   On: 06/27/2023 13:39    Labs:  Basic Metabolic Panel: Recent Labs  Lab 07/14/23 0614 07/15/23 0558 07/18/23 0509  NA 137 137 137  K 4.2 3.5 3.9  CL 109 109 112*  CO2 14* 20* 17*  GLUCOSE 84 82 141*  BUN 23 21 15   CREATININE 0.96 0.91 0.83  CALCIUM  8.2* 8.0* 8.4*    CBC: Recent Labs  Lab 07/14/23 1810 07/15/23 0558 07/18/23 0509  WBC 16.7* 16.0* 17.6*  NEUTROABS 13.4* 13.9* 12.4*  HGB 13.9 11.6* 12.0  HCT 39.8 33.4* 34.6*  MCV 91.3 91.8 93.8  PLT 211 170 150    CBG: Recent Labs  Lab 07/16/23 2018 07/17/23 0612 07/17/23 1114 07/17/23 1627 07/18/23 0531  GLUCAP 174* 96 95 168* 132*    Brief HPI:   DESIREY KEAHEY is a 78 y.o. female with past medical history significant for CVA, HTN, HLD, glaucoma, and history of kidney stones presented to [facility] on 06/17/2023 with complaints of low blood pressure, fatigue and poor oral intake.  Admission chemistry BUN 30 and cr 2.7.  UTI positive for E. coli was treated with antibiotics.  On 06/17/2023 she was admitted to the hospital for AKI she was treated with IVF nephrology consulted and patient was transferred to [facility] on 06/23/23/2 due to concerns for glomerulonephritis and rising BUN/CR.  IR was consulted and placed a right IJ TDC and she received 1 round of HD.  Renal biopsy revealed chronic changes, and an AV but no immune complex  mediated or crescentic glomerulonephritis noted.  Creatinine) dialysis catheter removed.  GI was consulted for significantly elevated LFTs.  Ultrasound of RUQ showed fatty liver without focal abnormality.  There is incidental findings of transaminitis/bilirubinemia and a pancreatic cyst, CA 19-9 139.  Liver biopsy consistent with autoimmune hepatitis without cirrhosis with chronic changes with acute inflammation.  Recommendations to start prednisone  taper with close monitoring of LFTs, trend IgG and outpatient referral to hepatology for EUS.   Per chart review patient was independent without assistive device, retired and drives.  She participates in aerobics 3 times weekly and lives in a two-level home.  Currently requiring min to  mod assist for mobility.  Min to max assist with ADLs and minimal assistance using RW. Therapy evaluations completed due to patient decreased functional mobility and was admitted for a comprehensive rehab program.  Hospital Course: HEVIN JEFFCOAT was admitted to rehab 07/07/2023 for inpatient therapies to consist of PT, ST and OT at least three hours five days a week. Past admission physiatrist, therapy team and rehab RN have worked together to provide customized collaborative inpatient rehab.  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 Pain in right foot was treated with applications of topical NSAID.  The patient had a history of glaucoma and was maintained on eye drops per the previous regimen. History of pre-diabetes with an A1C of 6.2. Diabetes has been monitored with ac/hs CBG checks and SSI was use prn for tighter BS control. Blood sugars were affected by high doses of prednisone , leading to increased blood sugars. Blood sugars did improve and were well controlled. Metformin  initiated. She will need to follow-up with primary care provider, as it was anticipated that conditions would improve with the  tapering of steroids. Diabetic teaching was completed. Leukocytosis was noted and felt to be increased due to prednisone . The patient experienced acute renal failure following an E. coli infection, but renal function improved with a downtrend in creatinine. Creatinine and BUN were monitored closely, along with initial fluid restrictions, which were later discontinued.  The patient experienced hypokalemia, which was replaced with potassium as needed, given the use of Lasix . BLE edema pt was encouraged to use TED hose along with elevating legs. Anemia with a hemoglobin level of 12.0 and no signs of active bleeding. The last bowel movement was normal. Protonix  was initiated.  The patient was classified as having Class 1 obesity and was counseled on diet modifications and exercise to facilitate weight loss.The patient had hypertension, for which blood pressures were monitored on TID basis and home amlodipine  was held. DVT prophylaxis with SCDs, antiplatelet aspirin . History of HDL, Atorvastatin  discontinued in the setting of abnormal LFTs.    Rehab course: During patient's stay in rehab weekly team conferences were held to monitor patient's progress, set goals and discuss barriers to discharge. At admission, patient required min to mod assist for mobility.  Min to max assist with ADLs and minimal assistance using RW.  She has had improvement in activity tolerance, balance, postural control as well as ability to compensate for deficits. Patient ambulated 154ft x 2 trials without AD to and from main therapy gym. Performed lateral stepping in hallway. She worked on standing mini squats with 4 lb medicine ball and theraband. Patient completed ADL tasks and tranfers at mod I with good safety awareness without requiring voice commands. Patient performed exercises with theraband.  LCSW presented at bedside at discharge to provide clarity for reccommended outpatient therapies and HH aid.  At discharge family teaching  completed with husband and daughters upon discharge to home.      Disposition: Discharge disposition: 01-Home or Self Care        Diet: Diabetic    Special Instructions:  -No driving smoking or alcohol or illicit drug use   Allergies as of 07/18/2023       Reactions   Hydrochlorothiazide Itching        Medication List     PAUSE taking these medications    amLODipine  5 MG tablet Wait to take this until your doctor or other care provider tells you to start again.  Commonly known as: NORVASC  TAKE 1 TABLET BY MOUTH DAILY What changed: when to take this   atorvastatin  80 MG tablet Wait to take this until your doctor or other care provider tells you to start again. Commonly known as: LIPITOR TAKE 1 TABLET BY MOUTH DAILY   ezetimibe  10 MG tablet Wait to take this until your doctor or other care provider tells you to start again. Commonly known as: ZETIA  Take 1 tablet (10 mg total) by mouth daily.   finasteride 5 MG tablet Wait to take this until your doctor or other care provider tells you to start again. Commonly known as: PROSCAR Take 5 mg by mouth daily.   valsartan  160 MG tablet Wait to take this until your doctor or other care provider tells you to start again. Commonly known as: Diovan  Take 1 tablet (160 mg total) by mouth daily.       TAKE these medications    aspirin  EC 81 MG tablet Take 1 tablet (81 mg total) by mouth daily. Swallow whole.   CALCIUM  500 PO Take 500 mg by mouth daily with breakfast.   dorzolamide -timolol  2-0.5 % ophthalmic solution Commonly known as: COSOPT  Place 1 drop into both eyes in the morning and at bedtime.   furosemide  40 MG tablet Commonly known as: LASIX  Take 1 tablet (40 mg total) by mouth daily as needed for edema (use if Pt asks for it for edema). What changed:  when to take this reasons to take this   latanoprost  0.005 % ophthalmic solution Commonly known as: XALATAN  Place 1 drop into both eyes at  bedtime.   metFORMIN  500 MG tablet Commonly known as: GLUCOPHAGE  Take 1 tablet (500 mg total) by mouth 2 (two) times daily with a meal.   Mintox Maximum Strength 400-400-40 MG/5ML suspension Generic drug: alum & mag hydroxide-simeth Take 15 mLs by mouth every 4 (four) hours as needed for indigestion.   pantoprazole  40 MG tablet Commonly known as: PROTONIX  Take 1 tablet (40 mg total) by mouth daily. Start taking on: July 19, 2023   polyethylene glycol 17 g packet Commonly known as: MIRALAX  / GLYCOLAX  Take 17 g by mouth daily as needed for mild constipation.   potassium chloride  SA 20 MEQ tablet Commonly known as: KLOR-CON  M Take 2 tablets (40 mEq total) by mouth daily as needed (give if gives Lasix  for LE edema).   predniSONE  5 MG tablet Commonly known as: DELTASONE  Take 7 tablets (35 mg total) by mouth daily with breakfast for 1 day, THEN 6 tablets (30 mg total) daily with breakfast for 7 days, THEN 5 tablets (25 mg total) daily with breakfast for 7 days, THEN 4 tablets (20 mg total) daily with breakfast for 7 days, THEN 3 tablets (15 mg total) daily with breakfast for 7 days, THEN 2 tablets (10 mg total) daily with breakfast for 7 days, THEN 1 tablet (5 mg total) daily with breakfast for 7 days. Start taking on: July 19, 2023 What changed:  medication strength See the new instructions.   vitamin C  1000 MG tablet Take 1 tablet (1,000 mg total) by mouth daily. Start taking on: July 19, 2023   Vitamin D3 50 MCG (2000 UT) Tabs Take 2,000 Units by mouth daily with breakfast.        Follow-up Information     Jarold Medici, MD Follow up.   Specialty: Internal Medicine Why: Call for an appointment. Contact information: [address] [place] [address] [phone]  Mansouraty, Aloha Raddle., MD Follow up.   Specialties: Gastroenterology, Internal Medicine Why: Office will call for appointment Contact information: [address] [place]  [address] [phone]                 Signed: Daphne LITTIE Finders 07/18/2023, 5:51 PM

## 2023-07-18 NOTE — Progress Notes (Signed)
 Discharge instructions provided by Daphne, NP to patient and family. Medications received from TOC. Patient safely transported off the unit; patient left via private car.   Geni Armor, LPN

## 2023-07-19 DIAGNOSIS — M810 Age-related osteoporosis without current pathological fracture: Secondary | ICD-10-CM | POA: Diagnosis not present

## 2023-07-19 DIAGNOSIS — K862 Cyst of pancreas: Secondary | ICD-10-CM | POA: Diagnosis not present

## 2023-07-19 DIAGNOSIS — Z7982 Long term (current) use of aspirin: Secondary | ICD-10-CM | POA: Diagnosis not present

## 2023-07-19 DIAGNOSIS — Z7984 Long term (current) use of oral hypoglycemic drugs: Secondary | ICD-10-CM | POA: Diagnosis not present

## 2023-07-19 DIAGNOSIS — E66811 Obesity, class 1: Secondary | ICD-10-CM | POA: Diagnosis not present

## 2023-07-19 DIAGNOSIS — K754 Autoimmune hepatitis: Secondary | ICD-10-CM | POA: Diagnosis not present

## 2023-07-19 DIAGNOSIS — Z6830 Body mass index (BMI) 30.0-30.9, adult: Secondary | ICD-10-CM | POA: Diagnosis not present

## 2023-07-19 DIAGNOSIS — G4730 Sleep apnea, unspecified: Secondary | ICD-10-CM | POA: Diagnosis not present

## 2023-07-19 DIAGNOSIS — I358 Other nonrheumatic aortic valve disorders: Secondary | ICD-10-CM | POA: Diagnosis not present

## 2023-07-19 DIAGNOSIS — D72829 Elevated white blood cell count, unspecified: Secondary | ICD-10-CM | POA: Diagnosis not present

## 2023-07-19 DIAGNOSIS — K760 Fatty (change of) liver, not elsewhere classified: Secondary | ICD-10-CM | POA: Diagnosis not present

## 2023-07-19 DIAGNOSIS — E785 Hyperlipidemia, unspecified: Secondary | ICD-10-CM | POA: Diagnosis not present

## 2023-07-19 DIAGNOSIS — D696 Thrombocytopenia, unspecified: Secondary | ICD-10-CM | POA: Diagnosis not present

## 2023-07-19 DIAGNOSIS — H409 Unspecified glaucoma: Secondary | ICD-10-CM | POA: Diagnosis not present

## 2023-07-19 DIAGNOSIS — G7281 Critical illness myopathy: Secondary | ICD-10-CM | POA: Diagnosis not present

## 2023-07-19 DIAGNOSIS — N179 Acute kidney failure, unspecified: Secondary | ICD-10-CM | POA: Diagnosis not present

## 2023-07-19 DIAGNOSIS — E876 Hypokalemia: Secondary | ICD-10-CM | POA: Diagnosis not present

## 2023-07-19 DIAGNOSIS — E2839 Other primary ovarian failure: Secondary | ICD-10-CM | POA: Diagnosis not present

## 2023-07-19 DIAGNOSIS — L309 Dermatitis, unspecified: Secondary | ICD-10-CM | POA: Diagnosis not present

## 2023-07-19 DIAGNOSIS — I6523 Occlusion and stenosis of bilateral carotid arteries: Secondary | ICD-10-CM | POA: Diagnosis not present

## 2023-07-19 DIAGNOSIS — D649 Anemia, unspecified: Secondary | ICD-10-CM | POA: Diagnosis not present

## 2023-07-19 DIAGNOSIS — R7303 Prediabetes: Secondary | ICD-10-CM | POA: Diagnosis not present

## 2023-07-19 DIAGNOSIS — K219 Gastro-esophageal reflux disease without esophagitis: Secondary | ICD-10-CM | POA: Diagnosis not present

## 2023-07-19 DIAGNOSIS — R1310 Dysphagia, unspecified: Secondary | ICD-10-CM | POA: Diagnosis not present

## 2023-07-19 DIAGNOSIS — I119 Hypertensive heart disease without heart failure: Secondary | ICD-10-CM | POA: Diagnosis not present

## 2023-07-21 DIAGNOSIS — K862 Cyst of pancreas: Secondary | ICD-10-CM | POA: Diagnosis not present

## 2023-07-21 DIAGNOSIS — K754 Autoimmune hepatitis: Secondary | ICD-10-CM | POA: Diagnosis not present

## 2023-07-21 DIAGNOSIS — D696 Thrombocytopenia, unspecified: Secondary | ICD-10-CM | POA: Diagnosis not present

## 2023-07-21 DIAGNOSIS — G7281 Critical illness myopathy: Secondary | ICD-10-CM | POA: Diagnosis not present

## 2023-07-21 DIAGNOSIS — I119 Hypertensive heart disease without heart failure: Secondary | ICD-10-CM | POA: Diagnosis not present

## 2023-07-21 DIAGNOSIS — N179 Acute kidney failure, unspecified: Secondary | ICD-10-CM | POA: Diagnosis not present

## 2023-07-22 ENCOUNTER — Telehealth: Payer: Self-pay

## 2023-07-22 NOTE — Transitions of Care (Post Inpatient/ED Visit) (Signed)
   07/22/2023  Name: Marissa Orozco MRN: [id number] DOB: 11-25-1945  Today's TOC FU Call Status: Today's TOC FU Call Status:: Unsuccessful Call (1st Attempt)  Attempted to reach the patient regarding the most recent Inpatient/ED visit.  Follow Up Plan: Additional outreach attempts will be made to reach the patient to complete the Transitions of Care (Post Inpatient/ED visit) call.   [facility] [place], CMA

## 2023-07-25 ENCOUNTER — Encounter: Payer: Self-pay | Admitting: Internal Medicine

## 2023-07-25 ENCOUNTER — Ambulatory Visit: Admitting: Internal Medicine

## 2023-07-25 VITALS — BP 128/70 | HR 63 | Temp 98.3°F | Ht 63.0 in | Wt 174.0 lb

## 2023-07-25 DIAGNOSIS — D72829 Elevated white blood cell count, unspecified: Secondary | ICD-10-CM

## 2023-07-25 DIAGNOSIS — I6523 Occlusion and stenosis of bilateral carotid arteries: Secondary | ICD-10-CM

## 2023-07-25 DIAGNOSIS — I119 Hypertensive heart disease without heart failure: Secondary | ICD-10-CM | POA: Diagnosis not present

## 2023-07-25 DIAGNOSIS — E6609 Other obesity due to excess calories: Secondary | ICD-10-CM | POA: Diagnosis not present

## 2023-07-25 DIAGNOSIS — Z6830 Body mass index (BMI) 30.0-30.9, adult: Secondary | ICD-10-CM

## 2023-07-25 DIAGNOSIS — E7800 Pure hypercholesterolemia, unspecified: Secondary | ICD-10-CM | POA: Diagnosis not present

## 2023-07-25 DIAGNOSIS — E66811 Obesity, class 1: Secondary | ICD-10-CM

## 2023-07-25 DIAGNOSIS — N179 Acute kidney failure, unspecified: Secondary | ICD-10-CM

## 2023-07-25 DIAGNOSIS — G7281 Critical illness myopathy: Secondary | ICD-10-CM

## 2023-07-25 DIAGNOSIS — R7303 Prediabetes: Secondary | ICD-10-CM | POA: Diagnosis not present

## 2023-07-25 DIAGNOSIS — K754 Autoimmune hepatitis: Secondary | ICD-10-CM

## 2023-07-25 DIAGNOSIS — R7309 Other abnormal glucose: Secondary | ICD-10-CM

## 2023-07-25 LAB — THIOPURINE METHYLTRANSFERASE (TPMT), RBC: TPMT Activity:: 27.3 U/mL{RBCs}

## 2023-07-25 NOTE — Progress Notes (Signed)
 I,Victoria T Emmitt, CMA,acting as a Neurosurgeon for Catheryn LOISE Slocumb, MD.,have documented all relevant documentation on the behalf of Catheryn LOISE Slocumb, MD,as directed by  Catheryn LOISE Slocumb, MD while in the presence of Catheryn LOISE Slocumb, MD.  Subjective:  Patient ID: Marissa Orozco , female    DOB: [birth year] , 78 y.o.   MRN: [id number]  Chief Complaint  Patient presents with   Hospitalization Follow-up    Patient presents today for hospital follow up. Accompanied by her husband. She went to [facility] on 07/07/23 & discharged on 07/17/23 for AKI. She states feeling okay today. Less winded than before.  She wants to know if its okay to eat a chicken sub with no salt. She has craved this for a while.  She also wants to know how to prevent this from happening again.     HPI Discussed the use of AI scribe software for clinical note transcription with the patient, who gave verbal consent to proceed.  History of Present Illness Marissa Orozco is a 78 year old female with autoimmune hepatitis and acute kidney injury who presents for follow-up after recent hospitalization and rehabilitation.  She is accompanied by her husband.  She was initially hospitalized on June 13th due to fatigue and hypotension. She had poor oral intake prior to admission and was found to have acute kidney injury. A renal workup, including dialysis and a renal biopsy, showed chronic changes but no immune complex. A liver biopsy on June 30th confirmed autoimmune hepatitis. She was discharged to rehabilitation on July 3rd and stayed until July 14th. She was discharged home in stable condition and with home PT.   During her hospitalization, she was treated with hydralazine and furosemide  for blood pressure management. Prednisone  was initiated for autoimmune hepatitis, and metformin  was started for prediabetes. An MRCP revealed dilatation of the common bile duct and a pancreatic issue, prompting a follow-up with GI for an endoscopic  ultrasound. She experienced a UTI during her hospital stay, which may have contributed to her initial symptoms.  In rehabilitation, she was diagnosed with ICU myopathy, requiring physical therapy for muscle strength. She reported improvements in endurance and energy, though she experienced right foot pain, treated with topical Voltaren . Her medications at discharge included aspirin , calcium , dorzolamide  eye drops, furosemide  as needed, latanoprost  eye drops, metformin  twice daily, pantoprazole , and a prednisone  taper.  Her blood pressure at home has been stable without medication. Her most recent GFR was above 60. She reports good bowel function without the need for Miralax . Her appetite was good in the hospital, though she had poor intake prior to admission. She drinks three to four cups of unsweetened tea daily.   Hypertension This is a chronic problem. The current episode started more than 1 year ago. The problem is controlled. Pertinent negatives include no blurred vision, chest pain, palpitations or shortness of breath. Risk factors for coronary artery disease include dyslipidemia and post-menopausal state. Past treatments include ACE inhibitors. The current treatment provides moderate improvement.     Past Medical History:  Diagnosis Date   Allergy 2000   Breast lump    Cataract    Colon polyps    Fibroids    GERD (gastroesophageal reflux disease)    Glaucoma    Heart murmur    Hyperlipidemia 01/05/1984   Hypertension 01/05/1984   Interstitial cystitis 01/04/1997   Kidney stones 01/05/2003   Neuromuscular disorder (HCC)    Osteoporosis    Preop cardiovascular exam 10/25/2022  Sleep apnea 2021   Tonsillitis      Family History  Problem Relation Age of Onset   Hypertension Mother    Heart disease Mother    Colon cancer Mother 78   Arthritis Mother    Cancer Mother    Coronary artery disease Father        mother, PGM   Stroke Father    Hypertension Father    Colon  cancer Father 42   Cancer Father    Colon cancer Maternal Grandmother    Diabetes Other    Breast cancer Neg Hx    Colon polyps Neg Hx    Esophageal cancer Neg Hx    Stomach cancer Neg Hx    Rectal cancer Neg Hx    BRCA 1/2 Neg Hx      Current Outpatient Medications:    ascorbic acid  (VITAMIN C ) 1000 MG tablet, Take 1 tablet (1,000 mg total) by mouth daily., Disp: 100 tablet, Rfl: 0   aspirin  EC 81 MG tablet, Take 1 tablet (81 mg total) by mouth daily. Swallow whole., Disp: 30 tablet, Rfl: 12   Calcium  Carbonate (CALCIUM  500 PO), Take 500 mg by mouth daily with breakfast., Disp: , Rfl:    Cholecalciferol (VITAMIN D3) 50 MCG (2000 UT) TABS, Take 2,000 Units by mouth daily with breakfast., Disp: , Rfl:    dorzolamide -timolol  (COSOPT ) 22.3-6.8 MG/ML ophthalmic solution, Place 1 drop into both eyes in the morning and at bedtime., Disp: , Rfl:    latanoprost  (XALATAN ) 0.005 % ophthalmic solution, Place 1 drop into both eyes at bedtime., Disp: , Rfl:    metFORMIN  (GLUCOPHAGE ) 500 MG tablet, Take 1 tablet (500 mg total) by mouth 2 (two) times daily with a meal., Disp: 60 tablet, Rfl: 0   pantoprazole  (PROTONIX ) 40 MG tablet, Take 1 tablet (40 mg total) by mouth daily., Disp: 30 tablet, Rfl: 0   predniSONE  (DELTASONE ) 5 MG tablet, Take 7 tablets (35 mg total) by mouth daily with breakfast for 1 day, THEN 6 tablets (30 mg total) daily with breakfast for 7 days, THEN 5 tablets (25 mg total) daily with breakfast for 7 days, THEN 4 tablets (20 mg total) daily with breakfast for 7 days, THEN 3 tablets (15 mg total) daily with breakfast for 7 days, THEN 2 tablets (10 mg total) daily with breakfast for 7 days, THEN 1 tablet (5 mg total) daily with breakfast for 7 days., Disp: 154 tablet, Rfl: 0   alum & mag hydroxide-simeth (MAALOX PLUS) 400-400-40 MG/5ML suspension, Take 15 mLs by mouth every 4 (four) hours as needed for indigestion. (Patient not taking: Reported on 07/25/2023), Disp: 180 mL, Rfl: 0    [Paused] amLODipine  (NORVASC ) 5 MG tablet, TAKE 1 TABLET BY MOUTH DAILY (Patient not taking: Reported on 07/25/2023), Disp: 90 tablet, Rfl: 3   [Paused] atorvastatin  (LIPITOR) 80 MG tablet, TAKE 1 TABLET BY MOUTH DAILY (Patient not taking: Reported on 07/25/2023), Disp: 90 tablet, Rfl: 3   [Paused] ezetimibe  (ZETIA ) 10 MG tablet, Take 1 tablet (10 mg total) by mouth daily. (Patient not taking: Reported on 07/25/2023), Disp: 90 tablet, Rfl: 3   [Paused] finasteride (PROSCAR) 5 MG tablet, Take 5 mg by mouth daily. (Patient not taking: Reported on 07/25/2023), Disp: , Rfl:    furosemide  (LASIX ) 40 MG tablet, Take 1 tablet (40 mg total) by mouth daily as needed for edema (use if Pt asks for it for edema). (Patient not taking: Reported on 07/25/2023), Disp: 30 tablet, Rfl: 0  polyethylene glycol (MIRALAX  / GLYCOLAX ) 17 g packet, Take 17 g by mouth daily as needed for mild constipation. (Patient not taking: Reported on 07/25/2023), Disp: 14 each, Rfl: 0   potassium chloride  SA (KLOR-CON  M) 20 MEQ tablet, Take 2 tablets (40 mEq total) by mouth daily as needed (give if gives Lasix  for LE edema). (Patient not taking: Reported on 07/25/2023), Disp: 10 tablet, Rfl: 0   [Paused] valsartan  (DIOVAN ) 160 MG tablet, Take 1 tablet (160 mg total) by mouth daily. (Patient not taking: Reported on 07/25/2023), Disp: 90 tablet, Rfl: 3   Allergies  Allergen Reactions   Hydrochlorothiazide Itching     Review of Systems  Constitutional: Negative.   Eyes: Negative.  Negative for blurred vision.  Respiratory: Negative.  Negative for shortness of breath.   Cardiovascular: Negative.  Negative for chest pain and palpitations.  Gastrointestinal: Negative.   Neurological: Negative.   Psychiatric/Behavioral: Negative.       Today's Vitals   07/25/23 1419  BP: 128/70  Pulse: 63  Temp: 98.3 F (36.8 C)  SpO2: 98%  Weight: 174 lb (78.9 kg)  Height: 5' 3 (1.6 m)   Body mass index is 30.82 kg/m.  Wt Readings from Last 3  Encounters:  07/25/23 174 lb (78.9 kg)  07/18/23 175 lb 4.3 oz (79.5 kg)  07/07/23 177 lb 7.5 oz (80.5 kg)     Objective:  Physical Exam Vitals and nursing note reviewed.  Constitutional:      Appearance: Normal appearance.  HENT:     Head: Normocephalic and atraumatic.  Eyes:     Extraocular Movements: Extraocular movements intact.  Cardiovascular:     Rate and Rhythm: Normal rate and regular rhythm.     Heart sounds: Normal heart sounds.  Pulmonary:     Effort: Pulmonary effort is normal.     Breath sounds: Normal breath sounds.  Musculoskeletal:     Cervical back: Normal range of motion.     Comments: Ambulatory with walker  Skin:    General: Skin is warm.  Neurological:     General: No focal deficit present.     Mental Status: She is alert.  Psychiatric:        Mood and Affect: Mood normal.        Behavior: Behavior normal.         Assessment And Plan:  Intensive care (ICU) myopathy Assessment & Plan: TCM PERFORMED. A MEMBER OF THE CLINICAL TEAM SPOKE WITH THE PATIENT UPON DISCHARGE. DISCHARGE SUMMARY WAS REVIEWED IN FULL DETAIL DURING THE VISIT. MEDS RECONCILED AND COMPARED TO DISCHARGE MEDS. MEDICATION LIST WAS UPDATED AND REVIEWED WITH THE PATIENT. GREATER THAN 50% FACE TO FACE TIME WAS SPENT IN COUNSELING AND COORDINATION OF CARE. ALL QUESTIONS WERE ANSWERED TO THE SATISFACTION OF THE PATIENT. ICU myopathy with muscle weakness improved by physical therapy. - Continue physical therapy as needed.    AKI (acute kidney injury) (HCC) Assessment & Plan: Acute kidney injury due to acute tubular necrosis, exacerbated by dehydration. Renal biopsy ruled out autoimmune causes. Dialysis performed during hospitalization. - Order kidney function tests today. - Monitor GFR, which was above 60 last week. - Consider nephrology follow-up if needed.   Hypertensive heart disease without heart failure Assessment & Plan: Chronic, well controlled. She agrees to continue to  monitor her home blood pressures. If they are consistently above 135-140/80s, will consider restarting low dose amlodipine .  Orders: -     CMP14+EGFR  Atherosclerosis of both carotid arteries Assessment & Plan:  Chronic, target LDL should be less than 70 based on carotid and coronary plaque. She will continue with atorvastatin  80mg  and ezetimibe  10mg  daily.  Encouraged to follow heart healthy lifestyle.    Prediabetes Assessment & Plan: Prednisone -induced hyperglycemia managed with metformin . - Continue metformin  twice daily.   Pure hypercholesterolemia Assessment & Plan: Chronic, LDL goal is less than 70 due to underlying carotid atherosclerosis. Statin therapy on hold until further evaluation by GI.    Autoimmune hepatitis [facility]) Assessment & Plan: GI input is appreciated.   This was confirmed by liver biopsy on 07/04/23.  Status post MRCP which showed some fusiform dilatation of the common bile duct to 1 cm, some intrahepatic regurgitation on the left side as well as pancreatic cyst.  She is scheduled for outpatient EUS and GI.  - Continue prednisone  taper. - Recheck liver function in one month. - Avoid processed meats and sugar. - Consider ginger tea for liver support, avoid turmeric supplements.   Orders: -     CMP14+EGFR  Leukocytosis, unspecified type Assessment & Plan: Leukocytosis likely due to prednisone , expected to persist during use. - Recheck white blood cell count in one month after prednisone  taper.   Class 1 obesity due to excess calories with serious comorbidity and body mass index (BMI) of 30.0 to 30.9 in adult Assessment & Plan: She is encouraged to aim for at least 150 minutes of exercise per week.    General Health Maintenance Prefers tea over water, contributing to dehydration risk. - Encourage alternating tea with water intake. - Suggest adding a splash of pomegranate juice to water for flavor. - Avoid caffeinated teas to prevent  dehydration.  Return if symptoms worsen or fail to improve.  Patient was given opportunity to ask questions. Patient verbalized understanding of the plan and was able to repeat key elements of the plan. All questions were answered to their satisfaction.   I, Catheryn LOISE Slocumb, MD, have reviewed all documentation for this visit. The documentation on 07/25/23 for the exam, diagnosis, procedures, and orders are all accurate and complete.   IF YOU HAVE BEEN REFERRED TO A SPECIALIST, IT MAY TAKE 1-2 WEEKS TO SCHEDULE/PROCESS THE REFERRAL. IF YOU HAVE NOT HEARD FROM US /SPECIALIST IN TWO WEEKS, PLEASE GIVE US  A CALL AT [phone].   THE PATIENT IS ENCOURAGED TO PRACTICE SOCIAL DISTANCING DUE TO THE COVID-19 PANDEMIC.

## 2023-07-26 ENCOUNTER — Encounter: Payer: Self-pay | Admitting: Internal Medicine

## 2023-07-26 ENCOUNTER — Ambulatory Visit: Payer: Self-pay | Admitting: Internal Medicine

## 2023-07-26 DIAGNOSIS — G7281 Critical illness myopathy: Secondary | ICD-10-CM | POA: Diagnosis not present

## 2023-07-26 DIAGNOSIS — I119 Hypertensive heart disease without heart failure: Secondary | ICD-10-CM | POA: Diagnosis not present

## 2023-07-26 DIAGNOSIS — K754 Autoimmune hepatitis: Secondary | ICD-10-CM | POA: Diagnosis not present

## 2023-07-26 DIAGNOSIS — N179 Acute kidney failure, unspecified: Secondary | ICD-10-CM | POA: Diagnosis not present

## 2023-07-26 DIAGNOSIS — D696 Thrombocytopenia, unspecified: Secondary | ICD-10-CM | POA: Diagnosis not present

## 2023-07-26 DIAGNOSIS — K862 Cyst of pancreas: Secondary | ICD-10-CM | POA: Diagnosis not present

## 2023-07-26 LAB — CMP14+EGFR
ALT: 111 IU/L — ABNORMAL HIGH (ref 0–32)
AST: 96 IU/L — ABNORMAL HIGH (ref 0–40)
Albumin: 2.9 g/dL — ABNORMAL LOW (ref 3.8–4.8)
Alkaline Phosphatase: 208 IU/L — ABNORMAL HIGH (ref 44–121)
BUN/Creatinine Ratio: 18 (ref 12–28)
BUN: 17 mg/dL (ref 8–27)
Bilirubin Total: 2.1 mg/dL — ABNORMAL HIGH (ref 0.0–1.2)
CO2: 18 mmol/L — ABNORMAL LOW (ref 20–29)
Calcium: 9.4 mg/dL (ref 8.7–10.3)
Chloride: 110 mmol/L — ABNORMAL HIGH (ref 96–106)
Creatinine, Ser: 0.95 mg/dL (ref 0.57–1.00)
Globulin, Total: 3.4 g/dL (ref 1.5–4.5)
Glucose: 82 mg/dL (ref 70–99)
Potassium: 4.4 mmol/L (ref 3.5–5.2)
Sodium: 145 mmol/L — ABNORMAL HIGH (ref 134–144)
Total Protein: 6.3 g/dL (ref 6.0–8.5)
eGFR: 61 mL/min/1.73 (ref >59)

## 2023-07-27 DIAGNOSIS — N179 Acute kidney failure, unspecified: Secondary | ICD-10-CM | POA: Diagnosis not present

## 2023-07-27 DIAGNOSIS — K754 Autoimmune hepatitis: Secondary | ICD-10-CM | POA: Diagnosis not present

## 2023-07-27 DIAGNOSIS — G7281 Critical illness myopathy: Secondary | ICD-10-CM | POA: Diagnosis not present

## 2023-07-27 DIAGNOSIS — I119 Hypertensive heart disease without heart failure: Secondary | ICD-10-CM | POA: Diagnosis not present

## 2023-07-27 DIAGNOSIS — D696 Thrombocytopenia, unspecified: Secondary | ICD-10-CM | POA: Diagnosis not present

## 2023-07-27 DIAGNOSIS — K862 Cyst of pancreas: Secondary | ICD-10-CM | POA: Diagnosis not present

## 2023-07-28 ENCOUNTER — Other Ambulatory Visit: Payer: Self-pay

## 2023-07-28 DIAGNOSIS — R945 Abnormal results of liver function studies: Secondary | ICD-10-CM

## 2023-07-28 MED ORDER — AZATHIOPRINE 50 MG PO TABS: 50.0000 mg | ORAL_TABLET | Freq: Every day | ORAL | 6 refills | Status: DC

## 2023-07-29 ENCOUNTER — Telehealth: Payer: Self-pay | Admitting: Gastroenterology

## 2023-07-29 NOTE — Telephone Encounter (Signed)
 Megan at [facility] is calling about the referral they received. They need more clinical information such as labs/diagnostics. They also need a copy of insurance card. Please fax to [phone]

## 2023-07-29 NOTE — Telephone Encounter (Signed)
Confirmation received that fax went through.

## 2023-07-29 NOTE — Telephone Encounter (Signed)
 Information printed and faxed to number provided at this time.

## 2023-08-01 ENCOUNTER — Telehealth: Payer: Self-pay | Admitting: Gastroenterology

## 2023-08-01 NOTE — Telephone Encounter (Signed)
 Called and spoke with patient. We discussed the role of immunomodulation in patients who may require long-term steroids. There are some individuals who may be able to go into remission with short-term use of steroids. Risks and benefits of immunomodulation such as azathioprine /6-MP were discussed, patient is hesitant on initiating that currently. As such our plan will be to hold on's azathioprine . We will reevaluate based on how patient's laboratories look in the coming weeks. Will appreciate hematology evaluation in the future. She is aware that she may require immune modulation in the future but would like to hold on initiating for now. I will see her in the coming weeks for pancreatic cyst fluid analysis aspiration. Will see what her repeat labs which are pended in the next week and a half show. Will have my team cancel azathioprine  prescription. Thanks. GM

## 2023-08-01 NOTE — Telephone Encounter (Signed)
 Spoke with patient. She is wanting to know if she should start the Azathioprine  now or if she should wait until after her procedure on 09/15/23. Patient also voiced concern about taking this medication at all because she read that it can cause cancer. Routing to provider to review and advise.

## 2023-08-01 NOTE — Telephone Encounter (Signed)
 Patient requesting to know if she should start Azathioprine  medication or if she should wait until after procedure in September. Please advise, thank you

## 2023-08-02 NOTE — Addendum Note (Signed)
 Addended by: NICHOLAUS JARVIS on: 08/02/2023 09:54 AM   Modules accepted: Orders

## 2023-08-02 NOTE — Telephone Encounter (Signed)
 Azathioprine  rx discontinued per provider recommendation.

## 2023-08-03 DIAGNOSIS — G7281 Critical illness myopathy: Secondary | ICD-10-CM | POA: Diagnosis not present

## 2023-08-03 DIAGNOSIS — D696 Thrombocytopenia, unspecified: Secondary | ICD-10-CM | POA: Diagnosis not present

## 2023-08-03 DIAGNOSIS — N179 Acute kidney failure, unspecified: Secondary | ICD-10-CM | POA: Diagnosis not present

## 2023-08-03 DIAGNOSIS — I119 Hypertensive heart disease without heart failure: Secondary | ICD-10-CM | POA: Diagnosis not present

## 2023-08-03 DIAGNOSIS — K754 Autoimmune hepatitis: Secondary | ICD-10-CM | POA: Diagnosis not present

## 2023-08-03 DIAGNOSIS — K862 Cyst of pancreas: Secondary | ICD-10-CM | POA: Diagnosis not present

## 2023-08-04 DIAGNOSIS — I119 Hypertensive heart disease without heart failure: Secondary | ICD-10-CM | POA: Diagnosis not present

## 2023-08-04 DIAGNOSIS — G7281 Critical illness myopathy: Secondary | ICD-10-CM | POA: Diagnosis not present

## 2023-08-04 DIAGNOSIS — K862 Cyst of pancreas: Secondary | ICD-10-CM | POA: Diagnosis not present

## 2023-08-04 DIAGNOSIS — D696 Thrombocytopenia, unspecified: Secondary | ICD-10-CM | POA: Diagnosis not present

## 2023-08-04 DIAGNOSIS — K754 Autoimmune hepatitis: Secondary | ICD-10-CM | POA: Diagnosis not present

## 2023-08-04 DIAGNOSIS — N179 Acute kidney failure, unspecified: Secondary | ICD-10-CM | POA: Diagnosis not present

## 2023-08-07 DIAGNOSIS — R7303 Prediabetes: Secondary | ICD-10-CM | POA: Insufficient documentation

## 2023-08-07 DIAGNOSIS — K754 Autoimmune hepatitis: Secondary | ICD-10-CM | POA: Insufficient documentation

## 2023-08-07 DIAGNOSIS — D72829 Elevated white blood cell count, unspecified: Secondary | ICD-10-CM | POA: Insufficient documentation

## 2023-08-07 NOTE — Assessment & Plan Note (Signed)
 Leukocytosis likely due to prednisone , expected to persist during use. - Recheck white blood cell count in one month after prednisone  taper.

## 2023-08-07 NOTE — Assessment & Plan Note (Signed)
 Chronic, well controlled. She agrees to continue to monitor her home blood pressures. If they are consistently above 135-140/80s, will consider restarting low dose amlodipine .

## 2023-08-07 NOTE — Assessment & Plan Note (Signed)
 TCM PERFORMED. A MEMBER OF THE CLINICAL TEAM SPOKE WITH THE PATIENT UPON DISCHARGE. DISCHARGE SUMMARY WAS REVIEWED IN FULL DETAIL DURING THE VISIT. MEDS RECONCILED AND COMPARED TO DISCHARGE MEDS. MEDICATION LIST WAS UPDATED AND REVIEWED WITH THE PATIENT. GREATER THAN 50% FACE TO FACE TIME WAS SPENT IN COUNSELING AND COORDINATION OF CARE. ALL QUESTIONS WERE ANSWERED TO THE SATISFACTION OF THE PATIENT. ICU myopathy with muscle weakness improved by physical therapy. - Continue physical therapy as needed.

## 2023-08-07 NOTE — Assessment & Plan Note (Signed)
 She is encouraged to aim for at least 150 minutes of exercise per week.

## 2023-08-07 NOTE — Assessment & Plan Note (Addendum)
 Chronic, LDL goal is less than 70 due to underlying carotid atherosclerosis. Statin therapy on hold until further evaluation by GI.

## 2023-08-07 NOTE — Assessment & Plan Note (Signed)
 Chronic, target LDL should be less than 70 based on carotid and coronary plaque. She will continue with atorvastatin 80mg  and ezetimibe 10mg  daily.  Encouraged to follow heart healthy lifestyle.

## 2023-08-07 NOTE — Assessment & Plan Note (Signed)
 Acute kidney injury due to acute tubular necrosis, exacerbated by dehydration. Renal biopsy ruled out autoimmune causes. Dialysis performed during hospitalization. - Order kidney function tests today. - Monitor GFR, which was above 60 last week. - Consider nephrology follow-up if needed.

## 2023-08-07 NOTE — Assessment & Plan Note (Signed)
 Prednisone -induced hyperglycemia managed with metformin . - Continue metformin  twice daily.

## 2023-08-07 NOTE — Assessment & Plan Note (Addendum)
 GI input is appreciated.   This was confirmed by liver biopsy on 07/04/23.  Status post MRCP which showed some fusiform dilatation of the common bile duct to 1 cm, some intrahepatic regurgitation on the left side as well as pancreatic cyst.  She is scheduled for outpatient EUS and GI.  - Continue prednisone  taper. - Recheck liver function in one month. - Avoid processed meats and sugar. - Consider ginger tea for liver support, avoid turmeric supplements.

## 2023-08-08 ENCOUNTER — Other Ambulatory Visit (INDEPENDENT_AMBULATORY_CARE_PROVIDER_SITE_OTHER)

## 2023-08-08 DIAGNOSIS — K862 Cyst of pancreas: Secondary | ICD-10-CM | POA: Diagnosis not present

## 2023-08-08 DIAGNOSIS — R945 Abnormal results of liver function studies: Secondary | ICD-10-CM | POA: Diagnosis not present

## 2023-08-08 DIAGNOSIS — R7401 Elevation of levels of liver transaminase levels: Secondary | ICD-10-CM

## 2023-08-08 LAB — COMPREHENSIVE METABOLIC PANEL WITH GFR
ALT: 69 U/L — ABNORMAL HIGH (ref 0–35)
AST: 53 U/L — ABNORMAL HIGH (ref 0–37)
Albumin: 2.8 g/dL — ABNORMAL LOW (ref 3.5–5.2)
Alkaline Phosphatase: 121 U/L — ABNORMAL HIGH (ref 39–117)
BUN: 14 mg/dL (ref 6–23)
CO2: 20 meq/L (ref 19–32)
Calcium: 8.8 mg/dL (ref 8.4–10.5)
Chloride: 111 meq/L (ref 96–112)
Creatinine, Ser: 0.7 mg/dL (ref 0.40–1.20)
GFR: 82.19 mL/min (ref >60.00)
Glucose, Bld: 162 mg/dL — ABNORMAL HIGH (ref 70–99)
Potassium: 3.6 meq/L (ref 3.5–5.1)
Sodium: 139 meq/L (ref 135–145)
Total Bilirubin: 1.4 mg/dL — ABNORMAL HIGH (ref 0.2–1.2)
Total Protein: 5.8 g/dL — ABNORMAL LOW (ref 6.0–8.3)

## 2023-08-08 LAB — GAMMA GT: GGT: 145 U/L — ABNORMAL HIGH (ref 7–51)

## 2023-08-08 LAB — PROTIME-INR
INR: 2.1 ratio — ABNORMAL HIGH (ref 0.8–1.0)
Prothrombin Time: 21.5 s — ABNORMAL HIGH (ref 9.6–13.1)

## 2023-08-09 ENCOUNTER — Ambulatory Visit: Payer: Self-pay | Admitting: Gastroenterology

## 2023-08-10 ENCOUNTER — Other Ambulatory Visit: Payer: Self-pay

## 2023-08-10 DIAGNOSIS — R7401 Elevation of levels of liver transaminase levels: Secondary | ICD-10-CM

## 2023-08-10 DIAGNOSIS — G7281 Critical illness myopathy: Secondary | ICD-10-CM | POA: Diagnosis not present

## 2023-08-10 DIAGNOSIS — K862 Cyst of pancreas: Secondary | ICD-10-CM

## 2023-08-10 DIAGNOSIS — D696 Thrombocytopenia, unspecified: Secondary | ICD-10-CM | POA: Diagnosis not present

## 2023-08-10 DIAGNOSIS — R945 Abnormal results of liver function studies: Secondary | ICD-10-CM

## 2023-08-10 DIAGNOSIS — I119 Hypertensive heart disease without heart failure: Secondary | ICD-10-CM | POA: Diagnosis not present

## 2023-08-10 DIAGNOSIS — N179 Acute kidney failure, unspecified: Secondary | ICD-10-CM | POA: Diagnosis not present

## 2023-08-10 DIAGNOSIS — K754 Autoimmune hepatitis: Secondary | ICD-10-CM | POA: Diagnosis not present

## 2023-08-10 MED ORDER — PHYTONADIONE 5 MG PO TABS: 10.0000 mg | ORAL_TABLET | Freq: Every day | ORAL | 0 refills | Status: DC

## 2023-08-11 DIAGNOSIS — R791 Abnormal coagulation profile: Secondary | ICD-10-CM | POA: Diagnosis not present

## 2023-08-11 DIAGNOSIS — K838 Other specified diseases of biliary tract: Secondary | ICD-10-CM | POA: Diagnosis not present

## 2023-08-11 DIAGNOSIS — K754 Autoimmune hepatitis: Secondary | ICD-10-CM | POA: Diagnosis not present

## 2023-08-11 LAB — IMMUNOGLOBULINS A/E/G/M, SERUM
IgA/Immunoglobulin A, Serum: 312 mg/dL (ref 64–422)
IgE (Immunoglobulin E), Serum: 802 [IU]/mL — AB (ref 6–495)
IgG (Immunoglobin G), Serum: 1459 mg/dL (ref 586–1602)
IgM (Immunoglobulin M), Srm: 112 mg/dL (ref 26–217)

## 2023-08-15 DIAGNOSIS — D696 Thrombocytopenia, unspecified: Secondary | ICD-10-CM | POA: Diagnosis not present

## 2023-08-15 DIAGNOSIS — R791 Abnormal coagulation profile: Secondary | ICD-10-CM | POA: Diagnosis not present

## 2023-08-15 DIAGNOSIS — N179 Acute kidney failure, unspecified: Secondary | ICD-10-CM | POA: Diagnosis not present

## 2023-08-15 DIAGNOSIS — I119 Hypertensive heart disease without heart failure: Secondary | ICD-10-CM | POA: Diagnosis not present

## 2023-08-15 DIAGNOSIS — K862 Cyst of pancreas: Secondary | ICD-10-CM | POA: Diagnosis not present

## 2023-08-15 DIAGNOSIS — K754 Autoimmune hepatitis: Secondary | ICD-10-CM | POA: Diagnosis not present

## 2023-08-15 DIAGNOSIS — G7281 Critical illness myopathy: Secondary | ICD-10-CM | POA: Diagnosis not present

## 2023-08-15 DIAGNOSIS — K838 Other specified diseases of biliary tract: Secondary | ICD-10-CM | POA: Diagnosis not present

## 2023-08-16 ENCOUNTER — Other Ambulatory Visit

## 2023-08-16 ENCOUNTER — Other Ambulatory Visit: Payer: Self-pay

## 2023-08-16 ENCOUNTER — Ambulatory Visit

## 2023-08-16 VITALS — BP 130/70 | HR 74 | Temp 98.2°F | Ht 63.0 in | Wt 174.0 lb

## 2023-08-16 DIAGNOSIS — I119 Hypertensive heart disease without heart failure: Secondary | ICD-10-CM

## 2023-08-16 LAB — BASIC METABOLIC PANEL WITH GFR
BUN/Creatinine Ratio: 24 (ref 12–28)
BUN: 18 mg/dL (ref 8–27)
CO2: 18 mmol/L — ABNORMAL LOW (ref 20–29)
Calcium: 9.2 mg/dL (ref 8.7–10.3)
Chloride: 108 mmol/L — ABNORMAL HIGH (ref 96–106)
Creatinine, Ser: 0.76 mg/dL (ref 0.57–1.00)
Glucose: 75 mg/dL (ref 70–99)
Potassium: 3.8 mmol/L (ref 3.5–5.2)
Sodium: 141 mmol/L (ref 134–144)
eGFR: 80 mL/min/1.73 (ref >59)

## 2023-08-16 NOTE — Progress Notes (Signed)
 Patient presents today for bpc. Patient reports she is taking amlodipine  5mg  in the evenings. She reports she is no longer taking valsartan . I have checked her blood pressure and it was 140/60 P74. I had patient wait 10 minutes and it is 130/70. Patient also reports still having swelling in her ankles. Patient also brought her bp readings with her. Patient was advised to continue with her amlodipine  5mg  in the evenings. She is going to have her kidney functions checked today (BMP) pending lab patient may restart Valsartan  160mg . She is to come back in 2 weeks for a [place] BPC and she will keep her follow up with Dr.Sanders as scheduled for 09/21/23. Patient also advised that swelling in her ankles is common while taking the amlodipine . YL,RMA     BP Readings from Last 3 Encounters:  07/25/23 128/70  07/18/23 (!) 141/62  07/07/23 (!) 123/53

## 2023-08-16 NOTE — Patient Instructions (Signed)
 Hypertension, Adult Hypertension is another name for high blood pressure. High blood pressure forces your heart to work harder to pump blood. This can cause problems over time. There are two numbers in a blood pressure reading. There is a top number (systolic) over a bottom number (diastolic). It is best to have a blood pressure that is below 120/80. What are the causes? The cause of this condition is not known. Some other conditions can lead to high blood pressure. What increases the risk? Some lifestyle factors can make you more likely to develop high blood pressure: Smoking. Not getting enough exercise or physical activity. Being overweight. Having too much fat, sugar, calories, or salt (sodium) in your diet. Drinking too much alcohol . Other risk factors include: Having any of these conditions: Heart disease. Diabetes. High cholesterol. Kidney disease. Obstructive sleep apnea. Having a family history of high blood pressure and high cholesterol. Age. The risk increases with age. Stress. What are the signs or symptoms? High blood pressure may not cause symptoms. Very high blood pressure (hypertensive crisis) may cause: Headache. Fast or uneven heartbeats (palpitations). Shortness of breath. Nosebleed. Vomiting or feeling like you may vomit (nauseous). Changes in how you see. Very bad chest pain. Feeling dizzy. Seizures. How is this treated? This condition is treated by making healthy lifestyle changes, such as: Eating healthy foods. Exercising more. Drinking less alcohol . Your doctor may prescribe medicine if lifestyle changes do not help enough and if: Your top number is above 130. Your bottom number is above 80. Your personal target blood pressure may vary. Follow these instructions at home: Eating and drinking  If told, follow the DASH eating plan. To follow this plan: Fill one half of your plate at each meal with fruits and vegetables. Fill one fourth of your plate  at each meal with whole grains. Whole grains include whole-wheat pasta, brown rice, and whole-grain bread. Eat or drink low-fat dairy products, such as skim milk or low-fat yogurt. Fill one fourth of your plate at each meal with low-fat (lean) proteins. Low-fat proteins include fish, chicken without skin, eggs, beans, and tofu. Avoid fatty meat, cured and processed meat, or chicken with skin. Avoid pre-made or processed food. Limit the amount of salt in your diet to less than 1,500 mg each day. Do not drink alcohol  if: Your doctor tells you not to drink. You are pregnant, may be pregnant, or are planning to become pregnant. If you drink alcohol : Limit how much you have to: 0-1 drink a day for women. 0-2 drinks a day for men. Know how much alcohol  is in your drink. In the [place] one drink equals one 12 oz bottle of beer (355 mL), one 5 oz glass of wine (148 mL), or one 1 oz glass of hard liquor (44 mL). Lifestyle  Work with your doctor to stay at a healthy weight or to lose weight. Ask your doctor what the best weight is for you. Get at least 30 minutes of exercise that causes your heart to beat faster (aerobic exercise) most days of the week. This may include walking, swimming, or biking. Get at least 30 minutes of exercise that strengthens your muscles (resistance exercise) at least 3 days a week. This may include lifting weights or doing Pilates. Do not smoke or use any products that contain nicotine  or tobacco. If you need help quitting, ask your doctor. Check your blood pressure at home as told by your doctor. Keep all follow-up visits. Medicines Take over-the-counter and prescription medicines  only as told by your doctor. Follow directions carefully. Do not skip doses of blood pressure medicine. The medicine does not work as well if you skip doses. Skipping doses also puts you at risk for problems. Ask your doctor about side effects or reactions to medicines that you should watch  for. Contact a doctor if: You think you are having a reaction to the medicine you are taking. You have headaches that keep coming back. You feel dizzy. You have swelling in your ankles. You have trouble with your vision. Get help right away if: You get a very bad headache. You start to feel mixed up (confused). You feel weak or numb. You feel faint. You have very bad pain in your: Chest. Belly (abdomen). You vomit more than once. You have trouble breathing. These symptoms may be an emergency. Get help right away. Call 911. Do not wait to see if the symptoms will go away. Do not drive yourself to the hospital. Summary Hypertension is another name for high blood pressure. High blood pressure forces your heart to work harder to pump blood. For most people, a normal blood pressure is less than 120/80. Making healthy choices can help lower blood pressure. If your blood pressure does not get lower with healthy choices, you may need to take medicine. This information is not intended to replace advice given to you by your health care provider. Make sure you discuss any questions you have with your health care provider. Document Revised: 10/09/2020 Document Reviewed: 10/09/2020 Elsevier Patient Education  2024 [employer].

## 2023-08-17 ENCOUNTER — Ambulatory Visit: Payer: Self-pay | Admitting: Internal Medicine

## 2023-08-18 ENCOUNTER — Telehealth: Payer: Self-pay | Admitting: Gastroenterology

## 2023-08-18 ENCOUNTER — Other Ambulatory Visit: Payer: Self-pay

## 2023-08-18 ENCOUNTER — Encounter: Payer: Self-pay | Admitting: Internal Medicine

## 2023-08-18 MED ORDER — PHYTONADIONE 5 MG PO TABS: 10.0000 mg | ORAL_TABLET | Freq: Every day | ORAL | 0 refills | Status: AC

## 2023-08-18 NOTE — Telephone Encounter (Signed)
 The pt has been having a problem getting the Vit K filled at the pharmacy.  I have sent to an alternate pharmacy per pt request.  [facility]

## 2023-08-18 NOTE — Telephone Encounter (Signed)
 Patient called and stated that [facility] on [address] advise her that Vitamin K was an over the counter vitamin and she could purchase it that way. Patient also stated that her husband found a vitamin store and was told it was a vitamin 1 she was need. Patient is now confused and is wanting to know what kind of vitamin K she is needing. Please advise.

## 2023-08-19 NOTE — Telephone Encounter (Signed)
 The pt has been advised and she will call with the strength so that we can assist with dose

## 2023-08-19 NOTE — Telephone Encounter (Signed)
 Dr Wilhelmenia see the message regarding Vit K and advise how pt should take.

## 2023-08-19 NOTE — Telephone Encounter (Signed)
 Patient is returning a call back to patty. Please advise.

## 2023-08-19 NOTE — Telephone Encounter (Signed)
 The pt is going to purchase OTC vit K and will call back and let us  know what she has purchased so we can let her know how much to take

## 2023-08-19 NOTE — Telephone Encounter (Signed)
 Patient called and stated that she was still having trouble trying to get her prescription for the Vitamin K. [facility] medication will cost 1400 for it. Patient stated that they pharmacy stated maybe we can send it under a different name. Please advise.

## 2023-08-19 NOTE — Telephone Encounter (Signed)
 The pt has been advised and will take as directed

## 2023-08-19 NOTE — Telephone Encounter (Signed)
 OTC is OK. Find out what dosing she is seeing and report back so we can decide overall intake per day. Thanks. GM

## 2023-08-19 NOTE — Telephone Encounter (Signed)
 Patient called and stated that she just wanted to inform Patty that her husband is buy Vitamin K2  50 MCG. Please advise.

## 2023-08-19 NOTE — Telephone Encounter (Signed)
 Dr Marissa Orozco the pt is unable to purchase VIT K can she take OTC?

## 2023-08-19 NOTE — Telephone Encounter (Signed)
 OK with this. She can take 2 pills daily.

## 2023-08-22 ENCOUNTER — Other Ambulatory Visit: Payer: Self-pay | Admitting: Internal Medicine

## 2023-08-23 ENCOUNTER — Other Ambulatory Visit: Payer: Self-pay

## 2023-08-23 MED ORDER — PANTOPRAZOLE SODIUM 40 MG PO TBEC
40.0000 mg | DELAYED_RELEASE_TABLET | Freq: Every day | ORAL | 1 refills | Status: DC
Start: 2023-08-23 — End: 2023-09-15

## 2023-08-23 MED ORDER — METFORMIN HCL 500 MG PO TABS: 500.0000 mg | ORAL_TABLET | Freq: Two times a day (BID) | ORAL | 0 refills | Status: DC

## 2023-08-24 ENCOUNTER — Other Ambulatory Visit: Payer: Self-pay | Admitting: Internal Medicine

## 2023-08-24 NOTE — Progress Notes (Unsigned)
 Will resume valsartan  160mg  due to elevated blood pressures. Repeat BP in one week, along with BMP.  RS

## 2023-08-25 ENCOUNTER — Ambulatory Visit (INDEPENDENT_AMBULATORY_CARE_PROVIDER_SITE_OTHER): Admitting: Gastroenterology

## 2023-08-25 ENCOUNTER — Ambulatory Visit: Admitting: Internal Medicine

## 2023-08-25 ENCOUNTER — Encounter: Payer: Self-pay | Admitting: Gastroenterology

## 2023-08-25 VITALS — BP 120/60 | HR 77 | Ht 63.0 in | Wt 169.0 lb

## 2023-08-25 DIAGNOSIS — H538 Other visual disturbances: Secondary | ICD-10-CM

## 2023-08-25 DIAGNOSIS — K862 Cyst of pancreas: Secondary | ICD-10-CM

## 2023-08-25 DIAGNOSIS — K838 Other specified diseases of biliary tract: Secondary | ICD-10-CM | POA: Diagnosis not present

## 2023-08-25 DIAGNOSIS — K754 Autoimmune hepatitis: Secondary | ICD-10-CM | POA: Diagnosis not present

## 2023-08-25 DIAGNOSIS — R748 Abnormal levels of other serum enzymes: Secondary | ICD-10-CM

## 2023-08-25 NOTE — Progress Notes (Signed)
 Marissa Orozco [id number] 08-27-1945   Chief Complaint: Hospital follow up  Referring Provider: Jarold Medici, MD Primary GI MD: Dr. Mansouraty  HPI: Marissa Orozco is a 78 y.o. female with past medical history of GERD, colon polyps, family history of colon cancer, HTN, HLD, CVA, OSA, osteoporosis, kidney stones who presents today for hospital follow-up.    Patient admitted to [facility] 06/17/2023 to 07/07/2023 after presenting to the ED with fatigue, found to have low blood pressure and poor oral intake.  Was found to be in acute kidney injury and underwent extensive renal workup.  Had to have a dialysis catheter placed and dialyzed on 06/25/2023. Concern for glomerulonephritis however per Nephrology, renal biopsy done and revealed chronic changes, AIN and some TMA but no immune complex mediated or crescentic glomerular nephritis noted.   Status post liver biopsy done on 07/04/2023 and results are consistent with autoimmune hepatitis.   She was found to have transaminitis in the setting of autoimmune hepatitis. Status post MRCP which showed some fusiform dilatation of the common bile duct to 1 cm, some intrahepatic regurgitation on the left side as well as pancreatic cyst.  Status post liver biopsy, done on 07/04/2023,showing autoimmune hepatitis. Started on long taper of prednisone  consideration for azathioprine  if normal TPMT.  Labs on admission with T. bili 4.0, alk phos 262, AST 927, ALT 448, direct bilirubin was 2.1, INR 1.4. Labs in February 2025 with liver chemistries within normal limits.   TPMT 07/07/2023 was normal.   07/25/2023 patient noted to have improving liver enzymes on steroid taper. She has been referred to hepatology. Plan was to start azathioprine , but after discussion with patient she was hesitant to initiate that.  Azathioprine  prescription discontinued.  On most recent labs liver enzymes continue to improve/trend downward, and immunoglobulin levels  decreasing as well.  INR has been trending upwards.  Dr. Wilhelmenia recommended initiating oral vitamin K 10 mg daily for 2 weeks to see if there is a vitamin K deficiency or if INR is elevated as a result of autoimmune hepatitis. Will ultimately defer to [facility] regarding overall management.  She is scheduled for upper EUS 09/15/2023 for evaluation of biliary tree and pancreatic cyst with elevated CA 19-9.  She needs repeat labs either today or early next week.  Orders are already placed for CMP, GGT, PT/INR, immunoglobulin levels.  Vitamin K level 08/15/2023 is 1.9.  She had appointment with [facility] 08/11/2023.  Was started on prednisone  30 mg for 2 weeks, then 20 mg until told to stop or change dose. Also started on azathioprine  50 mg daily until told to stop or change dose.  She will have labs every 2 weeks during initiation of treatment. She has a follow-up appointment scheduled 11/28/2023.  Not a candidate for liver transplant due to advanced age and concern for pancreatic malignancy.  Most recent labs 08/08/2023 showed alk phos 121, AST 53, ALT 69, albumin 2.8, INR 2.1, PT 21.5, GGT 145, improvement of IgG to 1459 (normal range), elevated IgE 802   ------------------------TODAY-------------------------------------------  Patient here today with her husband.  States she is feeling better since her hospitalization.  She denies any nausea, vomiting, fever, chills, abdominal pain.  She has a good appetite and is eating and drinking normally.  States she is having regular bowel movements and denies any diarrhea, constipation, blood in her stool, melena.  Stool is normal in color.  She denies any confusion, jaundice, abdominal swelling.  She does have some  lower extremity edema which she states is due to her amlodipine .  She does endorse blurry vision since hospital discharge and is scheduled for an appointment with ophthalmology next week.  She denies any dizziness, lightheadedness,  syncope, falls.  She denies chest pain.  States she has shortness of breath with exertion such as with taking the stairs, but is able to walk around without problems.  She is taking azathioprine  50 mg daily as prescribed by hepatology.  Currently on a gradual prednisone  taper.  States she is taking six 5 mg tablets daily, and will be reducing to 4 tablets daily starting tomorrow. She has been taking OTC vitamin K, 50 mcg 2 tablets daily.  Reports that she had blood work done with hepatology but they did not get enough blood to run all the tests and she is going to [facility] after this appointment to have remaining blood tests drawn.  Denies any other concerns today.  She is aware of upcoming upper EUS and has no questions at this time.  Previous GI Procedures/Imaging   Liver biopsy 07/04/2023 A. LIVER, RIGHT LOBE, NEEDLE CORE BIOPSY:  -  Acute on chronic hepatitis with evidence of bridging necrosis, see  note.   MRCP 07/02/2023 1. Abnormal heterogeneous appearance of the liver with multifocal scattered areas of relative increased T2 signal throughout both lobes of liver with increased T2 periportal signal. Imaging findings are nonspecific likely reflect hepatic inflammation, edema, early infiltrative disease or evolving hepatocellular injury. Underlying infiltrative tumor would be difficult to exclude without the benefit of IV contrast material. 2. Fusiform dilatation of the common bile duct measures up to 1 cm. There is moderate intrahepatic dilatation to the left lobe. Mild right lobe of liver biliary dilatation. No signs of choledocholithiasis. 3. Numerous cysts identified within the dominant cyst is centered around the uncinate process measuring 5.2 x 4.6 cm. Findings may reflect mucinous cystic neoplasm versus side branch IPMN. Recommend referral to gastroenterology for further management. 4. Enlarged porta hepatic lymph node measures 1.3 cm. 5. Small bilateral pleural  effusions. 6. Lesion off the anterior cortex of the upper pole of the right kidney measures 0.9 cm. 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  RUQ US  06/29/2023 Fatty liver. No other focal abnormality is seen.   Colonoscopy 02/02/2023 - The examined portion of the ileum was normal.  - Three 3 to 5 mm polyps in the transverse colon and in the ascending colon, removed with a cold snare. Resected and retrieved.  - One 3 mm polyp in the rectum, removed with a cold snare. Resected and retrieved.  - Non- bleeding internal hemorrhoids. - Recall 5 years Path: 1. Surgical [P], colon, transverse and ascending, polyp (3) :       TUBULAR ADENOMA (1) WITHOUT HIGH GRADE DYSPLASIA.       BENIGN COLONIC MUCOSA (2).        2. Surgical [P], colon, rectum, polyp (1) :       COLONIC MUCOSA WITH BENIGN LYMPHOID AGGREGATES.       MULTIPLE ADDITIONAL LEVELS EXAMINED.    Past Medical History:  Diagnosis Date   Allergy 2000   Breast lump    Cataract    Colon polyps    Fibroids    GERD (gastroesophageal reflux disease)    Glaucoma    Heart murmur    Hyperlipidemia 01/05/1984   Hypertension 01/05/1984   Interstitial cystitis 01/04/1997   Kidney stones 01/05/2003  Neuromuscular disorder (HCC)    Osteoporosis    Preop cardiovascular exam 10/25/2022   Sleep apnea 2021   Tonsillitis     Past Surgical History:  Procedure Laterality Date   ABDOMINAL HYSTERECTOMY  1997   BREAST EXCISIONAL BIOPSY Right 07/27/2006   Intraductal Papilloma   BREAST SURGERY  2008   CATARACT EXTRACTION Bilateral 02/2020   other in 05/2020   COLONOSCOPY  2010   Dr Kristie   EYE SURGERY  2022   HYSTEROSCOPY  1993   IR FLUORO GUIDE CV LINE RIGHT  06/24/2023   IR REMOVAL TUN CV CATH W/O FL  07/04/2023   IR US  GUIDE VASC ACCESS RIGHT  06/24/2023   LAPAROSCOPIC HYSTERECTOMY  1994   MYOMECTOMY  1993   excision of tumor   POLYPECTOMY      TONSILLECTOMY  1953   TUBAL LIGATION  1980    Current Outpatient Medications  Medication Sig Dispense Refill   [Paused] amLODipine  (NORVASC ) 5 MG tablet TAKE 1 TABLET BY MOUTH DAILY 90 tablet 3   ascorbic acid  (VITAMIN C ) 1000 MG tablet Take 1 tablet (1,000 mg total) by mouth daily. 100 tablet 0   aspirin  EC 81 MG tablet Take 1 tablet (81 mg total) by mouth daily. Swallow whole. 30 tablet 12   [Paused] atorvastatin  (LIPITOR) 80 MG tablet TAKE 1 TABLET BY MOUTH DAILY 90 tablet 3   Calcium  Carbonate (CALCIUM  500 PO) Take 500 mg by mouth daily with breakfast.     Cholecalciferol (VITAMIN D3) 50 MCG (2000 UT) TABS Take 2,000 Units by mouth daily with breakfast.     dorzolamide -timolol  (COSOPT ) 22.3-6.8 MG/ML ophthalmic solution Place 1 drop into both eyes in the morning and at bedtime.     [Paused] ezetimibe  (ZETIA ) 10 MG tablet Take 1 tablet (10 mg total) by mouth daily. 90 tablet 3   [Paused] finasteride (PROSCAR) 5 MG tablet Take 5 mg by mouth daily.     latanoprost  (XALATAN ) 0.005 % ophthalmic solution Place 1 drop into both eyes at bedtime.     metFORMIN  (GLUCOPHAGE ) 500 MG tablet Take 1 tablet (500 mg total) by mouth 2 (two) times daily with a meal. 180 tablet 0   pantoprazole  (PROTONIX ) 40 MG tablet Take 1 tablet (40 mg total) by mouth daily. 90 tablet 1   phytonadione  (VITAMIN K) 5 MG tablet Take 2 tablets (10 mg total) by mouth daily for 14 doses. 28 tablet 0   predniSONE  (DELTASONE ) 5 MG tablet Take 7 tablets (35 mg total) by mouth daily with breakfast for 1 day, THEN 6 tablets (30 mg total) daily with breakfast for 7 days, THEN 5 tablets (25 mg total) daily with breakfast for 7 days, THEN 4 tablets (20 mg total) daily with breakfast for 7 days, THEN 3 tablets (15 mg total) daily with breakfast for 7 days, THEN 2 tablets (10 mg total) daily with breakfast for 7 days, THEN 1 tablet (5 mg total) daily with breakfast for 7 days. 154 tablet 0   valsartan  (DIOVAN ) 160 MG tablet Take 1 tablet  (160 mg total) by mouth daily. 90 tablet 3   No current facility-administered medications for this visit.    Allergies as of 08/25/2023 - Review Complete 08/25/2023  Allergen Reaction Noted   Hydrochlorothiazide Itching 05/29/2010    Family History  Problem Relation Age of Onset   Hypertension Mother    Heart disease Mother    Colon cancer Mother 15   Arthritis Mother    Cancer Mother  Coronary artery disease Father        mother, PGM   Stroke Father    Hypertension Father    Colon cancer Father 24   Cancer Father    Colon cancer Maternal Grandmother    Diabetes Other    Breast cancer Neg Hx    Colon polyps Neg Hx    Esophageal cancer Neg Hx    Stomach cancer Neg Hx    Rectal cancer Neg Hx    BRCA 1/2 Neg Hx     Social History   Tobacco Use   Smoking status: Never   Smokeless tobacco: Never  Vaping Use   Vaping status: Never Used  Substance Use Topics   Alcohol use: Yes    Alcohol/week: 5.0 standard drinks of alcohol    Types: 5 Glasses of wine per week   Drug use: No     Review of Systems:    Constitutional: No fever, chills Eyes: Blurry vision Cardiovascular: No chest pain Respiratory: Shortness of breath with exertion Gastrointestinal: See HPI and otherwise negative Neurological: No headache, dizziness or syncope Hematologic: No bleeding    Physical Exam:  Vital signs: BP 120/60   Pulse 77   Ht 5' 3 (1.6 m)   Wt 169 lb (76.7 kg)   BMI 29.94 kg/m   Constitutional: Pleasant female in NAD, alert and cooperative, uses a cane Head:  Normocephalic and atraumatic.  Eyes: No scleral icterus. Mouth: No oral lesions. Respiratory: Respirations even and unlabored. Lungs clear to auscultation bilaterally.  No wheezes, crackles, or rhonchi.  Cardiovascular:  Regular rate and rhythm. No murmurs.  Mild bilateral pitting edema of lower extremities. Gastrointestinal:  Soft, nondistended, nontender. No rebound or guarding. Normal bowel sounds. No appreciable  masses or hepatomegaly. Rectal:  Not performed.  Neurologic:  Alert and oriented x4;  grossly normal neurologically.  Negative asterixis. Skin:   Dry and intact without significant lesions or rashes. Psychiatric: Oriented to person, place and time. Demonstrates good judgement and reason without abnormal affect or behaviors.   RELEVANT LABS AND IMAGING: CBC    Component Value Date/Time   WBC 17.6 (H) 07/18/2023 0509   RBC 3.69 (L) 07/18/2023 0509   HGB 12.0 07/18/2023 0509   HGB 14.2 02/09/2022 0913   HCT 34.6 (L) 07/18/2023 0509   HCT 41.9 02/09/2022 0913   PLT 150 07/18/2023 0509   PLT 314 02/09/2022 0913   MCV 93.8 07/18/2023 0509   MCV 90 02/09/2022 0913   MCH 32.5 07/18/2023 0509   MCHC 34.7 07/18/2023 0509   RDW 23.9 (H) 07/18/2023 0509   RDW 13.3 02/09/2022 0913   LYMPHSABS 3.5 07/18/2023 0509   LYMPHSABS 1.5 05/25/2018 1029   MONOABS 1.3 (H) 07/18/2023 0509   EOSABS 0.1 07/18/2023 0509   EOSABS 0.1 05/25/2018 1029   BASOSABS 0.1 07/18/2023 0509   BASOSABS 0.1 05/25/2018 1029    CMP     Component Value Date/Time   NA 141 08/16/2023 1025   K 3.8 08/16/2023 1025   CL 108 (H) 08/16/2023 1025   CO2 18 (L) 08/16/2023 1025   GLUCOSE 75 08/16/2023 1025   GLUCOSE 162 (H) 08/08/2023 1116   BUN 18 08/16/2023 1025   CREATININE 0.76 08/16/2023 1025   CALCIUM  9.2 08/16/2023 1025   PROT 5.8 (L) 08/08/2023 1116   PROT 6.3 07/25/2023 1526   ALBUMIN 2.8 (L) 08/08/2023 1116   ALBUMIN 2.9 (L) 07/25/2023 1526   AST 53 (H) 08/08/2023 1116   ALT 69 (H) 08/08/2023  1116   ALKPHOS 121 (H) 08/08/2023 1116   BILITOT 1.4 (H) 08/08/2023 1116   BILITOT 2.1 (H) 07/25/2023 1526   GFRNONAA >60 07/18/2023 0509   GFRAA 89 10/18/2019 0913   Echocardiogram 12/02/2022 1. Left ventricular ejection fraction, by estimation, is 65 to 70% . The left ventricle has normal function. The left ventricle has no regional wall motion abnormalities. There is mild concentric left ventricular hypertrophy.  Left ventricular diastolic parameters are indeterminate.  2. Right ventricular systolic function is normal. The right ventricular size is normal. There is normal pulmonary artery systolic pressure.  3. The mitral valve is normal in structure. Mild to moderate mitral valve regurgitation. No evidence of mitral stenosis.  4. The aortic valve is normal in structure. Aortic valve regurgitation is mild. Aortic valve sclerosis/ calcification is present, without any evidence of aortic stenosis.  5. The inferior vena cava is normal in size with greater than 50% respiratory variability, suggesting right atrial pressure of 3 mmHg.  Assessment/Plan:   Autoimmune hepatitis Pancreatic cyst CBD dilation Elevated liver enzymes Blurry vision Patient seen today for hospital follow-up.  Admitted 06/17/2023 to 07/07/2023, found to have acute kidney injury as well as significant transaminitis. Labs on admission with T. bili 4.0, alk phos 262, AST 927, ALT 448, direct bilirubin was 2.1, INR 1.4. Labs in February 2025 with liver chemistries within normal limits.  Liver biopsy showed autoimmune hepatitis.  She was started on prednisone  taper.  Since hospital discharge liver enzymes have been downtrending.  She has had initial consult with hepatology and has been started on azathioprine  50 mg daily.  Also restarted on slower prednisone  taper. Dr. Wilhelmenia started Vitamin K due to elevated INR and concern for possible vitamin K deficiency.  Vitamin K level drawn 08/15/2023 was normal at 1.9. During hospitalization had MRCP with findings of CBD dilatation with moderate intrahepatic dilatation of the left lobe.  Mild right lobe of liver biliary dilatation and no signs of choledocholithiasis.  Pancreatic cyst also seen. She is scheduled for upper EUS 09/15/2023 for further evaluation.  Patient is doing well with no complaints at this time aside from blurry vision which has been ongoing since she got out of the hospital.  She  has an appointment next week with ophthalmology for further evaluation of this.  Denies any dizziness, headache, light headedness, syncope. She is taking her medications as prescribed.  States she has had labs drawn with hepatology, but they did not get enough blood at last lab visit and she is going back after this appointment to have remaining labs drawn. She will be due for repeat labs with us  next week (unless same labs are drawn with hepatology today).  - Continue prednisone  taper and azathioprine  50 mg daily as instructed.  Will defer further management of this to hepatology. - Will discuss with Dr. Wilhelmenia regarding continued Vitamin K supplementation.  Vitamin K 1 level 08/15/2023 was normal at 1.9. - Due for follow-up labs around 08/29/2023.  Unless these are drawn today at [facility], will have patient come back next week for repeat CMP, GGT, PT/INR, immunoglobulin levels (orders are already placed). Will set reminder to check next week.  - Continue plan for upper EUS next month as scheduled. - Will schedule follow-up office visit with Dr. Wilhelmenia in October.   Camie Furbish, PA-C [facility] 08/25/2023, 9:40 AM  Patient Care Team: Jarold Medici, MD as PCP - General (Internal Medicine) Anner Alm ORN, MD as PCP - Cardiology (Cardiology) Pa, [facility]

## 2023-08-25 NOTE — Patient Instructions (Addendum)
 Follow up as scheduled with Dr Wilhelmenia on 11/01/23.   _______________________________________________________  If your blood pressure at your visit was 140/90 or greater, please contact your primary care physician to follow up on this.  _______________________________________________________  If you are age 79 or older, your body mass index should be between 23-30. Your Body mass index is 29.94 kg/m. If this is out of the aforementioned range listed, please consider follow up with your Primary Care Provider.  If you are age 5 or younger, your body mass index should be between 19-25. Your Body mass index is 29.94 kg/m. If this is out of the aformentioned range listed, please consider follow up with your Primary Care Provider.   ________________________________________________________  The [facility] providers would like to encourage you to use MYCHART to communicate with providers for non-urgent requests or questions.  Due to long hold times on the telephone, sending your provider a message by [facility] may be a faster and more efficient way to get a response.  Please allow 48 business hours for a response.  Please remember that this is for non-urgent requests.  _______________________________________________________  [facility] is using a team-based approach to care.  Your team is made up of your doctor and two to three APPS. Our APPS (Nurse Practitioners and Physician Assistants) work with your physician to ensure care continuity for you. They are fully qualified to address your health concerns and develop a treatment plan. They communicate directly with your gastroenterologist to care for you. Seeing the Advanced Practice Practitioners on your physician's team can help you by facilitating care more promptly, often allowing for earlier appointments, access to diagnostic testing, procedures, and other specialty referrals.

## 2023-08-26 NOTE — Progress Notes (Signed)
 Attending Physician's Attestation   I have reviewed the chart.   I agree with the Advanced Practitioner's note, impression, and recommendations with any updates as below. I am glad to hear that she is doing well.  Continue steroids/azathioprine , now as per [facility].  Plan for EUS with cyst aspiration to better define cystic characteristics further.  Will see how INR hopefully improves as well.   Aloha Finner, MD [facility] Office # [phone]

## 2023-08-29 ENCOUNTER — Encounter: Payer: Self-pay | Admitting: Internal Medicine

## 2023-08-29 ENCOUNTER — Ambulatory Visit (INDEPENDENT_AMBULATORY_CARE_PROVIDER_SITE_OTHER): Admitting: Internal Medicine

## 2023-08-29 VITALS — BP 110/70 | HR 73 | Temp 98.8°F | Ht 63.0 in | Wt 175.8 lb

## 2023-08-29 DIAGNOSIS — K754 Autoimmune hepatitis: Secondary | ICD-10-CM

## 2023-08-29 DIAGNOSIS — R7303 Prediabetes: Secondary | ICD-10-CM | POA: Diagnosis not present

## 2023-08-29 DIAGNOSIS — R251 Tremor, unspecified: Secondary | ICD-10-CM

## 2023-08-29 DIAGNOSIS — E6609 Other obesity due to excess calories: Secondary | ICD-10-CM

## 2023-08-29 DIAGNOSIS — H401131 Primary open-angle glaucoma, bilateral, mild stage: Secondary | ICD-10-CM | POA: Diagnosis not present

## 2023-08-29 DIAGNOSIS — H02403 Unspecified ptosis of bilateral eyelids: Secondary | ICD-10-CM | POA: Diagnosis not present

## 2023-08-29 DIAGNOSIS — I10 Essential (primary) hypertension: Secondary | ICD-10-CM

## 2023-08-29 DIAGNOSIS — H5213 Myopia, bilateral: Secondary | ICD-10-CM | POA: Diagnosis not present

## 2023-08-29 DIAGNOSIS — Z6831 Body mass index (BMI) 31.0-31.9, adult: Secondary | ICD-10-CM | POA: Diagnosis not present

## 2023-08-29 DIAGNOSIS — E66811 Obesity, class 1: Secondary | ICD-10-CM

## 2023-08-29 DIAGNOSIS — E7800 Pure hypercholesterolemia, unspecified: Secondary | ICD-10-CM

## 2023-08-29 DIAGNOSIS — H26491 Other secondary cataract, right eye: Secondary | ICD-10-CM | POA: Diagnosis not present

## 2023-08-29 NOTE — Progress Notes (Signed)
 LILLETTE Catheryn LOISE Jarold, MD,acting as a scribe for Catheryn LOISE Jarold, MD.,have documented all relevant documentation on the behalf of Catheryn LOISE Jarold, MD,as directed by  Catheryn LOISE Jarold, MD while in the presence of Catheryn LOISE Jarold, MD.  Subjective:  Patient ID: Marissa Orozco , female    DOB: June 23, 1945 , 78 y.o.   MRN: [id number]  Chief Complaint  Patient presents with   Hypertension    Patient presents today for a bp and pre dm follow up, Patient reports compliance with medication. Patient denies any chest pain, SOB, or headaches.    Shaking    Patient reports her hands have been shaking for about 6 weeks. She reports her left hand is worse, she notices it when she is holding something or manipulating something.     HPI Discussed the use of AI scribe software for clinical note transcription with the patient, who gave verbal consent to proceed.  History of Present Illness Marissa Orozco is a 78 year old female with hypertension and liver disease who presents for follow-up of her blood pressure and liver function.  She is accompanied by her husband today.   She is currently taking valsartan  for hypertension, which has helped lower her blood pressure, though she has experienced a few high readings. She denies lightheadedness or dizziness. She recently attended a crab festival in [place]  and managed without using a walker.  For her liver condition, she is under the care of a liver specialist and a GI doctor. Recent lab work indicates a decrease in ALT levels from 137 on July 14th to 42 currently. Her immunoglobulin levels are normal, and her kidney function remains stable. She is on prednisone , currently at a dose of 4 mg, reduced from 6 mg. She experiences hand tremors, mainly in her left hand, which she attributes to prednisone . No issues with sleep or increased urination frequency.  She visited an eye doctor due to blurry vision; the eye doctor told her it could be corrected with glasses. She  provided a list of her medications to the eye doctor, but prednisone  was not discussed as a potential cause of her vision issues.  She is taking metformin , which may be helping to maintain lower blood sugar levels. She has not been craving salty foods despite being on prednisone .   Hypertension This is a chronic problem. The current episode started more than 1 year ago. The problem is controlled. Pertinent negatives include no blurred vision, chest pain, palpitations or shortness of breath. Risk factors for coronary artery disease include dyslipidemia and post-menopausal state. Past treatments include ACE inhibitors. The current treatment provides moderate improvement.     Past Medical History:  Diagnosis Date   Allergy 2000   Breast lump    Cataract    Colon polyps    Fibroids    GERD (gastroesophageal reflux disease)    Glaucoma    Heart murmur    Hyperlipidemia 01/05/1984   Hypertension 01/05/1984   Interstitial cystitis 01/04/1997   Kidney stones 01/05/2003   Neuromuscular disorder (HCC)    Osteoporosis    Preop cardiovascular exam 10/25/2022   Sleep apnea 2021   Tonsillitis      Family History  Problem Relation Age of Onset   Hypertension Mother    Heart disease Mother    Colon cancer Mother 28   Arthritis Mother    Cancer Mother    Coronary artery disease Father        mother, PGM   Stroke  Father    Hypertension Father    Colon cancer Father 37   Cancer Father    Colon cancer Maternal Grandmother    Diabetes Other    Breast cancer Neg Hx    Colon polyps Neg Hx    Esophageal cancer Neg Hx    Stomach cancer Neg Hx    Rectal cancer Neg Hx    BRCA 1/2 Neg Hx      Current Outpatient Medications:    [Paused] amLODipine  (NORVASC ) 5 MG tablet, TAKE 1 TABLET BY MOUTH DAILY, Disp: 90 tablet, Rfl: 3   ascorbic acid  (VITAMIN C ) 1000 MG tablet, Take 1 tablet (1,000 mg total) by mouth daily., Disp: 100 tablet, Rfl: 0   aspirin  EC 81 MG tablet, Take 1 tablet (81 mg  total) by mouth daily. Swallow whole., Disp: 30 tablet, Rfl: 12   [Paused] atorvastatin  (LIPITOR) 80 MG tablet, TAKE 1 TABLET BY MOUTH DAILY, Disp: 90 tablet, Rfl: 3   Calcium  Carbonate (CALCIUM  500 PO), Take 500 mg by mouth daily with breakfast., Disp: , Rfl:    Cholecalciferol (VITAMIN D3) 50 MCG (2000 UT) TABS, Take 2,000 Units by mouth daily with breakfast., Disp: , Rfl:    dorzolamide -timolol  (COSOPT ) 22.3-6.8 MG/ML ophthalmic solution, Place 1 drop into both eyes in the morning and at bedtime., Disp: , Rfl:    [Paused] ezetimibe  (ZETIA ) 10 MG tablet, Take 1 tablet (10 mg total) by mouth daily., Disp: 90 tablet, Rfl: 3   [Paused] finasteride (PROSCAR) 5 MG tablet, Take 5 mg by mouth daily., Disp: , Rfl:    latanoprost  (XALATAN ) 0.005 % ophthalmic solution, Place 1 drop into both eyes at bedtime., Disp: , Rfl:    metFORMIN  (GLUCOPHAGE ) 500 MG tablet, Take 1 tablet (500 mg total) by mouth 2 (two) times daily with a meal., Disp: 180 tablet, Rfl: 0   pantoprazole  (PROTONIX ) 40 MG tablet, Take 1 tablet (40 mg total) by mouth daily., Disp: 90 tablet, Rfl: 1   valsartan  (DIOVAN ) 160 MG tablet, Take 1 tablet (160 mg total) by mouth daily., Disp: 90 tablet, Rfl: 3   Allergies  Allergen Reactions   Hydrochlorothiazide Itching     Review of Systems  Constitutional: Negative.   Eyes:  Negative for blurred vision.  Respiratory: Negative.  Negative for shortness of breath.   Cardiovascular: Negative.  Negative for chest pain and palpitations.  Gastrointestinal: Negative.   Neurological:  Positive for tremors.  Psychiatric/Behavioral: Negative.       Today's Vitals   08/29/23 1410  BP: 110/70  Pulse: 73  Temp: 98.8 F (37.1 C)  TempSrc: Oral  Weight: 175 lb 12.8 oz (79.7 kg)  Height: 5' 3 (1.6 m)  PainSc: 0-No pain   Body mass index is 31.14 kg/m.  Wt Readings from Last 3 Encounters:  08/29/23 175 lb 12.8 oz (79.7 kg)  08/25/23 169 lb (76.7 kg)  08/16/23 174 lb (78.9 kg)    The  10-year ASCVD risk score (Arnett DK, et al., 2019) is: 11.8%   Values used to calculate the score:     Age: 62 years     Clincally relevant sex: Female     Is Non-Hispanic African American: Yes     Diabetic: No     Tobacco smoker: No     Systolic Blood Pressure: 110 mmHg     Is BP treated: Yes     HDL Cholesterol: 53 mg/dL     Total Cholesterol: 149 mg/dL  Objective:  Physical Exam Vitals  and nursing note reviewed.  Constitutional:      Appearance: Normal appearance.  HENT:     Head: Normocephalic and atraumatic.  Eyes:     Extraocular Movements: Extraocular movements intact.  Cardiovascular:     Rate and Rhythm: Normal rate and regular rhythm.     Heart sounds: Normal heart sounds.  Pulmonary:     Effort: Pulmonary effort is normal.     Breath sounds: Normal breath sounds.  Musculoskeletal:     Cervical back: Normal range of motion.     Comments: Ambulatory without assistance  Skin:    General: Skin is warm.  Neurological:     General: No focal deficit present.     Mental Status: She is alert.     Comments: Intentional tremor on the left.  Psychiatric:        Mood and Affect: Mood normal.        Behavior: Behavior normal.      Assessment And Plan:  HTN (hypertension), malignant Assessment & Plan: Managed with valsartan  and amlodipine . Valsartan  recently restarted, no issues thus far. Blood pressure improved but some elevated readings persist. - Continue valsartan . - Monitor blood pressure readings and send them to the office. - Schedule a nurse visit for blood pressure check on September 2nd.   Autoimmune hepatitis (HCC) Assessment & Plan: Liver function improving with decreased ALT levels. Prednisone  taper ongoing at 4 mg. Liver specialist coordinating lab tests. - Obtain liver lab test today. - Continue prednisone  taper as directed by liver specialist.  Orders: -     Gamma GT  Prediabetes Assessment & Plan: Impaired glucose tolerance likely due to  prednisone . Current A1c unknown, previous A1c was elevated. Metformin  managing blood sugar. - Obtain A1c test today. - Continue metformin   Orders: -     Hemoglobin A1c  Tremor Assessment & Plan: Tremors likely due to prednisone . Blurred vision possibly related to prednisone , though correctable with glasses. Tapering prednisone  may alleviate side effects. - Continue prednisone  taper. - Monitor for improvement in tremors and vision as prednisone  dose decreases. - Will refer her to Neurology if her symptoms persist.    Class 1 obesity due to excess calories with serious comorbidity and body mass index (BMI) of 31.0 to 31.9 in adult Assessment & Plan: She is encouraged to aim for at least 150 minutes of exercise per week.     Return in 8 days (on 09/06/2023), or [place] - bp check/lab visit.  Patient was given opportunity to ask questions. Patient verbalized understanding of the plan and was able to repeat key elements of the plan. All questions were answered to their satisfaction.    I, Catheryn LOISE Slocumb, MD, have reviewed all documentation for this visit. The documentation on 08/29/23 for the exam, diagnosis, procedures, and orders are all accurate and complete.   IF YOU HAVE BEEN REFERRED TO A SPECIALIST, IT MAY TAKE 1-2 WEEKS TO SCHEDULE/PROCESS THE REFERRAL. IF YOU HAVE NOT HEARD FROM US /SPECIALIST IN TWO WEEKS, PLEASE GIVE US  A CALL AT [phone].

## 2023-08-30 ENCOUNTER — Ambulatory Visit: Payer: Self-pay | Admitting: Internal Medicine

## 2023-08-30 ENCOUNTER — Encounter: Payer: Self-pay | Admitting: Internal Medicine

## 2023-08-30 LAB — HEMOGLOBIN A1C
Est. average glucose Bld gHb Est-mCnc: 117 mg/dL
Hgb A1c MFr Bld: 5.7 % — ABNORMAL HIGH (ref 4.8–5.6)

## 2023-08-30 LAB — GAMMA GT: GGT: 152 IU/L — ABNORMAL HIGH (ref 0–60)

## 2023-08-31 ENCOUNTER — Other Ambulatory Visit (INDEPENDENT_AMBULATORY_CARE_PROVIDER_SITE_OTHER): Payer: Self-pay | Admitting: Internal Medicine

## 2023-08-31 DIAGNOSIS — I6523 Occlusion and stenosis of bilateral carotid arteries: Secondary | ICD-10-CM | POA: Diagnosis not present

## 2023-08-31 DIAGNOSIS — I119 Hypertensive heart disease without heart failure: Secondary | ICD-10-CM | POA: Diagnosis not present

## 2023-08-31 DIAGNOSIS — I358 Other nonrheumatic aortic valve disorders: Secondary | ICD-10-CM

## 2023-08-31 DIAGNOSIS — K754 Autoimmune hepatitis: Secondary | ICD-10-CM | POA: Diagnosis not present

## 2023-08-31 DIAGNOSIS — R7303 Prediabetes: Secondary | ICD-10-CM

## 2023-08-31 DIAGNOSIS — G7281 Critical illness myopathy: Secondary | ICD-10-CM

## 2023-08-31 DIAGNOSIS — E876 Hypokalemia: Secondary | ICD-10-CM

## 2023-08-31 DIAGNOSIS — M810 Age-related osteoporosis without current pathological fracture: Secondary | ICD-10-CM

## 2023-08-31 DIAGNOSIS — K862 Cyst of pancreas: Secondary | ICD-10-CM | POA: Diagnosis not present

## 2023-08-31 NOTE — Progress Notes (Signed)
 Received home health orders orders from [facility]. Start of care 07/19/23.   Certification and orders from 07/19/23 through 09/16/23 are reviewed, signed and faxed back to home health company.  Need of intermittent skilled services at home: SN, PT, OT  The home health care plan has been established by me and will be reviewed and updated as needed to maximize patient recovery.  I certify that all home health services have been and will be furnished to the patient while under my care.  Face-to-face encounter in which the need for home health services was established: 07/18/23  Patient is receiving home health services for the following diagnoses: Problem List Items Addressed This Visit       Cardiovascular and Mediastinum   Aortic valve sclerosis (Chronic)   Atherosclerosis of both carotid arteries (Chronic)   Hypertensive heart disease without heart failure - Primary     Digestive   Autoimmune hepatitis (HCC)   Other Visit Diagnoses       Osteoporosis, post-menopausal [M81.0]         Pre-diabetes [R73.03]         Hypokalemia [E87.6]         Cyst of pancreas [K86.2]         Critical illness myopathy [G72.81]            Catheryn LOISE Slocumb, MD

## 2023-09-01 ENCOUNTER — Encounter: Payer: Self-pay | Admitting: Internal Medicine

## 2023-09-03 ENCOUNTER — Encounter: Payer: Self-pay | Admitting: Internal Medicine

## 2023-09-03 DIAGNOSIS — R251 Tremor, unspecified: Secondary | ICD-10-CM | POA: Insufficient documentation

## 2023-09-03 NOTE — Assessment & Plan Note (Addendum)
 Tremors likely due to prednisone . Blurred vision possibly related to prednisone , though correctable with glasses. Tapering prednisone  may alleviate side effects. - Continue prednisone  taper. - Monitor for improvement in tremors and vision as prednisone  dose decreases. - Will refer her to Neurology if her symptoms persist.

## 2023-09-03 NOTE — Assessment & Plan Note (Signed)
 Managed with valsartan  and amlodipine . Valsartan  recently restarted, no issues thus far. Blood pressure improved but some elevated readings persist. - Continue valsartan . - Monitor blood pressure readings and send them to the office. - Schedule a nurse visit for blood pressure check on September 2nd.

## 2023-09-03 NOTE — Assessment & Plan Note (Signed)
 Impaired glucose tolerance likely due to prednisone . Current A1c unknown, previous A1c was elevated. Metformin  managing blood sugar. - Obtain A1c test today. - Continue metformin 

## 2023-09-03 NOTE — Assessment & Plan Note (Signed)
 Liver function improving with decreased ALT levels. Prednisone  taper ongoing at 4 mg. Liver specialist coordinating lab tests. - Obtain liver lab test today. - Continue prednisone  taper as directed by liver specialist.

## 2023-09-03 NOTE — Assessment & Plan Note (Signed)
 She is encouraged to aim for at least 150 minutes of exercise per week.

## 2023-09-06 ENCOUNTER — Other Ambulatory Visit: Payer: Self-pay

## 2023-09-06 ENCOUNTER — Ambulatory Visit

## 2023-09-06 ENCOUNTER — Other Ambulatory Visit: Payer: Self-pay | Admitting: Internal Medicine

## 2023-09-06 VITALS — BP 120/80 | HR 69 | Ht 63.0 in | Wt 175.0 lb

## 2023-09-06 DIAGNOSIS — I119 Hypertensive heart disease without heart failure: Secondary | ICD-10-CM

## 2023-09-06 LAB — BMP8+EGFR
BUN/Creatinine Ratio: 21 (ref 12–28)
BUN: 17 mg/dL (ref 8–27)
CO2: 20 mmol/L (ref 20–29)
Calcium: 9.2 mg/dL (ref 8.7–10.3)
Chloride: 107 mmol/L — ABNORMAL HIGH (ref 96–106)
Creatinine, Ser: 0.8 mg/dL (ref 0.57–1.00)
Glucose: 142 mg/dL — ABNORMAL HIGH (ref 70–99)
Potassium: 3.7 mmol/L (ref 3.5–5.2)
Sodium: 141 mmol/L (ref 134–144)
eGFR: 75 mL/min/1.73 (ref >59)

## 2023-09-06 NOTE — Progress Notes (Unsigned)
 Patient presents today for a bpc. Patient reports compliance with her meds. Patient reports she takes Amlodipine  5MG  at night & Valsartan  160MG  in the morning. Denies headache, chest pain & sob.  BP Readings from Last 3 Encounters:  09/06/23 120/80  08/29/23 110/70  08/25/23 120/60  Patient will follow up at upcoming visit on 09/21/23.

## 2023-09-06 NOTE — Patient Instructions (Signed)
 Hypertension, Adult Hypertension is another name for high blood pressure. High blood pressure forces your heart to work harder to pump blood. This can cause problems over time. There are two numbers in a blood pressure reading. There is a top number (systolic) over a bottom number (diastolic). It is best to have a blood pressure that is below 120/80. What are the causes? The cause of this condition is not known. Some other conditions can lead to high blood pressure. What increases the risk? Some lifestyle factors can make you more likely to develop high blood pressure: Smoking. Not getting enough exercise or physical activity. Being overweight. Having too much fat, sugar, calories, or salt (sodium) in your diet. Drinking too much alcohol. Other risk factors include: Having any of these conditions: Heart disease. Diabetes. High cholesterol. Kidney disease. Obstructive sleep apnea. Having a family history of high blood pressure and high cholesterol. Age. The risk increases with age. Stress. What are the signs or symptoms? High blood pressure may not cause symptoms. Very high blood pressure (hypertensive crisis) may cause: Headache. Fast or uneven heartbeats (palpitations). Shortness of breath. Nosebleed. Vomiting or feeling like you may vomit (nauseous). Changes in how you see. Very bad chest pain. Feeling dizzy. Seizures. How is this treated? This condition is treated by making healthy lifestyle changes, such as: Eating healthy foods. Exercising more. Drinking less alcohol. Your doctor may prescribe medicine if lifestyle changes do not help enough and if: Your top number is above 130. Your bottom number is above 80. Your personal target blood pressure may vary. Follow these instructions at home: Eating and drinking  If told, follow the DASH eating plan. To follow this plan: Fill one half of your plate at each meal with fruits and vegetables. Fill one fourth of your plate  at each meal with whole grains. Whole grains include whole-wheat pasta, brown rice, and whole-grain bread. Eat or drink low-fat dairy products, such as skim milk or low-fat yogurt. Fill one fourth of your plate at each meal with low-fat (lean) proteins. Low-fat proteins include fish, chicken without skin, eggs, beans, and tofu. Avoid fatty meat, cured and processed meat, or chicken with skin. Avoid pre-made or processed food. Limit the amount of salt in your diet to less than 1,500 mg each day. Do not drink alcohol if: Your doctor tells you not to drink. You are pregnant, may be pregnant, or are planning to become pregnant. If you drink alcohol: Limit how much you have to: 0-1 drink a day for women. 0-2 drinks a day for men. Know how much alcohol is in your drink. In the [place] one drink equals one 12 oz bottle of beer (355 mL), one 5 oz glass of wine (148 mL), or one 1 oz glass of hard liquor (44 mL). Lifestyle  Work with your doctor to stay at a healthy weight or to lose weight. Ask your doctor what the best weight is for you. Get at least 30 minutes of exercise that causes your heart to beat faster (aerobic exercise) most days of the week. This may include walking, swimming, or biking. Get at least 30 minutes of exercise that strengthens your muscles (resistance exercise) at least 3 days a week. This may include lifting weights or doing Pilates. Do not smoke or use any products that contain nicotine or tobacco. If you need help quitting, ask your doctor. Check your blood pressure at home as told by your doctor. Keep all follow-up visits. Medicines Take over-the-counter and prescription medicines  only as told by your doctor. Follow directions carefully. Do not skip doses of blood pressure medicine. The medicine does not work as well if you skip doses. Skipping doses also puts you at risk for problems. Ask your doctor about side effects or reactions to medicines that you should watch  for. Contact a doctor if: You think you are having a reaction to the medicine you are taking. You have headaches that keep coming back. You feel dizzy. You have swelling in your ankles. You have trouble with your vision. Get help right away if: You get a very bad headache. You start to feel mixed up (confused). You feel weak or numb. You feel faint. You have very bad pain in your: Chest. Belly (abdomen). You vomit more than once. You have trouble breathing. These symptoms may be an emergency. Get help right away. Call 911. Do not wait to see if the symptoms will go away. Do not drive yourself to the hospital. Summary Hypertension is another name for high blood pressure. High blood pressure forces your heart to work harder to pump blood. For most people, a normal blood pressure is less than 120/80. Making healthy choices can help lower blood pressure. If your blood pressure does not get lower with healthy choices, you may need to take medicine. This information is not intended to replace advice given to you by your health care provider. Make sure you discuss any questions you have with your health care provider. Document Revised: 10/09/2020 Document Reviewed: 10/09/2020 Elsevier Patient Education  2024 [employer].

## 2023-09-07 ENCOUNTER — Ambulatory Visit: Payer: Self-pay | Admitting: Internal Medicine

## 2023-09-07 ENCOUNTER — Telehealth: Payer: Self-pay | Admitting: Gastroenterology

## 2023-09-07 DIAGNOSIS — K754 Autoimmune hepatitis: Secondary | ICD-10-CM | POA: Diagnosis not present

## 2023-09-07 NOTE — Telephone Encounter (Addendum)
 Procedure:Upper EUS Procedure date: 09/15/23 Procedure location: [facility] Arrival Time: 11:45 am Spoke with the patient Y/N: Yes Any prep concerns? No  Has the patient obtained the prep from the pharmacy ? No prep needed Do you have a care partner and transportation: Yes Any additional concerns? No

## 2023-09-09 ENCOUNTER — Encounter (HOSPITAL_COMMUNITY): Payer: Self-pay | Admitting: Gastroenterology

## 2023-09-13 ENCOUNTER — Encounter: Payer: Self-pay | Admitting: Internal Medicine

## 2023-09-15 ENCOUNTER — Telehealth: Payer: Self-pay | Admitting: Internal Medicine

## 2023-09-15 ENCOUNTER — Ambulatory Visit (HOSPITAL_COMMUNITY)
Admission: RE | Admit: 2023-09-15 | Discharge: 2023-09-15 | Disposition: A | Discharge status: Home / Self Care | Attending: Gastroenterology | Admitting: Gastroenterology

## 2023-09-15 ENCOUNTER — Ambulatory Visit (HOSPITAL_COMMUNITY): Admitting: Anesthesiology

## 2023-09-15 ENCOUNTER — Other Ambulatory Visit: Payer: Self-pay

## 2023-09-15 ENCOUNTER — Encounter (HOSPITAL_COMMUNITY)
Admission: RE | Disposition: A | Discharge status: Home / Self Care | Payer: Self-pay | Source: Home / Self Care | Attending: Gastroenterology

## 2023-09-15 ENCOUNTER — Telehealth: Payer: Self-pay | Admitting: Gastroenterology

## 2023-09-15 ENCOUNTER — Encounter (HOSPITAL_COMMUNITY): Payer: Self-pay | Admitting: Gastroenterology

## 2023-09-15 DIAGNOSIS — K862 Cyst of pancreas: Secondary | ICD-10-CM

## 2023-09-15 DIAGNOSIS — I358 Other nonrheumatic aortic valve disorders: Secondary | ICD-10-CM

## 2023-09-15 DIAGNOSIS — K8689 Other specified diseases of pancreas: Secondary | ICD-10-CM | POA: Diagnosis not present

## 2023-09-15 DIAGNOSIS — K2950 Unspecified chronic gastritis without bleeding: Secondary | ICD-10-CM | POA: Diagnosis not present

## 2023-09-15 DIAGNOSIS — E7800 Pure hypercholesterolemia, unspecified: Secondary | ICD-10-CM | POA: Diagnosis not present

## 2023-09-15 DIAGNOSIS — I8500 Esophageal varices without bleeding: Secondary | ICD-10-CM | POA: Diagnosis not present

## 2023-09-15 DIAGNOSIS — K838 Other specified diseases of biliary tract: Secondary | ICD-10-CM

## 2023-09-15 DIAGNOSIS — I080 Rheumatic disorders of both mitral and aortic valves: Secondary | ICD-10-CM | POA: Insufficient documentation

## 2023-09-15 DIAGNOSIS — K2289 Other specified disease of esophagus: Secondary | ICD-10-CM | POA: Insufficient documentation

## 2023-09-15 DIAGNOSIS — I899 Noninfective disorder of lymphatic vessels and lymph nodes, unspecified: Secondary | ICD-10-CM

## 2023-09-15 DIAGNOSIS — K449 Diaphragmatic hernia without obstruction or gangrene: Secondary | ICD-10-CM | POA: Diagnosis not present

## 2023-09-15 DIAGNOSIS — K2970 Gastritis, unspecified, without bleeding: Secondary | ICD-10-CM

## 2023-09-15 DIAGNOSIS — I10 Essential (primary) hypertension: Secondary | ICD-10-CM

## 2023-09-15 HISTORY — PX: FINE NEEDLE ASPIRATION: SHX6590

## 2023-09-15 HISTORY — PX: EUS: SHX5427

## 2023-09-15 HISTORY — PX: ESOPHAGOGASTRODUODENOSCOPY: SHX5428

## 2023-09-15 LAB — GLUCOSE, CAPILLARY: Glucose-Capillary: 90 mg/dL (ref 70–99)

## 2023-09-15 SURGERY — ULTRASOUND, UPPER GI TRACT, ENDOSCOPIC
Anesthesia: Monitor Anesthesia Care

## 2023-09-15 MED ORDER — SODIUM CHLORIDE 0.9 % IV SOLN: INTRAVENOUS | Status: DC | PRN

## 2023-09-15 MED ORDER — PANTOPRAZOLE SODIUM 40 MG PO TBEC: 40.0000 mg | DELAYED_RELEASE_TABLET | Freq: Every day | ORAL | 3 refills | Status: AC

## 2023-09-15 MED ORDER — MIDAZOLAM HCL 2 MG/2ML IJ SOLN
INTRAMUSCULAR | Status: AC
  Filled 2023-09-15: qty 2

## 2023-09-15 MED ORDER — SODIUM CHLORIDE 0.9 % IV SOLN: INTRAVENOUS | Status: DC

## 2023-09-15 MED ORDER — PROPOFOL 1000 MG/100ML IV EMUL
INTRAVENOUS | Status: AC
  Filled 2023-09-15: qty 100

## 2023-09-15 MED ORDER — FENTANYL CITRATE (PF) 250 MCG/5ML IJ SOLN
INTRAMUSCULAR | Status: AC
  Filled 2023-09-15: qty 5

## 2023-09-15 MED ORDER — CIPROFLOXACIN IN D5W 400 MG/200ML IV SOLN
INTRAVENOUS | Status: DC | PRN
  Administered 2023-09-15: 400 mg via INTRAVENOUS

## 2023-09-15 MED ORDER — CIPROFLOXACIN IN D5W 400 MG/200ML IV SOLN
INTRAVENOUS | Status: AC
  Filled 2023-09-15: qty 200

## 2023-09-15 MED ORDER — FENTANYL CITRATE (PF) 250 MCG/5ML IJ SOLN
INTRAMUSCULAR | Status: DC | PRN
  Administered 2023-09-15: 50 ug via INTRAVENOUS

## 2023-09-15 MED ORDER — LIDOCAINE 2% (20 MG/ML) 5 ML SYRINGE
INTRAMUSCULAR | Status: DC | PRN
  Administered 2023-09-15: 100 mg via INTRAVENOUS

## 2023-09-15 MED ORDER — CIPROFLOXACIN HCL 500 MG PO TABS: 500.0000 mg | ORAL_TABLET | Freq: Two times a day (BID) | ORAL | 0 refills | Status: DC

## 2023-09-15 MED ORDER — PROPOFOL 10 MG/ML IV BOLUS
INTRAVENOUS | Status: DC | PRN
  Administered 2023-09-15: 50 mg via INTRAVENOUS
  Administered 2023-09-15: 75 ug/kg/min via INTRAVENOUS

## 2023-09-15 NOTE — Anesthesia Procedure Notes (Signed)
 Procedure Name: MAC Date/Time: 09/15/2023 12:42 PM  Performed by: Obadiah Reyes BROCKS, CRNAPre-anesthesia Checklist: Patient identified, Emergency Drugs available, Suction available, Patient being monitored and Timeout performed Oxygen Delivery Method: Simple face mask Preoxygenation: Pre-oxygenation with 100% oxygen

## 2023-09-15 NOTE — Telephone Encounter (Signed)
 Inbound call from patient husband stating that Dr. Wilhelmenia was going to call in a prescription for her and they still have not received a call from the pharmacy or any notification that states his wife has a medication for her at the pharmacy. Patient husband is requesting a call back. Please advise.

## 2023-09-15 NOTE — Telephone Encounter (Signed)
See other note for details.

## 2023-09-15 NOTE — Telephone Encounter (Signed)
 Received a phone call to the [facility] on call pager. Patient's daughter Lindwood Cave called because the ciprofloxacin  medication that was recommended by Dr. Wilhelmenia after her mother's EUS procedure was not sent to the pharmacy. She thinks that perhaps the pantoprazole  was refilled by mistake instead. I sent in the ciprofloxacin  500 mg BID for 3 days that was recommended in the EUS procedure report from today to the patient's pharmacy. Will CC Dr. Wilhelmenia to this telephone note.

## 2023-09-15 NOTE — Op Note (Signed)
 [facility] Patient Name: Marissa Orozco Procedure Date: 09/15/2023 MRN: [id number] Attending MD: Aloha Finner , MD, [id number] Date of Birth: 1945-07-03 CSN: [id number] Age: 78 Admit Type: Outpatient Procedure:                Upper EUS Indications:              Pancreatic cyst on MRCP Providers:                Aloha Finner, MD, Robie Breed, RN,                            Lorrayne Kitty, Technician Referring MD:              Medicines:                Monitored Anesthesia Care Complications:            No immediate complications. Estimated Blood Loss:     Estimated blood loss was minimal. Procedure:                Pre-Anesthesia Assessment:                           - Prior to the procedure, a History and Physical                            was performed, and patient medications and                            allergies were reviewed. The patient's tolerance of                            previous anesthesia was also reviewed. The risks                            and benefits of the procedure and the sedation                            options and risks were discussed with the patient.                            All questions were answered, and informed consent                            was obtained. Prior Anticoagulants: The patient has                            taken no anticoagulant or antiplatelet agents                            except for aspirin . ASA Grade Assessment: III - A                            patient with severe systemic disease. After  reviewing the risks and benefits, the patient was                            deemed in satisfactory condition to undergo the                            procedure.                           After obtaining informed consent, the endoscope was                            passed under direct vision. Throughout the                            procedure, the patient's blood  pressure, pulse, and                            oxygen saturations were monitored continuously. The                            [id number]-[id number] ([id number]) Olympus endoscope was introduced                            through the mouth, and advanced to the second part                            of duodenum. The [id number]-[id number] ([id number]) Olympus                            duodenoscope was introduced through the mouth, and                            advanced to the area of papilla. The [id number]                            ([id number]) Olympus endosonoscope was introduced                            through the mouth, and advanced to the duodenum for                            ultrasound examination from the stomach and                            duodenum. The upper EUS was accomplished without                            difficulty. The patient tolerated the procedure. Scope In: Scope Out: Findings:      ENDOSCOPIC FINDING: :      No gross lesions were noted in the proximal esophagus and in the mid       esophagus.      Grade I varices were found in the distal esophagus.  The Z-line was irregular and was found 37 cm from the incisors.      A 3 cm hiatal hernia was present.      Patchy mildly erythematous mucosa without bleeding was found in the       entire examined stomach. Biopsies were taken with a cold forceps for       histology and Helicobacter pylori testing.      No gross lesions were noted in the duodenal bulb, in the first portion       of the duodenum and in the second portion of the duodenum.      The major papilla was normal.      ENDOSONOGRAPHIC FINDING: :      An anechoic, multicystic and shadowing lesion suggestive of a cyst was       identified in the pancreatic head/uncinate process of the pancreas. It       appears to have communication with the pancreatic duct. The lesion       measured 75 mm by 35 mm in maximal cross-sectional diameter. There were       many compartments thinly  septated. The outer wall of the lesion was       thin. There was no associated mass. There was internal debris within the       fluid-filled cavity. Diagnostic needle aspiration for fluid was       performed. Color Doppler imaging was utilized prior to needle puncture       to confirm a lack of significant vascular structures within the needle       path. One pass was made with the 22 gauge needle using a transduodenal       approach. A stylet was used. The amount of fluid collected was 20 mL.       The fluid was cloudy, serosanguinous and watery. Sample(s) were sent for       chemistry, amylase concentration, cytology and CEA.      Pancreatic parenchymal abnormalities were noted in the entire pancreas.       These consisted of hyperechoic strands.      Endosonographic imaging in the visualized portion of the liver showed no       mass.      There was dilation in the common bile duct and in the common hepatic       duct.      Endosonographic imaging of the ampulla showed no intramural       (subepithelial) lesion.      No malignant-appearing lymph nodes were visualized in the celiac region       (level 20), peripancreatic region and porta hepatis region.      The celiac region was visualized. Impression:               EGD impression:                           - No gross lesions in the proximal esophagus and in                            the mid esophagus. Grade I esophageal varices noted                            distally. Z-line irregular, 37 cm from the incisors.                           -  3 cm hiatal hernia.                           - Erythematous mucosa in the stomach. Biopsied.                           - No gross lesions in the duodenal bulb, in the                            first portion of the duodenum and in the second                            portion of the duodenum.                           - Normal major papilla.                           EUS impression:                            - A cystic lesion was seen in the pancreatic                            head/uncinate process. Cytology results are                            pending. However, the endosonographic appearance is                            suggestive of an intraductal papillary mucinous                            neoplasm. Fine needle aspiration for fluid                            performed.                           - Pancreatic parenchymal abnormalities consisting                            of hyperechoic strands were noted in the entire                            pancreas.                           - There was dilation in the common bile duct and in                            the common hepatic duct. The gallbladder was                            unremarkable.                           -  No malignant-appearing lymph nodes were                            visualized in the celiac region (level 20),                            peripancreatic region and porta hepatis region. Moderate Sedation:      Not Applicable - Patient had care per Anesthesia. Recommendation:           - The patient will be observed post-procedure,                            until all discharge criteria are met.                           - Discharge patient to home.                           - Patient has a contact number available for                            emergencies. The signs and symptoms of potential                            delayed complications were discussed with the                            patient. Return to normal activities tomorrow.                            Written discharge instructions were provided to the                            patient.                           - Low fat diet.                           - Observe patient's clinical course.                           - Ciprofloxacin  500 mg twice daily for 3 days to                            decrease risk of post interventional infection.                            - Will discuss with [facility]ology, it is                            reasonable to consider potential initiation of                            beta-blockade in the setting of her grade 1  esophageal varices, if no issues we can work on                            scheduling/ordering..                           - Await cytology results and await path results.                           - Pending results, will determine what next steps                            in evaluation/follow-up will be. If this is a IPMN,                            it is possible based on the size, we would have the                            patient evaluated for consideration of surgical                            options (but with her underlying autoimmune                            hepatitis and what appears to be portal                            hypertensive changes with small esophageal varices                            in the setting of her AIH, this may be problematic                            where in which we would be monitoring/surveilling                            over time). Time will tell.                           - The findings and recommendations were discussed                            with the patient.                           - The findings and recommendations were discussed                            with the designated responsible adult. Procedure Code(s):        --- Professional ---                           [phone], Esophagogastroduodenoscopy, flexible,  transoral; with transendoscopic ultrasound-guided                            intramural or transmural fine needle                            aspiration/biopsy(s), (includes endoscopic                            ultrasound examination limited to the esophagus,                            stomach or duodenum, and adjacent structures)                           43239, 59,  Esophagogastroduodenoscopy, flexible,                            transoral; with biopsy, single or multiple Diagnosis Code(s):        --- Professional ---                           I85.00, Esophageal varices without bleeding                           K22.89, Other specified disease of esophagus                           K44.9, Diaphragmatic hernia without obstruction or                            gangrene                           K31.89, Other diseases of stomach and duodenum                           K86.2, Cyst of pancreas                           K86.9, Disease of pancreas, unspecified                           I89.9, Noninfective disorder of lymphatic vessels                            and lymph nodes, unspecified                           K83.8, Other specified diseases of biliary tract CPT copyright 2022 American [employer] Association. All rights reserved. The codes documented in this report are preliminary and upon coder review may  be revised to meet current compliance requirements. Aloha Finner, MD 09/15/2023 1:43:42 PM Number of Addenda: 0

## 2023-09-15 NOTE — Anesthesia Preprocedure Evaluation (Addendum)
 Anesthesia Evaluation  Patient identified by MRN, date of birth, ID band Patient awake    Reviewed: Allergy & Precautions, NPO status , Patient's Chart, lab work & pertinent test results  Airway Mallampati: II  TM Distance: >3 FB Neck ROM: Full    Dental no notable dental hx.    Pulmonary sleep apnea    Pulmonary exam normal breath sounds clear to auscultation       Cardiovascular hypertension, Pt. on medications Normal cardiovascular exam+ Valvular Problems/Murmurs AI and MR  Rhythm:Regular Rate:Normal  Echo 11/2022  1. Left ventricular ejection fraction, by estimation, is 65 to 70%. The  left ventricle has normal function. The left ventricle has no regional  wall motion abnormalities. There is mild concentric left ventricular  hypertrophy. Left ventricular diastolic parameters are indeterminate.   2. Right ventricular systolic function is normal. The right ventricular  size is normal. There is normal pulmonary artery systolic pressure.   3. The mitral valve is normal in structure. Mild to moderate mitral valve  regurgitation. No evidence of mitral stenosis.   4. The aortic valve is normal in structure. Aortic valve regurgitation is  mild. Aortic valve sclerosis/calcification is present, without any  evidence of aortic stenosis.   5. The inferior vena cava is normal in size with greater than 50%  respiratory variability, suggesting right atrial pressure of 3 mmHg.      Neuro/Psych    GI/Hepatic ,GERD  ,,(+) Hepatitis -  Endo/Other  negative endocrine ROS    Renal/GU Renal disease     Musculoskeletal negative musculoskeletal ROS (+)    Abdominal   Peds  Hematology negative hematology ROS (+)   Anesthesia Other Findings   Reproductive/Obstetrics                              Anesthesia Physical Anesthesia Plan  ASA: 3  Anesthesia Plan: MAC   Post-op Pain Management:     Induction: Intravenous  PONV Risk Score and Plan: 2 and Propofol  infusion, TIVA and Treatment may vary due to age or medical condition  Airway Management Planned: Natural Airway  Additional Equipment:   Intra-op Plan:   Post-operative Plan:   Informed Consent: I have reviewed the patients History and Physical, chart, labs and discussed the procedure including the risks, benefits and alternatives for the proposed anesthesia with the patient or authorized representative who has indicated his/her understanding and acceptance.     Dental advisory given  Plan Discussed with: CRNA  Anesthesia Plan Comments:          Anesthesia Quick Evaluation

## 2023-09-15 NOTE — Discharge Instructions (Signed)

## 2023-09-15 NOTE — Transfer of Care (Signed)
 Immediate Anesthesia Transfer of Care Note  Patient: Marissa Orozco  Procedure(s) Performed: ULTRASOUND, UPPER GI TRACT, ENDOSCOPIC  Patient Location: PACU and Endoscopy Unit  Anesthesia Type:MAC  Level of Consciousness: awake, alert , and oriented  Airway & Oxygen Therapy: Patient Spontanous Breathing and Patient connected to nasal cannula oxygen  Post-op Assessment: Report given to RN and Post -op Vital signs reviewed and stable  Post vital signs: Reviewed and stable  Last Vitals:  Vitals Value Taken Time  BP    Temp    Pulse 76 09/15/23 13:29  Resp 29 09/15/23 13:29  SpO2 98 % 09/15/23 13:29  Vitals shown include unfiled device data.  Last Pain:  Vitals:   09/15/23 1201  TempSrc: Temporal  PainSc: 0-No pain         Complications: No notable events documented.

## 2023-09-15 NOTE — H&P (Signed)
 GASTROENTEROLOGY PROCEDURE H&P NOTE   Primary Care Physician: Jarold Medici, MD  HPI: Marissa Orozco is a 78 y.o. female who presents for EGD/EUS for pancreatic cyst evaluation in setting of AIH.  Past Medical History:  Diagnosis Date   Allergy 2000   Breast lump    Cataract    Colon polyps    Fibroids    GERD (gastroesophageal reflux disease)    Glaucoma    Heart murmur    Hyperlipidemia 01/05/1984   Hypertension 01/05/1984   Interstitial cystitis 01/04/1997   Kidney stones 01/05/2003   Neuromuscular disorder (HCC)    Osteoporosis    Preop cardiovascular exam 10/25/2022   Sleep apnea 2021   Tonsillitis    Past Surgical History:  Procedure Laterality Date   ABDOMINAL HYSTERECTOMY  1997   BREAST EXCISIONAL BIOPSY Right 07/27/2006   Intraductal Papilloma   BREAST SURGERY  2008   CATARACT EXTRACTION Bilateral 02/2020   other in 05/2020   COLONOSCOPY  2010   Dr Kristie   EYE SURGERY  2022   HYSTEROSCOPY  1993   IR FLUORO GUIDE CV LINE RIGHT  06/24/2023   IR REMOVAL TUN CV CATH W/O FL  07/04/2023   IR US  GUIDE VASC ACCESS RIGHT  06/24/2023   LAPAROSCOPIC HYSTERECTOMY  1994   MYOMECTOMY  1993   excision of tumor   POLYPECTOMY     TONSILLECTOMY  1953   TUBAL LIGATION  1980   Current Facility-Administered Medications  Medication Dose Route Frequency Provider Last Rate Last Admin   0.9 %  sodium chloride  infusion   Intravenous Continuous Mansouraty, Aloha Raddle., MD        Current Facility-Administered Medications:    0.9 %  sodium chloride  infusion, , Intravenous, Continuous, Mansouraty, Aloha Raddle., MD Allergies  Allergen Reactions   Hydrochlorothiazide Itching   Family History  Problem Relation Age of Onset   Hypertension Mother    Heart disease Mother    Colon cancer Mother 92   Arthritis Mother    Cancer Mother    Coronary artery disease Father        mother, PGM   Stroke Father    Hypertension Father    Colon cancer Father 25   Cancer Father     Colon cancer Maternal Grandmother    Diabetes Other    Breast cancer Neg Hx    Colon polyps Neg Hx    Esophageal cancer Neg Hx    Stomach cancer Neg Hx    Rectal cancer Neg Hx    BRCA 1/2 Neg Hx    Social History   Socioeconomic History   Marital status: Married    Spouse name: Not on file   Number of children: 2   Years of education: Not on file   Highest education level: Doctorate  Occupational History   Occupation: retired   Occupation: retired  Tobacco Use   Smoking status: Never   Smokeless tobacco: Never  Vaping Use   Vaping status: Never Used  Substance and Sexual Activity   Alcohol use: Yes    Alcohol/week: 5.0 standard drinks of alcohol    Types: 5 Glasses of wine per week   Drug use: No   Sexual activity: Not Currently  Other Topics Concern   Not on file  Social History Narrative   Not on file   Social Drivers of Health   Financial Resource Strain: Low Risk  (02/12/2023)   Overall Financial Resource Strain (CARDIA)    Difficulty  of Paying Living Expenses: Not hard at all  Food Insecurity: No Food Insecurity (08/25/2023)   Hunger Vital Sign    Worried About Running Out of Food in the Last Year: Never true    Ran Out of Food in the Last Year: Never true  Transportation Needs: No Transportation Needs (08/25/2023)   PRAPARE - Administrator, Civil Service (Medical): No    Lack of Transportation (Non-Medical): No  Physical Activity: Sufficiently Active (08/25/2023)   Exercise Vital Sign    Days of Exercise per Week: 4 days    Minutes of Exercise per Session: 40 min  Stress: No Stress Concern Present (08/25/2023)   [employer] of Occupational Health - Occupational Stress Questionnaire    Feeling of Stress: Not at all  Social Connections: Moderately Isolated (08/25/2023)   Social Connection and Isolation Panel    Frequency of Communication with Friends and Family: Three times a week    Frequency of Social Gatherings with Friends and Family:  Twice a week    Attends Religious Services: Never    Database administrator or Organizations: No    Attends Engineer, structural: Not on file    Marital Status: Married  Catering manager Violence: Not At Risk (06/17/2023)   Humiliation, Afraid, Rape, and Kick questionnaire    Fear of Current or Ex-Partner: No    Emotionally Abused: No    Physically Abused: No    Sexually Abused: No    Physical Exam: Today's Vitals   09/15/23 1201  BP: (!) 164/88  Pulse: 83  Resp: (!) 25  Temp: 97.7 F (36.5 C)  TempSrc: Temporal  SpO2: 97%  Weight: 76.7 kg  Height: 5' 3 (1.6 m)  PainSc: 0-No pain   Body mass index is 29.94 kg/m. GEN: NAD EYE: Sclerae anicteric ENT: MMM CV: Non-tachycardic GI: Soft, NT/ND NEURO:  Alert & Oriented x 3  Lab Results: No results for input(s): WBC, HGB, HCT, PLT in the last 72 hours. BMET No results for input(s): NA, K, CL, CO2, GLUCOSE, BUN, CREATININE, CALCIUM  in the last 72 hours. LFT No results for input(s): PROT, ALBUMIN, AST, ALT, ALKPHOS, BILITOT, BILIDIR, IBILI in the last 72 hours. PT/INR No results for input(s): LABPROT, INR in the last 72 hours.   Impression / Plan: This is a 78 y.o.female who presents for EGD/EUS for pancreatic cyst evaluation in setting of AIH.  The risks of an EUS including intestinal perforation, bleeding, infection, aspiration, and medication effects were discussed as was the possibility it may not give a definitive diagnosis if a biopsy is performed.  When a biopsy of the pancreas is done as part of the EUS, there is an additional risk of pancreatitis at the rate of about 1-2%.  It was explained that procedure related pancreatitis is typically mild, although it can be severe and even life threatening, which is why we do not perform random pancreatic biopsies and only biopsy a lesion/area we feel is concerning enough to warrant the risk.   The risks and benefits of  endoscopic evaluation/treatment were discussed with the patient and/or family; these include but are not limited to the risk of perforation, infection, bleeding, missed lesions, lack of diagnosis, severe illness requiring hospitalization, as well as anesthesia and sedation related illnesses.  The patient's history has been reviewed, patient examined, no change in status, and deemed stable for procedure.  The patient and/or family is agreeable to proceed.    Aloha Finner, MD [facility] Gastroenterology  Advanced Endoscopy Office # [phone]

## 2023-09-16 LAB — SURGICAL PATHOLOGY

## 2023-09-16 NOTE — Telephone Encounter (Signed)
 Attempted to call pt's spouse back. No answer. Cipro  medication was sent to [facility] on [address] yesterday by Dr. Federico. Left VM for return call.

## 2023-09-18 ENCOUNTER — Encounter (HOSPITAL_COMMUNITY): Payer: Self-pay | Admitting: Gastroenterology

## 2023-09-19 LAB — CYTOLOGY - NON PAP

## 2023-09-19 NOTE — Anesthesia Postprocedure Evaluation (Signed)
 Anesthesia Post Note  Patient: Marissa Orozco  Procedure(s) Performed: ULTRASOUND, UPPER GI TRACT, ENDOSCOPIC EGD (ESOPHAGOGASTRODUODENOSCOPY) FINE NEEDLE ASPIRATION     Patient location during evaluation: Endoscopy Anesthesia Type: MAC Level of consciousness: awake and alert Pain management: pain level controlled Vital Signs Assessment: post-procedure vital signs reviewed and stable Respiratory status: spontaneous breathing Cardiovascular status: stable Anesthetic complications: no   No notable events documented.  Last Vitals:  Vitals:   09/15/23 1350 09/15/23 1400  BP: (!) 146/96 (!) 145/73  Pulse: 80 (!) 154  Resp: 18 (!) 21  Temp:    SpO2: 99% 97%    Last Pain:  Vitals:   09/15/23 1400  TempSrc:   PainSc: 0-No pain                 Norleen Pope

## 2023-09-20 ENCOUNTER — Ambulatory Visit: Payer: Self-pay | Admitting: Gastroenterology

## 2023-09-21 ENCOUNTER — Encounter: Payer: Self-pay | Admitting: Internal Medicine

## 2023-09-21 ENCOUNTER — Ambulatory Visit (INDEPENDENT_AMBULATORY_CARE_PROVIDER_SITE_OTHER)

## 2023-09-21 ENCOUNTER — Ambulatory Visit: Payer: Self-pay | Admitting: Internal Medicine

## 2023-09-21 VITALS — BP 142/80 | HR 89 | Temp 98.4°F | Ht 63.0 in | Wt 170.0 lb

## 2023-09-21 VITALS — BP 150/80 | HR 89 | Temp 98.4°F | Ht 63.0 in | Wt 170.0 lb

## 2023-09-21 DIAGNOSIS — E6609 Other obesity due to excess calories: Secondary | ICD-10-CM | POA: Diagnosis not present

## 2023-09-21 DIAGNOSIS — I6523 Occlusion and stenosis of bilateral carotid arteries: Secondary | ICD-10-CM | POA: Diagnosis not present

## 2023-09-21 DIAGNOSIS — N182 Chronic kidney disease, stage 2 (mild): Secondary | ICD-10-CM | POA: Diagnosis not present

## 2023-09-21 DIAGNOSIS — Z0000 Encounter for general adult medical examination without abnormal findings: Secondary | ICD-10-CM | POA: Diagnosis not present

## 2023-09-21 DIAGNOSIS — I119 Hypertensive heart disease without heart failure: Secondary | ICD-10-CM

## 2023-09-21 DIAGNOSIS — I1310 Hypertensive heart and chronic kidney disease without heart failure, with stage 1 through stage 4 chronic kidney disease, or unspecified chronic kidney disease: Secondary | ICD-10-CM | POA: Diagnosis not present

## 2023-09-21 DIAGNOSIS — Z6830 Body mass index (BMI) 30.0-30.9, adult: Secondary | ICD-10-CM | POA: Diagnosis not present

## 2023-09-21 DIAGNOSIS — E66811 Obesity, class 1: Secondary | ICD-10-CM | POA: Diagnosis not present

## 2023-09-21 DIAGNOSIS — R7303 Prediabetes: Secondary | ICD-10-CM

## 2023-09-21 DIAGNOSIS — E876 Hypokalemia: Secondary | ICD-10-CM

## 2023-09-21 DIAGNOSIS — K754 Autoimmune hepatitis: Secondary | ICD-10-CM | POA: Diagnosis not present

## 2023-09-21 MED ORDER — SPIRONOLACTONE 25 MG PO TABS: ORAL_TABLET | ORAL | 0 refills | Status: DC

## 2023-09-21 NOTE — Patient Instructions (Signed)
 Marissa Orozco,  Thank you for taking the time for your Medicare Wellness Visit. I appreciate your continued commitment to your health goals. Please review the care plan we discussed, and feel free to reach out if I can assist you further.  Medicare recommends these wellness visits once per year to help you and your care team stay ahead of potential health issues. These visits are designed to focus on prevention, allowing your provider to concentrate on managing your acute and chronic conditions during your regular appointments.  Please note that Annual Wellness Visits do not include a physical exam. Some assessments may be limited, especially if the visit was conducted virtually. If needed, we may recommend a separate in-person follow-up with your provider.  Ongoing Care Seeing your primary care provider every 3 to 6 months helps us  monitor your health and provide consistent, personalized care.   Referrals If a referral was made during today's visit and you haven't received any updates within two weeks, please contact the referred provider directly to check on the status.  Recommended Screenings:  Health Maintenance  Topic Date Due   COVID-19 Vaccine (3 - Moderna risk series) 10/07/2023*   Flu Shot  04/03/2024*   Medicare Annual Wellness Visit  09/20/2024   Colon Cancer Screening  02/02/2028   DTaP/Tdap/Td vaccine (3 - Td or Tdap) 06/14/2032   Pneumococcal Vaccine for age over 33  Completed   DEXA scan (bone density measurement)  Completed   Hepatitis C Screening  Completed   Zoster (Shingles) Vaccine  Completed   HPV Vaccine  Aged Out   Meningitis B Vaccine  Aged Out   Breast Cancer Screening  Discontinued  *Topic was postponed. The date shown is not the original due date.       09/21/2023    8:29 AM  Advanced Directives  Does Patient Have a Medical Advance Directive? Yes  Type of Estate agent of [place];Living will  Copy of Healthcare Power of Attorney in  Chart? No - copy requested   Advance Care Planning is important because it: Ensures you receive medical care that aligns with your values, goals, and preferences. Provides guidance to your family and loved ones, reducing the emotional burden of decision-making during critical moments.  Vision: Annual vision screenings are recommended for early detection of glaucoma, cataracts, and diabetic retinopathy. These exams can also reveal signs of chronic conditions such as diabetes and high blood pressure.  Dental: Annual dental screenings help detect early signs of oral cancer, gum disease, and other conditions linked to overall health, including heart disease and diabetes.  Please see the attached documents for additional preventive care recommendations.

## 2023-09-21 NOTE — Progress Notes (Signed)
 Subjective:   Marissa Orozco is a 78 y.o. who presents for a Medicare Wellness preventive visit.  As a reminder, Annual Wellness Visits don't include a physical exam, and some assessments may be limited, especially if this visit is performed virtually. We may recommend an in-person follow-up visit with your provider if needed.  Visit Complete: In person    Persons Participating in Visit: Patient.  AWV Questionnaire: Yes: Patient Medicare AWV questionnaire was completed by the patient on 09/19/2023; I have confirmed that all information answered by patient is correct and no changes since this date.  Cardiac Risk Factors include: advanced age (>28men, >18 women);hypertension;obesity (BMI >30kg/m2)     Objective:    Today's Vitals   09/21/23 0822 09/21/23 0835  BP: (!) 170/90 (!) 150/80  Pulse: 89   Temp: 98.4 F (36.9 C)   TempSrc: Oral   SpO2: 96%   Weight: 170 lb (77.1 kg)   Height: 5' 3 (1.6 m)    Body mass index is 30.11 kg/m.     09/21/2023    8:29 AM 09/15/2023   11:56 AM 07/07/2023    1:36 PM 06/17/2023    5:57 PM 06/17/2023    5:51 PM 09/15/2022    8:26 AM 11/12/2021    8:31 PM  Advanced Directives  Does Patient Have a Medical Advance Directive? Yes Yes No  No Yes No  Type of Estate agent of [place];Living will Living will;Healthcare Power of [place] Power of [place];Living will   Copy of Healthcare Power of Attorney in Chart? No - copy requested No - copy requested    No - copy requested   Would patient like information on creating a medical advance directive?   No - Patient declined No - Patient declined   No - Patient declined    Current Medications (verified) Outpatient Encounter Medications as of 09/21/2023  Medication Sig   amLODipine  (NORVASC ) 5 MG tablet TAKE 1 TABLET BY MOUTH DAILY   ascorbic acid  (VITAMIN C ) 1000 MG tablet Take 1 tablet (1,000 mg total) by mouth daily.   aspirin  EC 81 MG tablet Take 1 tablet (81  mg total) by mouth daily. Swallow whole.   Calcium  Carbonate (CALCIUM  500 PO) Take 500 mg by mouth daily with breakfast.   Cholecalciferol (VITAMIN D3) 50 MCG (2000 UT) TABS Take 2,000 Units by mouth daily with breakfast.   dorzolamide -timolol  (COSOPT ) 22.3-6.8 MG/ML ophthalmic solution Place 1 drop into both eyes in the morning and at bedtime.   latanoprost  (XALATAN ) 0.005 % ophthalmic solution Place 1 drop into both eyes at bedtime.   metFORMIN  (GLUCOPHAGE ) 500 MG tablet Take 1 tablet (500 mg total) by mouth 2 (two) times daily with a meal.   pantoprazole  (PROTONIX ) 40 MG tablet Take 1 tablet (40 mg total) by mouth daily.   prednisoLONE 5 MG TABS tablet Take 20 mg by mouth daily.   valsartan  (DIOVAN ) 160 MG tablet Take 1 tablet (160 mg total) by mouth daily.   [Paused] atorvastatin  (LIPITOR) 80 MG tablet TAKE 1 TABLET BY MOUTH DAILY (Patient not taking: Reported on 09/21/2023)   ciprofloxacin  (CIPRO ) 500 MG tablet Take 1 tablet (500 mg total) by mouth 2 (two) times daily. (Patient not taking: Reported on 09/21/2023)   [Paused] ezetimibe  (ZETIA ) 10 MG tablet Take 1 tablet (10 mg total) by mouth daily. (Patient not taking: Reported on 09/21/2023)   [Paused] finasteride (PROSCAR) 5 MG tablet Take 5 mg by mouth daily. (Patient not  taking: Reported on 09/21/2023)   No facility-administered encounter medications on file as of 09/21/2023.    Allergies (verified) Hydrochlorothiazide   History: Past Medical History:  Diagnosis Date   Allergy 2000   Breast lump    Cataract    Colon polyps    Fibroids    GERD (gastroesophageal reflux disease)    Glaucoma    Heart murmur    Hyperlipidemia 01/05/1984   Hypertension 01/05/1984   Interstitial cystitis 01/04/1997   Kidney stones 01/05/2003   Neuromuscular disorder (HCC)    Osteoporosis    Preop cardiovascular exam 10/25/2022   Sleep apnea 2021   Tonsillitis    Past Surgical History:  Procedure Laterality Date   ABDOMINAL HYSTERECTOMY  1997    BREAST EXCISIONAL BIOPSY Right 07/27/2006   Intraductal Papilloma   BREAST SURGERY  2008   CATARACT EXTRACTION Bilateral 02/2020   other in 05/2020   COLONOSCOPY  2010   Dr Kristie   ESOPHAGOGASTRODUODENOSCOPY N/A 09/15/2023   Procedure: EGD (ESOPHAGOGASTRODUODENOSCOPY);  Surgeon: Wilhelmenia Aloha Raddle., MD;  Location: [facility];  Service: Gastroenterology;  Laterality: N/A;   EUS N/A 09/15/2023   Procedure: ULTRASOUND, UPPER GI TRACT, ENDOSCOPIC;  Surgeon: Wilhelmenia Aloha Raddle., MD;  Location: [facility];  Service: Gastroenterology;  Laterality: N/A;   EYE SURGERY  2022   FINE NEEDLE ASPIRATION  09/15/2023   Procedure: FINE NEEDLE ASPIRATION;  Surgeon: Wilhelmenia Aloha Raddle., MD;  Location: [facility];  Service: Gastroenterology;;   HYSTEROSCOPY  1993   IR FLUORO GUIDE CV LINE RIGHT  06/24/2023   IR REMOVAL TUN CV CATH W/O FL  07/04/2023   IR US  GUIDE VASC ACCESS RIGHT  06/24/2023   LAPAROSCOPIC HYSTERECTOMY  1994   MYOMECTOMY  1993   excision of tumor   POLYPECTOMY     TONSILLECTOMY  1953   TUBAL LIGATION  1980   Family History  Problem Relation Age of Onset   Hypertension Mother    Heart disease Mother    Colon cancer Mother 75   Arthritis Mother    Cancer Mother    Coronary artery disease Father        mother, PGM   Stroke Father    Hypertension Father    Colon cancer Father 68   Cancer Father    Colon cancer Maternal Grandmother    Diabetes Other    Breast cancer Neg Hx    Colon polyps Neg Hx    Esophageal cancer Neg Hx    Stomach cancer Neg Hx    Rectal cancer Neg Hx    BRCA 1/2 Neg Hx    Social History   Socioeconomic History   Marital status: Married    Spouse name: Not on file   Number of children: 2   Years of education: Not on file   Highest education level: Doctorate  Occupational History   Occupation: retired   Occupation: retired  Tobacco Use   Smoking status: Never   Smokeless tobacco: Never  Vaping Use   Vaping status: Never Used   Substance and Sexual Activity   Alcohol use: Not Currently    Alcohol/week: 5.0 standard drinks of alcohol    Types: 5 Glasses of wine per week   Drug use: No   Sexual activity: Not Currently  Other Topics Concern   Not on file  Social History Narrative   Not on file   Social Drivers of Health   Financial Resource Strain: Low Risk  (09/21/2023)   Overall Financial Resource  Strain (CARDIA)    Difficulty of Paying Living Expenses: Not hard at all  Food Insecurity: No Food Insecurity (09/21/2023)   Hunger Vital Sign    Worried About Running Out of Food in the Last Year: Never true    Ran Out of Food in the Last Year: Never true  Transportation Needs: No Transportation Needs (09/21/2023)   PRAPARE - Administrator, Civil Service (Medical): No    Lack of Transportation (Non-Medical): No  Physical Activity: Inactive (09/21/2023)   Exercise Vital Sign    Days of Exercise per Week: 0 days    Minutes of Exercise per Session: 0 min  Stress: No Stress Concern Present (09/21/2023)   [employer] of Occupational Health - Occupational Stress Questionnaire    Feeling of Stress: Not at all  Social Connections: Moderately Isolated (09/21/2023)   Social Connection and Isolation Panel    Frequency of Communication with Friends and Family: Three times a week    Frequency of Social Gatherings with Friends and Family: Twice a week    Attends Religious Services: Never    Database administrator or Organizations: No    Attends Engineer, structural: Never    Marital Status: Married    Tobacco Counseling Counseling given: Not Answered    Clinical Intake:  Pre-visit preparation completed: Yes  Pain : No/denies pain     Nutritional Status: BMI > 30  Obese Nutritional Risks: None Diabetes: No  Lab Results  Component Value Date   HGBA1C 5.7 (H) 08/29/2023   HGBA1C 6.2 (H) 06/02/2023   HGBA1C 6.2 (H) 02/16/2023     How often do you need to have someone help you  when you read instructions, pamphlets, or other written materials from your doctor or pharmacy?: 1 - Never  Interpreter Needed?: No  Information entered by :: NAllen LPN   Activities of Daily Living     09/19/2023   12:00 PM 07/07/2023    1:31 PM  In your present state of health, do you have any difficulty performing the following activities:  Hearing? 0 0  Vision? 0 0  Difficulty concentrating or making decisions? 0 0  Walking or climbing stairs? 0   Dressing or bathing? 0   Doing errands, shopping? 0   Preparing Food and eating ? N   Using the Toilet? N   In the past six months, have you accidently leaked urine? N   Do you have problems with loss of bowel control? N   Managing your Medications? N   Managing your Finances? N   Housekeeping or managing your Housekeeping? N     Patient Care Team: Jarold Medici, MD as PCP - General (Internal Medicine) Anner Alm ORN, MD as PCP - Cardiology (Cardiology) [facility], [facility]  I have updated your Care Teams any recent Medical Services you may have received from other providers in the past year.     Assessment:   This is a routine wellness examination for Marissa Orozco.  Hearing/Vision screen Hearing Screening - Comments:: Denies hearing issues Vision Screening - Comments:: Regular eye exams, [facility]   Goals Addressed             This Visit's Progress    Patient Stated       09/21/2023, wants to get strength back       Depression Screen     09/21/2023    8:30 AM 07/25/2023    2:25 PM 06/02/2023   10:04 AM  02/16/2023   10:34 AM 09/15/2022    8:28 AM 06/15/2022   11:14 AM 02/09/2022    8:30 AM  PHQ 2/9 Scores  PHQ - 2 Score 0 0 0 0 0 0 0  PHQ- 9 Score 0 0 0 0 0 0     Fall Risk     09/19/2023   12:00 PM 07/25/2023    2:24 PM 06/02/2023   10:04 AM 02/16/2023   10:34 AM 09/13/2022    8:15 PM  Fall Risk   Falls in the past year? 0 0 0 0 1  Comment     slipped getting out of shower Patient entered  data at this time. Check the audit trail.  Number falls in past yr: 0 0 0 0 0  Injury with Fall? 0 0 0 0 1  Comment     broke wrist Patient entered data at this time. Check the audit trail.  Risk for fall due to : Medication side effect No Fall Risks No Fall Risks No Fall Risks Medication side effect  Follow up Falls prevention discussed;Falls evaluation completed Falls evaluation completed Falls evaluation completed Falls evaluation completed Falls prevention discussed;Falls evaluation completed    MEDICARE RISK AT HOME:  Medicare Risk at Home Any stairs in or around the home?: (Patient-Rptd) Yes If so, are there any without handrails?: (Patient-Rptd) No Home free of loose throw rugs in walkways, pet beds, electrical cords, etc?: (Patient-Rptd) Yes Adequate lighting in your home to reduce risk of falls?: (Patient-Rptd) Yes Life alert?: (Patient-Rptd) No Use of a cane, walker or w/c?: (Patient-Rptd) No Grab bars in the bathroom?: (Patient-Rptd) No Shower chair or bench in shower?: (Patient-Rptd) Yes Elevated toilet seat or a handicapped toilet?: (Patient-Rptd) No  TIMED UP AND GO:  Was the test performed?  Yes  Length of time to ambulate 10 feet: 5 sec Gait steady and fast without use of assistive device  Cognitive Function: 6CIT completed        09/21/2023    8:30 AM 09/15/2022    8:28 AM 09/03/2021    9:40 AM 08/06/2020   11:27 AM 07/19/2019    9:28 AM  6CIT Screen  What Year? 0 points 0 points 0 points 0 points 0 points  What month? 0 points 0 points 0 points 0 points 0 points  What time? 0 points 0 points 0 points 0 points 0 points  Count back from 20 0 points 0 points 0 points 0 points 0 points  Months in reverse 0 points 0 points 0 points 0 points 0 points  Repeat phrase 2 points 2 points 2 points 2 points 0 points  Total Score 2 points 2 points 2 points 2 points 0 points    Immunizations Immunization History  Administered Date(s) Administered   Fluad Quad(high Dose  65+) 10/17/2019, 10/22/2020   Fluad Trivalent(High Dose 65+) 09/15/2022   INFLUENZA, HIGH DOSE SEASONAL PF 11/20/2018   Influenza-Unspecified 09/02/2017   Moderna SARS-COV2 Booster Vaccination 11/02/2019, 10/22/2020   Moderna Sars-Covid-2 Vaccination 01/26/2019, 03/02/2019   PNEUMOCOCCAL CONJUGATE-20 01/02/2021   Pneumococcal Polysaccharide-23 01/30/2018   Tdap 06/01/2012, 06/15/2022   Zoster Recombinant(Shingrix ) 09/03/2021, 02/09/2022   Zoster, Live 10/17/2012    Screening Tests Health Maintenance  Topic Date Due   COVID-19 Vaccine (3 - Moderna risk series) 10/07/2023 (Originally 11/19/2020)   Influenza Vaccine  04/03/2024 (Originally 08/05/2023)   Medicare Annual Wellness (AWV)  09/20/2024   Colonoscopy  02/02/2028   DTaP/Tdap/Td (3 - Td or Tdap) 06/14/2032  Pneumococcal Vaccine: 50+ Years  Completed   DEXA SCAN  Completed   Hepatitis C Screening  Completed   Zoster Vaccines- Shingrix   Completed   HPV VACCINES  Aged Out   Meningococcal B Vaccine  Aged Out   Mammogram  Discontinued    Health Maintenance Items Addressed: Up to date  Additional Screening:  Vision Screening: Recommended annual ophthalmology exams for early detection of glaucoma and other disorders of the eye. Is the patient up to date with their annual eye exam?  Yes  Who is the provider or what is the name of the office in which the patient attends annual eye exams? [facility]  Dental Screening: Recommended annual dental exams for proper oral hygiene  Community Resource Referral / Chronic Care Management: CRR required this visit?  No   CCM required this visit?  No   Plan:    I have personally reviewed and noted the following in the patient's chart:   Medical and social history Use of alcohol, tobacco or illicit drugs  Current medications and supplements including opioid prescriptions. Patient is not currently taking opioid prescriptions. Functional ability and status Nutritional  status Physical activity Advanced directives List of other physicians Hospitalizations, surgeries, and ER visits in previous 12 months Vitals Screenings to include cognitive, depression, and falls Referrals and appointments  In addition, I have reviewed and discussed with patient certain preventive protocols, quality metrics, and best practice recommendations. A written personalized care plan for preventive services as well as general preventive health recommendations were provided to patient.   Marissa FORBES Dawn, LPN   [id number]   After Visit Summary: (In Person-Printed) AVS printed and given to the patient  Notes: Nothing significant to report at this time.

## 2023-09-21 NOTE — Patient Instructions (Signed)
 Hypertension, Adult Hypertension is another name for high blood pressure. High blood pressure forces your heart to work harder to pump blood. This can cause problems over time. There are two numbers in a blood pressure reading. There is a top number (systolic) over a bottom number (diastolic). It is best to have a blood pressure that is below 120/80. What are the causes? The cause of this condition is not known. Some other conditions can lead to high blood pressure. What increases the risk? Some lifestyle factors can make you more likely to develop high blood pressure: Smoking. Not getting enough exercise or physical activity. Being overweight. Having too much fat, sugar, calories, or salt (sodium) in your diet. Drinking too much alcohol. Other risk factors include: Having any of these conditions: Heart disease. Diabetes. High cholesterol. Kidney disease. Obstructive sleep apnea. Having a family history of high blood pressure and high cholesterol. Age. The risk increases with age. Stress. What are the signs or symptoms? High blood pressure may not cause symptoms. Very high blood pressure (hypertensive crisis) may cause: Headache. Fast or uneven heartbeats (palpitations). Shortness of breath. Nosebleed. Vomiting or feeling like you may vomit (nauseous). Changes in how you see. Very bad chest pain. Feeling dizzy. Seizures. How is this treated? This condition is treated by making healthy lifestyle changes, such as: Eating healthy foods. Exercising more. Drinking less alcohol. Your doctor may prescribe medicine if lifestyle changes do not help enough and if: Your top number is above 130. Your bottom number is above 80. Your personal target blood pressure may vary. Follow these instructions at home: Eating and drinking  If told, follow the DASH eating plan. To follow this plan: Fill one half of your plate at each meal with fruits and vegetables. Fill one fourth of your plate  at each meal with whole grains. Whole grains include whole-wheat pasta, brown rice, and whole-grain bread. Eat or drink low-fat dairy products, such as skim milk or low-fat yogurt. Fill one fourth of your plate at each meal with low-fat (lean) proteins. Low-fat proteins include fish, chicken without skin, eggs, beans, and tofu. Avoid fatty meat, cured and processed meat, or chicken with skin. Avoid pre-made or processed food. Limit the amount of salt in your diet to less than 1,500 mg each day. Do not drink alcohol if: Your doctor tells you not to drink. You are pregnant, may be pregnant, or are planning to become pregnant. If you drink alcohol: Limit how much you have to: 0-1 drink a day for women. 0-2 drinks a day for men. Know how much alcohol is in your drink. In the [place] one drink equals one 12 oz bottle of beer (355 mL), one 5 oz glass of wine (148 mL), or one 1 oz glass of hard liquor (44 mL). Lifestyle  Work with your doctor to stay at a healthy weight or to lose weight. Ask your doctor what the best weight is for you. Get at least 30 minutes of exercise that causes your heart to beat faster (aerobic exercise) most days of the week. This may include walking, swimming, or biking. Get at least 30 minutes of exercise that strengthens your muscles (resistance exercise) at least 3 days a week. This may include lifting weights or doing Pilates. Do not smoke or use any products that contain nicotine or tobacco. If you need help quitting, ask your doctor. Check your blood pressure at home as told by your doctor. Keep all follow-up visits. Medicines Take over-the-counter and prescription medicines  only as told by your doctor. Follow directions carefully. Do not skip doses of blood pressure medicine. The medicine does not work as well if you skip doses. Skipping doses also puts you at risk for problems. Ask your doctor about side effects or reactions to medicines that you should watch  for. Contact a doctor if: You think you are having a reaction to the medicine you are taking. You have headaches that keep coming back. You feel dizzy. You have swelling in your ankles. You have trouble with your vision. Get help right away if: You get a very bad headache. You start to feel mixed up (confused). You feel weak or numb. You feel faint. You have very bad pain in your: Chest. Belly (abdomen). You vomit more than once. You have trouble breathing. These symptoms may be an emergency. Get help right away. Call 911. Do not wait to see if the symptoms will go away. Do not drive yourself to the hospital. Summary Hypertension is another name for high blood pressure. High blood pressure forces your heart to work harder to pump blood. For most people, a normal blood pressure is less than 120/80. Making healthy choices can help lower blood pressure. If your blood pressure does not get lower with healthy choices, you may need to take medicine. This information is not intended to replace advice given to you by your health care provider. Make sure you discuss any questions you have with your health care provider. Document Revised: 10/09/2020 Document Reviewed: 10/09/2020 Elsevier Patient Education  2024 [employer].

## 2023-09-21 NOTE — Progress Notes (Signed)
 I,Victoria T Emmitt, CMA,acting as a Neurosurgeon for Catheryn LOISE Slocumb, MD.,have documented all relevant documentation on the behalf of Catheryn LOISE Slocumb, MD,as directed by  Catheryn LOISE Slocumb, MD while in the presence of Catheryn LOISE Slocumb, MD.  Subjective:  Patient ID: Marissa Orozco , female    DOB: February 19, 1945 , 78 y.o.   MRN: [id number]  Chief Complaint  Patient presents with   Hypertension    Patient presents today for bp & predm follow up. She reports compliance with medications. Denies headache, chest pain & sob.  AWV completed with [facility] advisor: Jiles     Prediabetes    HPI Discussed the use of AI scribe software for clinical note transcription with the patient, who gave verbal consent to proceed.  History of Present Illness Marissa Orozco is a 78 year old female with hypertension who presents for an annual wellness visit.  Her blood pressure readings at home have been similar to today's reading of 148/80 mmHg, which she describes as 'crazy high'. She is currently taking amlodipine  5 mg at night and valsartan  in the morning.  She mentions a past allergic reaction to hydrochlorothiazide, characterized by itching, but is unsure if she is still allergic. No known sulfa allergies.  She walks twice a day for eight minutes each time, primarily in her kitchen, a routine left over from physical therapy. She feels comfortable walking in her backyard and is open to trying to walk in her driveway.  She has not had any recent appointments with Dr. Hays, with her next appointment scheduled for November to recheck her liver enzymes. Her appetite is 'very good'.  Her home blood pressure monitor was purchased two weeks ago, and she has not changed the batteries since acquiring it. Her blood pressure readings at home have been higher.   Hypertension This is a chronic problem. The current episode started more than 1 year ago. The problem is controlled. Pertinent negatives include no blurred vision,  chest pain, palpitations or shortness of breath. Risk factors for coronary artery disease include dyslipidemia and post-menopausal state. Past treatments include ACE inhibitors. The current treatment provides moderate improvement.     Past Medical History:  Diagnosis Date   Allergy 2000   Breast lump    Cataract    Colon polyps    Fibroids    GERD (gastroesophageal reflux disease)    Glaucoma    Heart murmur    Hyperlipidemia 01/05/1984   Hypertension 01/05/1984   Interstitial cystitis 01/04/1997   Kidney stones 01/05/2003   Neuromuscular disorder (HCC)    Osteoporosis    Preop cardiovascular exam 10/25/2022   Sleep apnea 2021   Tonsillitis      Family History  Problem Relation Age of Onset   Hypertension Mother    Heart disease Mother    Colon cancer Mother 83   Arthritis Mother    Cancer Mother    Coronary artery disease Father        mother, PGM   Stroke Father    Hypertension Father    Colon cancer Father 52   Cancer Father    Colon cancer Maternal Grandmother    Diabetes Other    Breast cancer Neg Hx    Colon polyps Neg Hx    Esophageal cancer Neg Hx    Stomach cancer Neg Hx    Rectal cancer Neg Hx    BRCA 1/2 Neg Hx      Current Outpatient Medications:    carvedilol (COREG)  6.25 MG tablet, Take 6.25 mg by mouth., Disp: , Rfl:    spironolactone  (ALDACTONE ) 25 MG tablet, Take 1/2 tablet po daily, Disp: 45 tablet, Rfl: 0   amLODipine  (NORVASC ) 5 MG tablet, TAKE 1 TABLET BY MOUTH DAILY, Disp: 90 tablet, Rfl: 3   ascorbic acid  (VITAMIN C ) 1000 MG tablet, Take 1 tablet (1,000 mg total) by mouth daily., Disp: 100 tablet, Rfl: 0   aspirin  EC 81 MG tablet, Take 1 tablet (81 mg total) by mouth daily. Swallow whole., Disp: 30 tablet, Rfl: 12   [Paused] atorvastatin  (LIPITOR) 80 MG tablet, TAKE 1 TABLET BY MOUTH DAILY, Disp: 90 tablet, Rfl: 3   Calcium  Carbonate (CALCIUM  500 PO), Take 500 mg by mouth daily with breakfast., Disp: , Rfl:    Cholecalciferol (VITAMIN D3)  50 MCG (2000 UT) TABS, Take 2,000 Units by mouth daily with breakfast., Disp: , Rfl:    ciprofloxacin  (CIPRO ) 500 MG tablet, Take 1 tablet (500 mg total) by mouth 2 (two) times daily. (Patient not taking: Reported on 09/21/2023), Disp: 6 tablet, Rfl: 0   dorzolamide -timolol  (COSOPT ) 22.3-6.8 MG/ML ophthalmic solution, Place 1 drop into both eyes in the morning and at bedtime., Disp: , Rfl:    [Paused] ezetimibe  (ZETIA ) 10 MG tablet, Take 1 tablet (10 mg total) by mouth daily., Disp: 90 tablet, Rfl: 3   [Paused] finasteride (PROSCAR) 5 MG tablet, Take 5 mg by mouth daily., Disp: , Rfl:    latanoprost  (XALATAN ) 0.005 % ophthalmic solution, Place 1 drop into both eyes at bedtime., Disp: , Rfl:    metFORMIN  (GLUCOPHAGE ) 500 MG tablet, Take 1 tablet (500 mg total) by mouth 2 (two) times daily with a meal., Disp: 180 tablet, Rfl: 0   pantoprazole  (PROTONIX ) 40 MG tablet, Take 1 tablet (40 mg total) by mouth daily., Disp: 90 tablet, Rfl: 3   prednisoLONE 5 MG TABS tablet, Take 20 mg by mouth daily., Disp: , Rfl:    valsartan  (DIOVAN ) 160 MG tablet, Take 1 tablet (160 mg total) by mouth daily., Disp: 90 tablet, Rfl: 3   Allergies  Allergen Reactions   Hydrochlorothiazide Itching     Review of Systems  Constitutional: Negative.   Eyes:  Negative for blurred vision.  Respiratory: Negative.  Negative for shortness of breath.   Cardiovascular: Negative.  Negative for chest pain and palpitations.  Neurological: Negative.   Psychiatric/Behavioral: Negative.       Today's Vitals   09/21/23 0841 09/21/23 0849  BP: (!) 150/80 (!) 142/80  Pulse: 89   Temp: 98.4 F (36.9 C)   SpO2: 98%   Weight: 170 lb (77.1 kg)   Height: 5' 3 (1.6 m)    Body mass index is 30.11 kg/m.  Wt Readings from Last 3 Encounters:  09/27/23 170 lb (77.1 kg)  09/21/23 170 lb (77.1 kg)  09/21/23 170 lb (77.1 kg)     Objective:  Physical Exam Vitals and nursing note reviewed.  Constitutional:      Appearance: Normal  appearance.  HENT:     Head: Normocephalic and atraumatic.  Eyes:     Extraocular Movements: Extraocular movements intact.  Cardiovascular:     Rate and Rhythm: Normal rate and regular rhythm.     Heart sounds: Normal heart sounds.  Pulmonary:     Effort: Pulmonary effort is normal.     Breath sounds: Normal breath sounds.  Skin:    General: Skin is warm.  Neurological:     General: No focal deficit present.  Mental Status: She is alert.  Psychiatric:        Mood and Affect: Mood normal.        Behavior: Behavior normal.         Assessment And Plan:  Hypertensive heart and renal disease with renal failure, stage 1 through stage 4 or unspecified chronic kidney disease, without heart failure Assessment & Plan: Chronic.  Blood pressure elevated at 148/80 mmHg. Current regimen includes amlodipine  and valsartan . Considering diuretic addition due to improved kidney function. Hydrochlorothiazide contraindicated due to allergy. Spironolactone  considered. Concerns about ankle swelling with increased amlodipine . - Initiate spironolactone  at half a tablet daily. - Recheck blood pressure and kidney function in one week. - Monitor potassium levels due to spironolactone . - Consider increasing amlodipine  if blood pressure goals are not met. - Advise wearing compression hose if standing for extended periods. - Encourage walking in the driveway or backyard for exercise.  Orders: -     Microalbumin / creatinine urine ratio; Future -     BMP8+EGFR; Future  Atherosclerosis of both carotid arteries Assessment & Plan: Chronic, target LDL should be less than 70 based on carotid and coronary plaque. Atorvastatin  currently on hold.  Encouraged to follow heart healthy lifestyle.    Chronic kidney disease, stage II (mild) Assessment & Plan: Chronic, she is encouraged to keep BP well controlled and to stay well hydrated to decrease risk of CKD progression.   - Perform urine test next week to  assess proteinuria. - Recheck kidney function in one week.   Class 1 obesity due to excess calories with serious comorbidity and body mass index (BMI) of 30.0 to 30.9 in adult Assessment & Plan: She is encouraged to gradually increase her daily activity, aiming for at least 150 minutes of exercise per week.    Other orders -     Spironolactone ; Take 1/2 tablet po daily  Dispense: 45 tablet; Refill: 0       Return in 6 days (on 09/27/2023), or [place] bp check/lab visit, for 1 year AWV w thn.  Patient was given opportunity to ask questions. Patient verbalized understanding of the plan and was able to repeat key elements of the plan. All questions were answered to their satisfaction.   I, Catheryn LOISE Slocumb, MD, have reviewed all documentation for this visit. The documentation on 10/01/23 for the exam, diagnosis, procedures, and orders are all accurate and complete.   IF YOU HAVE BEEN REFERRED TO A SPECIALIST, IT MAY TAKE 1-2 WEEKS TO SCHEDULE/PROCESS THE REFERRAL. IF YOU HAVE NOT HEARD FROM US /SPECIALIST IN TWO WEEKS, PLEASE GIVE US  A CALL AT [phone] X [phone].   THE PATIENT IS ENCOURAGED TO PRACTICE SOCIAL DISTANCING DUE TO THE COVID-19 PANDEMIC.

## 2023-09-22 ENCOUNTER — Encounter: Payer: Self-pay | Admitting: Gastroenterology

## 2023-09-22 NOTE — Progress Notes (Unsigned)
 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  This is a premalignant cyst that has the potential to develop into cancer in the future (it may never) but it can.  Your cyst is also increased in size over time as well.  We are going to place a referral to one of our pancreas surgeons to have an opportunity to discuss surveillance and monitoring.  This is solely to be placed to have a discussion, not necessary to say you must have surgery.  Based on your portal hypertension (varices) and your autoimmune hepatitis, you would be at higher risk for surgical interventions, so before anything is ever considered we need to be sure that you are as optimized as possible, if we were to ever think about the need for surgery.  I am going to have my team send this information to your [facility] team as well so they are aware.  Tentatively plan for repeat imaging study in 3 months with MRI/MRCP.  Aloha Finner, MD [facility] Office # [phone]  ***

## 2023-09-23 ENCOUNTER — Other Ambulatory Visit: Payer: Self-pay

## 2023-09-23 NOTE — Progress Notes (Signed)
 Report sent to [facility] Referral and records sent to [facility] Debora faxed to [phone] Nurse navigator is [place] [phone] phone  Pt to be contacted in 3 months for MRI and labs

## 2023-09-27 ENCOUNTER — Ambulatory Visit

## 2023-09-27 ENCOUNTER — Other Ambulatory Visit: Payer: Self-pay

## 2023-09-27 VITALS — BP 130/70 | HR 71 | Temp 98.1°F | Ht 63.0 in | Wt 170.0 lb

## 2023-09-27 DIAGNOSIS — Z79899 Other long term (current) drug therapy: Secondary | ICD-10-CM

## 2023-09-27 DIAGNOSIS — I119 Hypertensive heart disease without heart failure: Secondary | ICD-10-CM

## 2023-09-27 NOTE — Progress Notes (Signed)
 Patient presents today for bpc & lab visit. Denies headache, chest pain & sob. She currently takes Amlodipine  5MG  at night, Carvedilol she has no received yet which will come at the end of the month. Spironolactone  25MG  she takes in the morning & valsartan  160MG  she takes in the morning. She states taking morning bp meds before coming in today. Initial bp: 150/80. Bp taken again after 10 minutes:  BP Readings from Last 3 Encounters:  09/27/23 130/70  09/21/23 (!) 142/80  09/21/23 (!) 150/80  Per provider patient is to follow up with [facility] in 4 weeks. Appointment scheduled. Patient aware.

## 2023-09-27 NOTE — Patient Instructions (Signed)
 Hypertension, Adult Hypertension is another name for high blood pressure. High blood pressure forces your heart to work harder to pump blood. This can cause problems over time. There are two numbers in a blood pressure reading. There is a top number (systolic) over a bottom number (diastolic). It is best to have a blood pressure that is below 120/80. What are the causes? The cause of this condition is not known. Some other conditions can lead to high blood pressure. What increases the risk? Some lifestyle factors can make you more likely to develop high blood pressure: Smoking. Not getting enough exercise or physical activity. Being overweight. Having too much fat, sugar, calories, or salt (sodium) in your diet. Drinking too much alcohol. Other risk factors include: Having any of these conditions: Heart disease. Diabetes. High cholesterol. Kidney disease. Obstructive sleep apnea. Having a family history of high blood pressure and high cholesterol. Age. The risk increases with age. Stress. What are the signs or symptoms? High blood pressure may not cause symptoms. Very high blood pressure (hypertensive crisis) may cause: Headache. Fast or uneven heartbeats (palpitations). Shortness of breath. Nosebleed. Vomiting or feeling like you may vomit (nauseous). Changes in how you see. Very bad chest pain. Feeling dizzy. Seizures. How is this treated? This condition is treated by making healthy lifestyle changes, such as: Eating healthy foods. Exercising more. Drinking less alcohol. Your doctor may prescribe medicine if lifestyle changes do not help enough and if: Your top number is above 130. Your bottom number is above 80. Your personal target blood pressure may vary. Follow these instructions at home: Eating and drinking  If told, follow the DASH eating plan. To follow this plan: Fill one half of your plate at each meal with fruits and vegetables. Fill one fourth of your plate  at each meal with whole grains. Whole grains include whole-wheat pasta, brown rice, and whole-grain bread. Eat or drink low-fat dairy products, such as skim milk or low-fat yogurt. Fill one fourth of your plate at each meal with low-fat (lean) proteins. Low-fat proteins include fish, chicken without skin, eggs, beans, and tofu. Avoid fatty meat, cured and processed meat, or chicken with skin. Avoid pre-made or processed food. Limit the amount of salt in your diet to less than 1,500 mg each day. Do not drink alcohol if: Your doctor tells you not to drink. You are pregnant, may be pregnant, or are planning to become pregnant. If you drink alcohol: Limit how much you have to: 0-1 drink a day for women. 0-2 drinks a day for men. Know how much alcohol is in your drink. In the [place] one drink equals one 12 oz bottle of beer (355 mL), one 5 oz glass of wine (148 mL), or one 1 oz glass of hard liquor (44 mL). Lifestyle  Work with your doctor to stay at a healthy weight or to lose weight. Ask your doctor what the best weight is for you. Get at least 30 minutes of exercise that causes your heart to beat faster (aerobic exercise) most days of the week. This may include walking, swimming, or biking. Get at least 30 minutes of exercise that strengthens your muscles (resistance exercise) at least 3 days a week. This may include lifting weights or doing Pilates. Do not smoke or use any products that contain nicotine or tobacco. If you need help quitting, ask your doctor. Check your blood pressure at home as told by your doctor. Keep all follow-up visits. Medicines Take over-the-counter and prescription medicines  only as told by your doctor. Follow directions carefully. Do not skip doses of blood pressure medicine. The medicine does not work as well if you skip doses. Skipping doses also puts you at risk for problems. Ask your doctor about side effects or reactions to medicines that you should watch  for. Contact a doctor if: You think you are having a reaction to the medicine you are taking. You have headaches that keep coming back. You feel dizzy. You have swelling in your ankles. You have trouble with your vision. Get help right away if: You get a very bad headache. You start to feel mixed up (confused). You feel weak or numb. You feel faint. You have very bad pain in your: Chest. Belly (abdomen). You vomit more than once. You have trouble breathing. These symptoms may be an emergency. Get help right away. Call 911. Do not wait to see if the symptoms will go away. Do not drive yourself to the hospital. Summary Hypertension is another name for high blood pressure. High blood pressure forces your heart to work harder to pump blood. For most people, a normal blood pressure is less than 120/80. Making healthy choices can help lower blood pressure. If your blood pressure does not get lower with healthy choices, you may need to take medicine. This information is not intended to replace advice given to you by your health care provider. Make sure you discuss any questions you have with your health care provider. Document Revised: 10/09/2020 Document Reviewed: 10/09/2020 Elsevier Patient Education  2024 [employer].

## 2023-09-28 ENCOUNTER — Encounter: Payer: Self-pay | Admitting: Internal Medicine

## 2023-09-28 LAB — BMP8+EGFR
BUN/Creatinine Ratio: 21 (ref 12–28)
BUN: 18 mg/dL (ref 8–27)
CO2: 21 mmol/L (ref 20–29)
Calcium: 9.5 mg/dL (ref 8.7–10.3)
Chloride: 105 mmol/L (ref 96–106)
Creatinine, Ser: 0.85 mg/dL (ref 0.57–1.00)
Glucose: 95 mg/dL (ref 70–99)
Potassium: 3.4 mmol/L — ABNORMAL LOW (ref 3.5–5.2)
Sodium: 144 mmol/L (ref 134–144)
eGFR: 70 mL/min/1.73 (ref >59)

## 2023-09-28 LAB — MICROALBUMIN / CREATININE URINE RATIO
Creatinine, Urine: 143.2 mg/dL
Microalb/Creat Ratio: 5 mg/g{creat} (ref 0–29)
Microalbumin, Urine: 6.5 ug/mL

## 2023-09-30 ENCOUNTER — Ambulatory Visit

## 2023-09-30 ENCOUNTER — Other Ambulatory Visit: Payer: Self-pay

## 2023-10-01 ENCOUNTER — Ambulatory Visit: Payer: Self-pay | Admitting: Internal Medicine

## 2023-10-01 DIAGNOSIS — N182 Chronic kidney disease, stage 2 (mild): Secondary | ICD-10-CM | POA: Insufficient documentation

## 2023-10-01 NOTE — Assessment & Plan Note (Signed)
 Chronic.  Blood pressure elevated at 148/80 mmHg. Current regimen includes amlodipine  and valsartan . Considering diuretic addition due to improved kidney function. Hydrochlorothiazide contraindicated due to allergy. Spironolactone  considered. Concerns about ankle swelling with increased amlodipine . - Initiate spironolactone  at half a tablet daily. - Recheck blood pressure and kidney function in one week. - Monitor potassium levels due to spironolactone . - Consider increasing amlodipine  if blood pressure goals are not met. - Advise wearing compression hose if standing for extended periods. - Encourage walking in the driveway or backyard for exercise.

## 2023-10-01 NOTE — Assessment & Plan Note (Addendum)
 Chronic, she is encouraged to keep BP well controlled and to stay well hydrated to decrease risk of CKD progression.   - Perform urine test next week to assess proteinuria. - Recheck kidney function in one week.

## 2023-10-01 NOTE — Assessment & Plan Note (Addendum)
 She is encouraged to gradually increase her daily activity, aiming for at least 150 minutes of exercise per week.

## 2023-10-01 NOTE — Assessment & Plan Note (Addendum)
 Chronic, target LDL should be less than 70 based on carotid and coronary plaque. Atorvastatin  currently on hold.  Encouraged to follow heart healthy lifestyle.

## 2023-10-11 DIAGNOSIS — R978 Other abnormal tumor markers: Secondary | ICD-10-CM | POA: Diagnosis not present

## 2023-10-11 DIAGNOSIS — K862 Cyst of pancreas: Secondary | ICD-10-CM | POA: Diagnosis not present

## 2023-10-11 DIAGNOSIS — R7989 Other specified abnormal findings of blood chemistry: Secondary | ICD-10-CM | POA: Diagnosis not present

## 2023-10-11 DIAGNOSIS — D490 Neoplasm of unspecified behavior of digestive system: Secondary | ICD-10-CM | POA: Diagnosis not present

## 2023-10-11 DIAGNOSIS — R799 Abnormal finding of blood chemistry, unspecified: Secondary | ICD-10-CM | POA: Diagnosis not present

## 2023-10-12 DIAGNOSIS — K754 Autoimmune hepatitis: Secondary | ICD-10-CM | POA: Diagnosis not present

## 2023-10-17 ENCOUNTER — Encounter: Payer: Self-pay | Admitting: Internal Medicine

## 2023-10-25 ENCOUNTER — Encounter: Payer: Self-pay | Admitting: Internal Medicine

## 2023-10-25 ENCOUNTER — Ambulatory Visit (INDEPENDENT_AMBULATORY_CARE_PROVIDER_SITE_OTHER): Admitting: Internal Medicine

## 2023-10-25 VITALS — BP 122/80 | HR 69 | Temp 98.4°F | Ht 63.0 in | Wt 174.6 lb

## 2023-10-25 DIAGNOSIS — E876 Hypokalemia: Secondary | ICD-10-CM

## 2023-10-25 DIAGNOSIS — N182 Chronic kidney disease, stage 2 (mild): Secondary | ICD-10-CM

## 2023-10-25 DIAGNOSIS — I6523 Occlusion and stenosis of bilateral carotid arteries: Secondary | ICD-10-CM

## 2023-10-25 DIAGNOSIS — Z23 Encounter for immunization: Secondary | ICD-10-CM | POA: Diagnosis not present

## 2023-10-25 DIAGNOSIS — I1310 Hypertensive heart and chronic kidney disease without heart failure, with stage 1 through stage 4 chronic kidney disease, or unspecified chronic kidney disease: Secondary | ICD-10-CM

## 2023-10-25 DIAGNOSIS — D490 Neoplasm of unspecified behavior of digestive system: Secondary | ICD-10-CM | POA: Diagnosis not present

## 2023-10-25 DIAGNOSIS — I119 Hypertensive heart disease without heart failure: Secondary | ICD-10-CM

## 2023-10-25 DIAGNOSIS — R7303 Prediabetes: Secondary | ICD-10-CM

## 2023-10-25 NOTE — Patient Instructions (Signed)
 Hypertension, Adult Hypertension is another name for high blood pressure. High blood pressure forces your heart to work harder to pump blood. This can cause problems over time. There are two numbers in a blood pressure reading. There is a top number (systolic) over a bottom number (diastolic). It is best to have a blood pressure that is below 120/80. What are the causes? The cause of this condition is not known. Some other conditions can lead to high blood pressure. What increases the risk? Some lifestyle factors can make you more likely to develop high blood pressure: Smoking. Not getting enough exercise or physical activity. Being overweight. Having too much fat, sugar, calories, or salt (sodium) in your diet. Drinking too much alcohol. Other risk factors include: Having any of these conditions: Heart disease. Diabetes. High cholesterol. Kidney disease. Obstructive sleep apnea. Having a family history of high blood pressure and high cholesterol. Age. The risk increases with age. Stress. What are the signs or symptoms? High blood pressure may not cause symptoms. Very high blood pressure (hypertensive crisis) may cause: Headache. Fast or uneven heartbeats (palpitations). Shortness of breath. Nosebleed. Vomiting or feeling like you may vomit (nauseous). Changes in how you see. Very bad chest pain. Feeling dizzy. Seizures. How is this treated? This condition is treated by making healthy lifestyle changes, such as: Eating healthy foods. Exercising more. Drinking less alcohol. Your doctor may prescribe medicine if lifestyle changes do not help enough and if: Your top number is above 130. Your bottom number is above 80. Your personal target blood pressure may vary. Follow these instructions at home: Eating and drinking  If told, follow the DASH eating plan. To follow this plan: Fill one half of your plate at each meal with fruits and vegetables. Fill one fourth of your plate  at each meal with whole grains. Whole grains include whole-wheat pasta, brown rice, and whole-grain bread. Eat or drink low-fat dairy products, such as skim milk or low-fat yogurt. Fill one fourth of your plate at each meal with low-fat (lean) proteins. Low-fat proteins include fish, chicken without skin, eggs, beans, and tofu. Avoid fatty meat, cured and processed meat, or chicken with skin. Avoid pre-made or processed food. Limit the amount of salt in your diet to less than 1,500 mg each day. Do not drink alcohol if: Your doctor tells you not to drink. You are pregnant, may be pregnant, or are planning to become pregnant. If you drink alcohol: Limit how much you have to: 0-1 drink a day for women. 0-2 drinks a day for men. Know how much alcohol is in your drink. In the [place] one drink equals one 12 oz bottle of beer (355 mL), one 5 oz glass of wine (148 mL), or one 1 oz glass of hard liquor (44 mL). Lifestyle  Work with your doctor to stay at a healthy weight or to lose weight. Ask your doctor what the best weight is for you. Get at least 30 minutes of exercise that causes your heart to beat faster (aerobic exercise) most days of the week. This may include walking, swimming, or biking. Get at least 30 minutes of exercise that strengthens your muscles (resistance exercise) at least 3 days a week. This may include lifting weights or doing Pilates. Do not smoke or use any products that contain nicotine or tobacco. If you need help quitting, ask your doctor. Check your blood pressure at home as told by your doctor. Keep all follow-up visits. Medicines Take over-the-counter and prescription medicines  only as told by your doctor. Follow directions carefully. Do not skip doses of blood pressure medicine. The medicine does not work as well if you skip doses. Skipping doses also puts you at risk for problems. Ask your doctor about side effects or reactions to medicines that you should watch  for. Contact a doctor if: You think you are having a reaction to the medicine you are taking. You have headaches that keep coming back. You feel dizzy. You have swelling in your ankles. You have trouble with your vision. Get help right away if: You get a very bad headache. You start to feel mixed up (confused). You feel weak or numb. You feel faint. You have very bad pain in your: Chest. Belly (abdomen). You vomit more than once. You have trouble breathing. These symptoms may be an emergency. Get help right away. Call 911. Do not wait to see if the symptoms will go away. Do not drive yourself to the hospital. Summary Hypertension is another name for high blood pressure. High blood pressure forces your heart to work harder to pump blood. For most people, a normal blood pressure is less than 120/80. Making healthy choices can help lower blood pressure. If your blood pressure does not get lower with healthy choices, you may need to take medicine. This information is not intended to replace advice given to you by your health care provider. Make sure you discuss any questions you have with your health care provider. Document Revised: 10/09/2020 Document Reviewed: 10/09/2020 Elsevier Patient Education  2024 [employer].

## 2023-10-25 NOTE — Progress Notes (Signed)
 I,Marissa Orozco, CMA,acting as a neurosurgeon for Marissa LOISE Slocumb, MD.,have documented all relevant documentation on the behalf of Marissa LOISE Slocumb, MD,as directed by  Marissa LOISE Slocumb, MD while in the presence of Marissa LOISE Slocumb, MD.  Subjective:  Patient ID: Marissa Orozco , female    DOB: 06-19-1945 , 78 y.o.   MRN: [id number]  Chief Complaint  Patient presents with   Hypertension    Patient presents today for bp & predm follow up. She reports compliance with medications. Denies headache, chest pain & sob. She still notices swollen ankles.    Prediabetes    HPI Discussed the use of AI scribe software for clinical note transcription with the patient, who gave verbal consent to proceed.  History of Present Illness Marissa Orozco is a 78 year old female with hypertension and IPMN who presents for blood pressure management and follow-up on pancreatic concerns.  Blood pressure readings have been variable, with fluctuations between morning and afternoon. Recently, her blood pressure has been lower in the morning and higher in the afternoon. She is currently taking amlodipine  5 mg, carvedilol, and spironolactone . She has discontinued prednisolone. No dizziness and she feels stable on her current regimen. Her potassium was slightly low on September 23rd, and her chloride levels have come down since starting spironolactone .  She is concerned about her pancreatic condition, specifically the intraductal papillary mucinous neoplasm (IPMN). She is awaiting an MRI next week to determine the need for a potential RIPA procedure. Recent lab work, including kidney function tests, were normal. She is apprehensive about the potential surgery, describing it as 'traumatic' and involving the removal and replacement of organs. She anticipates a week-long hospital stay and a three-week recovery period if surgery is required.  Regarding her physical activity, she walks regularly at home for about 15 minutes and  feels okay afterward. She plans to gradually increase her walking duration. She is working on improving her nutrition, typically having oatmeal for breakfast and occasionally skipping lunch, but she is considering adding high-protein snacks. Her daughter assists with meals, such as preparing butter chicken and rice for dinner.  Her current medications include amlodipine  5 mg, aspirin , carvedilol, metformin  taken twice daily, pantoprazole , and spironolactone . She has stopped prednisolone. She is planning to travel to [place]  at the end of next week.   Hypertension This is a chronic problem. The current episode started more than 1 year ago. The problem is controlled. Pertinent negatives include no blurred vision, chest pain, palpitations or shortness of breath. Risk factors for coronary artery disease include dyslipidemia and post-menopausal state. Past treatments include ACE inhibitors. The current treatment provides moderate improvement.     Past Medical History:  Diagnosis Date   Allergy 2000   Breast lump    Cataract    Colon polyps    Fibroids    GERD (gastroesophageal reflux disease)    Glaucoma    Heart murmur    Hyperlipidemia 01/05/1984   Hypertension 01/05/1984   Interstitial cystitis 01/04/1997   Kidney stones 01/05/2003   Neuromuscular disorder (HCC)    Osteoporosis    Preop cardiovascular exam 10/25/2022   Sleep apnea 2021   Tonsillitis      Family History  Problem Relation Age of Onset   Hypertension Mother    Heart disease Mother    Colon cancer Mother 17   Arthritis Mother    Cancer Mother    Coronary artery disease Father        mother, PGM  Stroke Father    Hypertension Father    Colon cancer Father 18   Cancer Father    Colon cancer Maternal Grandmother    Diabetes Other    Breast cancer Neg Hx    Colon polyps Neg Hx    Esophageal cancer Neg Hx    Stomach cancer Neg Hx    Rectal cancer Neg Hx    BRCA 1/2 Neg Hx      Current Outpatient  Medications:    amLODipine  (NORVASC ) 5 MG tablet, TAKE 1 TABLET BY MOUTH DAILY, Disp: 90 tablet, Rfl: 3   ascorbic acid  (VITAMIN C ) 1000 MG tablet, Take 1 tablet (1,000 mg total) by mouth daily., Disp: 100 tablet, Rfl: 0   aspirin  EC 81 MG tablet, Take 1 tablet (81 mg total) by mouth daily. Swallow whole., Disp: 30 tablet, Rfl: 12   Calcium  Carbonate (CALCIUM  500 PO), Take 500 mg by mouth daily with breakfast., Disp: , Rfl:    carvedilol (COREG) 6.25 MG tablet, Take 6.25 mg by mouth., Disp: , Rfl:    Cholecalciferol (VITAMIN D3) 50 MCG (2000 UT) TABS, Take 2,000 Units by mouth daily with breakfast., Disp: , Rfl:    dorzolamide -timolol  (COSOPT ) 22.3-6.8 MG/ML ophthalmic solution, Place 1 drop into both eyes in the morning and at bedtime., Disp: , Rfl:    latanoprost  (XALATAN ) 0.005 % ophthalmic solution, Place 1 drop into both eyes at bedtime., Disp: , Rfl:    metFORMIN  (GLUCOPHAGE ) 500 MG tablet, Take 1 tablet (500 mg total) by mouth 2 (two) times daily with a meal., Disp: 180 tablet, Rfl: 0   pantoprazole  (PROTONIX ) 40 MG tablet, Take 1 tablet (40 mg total) by mouth daily., Disp: 90 tablet, Rfl: 3   prednisoLONE 5 MG TABS tablet, Take 20 mg by mouth daily., Disp: , Rfl:    spironolactone  (ALDACTONE ) 25 MG tablet, Take 1/2 tablet po daily, Disp: 45 tablet, Rfl: 0   valsartan  (DIOVAN ) 160 MG tablet, Take 1 tablet (160 mg total) by mouth daily., Disp: 90 tablet, Rfl: 3   [Paused] atorvastatin  (LIPITOR) 80 MG tablet, TAKE 1 TABLET BY MOUTH DAILY (Patient not taking: Reported on 10/25/2023), Disp: 90 tablet, Rfl: 3   [Paused] ezetimibe  (ZETIA ) 10 MG tablet, Take 1 tablet (10 mg total) by mouth daily. (Patient not taking: Reported on 10/25/2023), Disp: 90 tablet, Rfl: 3   [Paused] finasteride (PROSCAR) 5 MG tablet, Take 5 mg by mouth daily. (Patient not taking: Reported on 10/25/2023), Disp: , Rfl:    Allergies  Allergen Reactions   Hydrochlorothiazide Itching     Review of Systems   Constitutional: Negative.   Eyes:  Negative for blurred vision.  Respiratory: Negative.  Negative for shortness of breath.   Cardiovascular: Negative.  Negative for chest pain and palpitations.  Gastrointestinal: Negative.   Neurological: Negative.   Psychiatric/Behavioral: Negative.       Today's Vitals   10/25/23 1211  BP: 122/80  Pulse: 69  Temp: 98.4 F (36.9 C)  SpO2: 98%  Weight: 174 lb 9.6 oz (79.2 kg)  Height: 5' 3 (1.6 m)   Body mass index is 30.93 kg/m.  Wt Readings from Last 3 Encounters:  10/25/23 174 lb 9.6 oz (79.2 kg)  09/27/23 170 lb (77.1 kg)  09/21/23 170 lb (77.1 kg)     Objective:  Physical Exam Vitals and nursing note reviewed.  Constitutional:      Appearance: Normal appearance.  HENT:     Head: Normocephalic and atraumatic.  Eyes:  Extraocular Movements: Extraocular movements intact.  Cardiovascular:     Rate and Rhythm: Normal rate and regular rhythm.     Heart sounds: Normal heart sounds.  Pulmonary:     Effort: Pulmonary effort is normal.     Breath sounds: Normal breath sounds.  Musculoskeletal:     Cervical back: Normal range of motion.  Skin:    General: Skin is warm.  Neurological:     General: No focal deficit present.     Mental Status: She is alert.  Psychiatric:        Mood and Affect: Mood normal.        Behavior: Behavior normal.         Assessment And Plan:  Hypertensive heart and renal disease with renal failure, stage 1 through stage 4 or unspecified chronic kidney disease, without heart failure Assessment & Plan: Blood pressure shows variability; recent prednisolone cessation may improve control. Potassium slightly low; spironolactone  should help. Current regimen: amlodipine , carvedilol, spironolactone . Concerns about medication adjustments. - Consider increasing spironolactone  if blood pressure remains high. - Monitor blood pressure, especially PM readings. - Send blood pressure readings by Friday. - Follow  up for NV in 2 weeks. - Reassess management after reviewing readings. - If blood pressure consistently <130 and dizziness occurs, consider decreasing amlodipine .   Atherosclerosis of both carotid arteries Assessment & Plan: Chronic, target LDL should be less than 70 based on carotid and coronary plaque. Atorvastatin  currently on hold.  Encouraged to follow heart healthy lifestyle.    Chronic kidney disease, stage II (mild) Assessment & Plan: Chronic, she is encouraged to keep BP well controlled and to stay well hydrated to decrease risk of CKD progression.      IPMN (intraductal papillary mucinous neoplasm) Assessment & Plan: IPMN confirmed. MRI scheduled to assess risk and determine need for resection. Concerns about surgery complexity and recovery. Anxiety about surgery's impact on health. - Proceed with MRI to assess IPMN. - Discuss potential RIPA based on MRI results. - Prepare for possible surgery by optimizing nutrition and increasing physical activity.   Immunization due -     Flu vaccine HIGH DOSE PF(Fluzone Trivalent)   General Health Maintenance Encouraged increased physical activity and optimized protein intake for recovery and health. - Increase walking duration by 5 minutes every two weeks. - Incorporate high-protein snacks and meals: collagen powder, tuna, chickpeas, bone broth.  Return if symptoms worsen or fail to improve.  Patient was given opportunity to ask questions. Patient verbalized understanding of the plan and was able to repeat key elements of the plan. All questions were answered to their satisfaction.    I, Marissa LOISE Slocumb, MD, have reviewed all documentation for this visit. The documentation on 10/25/23 for the exam, diagnosis, procedures, and orders are all accurate and complete.   IF YOU HAVE BEEN REFERRED TO A SPECIALIST, IT MAY TAKE 1-2 WEEKS TO SCHEDULE/PROCESS THE REFERRAL. IF YOU HAVE NOT HEARD FROM US /SPECIALIST IN TWO WEEKS, PLEASE GIVE US   A CALL AT [phone].   THE PATIENT IS ENCOURAGED TO PRACTICE SOCIAL DISTANCING DUE TO THE COVID-19 PANDEMIC.

## 2023-10-28 ENCOUNTER — Encounter: Payer: Self-pay | Admitting: Internal Medicine

## 2023-11-01 ENCOUNTER — Ambulatory Visit: Admitting: Gastroenterology

## 2023-11-01 DIAGNOSIS — K8689 Other specified diseases of pancreas: Secondary | ICD-10-CM | POA: Diagnosis not present

## 2023-11-01 DIAGNOSIS — K862 Cyst of pancreas: Secondary | ICD-10-CM | POA: Diagnosis not present

## 2023-11-02 DIAGNOSIS — K754 Autoimmune hepatitis: Secondary | ICD-10-CM | POA: Diagnosis not present

## 2023-11-04 DIAGNOSIS — M6281 Muscle weakness (generalized): Secondary | ICD-10-CM | POA: Diagnosis not present

## 2023-11-04 DIAGNOSIS — M25659 Stiffness of unspecified hip, not elsewhere classified: Secondary | ICD-10-CM | POA: Diagnosis not present

## 2023-11-04 DIAGNOSIS — R269 Unspecified abnormalities of gait and mobility: Secondary | ICD-10-CM | POA: Diagnosis not present

## 2023-11-04 DIAGNOSIS — R5381 Other malaise: Secondary | ICD-10-CM | POA: Diagnosis not present

## 2023-11-06 ENCOUNTER — Encounter: Payer: Self-pay | Admitting: Internal Medicine

## 2023-11-06 DIAGNOSIS — D490 Neoplasm of unspecified behavior of digestive system: Secondary | ICD-10-CM | POA: Insufficient documentation

## 2023-11-06 NOTE — Assessment & Plan Note (Signed)
 Chronic, target LDL should be less than 70 based on carotid and coronary plaque. Atorvastatin  currently on hold.  Encouraged to follow heart healthy lifestyle.

## 2023-11-06 NOTE — Assessment & Plan Note (Addendum)
 Blood pressure shows variability; recent prednisolone cessation may improve control. Potassium slightly low; spironolactone  should help. Current regimen: amlodipine , carvedilol, spironolactone . Concerns about medication adjustments. - Consider increasing spironolactone  if blood pressure remains high. - Monitor blood pressure, especially PM readings. - Send blood pressure readings by Friday. - Follow up for NV in 2 weeks. - Reassess management after reviewing readings. - If blood pressure consistently <130 and dizziness occurs, consider decreasing amlodipine .

## 2023-11-06 NOTE — Assessment & Plan Note (Signed)
 IPMN confirmed. MRI scheduled to assess risk and determine need for resection. Concerns about surgery complexity and recovery. Anxiety about surgery's impact on health. - Proceed with MRI to assess IPMN. - Discuss potential RIPA based on MRI results. - Prepare for possible surgery by optimizing nutrition and increasing physical activity.

## 2023-11-06 NOTE — Assessment & Plan Note (Signed)
 Chronic, she is encouraged to keep BP well controlled and to stay well hydrated to decrease risk of CKD progression.

## 2023-11-08 ENCOUNTER — Other Ambulatory Visit: Payer: Self-pay | Admitting: Internal Medicine

## 2023-11-08 DIAGNOSIS — D490 Neoplasm of unspecified behavior of digestive system: Secondary | ICD-10-CM | POA: Diagnosis not present

## 2023-11-08 DIAGNOSIS — K862 Cyst of pancreas: Secondary | ICD-10-CM | POA: Diagnosis not present

## 2023-11-08 DIAGNOSIS — R600 Localized edema: Secondary | ICD-10-CM | POA: Diagnosis not present

## 2023-11-08 DIAGNOSIS — R29898 Other symptoms and signs involving the musculoskeletal system: Secondary | ICD-10-CM

## 2023-11-08 DIAGNOSIS — R978 Other abnormal tumor markers: Secondary | ICD-10-CM | POA: Diagnosis not present

## 2023-11-10 DIAGNOSIS — R600 Localized edema: Secondary | ICD-10-CM | POA: Diagnosis not present

## 2023-11-14 ENCOUNTER — Encounter: Payer: Self-pay | Admitting: Internal Medicine

## 2023-11-16 DIAGNOSIS — R262 Difficulty in walking, not elsewhere classified: Secondary | ICD-10-CM | POA: Diagnosis not present

## 2023-11-16 DIAGNOSIS — M6281 Muscle weakness (generalized): Secondary | ICD-10-CM | POA: Diagnosis not present

## 2023-11-17 ENCOUNTER — Other Ambulatory Visit: Payer: Self-pay | Admitting: Internal Medicine

## 2023-11-17 DIAGNOSIS — I1310 Hypertensive heart and chronic kidney disease without heart failure, with stage 1 through stage 4 chronic kidney disease, or unspecified chronic kidney disease: Secondary | ICD-10-CM

## 2023-11-23 DIAGNOSIS — M6281 Muscle weakness (generalized): Secondary | ICD-10-CM | POA: Diagnosis not present

## 2023-11-23 DIAGNOSIS — R262 Difficulty in walking, not elsewhere classified: Secondary | ICD-10-CM | POA: Diagnosis not present

## 2023-11-28 DIAGNOSIS — K838 Other specified diseases of biliary tract: Secondary | ICD-10-CM | POA: Diagnosis not present

## 2023-11-28 DIAGNOSIS — K754 Autoimmune hepatitis: Secondary | ICD-10-CM | POA: Diagnosis not present

## 2023-11-28 DIAGNOSIS — K766 Portal hypertension: Secondary | ICD-10-CM | POA: Diagnosis not present

## 2023-11-28 DIAGNOSIS — I8510 Secondary esophageal varices without bleeding: Secondary | ICD-10-CM | POA: Diagnosis not present

## 2023-11-29 ENCOUNTER — Encounter: Payer: Self-pay | Admitting: Internal Medicine

## 2023-11-30 ENCOUNTER — Other Ambulatory Visit: Payer: Self-pay

## 2023-11-30 DIAGNOSIS — M6281 Muscle weakness (generalized): Secondary | ICD-10-CM | POA: Diagnosis not present

## 2023-11-30 DIAGNOSIS — R262 Difficulty in walking, not elsewhere classified: Secondary | ICD-10-CM | POA: Diagnosis not present

## 2023-11-30 DIAGNOSIS — I1310 Hypertensive heart and chronic kidney disease without heart failure, with stage 1 through stage 4 chronic kidney disease, or unspecified chronic kidney disease: Secondary | ICD-10-CM

## 2023-11-30 MED ORDER — SPIRONOLACTONE 25 MG PO TABS: ORAL_TABLET | ORAL | 0 refills | Status: DC

## 2023-12-02 DIAGNOSIS — R262 Difficulty in walking, not elsewhere classified: Secondary | ICD-10-CM | POA: Diagnosis not present

## 2023-12-02 DIAGNOSIS — M6281 Muscle weakness (generalized): Secondary | ICD-10-CM | POA: Diagnosis not present

## 2023-12-06 ENCOUNTER — Ambulatory Visit

## 2023-12-06 ENCOUNTER — Encounter: Payer: Self-pay | Admitting: Internal Medicine

## 2023-12-06 NOTE — Progress Notes (Unsigned)
 Patient presents today for a bpc. Patient reports compliance with her meds. Patient reports she is taking amlodipine  5mg , valsartan  160mg  and spironolactone  25mg  in the mornings and she takes Carvedilol 6.25mg  and amlodipine  5mg  in the evenings. Patient's blood pressure was 116/60 P85. She reports she is no longer having any dizziness.     BP Readings from Last 3 Encounters:  10/25/23 122/80  09/27/23 130/70  09/21/23 (!) 142/80

## 2023-12-07 DIAGNOSIS — R262 Difficulty in walking, not elsewhere classified: Secondary | ICD-10-CM | POA: Diagnosis not present

## 2023-12-07 DIAGNOSIS — M6281 Muscle weakness (generalized): Secondary | ICD-10-CM | POA: Diagnosis not present

## 2023-12-09 ENCOUNTER — Other Ambulatory Visit: Payer: Self-pay

## 2023-12-09 DIAGNOSIS — M6281 Muscle weakness (generalized): Secondary | ICD-10-CM | POA: Diagnosis not present

## 2023-12-09 DIAGNOSIS — E7800 Pure hypercholesterolemia, unspecified: Secondary | ICD-10-CM

## 2023-12-09 DIAGNOSIS — R262 Difficulty in walking, not elsewhere classified: Secondary | ICD-10-CM | POA: Diagnosis not present

## 2023-12-09 DIAGNOSIS — I1310 Hypertensive heart and chronic kidney disease without heart failure, with stage 1 through stage 4 chronic kidney disease, or unspecified chronic kidney disease: Secondary | ICD-10-CM

## 2023-12-09 MED ORDER — ATORVASTATIN CALCIUM 80 MG PO TABS: 80.0000 mg | ORAL_TABLET | Freq: Every day | ORAL | 2 refills | Status: AC

## 2023-12-09 MED ORDER — SPIRONOLACTONE 25 MG PO TABS: ORAL_TABLET | ORAL | 0 refills | Status: DC

## 2023-12-09 MED ORDER — METFORMIN HCL 500 MG PO TABS: 500.0000 mg | ORAL_TABLET | Freq: Two times a day (BID) | ORAL | 0 refills | Status: DC

## 2023-12-13 ENCOUNTER — Encounter: Payer: Self-pay | Admitting: Internal Medicine

## 2023-12-14 DIAGNOSIS — R262 Difficulty in walking, not elsewhere classified: Secondary | ICD-10-CM | POA: Diagnosis not present

## 2023-12-14 DIAGNOSIS — M6281 Muscle weakness (generalized): Secondary | ICD-10-CM | POA: Diagnosis not present

## 2023-12-16 DIAGNOSIS — M6281 Muscle weakness (generalized): Secondary | ICD-10-CM | POA: Diagnosis not present

## 2023-12-16 DIAGNOSIS — R262 Difficulty in walking, not elsewhere classified: Secondary | ICD-10-CM | POA: Diagnosis not present

## 2023-12-20 ENCOUNTER — Encounter: Payer: Self-pay | Admitting: Internal Medicine

## 2023-12-20 ENCOUNTER — Ambulatory Visit (INDEPENDENT_AMBULATORY_CARE_PROVIDER_SITE_OTHER): Admitting: Gastroenterology

## 2023-12-20 ENCOUNTER — Encounter: Payer: Self-pay | Admitting: Gastroenterology

## 2023-12-20 VITALS — BP 150/70 | HR 67 | Ht 63.0 in | Wt 164.2 lb

## 2023-12-20 DIAGNOSIS — Z79899 Other long term (current) drug therapy: Secondary | ICD-10-CM

## 2023-12-20 DIAGNOSIS — Z860100 Personal history of colon polyps, unspecified: Secondary | ICD-10-CM | POA: Insufficient documentation

## 2023-12-20 DIAGNOSIS — D490 Neoplasm of unspecified behavior of digestive system: Secondary | ICD-10-CM

## 2023-12-20 DIAGNOSIS — Z860101 Personal history of adenomatous and serrated colon polyps: Secondary | ICD-10-CM

## 2023-12-20 DIAGNOSIS — K754 Autoimmune hepatitis: Secondary | ICD-10-CM | POA: Diagnosis not present

## 2023-12-20 NOTE — Progress Notes (Signed)
 GASTROENTEROLOGY OUTPATIENT CLINIC VISIT   Primary Care Provider Jarold Medici, MD [address] [place] [address] [phone]  Patient Profile: Marissa Orozco is a 78 y.o. female with a pmh significant for hypertension, hyperlipidemia, OSA, cataracts, fibroid, osteoporosis, nephrolithiasis, IPMN, autoimmune hepatitis.  The patient presents to the [facility] for an evaluation and management of problem(s) noted below:  Problem List 1. Autoimmune hepatitis (HCC)   2. IPMN (intraductal papillary mucinous neoplasm)   3. Hx of adenomatous colonic polyps   4. Medication course changed     History of Present Illness Please see prior GI notes for full details of HPI.  Interval History The patient returns for follow-up.  Since our endoscopic ultrasound we found that she had an IPMN, she has followed up with [facility] as well as with [facility] for her autoimmune hepatitis.  Plan for IPMN surveillance as per pancreaticobiliary surgery with her medical comorbidities and portal hypertension she was not felt to be an adequate candidate for surgical intervention.  She is been stable on her azathioprine  for her autoimmune hepatitis.  The patient denies any issues with jaundice, scleral icterus, generalized pruritus, darkened/amber urine, clay-colored stools, LE edema, hematemesis, coffee-ground emesis, abdominal distention, confusion.  She denies any changes in her bowel habits.  GI Review of Systems Positive as above Negative for dysphagia, odynophagia, nausea, vomiting, melena, hematochezia  Review of Systems General: Denies fevers/chills/weight loss unintentionally Cardiovascular: Denies chest pain Pulmonary: Denies shortness of breath Gastroenterological: See HPI Genitourinary: Denies darkened urine Hematological: Denies easy bruising/bleeding Dermatological: Denies jaundice Psychological: Mood is  stable  Medications Current Outpatient Medications  Medication Sig Dispense Refill   amLODipine  (NORVASC ) 5 MG tablet TAKE 1 TABLET BY MOUTH DAILY 90 tablet 3   ascorbic acid  (VITAMIN C ) 1000 MG tablet Take 1 tablet (1,000 mg total) by mouth daily. 100 tablet 0   aspirin  EC 81 MG tablet Take 1 tablet (81 mg total) by mouth daily. Swallow whole. 30 tablet 12   atorvastatin  (LIPITOR) 80 MG tablet Take 1 tablet (80 mg total) by mouth daily. 90 tablet 2   Calcium  Carbonate (CALCIUM  500 PO) Take 500 mg by mouth daily with breakfast.     carvedilol (COREG) 6.25 MG tablet Take 6.25 mg by mouth.     Cholecalciferol (VITAMIN D3) 50 MCG (2000 UT) TABS Take 2,000 Units by mouth daily with breakfast.     dorzolamide -timolol  (COSOPT ) 22.3-6.8 MG/ML ophthalmic solution Place 1 drop into both eyes in the morning and at bedtime.     ezetimibe  (ZETIA ) 10 MG tablet Take 1 tablet (10 mg total) by mouth daily. 90 tablet 3   latanoprost  (XALATAN ) 0.005 % ophthalmic solution Place 1 drop into both eyes at bedtime.     metFORMIN  (GLUCOPHAGE ) 500 MG tablet Take 1 tablet (500 mg total) by mouth 2 (two) times daily with a meal. 180 tablet 0   pantoprazole  (PROTONIX ) 40 MG tablet Take 1 tablet (40 mg total) by mouth daily. 90 tablet 3   spironolactone  (ALDACTONE ) 25 MG tablet Take 1/2 tablet po daily 45 tablet 0   valsartan  (DIOVAN ) 160 MG tablet TAKE 1 TABLET BY MOUTH DAILY 90 tablet 3   azaTHIOprine  (IMURAN ) 50 MG tablet Take 50 mg by mouth daily.     [Paused] finasteride (PROSCAR) 5 MG tablet Take 5 mg by mouth daily. (Patient not taking: Reported on 10/25/2023)     No current facility-administered medications for this visit.    Allergies Allergies[1]  Histories Past Medical History:  Diagnosis Date   Allergy 2000   Breast lump    Cataract    Colon polyps    Fibroids    GERD (gastroesophageal reflux disease)    Glaucoma    Heart murmur    Hyperlipidemia 01/05/1984   Hypertension 01/05/1984    Interstitial cystitis 01/04/1997   Kidney stones 01/05/2003   Neuromuscular disorder (HCC)    Osteoporosis    Preop cardiovascular exam 10/25/2022   Sleep apnea 2021   Tonsillitis    Past Surgical History:  Procedure Laterality Date   ABDOMINAL HYSTERECTOMY  1997   BREAST EXCISIONAL BIOPSY Right 07/27/2006   Intraductal Papilloma   BREAST SURGERY  2008   CATARACT EXTRACTION Bilateral 02/2020   other in 05/2020   COLONOSCOPY  2010   Dr Kristie   ESOPHAGOGASTRODUODENOSCOPY N/A 09/15/2023   Procedure: EGD (ESOPHAGOGASTRODUODENOSCOPY);  Surgeon: Wilhelmenia Aloha Raddle., MD;  Location: [facility];  Service: Gastroenterology;  Laterality: N/A;   EUS N/A 09/15/2023   Procedure: ULTRASOUND, UPPER GI TRACT, ENDOSCOPIC;  Surgeon: Wilhelmenia Aloha Raddle., MD;  Location: [facility];  Service: Gastroenterology;  Laterality: N/A;   EYE SURGERY  2022   FINE NEEDLE ASPIRATION  09/15/2023   Procedure: FINE NEEDLE ASPIRATION;  Surgeon: Wilhelmenia Aloha Raddle., MD;  Location: [facility];  Service: Gastroenterology;;   HYSTEROSCOPY  1993   IR FLUORO GUIDE CV LINE RIGHT  06/24/2023   IR REMOVAL TUN CV CATH W/O FL  07/04/2023   IR US  GUIDE VASC ACCESS RIGHT  06/24/2023   LAPAROSCOPIC HYSTERECTOMY  1994   MYOMECTOMY  1993   excision of tumor   POLYPECTOMY     TONSILLECTOMY  1953   TUBAL LIGATION  1980   Social History   Socioeconomic History   Marital status: Married    Spouse name: Not on file   Number of children: 2   Years of education: Not on file   Highest education level: Doctorate  Occupational History   Occupation: retired   Occupation: retired  Tobacco Use   Smoking status: Never   Smokeless tobacco: Never  Vaping Use   Vaping status: Never Used  Substance and Sexual Activity   Alcohol use: Not Currently    Alcohol/week: 5.0 standard drinks of alcohol    Types: 5 Glasses of wine per week   Drug use: No   Sexual activity: Not Currently  Other Topics Concern   Not on file   Social History Narrative   Not on file   Social Drivers of Health   Tobacco Use: Low Risk (12/20/2023)   Patient History    Smoking Tobacco Use: Never    Smokeless Tobacco Use: Never    Passive Exposure: Not on file  Financial Resource Strain: Low Risk (10/21/2023)   Overall Financial Resource Strain (CARDIA)    Difficulty of Paying Living Expenses: Not hard at all  Food Insecurity: No Food Insecurity (10/21/2023)   Epic    Worried About Programme Researcher, Broadcasting/film/video in the Last Year: Never true    Ran Out of Food in the Last Year: Never true  Transportation Needs: No Transportation Needs (10/21/2023)   Epic    Lack of Transportation (Medical): No    Lack of Transportation (Non-Medical): No  Physical Activity: Insufficiently Active (10/21/2023)   Exercise Vital Sign    Days of Exercise per Week: 2 days    Minutes of Exercise per Session: 40 min  Stress: No Stress Concern Present (10/21/2023)   [place]  [employer] of Occupational Health - Occupational Stress Questionnaire    Feeling of Stress: Not at all  Social Connections: Unknown (10/21/2023)   Social Connection and Isolation Panel    Frequency of Communication with Friends and Family: Three times a week    Frequency of Social Gatherings with Friends and Family: Twice a week    Attends Religious Services: Never    Database Administrator or Organizations: Not on file    Attends Banker Meetings: Not on file    Marital Status: Living with partner  Recent Concern: Social Connections - Moderately Isolated (09/21/2023)   Social Connection and Isolation Panel    Frequency of Communication with Friends and Family: Three times a week    Frequency of Social Gatherings with Friends and Family: Twice a week    Attends Religious Services: Never    Database Administrator or Organizations: No    Attends Banker Meetings: Never    Marital Status: Married  Catering Manager Violence: Not At Risk (09/21/2023)   Epic     Fear of Current or Ex-Partner: No    Emotionally Abused: No    Physically Abused: No    Sexually Abused: No  Depression (PHQ2-9): Low Risk (09/21/2023)   Depression (PHQ2-9)    PHQ-2 Score: 0  Alcohol Screen: Low Risk (09/21/2023)   Alcohol Screen    Last Alcohol Screening Score (AUDIT): 0  Housing: Low Risk (10/21/2023)   Epic    Unable to Pay for Housing in the Last Year: No    Number of Times Moved in the Last Year: 0    Homeless in the Last Year: No  Utilities: Not At Risk (09/21/2023)   Epic    Threatened with loss of utilities: No  Health Literacy: Adequate Health Literacy (09/21/2023)   B1300 Health Literacy    Frequency of need for help with medical instructions: Never   Family History  Problem Relation Age of Onset   Hypertension Mother    Heart disease Mother    Colon cancer Mother 65   Arthritis Mother    Cancer Mother    Coronary artery disease Father        mother, PGM   Stroke Father    Hypertension Father    Colon cancer Father 84   Cancer Father    Colon cancer Maternal Grandmother    Diabetes Other    Breast cancer Neg Hx    Colon polyps Neg Hx    Esophageal cancer Neg Hx    Stomach cancer Neg Hx    Rectal cancer Neg Hx    BRCA 1/2 Neg Hx    Inflammatory bowel disease Neg Hx    Liver disease Neg Hx    Pancreatic cancer Neg Hx    I have reviewed her medical, social, and family history in detail and updated the electronic medical record as necessary.    PHYSICAL EXAMINATION  BP (!) 150/70   Pulse 67   Ht 5' 3 (1.6 m)   Wt 164 lb 4 oz (74.5 kg)   BMI 29.10 kg/m  Wt Readings from Last 3 Encounters:  12/20/23 164 lb 4 oz (74.5 kg)  10/25/23 174 lb 9.6 oz (79.2 kg)  09/27/23 170 lb (77.1 kg)  GEN: NAD, appears stated age, doesn't appear chronically ill PSYCH: Cooperative, without pressured speech EYE: Conjunctivae pink, sclerae anicteric ENT: MMM CV: Nontachycardic RESP: No audible wheezing GI: NABS, soft, NT/ND, without rebound MSK/EXT: No  significant  lower extremity edema SKIN: No jaundice NEURO:  Alert & Oriented x 3, no focal deficits   REVIEW OF DATA  I reviewed the following data at the time of this encounter:  GI Procedures and Studies  Upper EUS September 2025 EGD impression: - No gross lesions in the proximal esophagus and in the mid esophagus. Grade I esophageal varices noted distally. Z-line irregular, 37 cm from the incisors. - 3 cm hiatal hernia. - Erythematous mucosa in the stomach. Biopsied. - No gross lesions in the duodenal bulb, in the first portion of the duodenum and in the second portion of the duodenum. - Normal major papilla.  EUS impression: - A cystic lesion was seen in the pancreatic head/uncinate process. Cytology results are pending. However, the endosonographic appearance is suggestive of an intraductal papillary mucinous neoplasm. Fine needle aspiration for fluid performed. - Pancreatic parenchymal abnormalities consisting of hyperechoic strands were noted in the entire pancreas. - There was dilation in the common bile duct and in the common hepatic duct. The gallbladder was unremarkable. - No malignant-appearing lymph nodes were visualized in the celiac region (level 20), peripancreatic region and porta hepatis region.  Laboratory Studies  Reviewed those in epic  Imaging Studies  June 2025 MRCP IMPRESSION: 1. Abnormal heterogeneous appearance of the liver with multifocal scattered areas of relative increased T2 signal throughout both lobes of liver with increased T2 periportal signal. Imaging findings are nonspecific likely reflect hepatic inflammation, edema, early infiltrative disease or evolving hepatocellular injury. Underlying infiltrative tumor would be difficult to exclude without the benefit of IV contrast material. 2. Fusiform dilatation of the common bile duct measures up to 1 cm. There is moderate intrahepatic dilatation to the left lobe. Mild right lobe of liver biliary dilatation.  No signs of choledocholithiasis. 3. Numerous cysts identified within the dominant cyst is centered around the uncinate process measuring 5.2 x 4.6 cm. Findings may reflect mucinous cystic neoplasm versus side branch IPMN. Recommend referral to gastroenterology for further management. 4. Enlarged porta hepatic lymph node measures 1.3 cm. 5. Small bilateral pleural effusions. 6. Lesion off the anterior cortex of the upper pole of the right kidney measures 0.9 cm. 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   ASSESSMENT  Ms. Hartzell is a 78 y.o. female.  The patient is seen today for evaluation and management of:  1. Autoimmune hepatitis (HCC)   2. IPMN (intraductal papillary mucinous neoplasm)   3. Hx of adenomatous colonic polyps   4. Medication course changed    The patient is hemodynamically and clinically stable at this time.  Her liver disease is being monitored and followed with [facility].  She will have repeat imaging to follow-up her IPMN as well.  Will allow them to monitor for [facility] screening.  Liver biochemical testing had improved, she will maintain her current azathioprine  dosing which is also being taken care of by [facility] hepatology.  I will plan a repeat upper endoscopy in 3 years for her small grade 1 varices, but good that she is on carvedilol at this time.  Not clear if the patient is going to have any GERD symptoms when she comes off PPI and she would like to try that.  She will update us  depending on how she does whether she needs to continue this or not based on symptomatology.  Appreciate pancreaticobiliary surgery at [facility] monitoring patient to help make long-term decisions in regards to the IPMN that was found.  For now we will allow them to take over at this point.  She will be seeing her PCP in the coming weeks.  We will get updated MELD labs at that time (orders placed for future state).   All patient questions were answered to the best of my ability, and the patient agrees to the aforementioned plan of action with follow-up as indicated.   PLAN  Laboratories as outlined below Appreciate [facility] - Continue azathioprine  as per their dosing - HCC screening as per their timing - Continue carvedilol Appreciate [facility] pancreatic[facility]iliary surgery - Imaging repeat as per Dr. Debora Consideration of repeat colonoscopy in 5 years pending patient's overall health at the time Repeat upper endoscopy in 2028   Orders Placed This Encounter  Procedures   CBC   Comp Met (CMET)   INR/PT    New Prescriptions   No medications on file   Modified Medications   No medications on file    Planned Follow Up No follow-ups on file.   Total Time in Face-to-Face and in Coordination of Care for patient including independent/personal interpretation/review of prior testing, medical history, examination, medication adjustment, communicating results with the patient directly, and documentation within the EHR is 30 minutes.   Aloha Finner, MD [facility] Office # [phone]     [1]  Allergies Allergen Reactions   Hydrochlorothiazide Itching

## 2023-12-20 NOTE — Patient Instructions (Addendum)
 Plan to discontinue your Pantoprazole  for the next few weeks. If doing well without taking , let us  know. Otherwise go back on your Pantoprazole  if you become symptomatic.   Please have labs done in Jan 2026 as outlined with your Primary Care Physician.  Follow-up with Dr Federico afterwards.   Happy Holidays!   _______________________________________________________  If your blood pressure at your visit was 140/90 or greater, please contact your primary care physician to follow up on this.  _______________________________________________________  If you are age 79 or older, your body mass index should be between 23-30. Your Body mass index is 29.1 kg/m. If this is out of the aforementioned range listed, please consider follow up with your Primary Care Provider.  If you are age 79 or younger, your body mass index should be between 19-25. Your Body mass index is 29.1 kg/m. If this is out of the aformentioned range listed, please consider follow up with your Primary Care Provider.   ________________________________________________________  The [facility] providers would like to encourage you to use MYCHART to communicate with providers for non-urgent requests or questions.  Due to long hold times on the telephone, sending your provider a message by [facility] may be a faster and more efficient way to get a response.  Please allow 48 business hours for a response.  Please remember that this is for non-urgent requests.  _______________________________________________________  [facility] is using a team-based approach to care.  Your team is made up of your doctor and two to three APPS. Our APPS (Nurse Practitioners and Physician Assistants) work with your physician to ensure care continuity for you. They are fully qualified to address your health concerns and develop a treatment plan. They communicate directly with your gastroenterologist to care for you. Seeing the Advanced Practice  Practitioners on your physician's team can help you by facilitating care more promptly, often allowing for earlier appointments, access to diagnostic testing, procedures, and other specialty referrals.   Thank you for choosing me and [facility].  Dr. Wilhelmenia

## 2023-12-21 DIAGNOSIS — M6281 Muscle weakness (generalized): Secondary | ICD-10-CM | POA: Diagnosis not present

## 2023-12-21 DIAGNOSIS — R262 Difficulty in walking, not elsewhere classified: Secondary | ICD-10-CM | POA: Diagnosis not present

## 2023-12-27 ENCOUNTER — Encounter: Payer: Self-pay | Admitting: Internal Medicine

## 2023-12-31 ENCOUNTER — Emergency Department (HOSPITAL_COMMUNITY): Admission: EM | Admit: 2023-12-31 | Discharge: 2024-01-01

## 2023-12-31 ENCOUNTER — Encounter (HOSPITAL_COMMUNITY): Payer: Self-pay

## 2023-12-31 ENCOUNTER — Other Ambulatory Visit: Payer: Self-pay

## 2023-12-31 DIAGNOSIS — R42 Dizziness and giddiness: Secondary | ICD-10-CM | POA: Insufficient documentation

## 2023-12-31 DIAGNOSIS — Z5321 Procedure and treatment not carried out due to patient leaving prior to being seen by health care provider: Secondary | ICD-10-CM | POA: Insufficient documentation

## 2023-12-31 DIAGNOSIS — I10 Essential (primary) hypertension: Secondary | ICD-10-CM | POA: Diagnosis not present

## 2023-12-31 LAB — BASIC METABOLIC PANEL WITH GFR
Anion gap: 11 (ref 5–15)
BUN: 19 mg/dL (ref 8–23)
CO2: 22 mmol/L (ref 22–32)
Calcium: 9.6 mg/dL (ref 8.9–10.3)
Chloride: 109 mmol/L (ref 98–111)
Creatinine, Ser: 0.86 mg/dL (ref 0.44–1.00)
GFR, Estimated: >60 mL/min
Glucose, Bld: 146 mg/dL — ABNORMAL HIGH (ref 70–99)
Potassium: 3.7 mmol/L (ref 3.5–5.1)
Sodium: 142 mmol/L (ref 135–145)

## 2023-12-31 LAB — CBC
HCT: 38.8 % (ref 36.0–46.0)
Hemoglobin: 12.7 g/dL (ref 12.0–15.0)
MCH: 31.5 pg (ref 26.0–34.0)
MCHC: 32.7 g/dL (ref 30.0–36.0)
MCV: 96.3 fL (ref 80.0–100.0)
Platelets: 264 K/uL (ref 150–400)
RBC: 4.03 MIL/uL (ref 3.87–5.11)
RDW: 14.1 % (ref 11.5–15.5)
WBC: 9.3 K/uL (ref 4.0–10.5)
nRBC: 0 % (ref 0.0–0.2)

## 2023-12-31 NOTE — ED Provider Triage Note (Signed)
 Emergency Medicine Provider Triage Evaluation Note  Marissa Orozco , a 78 y.o. female  was evaluated in triage.  Pt complains of hypotension.  Patient was hypotensive at home which caused her to feel lightheaded, denies syncope.  Reports symptoms resolved after about 15 minutes and have not returned.  She is on multiple medications for management of her hypertension, which she takes daily as prescribed.  Denies chest pain or shortness of breath.  Review of Systems  Positive: As above Negative: As above  Physical Exam  BP 123/70   Pulse 65   Temp 97.6 F (36.4 C)   Resp 16   Ht 5' 3 (1.6 m)   Wt 71.2 kg   SpO2 100%   BMI 27.81 kg/m  Gen:   Awake, no distress   Resp:  Normal effort  MSK:   Moves extremities without difficulty  Other:    Medical Decision Making  Medically screening exam initiated at 8:38 PM.  Appropriate orders placed.  Marissa Orozco was informed that the remainder of the evaluation will be completed by another provider, this initial triage assessment does not replace that evaluation, and the importance of remaining in the ED until their evaluation is complete.     Marissa Orozco, [place] 12/31/23 2040

## 2023-12-31 NOTE — ED Triage Notes (Signed)
 Pt reports came to ED because bp was low at home, pt felt dizzy at that time, pt currently denies dizziness, voicing no complaints currently.   Reports hx of HTN and takes medication as prescribed

## 2023-12-31 NOTE — ED Triage Notes (Signed)
 PT states she takes her bp daily because she takes bp meds.  Her pressure this evening was 61/40 and she felt dizzy.  No changes in medication dosages.    BP wnl at this time.  Pt denies dizziness presently.

## 2024-01-01 ENCOUNTER — Encounter: Payer: Self-pay | Admitting: Internal Medicine

## 2024-01-01 NOTE — ED Notes (Signed)
 Patient stated she was leaving. She feeling better now.

## 2024-01-10 ENCOUNTER — Encounter: Payer: Self-pay | Admitting: Gastroenterology

## 2024-01-11 ENCOUNTER — Encounter: Payer: Self-pay | Admitting: Internal Medicine

## 2024-01-11 ENCOUNTER — Ambulatory Visit: Payer: Self-pay | Admitting: Internal Medicine

## 2024-01-11 VITALS — BP 110/80 | HR 68 | Temp 98.1°F | Ht 63.0 in | Wt 161.8 lb

## 2024-01-11 DIAGNOSIS — K754 Autoimmune hepatitis: Secondary | ICD-10-CM

## 2024-01-11 DIAGNOSIS — I6523 Occlusion and stenosis of bilateral carotid arteries: Secondary | ICD-10-CM | POA: Diagnosis not present

## 2024-01-11 DIAGNOSIS — I1310 Hypertensive heart and chronic kidney disease without heart failure, with stage 1 through stage 4 chronic kidney disease, or unspecified chronic kidney disease: Secondary | ICD-10-CM

## 2024-01-11 DIAGNOSIS — E7800 Pure hypercholesterolemia, unspecified: Secondary | ICD-10-CM | POA: Diagnosis not present

## 2024-01-11 DIAGNOSIS — R7303 Prediabetes: Secondary | ICD-10-CM | POA: Diagnosis not present

## 2024-01-11 DIAGNOSIS — R945 Abnormal results of liver function studies: Secondary | ICD-10-CM

## 2024-01-11 DIAGNOSIS — R7401 Elevation of levels of liver transaminase levels: Secondary | ICD-10-CM

## 2024-01-11 DIAGNOSIS — R600 Localized edema: Secondary | ICD-10-CM | POA: Diagnosis not present

## 2024-01-11 DIAGNOSIS — D490 Neoplasm of unspecified behavior of digestive system: Secondary | ICD-10-CM

## 2024-01-11 DIAGNOSIS — N182 Chronic kidney disease, stage 2 (mild): Secondary | ICD-10-CM | POA: Diagnosis not present

## 2024-01-11 DIAGNOSIS — Z79899 Other long term (current) drug therapy: Secondary | ICD-10-CM

## 2024-01-11 DIAGNOSIS — Z860101 Personal history of adenomatous and serrated colon polyps: Secondary | ICD-10-CM

## 2024-01-11 DIAGNOSIS — K862 Cyst of pancreas: Secondary | ICD-10-CM

## 2024-01-11 NOTE — Patient Instructions (Signed)
 Prediabetes: What to Know Prediabetes is when your blood sugar, also called glucose, is at a higher level than normal but not high enough for you to be diagnosed with type 2 diabetes (type 2 diabetes mellitus). Having prediabetes puts you at risk for getting type 2 diabetes. By making some healthy changes, you may be able to prevent or delay getting type 2 diabetes. This is important because type 2 diabetes can lead to serious problems. Some of these include: Heart disease. Stroke. Blindness. Kidney disease. Depression. Poor blood flow in the feet and legs. In very bad cases, this could lead to having a leg removed by surgery (amputation). What are the causes? The exact cause of prediabetes isn't known. It may result from insulin resistance. Insulin resistance happens when cells in the body don't respond properly to insulin that the body makes. This can cause too much sugar to build up in the blood. High blood sugar, also called hyperglycemia, can develop. What increases the risk? Having a family member with type 2 diabetes. Being older than 80 years of age. Having had a temporary form of diabetes during a pregnancy. This is called gestational diabetes. Having had polycystic ovary syndrome (PCOS). Being overweight or obese. Being inactive and not getting much exercise. Having a history of heart disease. This may include problems with cholesterol levels, high levels of blood fats, or high blood pressure. What are the signs or symptoms? You may have no symptoms. If you do have symptoms, they may include: Increased hunger. Increased thirst. Needing to pee more often. Changes in how you see, like blurry vision. Feeling tired. How is this diagnosed? Prediabetes can be diagnosed with blood tests that check your blood sugar. One or more of these tests may be done: A fasting blood glucose (FBG) test. You won't be allowed to eat (you will fast) for at least 8 hours before a blood sample is  taken. An A1C blood test, also called a hemoglobin A1C test. This test shows information about blood sugar levels over the past 2?3 months. An oral glucose tolerance test (OGTT). This test measures your blood sugar at two points in time: After you haven't eaten for a while. This is your baseline level. Two hours after you drink a beverage that has sugar in it. You may be diagnosed with prediabetes if: Your FBG is 100?125 mg/dL (1.6-1.0 mmol/L). Your A1C level is 5.7?6.4% (39-46 mmol/mol). Your OGTT result is 140?199 mg/dL (9.6-04 mmol/L). These blood tests may need to be done again to be sure of the diagnosis. How is this treated? Treatment may include making changes to your diet and lifestyle. These changes can help lower your blood sugar and keep you from getting type 2 diabetes. In some cases, medicine may be given to help lower your risk. Follow these instructions at home: Eating and drinking  Eat and drink as told. Follow a healthy meal plan. This includes eating lean proteins, whole grains, legumes, fresh fruits and vegetables, low-fat dairy products, and healthy fats. Meet with an expert in healthy eating called a dietitian. This person can help create a healthy eating plan that's right for you. Lifestyle Do moderate-intensity exercise. Do this for at least 30 minutes a day on 5 or more days each week, or as told by your health care provider. A mix of activities may be best. Good choices include brisk walking, swimming, biking, and weight lifting. Try to lose weight if your provider says it's OK. Losing 5-7% of your body weight can  help reverse insulin resistance. Do not drink alcohol if: Your provider tells you not to drink. You're pregnant, may be pregnant, or plan to become pregnant. If you drink alcohol: Limit how much you have to: 0-1 drink a day if you're female. 0-2 drinks a day if you're female. Know how much alcohol is in your drink. In the [place] one drink is one 12 oz  bottle of beer (355 mL), one 5 oz glass of wine (148 mL), or one 1 oz glass of hard liquor (44 mL). General instructions Take medicines only as told. You may be given medicines that help lower the risk of type 2 diabetes. Do not smoke, vape, or use nicotine or tobacco. Where to find more information American Diabetes Association: diabetes.org/about-diabetes/prediabetes Academy of Nutrition and Dietetics: eatright.org American Heart Association: Go to ThisJobs.cz. Click the search icon. Type "prediabetes" in the search box. Contact a health care provider if: You have any of these symptoms: Increased hunger. Peeing more often than usual. Increased thirst. Feeling tired. Changes in how you see, like blurry vision. Feeling like you may throw up. Throwing up. Get help right away if: You have shortness of breath. You feel confused. This information is not intended to replace advice given to you by your health care provider. Make sure you discuss any questions you have with your health care provider. Document Revised: 07/25/2022 Document Reviewed: 07/25/2022 Elsevier Patient Education  2024 [employer].

## 2024-01-11 NOTE — Progress Notes (Signed)
 I,Victoria T Emmitt, CMA,acting as a neurosurgeon for Marissa LOISE Slocumb, MD.,have documented all relevant documentation on the behalf of Marissa LOISE Slocumb, MD,as directed by  Marissa LOISE Slocumb, MD while in the presence of Marissa LOISE Slocumb, MD.  Subjective:  Patient ID: Marissa Orozco , female    DOB: [birth year] , 79 y.o.   MRN: [id number]  Chief Complaint  Patient presents with   Prediabetes    Patient presents today for predm , bp & cholesterol follow up. She reports compliance with medications. Denies headache, chest pain & sob.  She wants to discuss discontinuing amlodipine . She still experiences ankle swelling.  She states two weeks ago her bp dropping to 61/45. She admits at the time experiencing dizziness. She went to [facility] & after being triaged she states feeling better by just sitting and waiting. Denies having anymore episodes since then.    Hypertension   Hyperlipidemia    HPI Discussed the use of AI scribe software for clinical note transcription with the patient, who gave verbal consent to proceed.  History of Present Illness Marissa Orozco is a 79 year old female with hypertension and prediabetes who presents for follow-up of blood pressure management and recent emergency room visit for hypotension.  She is accompanied by her husband.  She experienced a second episode of hypotension, with the first incident occurring at home where her blood pressure was 61/50 mmHg. During the recent ER visit, her blood pressure had stabilized by the time she arrived. She was given fluids during the first incident. She is currently taking amlodipine  10 mg daily and valsartan , but there is consideration of reducing the amlodipine  dose due to occasional low readings.  She experiences lightheadedness and a sensation of impending syncope, which prompted the ER visit. Her blood pressure readings at home have varied, with some readings being higher than 120/80 mmHg. She drinks approximately six to seven  cups of fluid daily and has started using a treadmill as per physical therapy instructions.  She experienced an episode of vomiting on Christmas morning after eating a waffle, but did not feel nauseous prior to vomiting. Her blood pressure at that time was 110/70 mmHg. She has not had any further episodes of low blood pressure since the ER visit.  She reports swelling in her legs, causing soreness and difficulty wearing shoes. She uses compression hose and performs calf raises to help reduce swelling. She has been taking amlodipine  twice daily.  Her medication regimen includes metformin  for prediabetes and she has recently restarted cholesterol medication. She has a history of autoimmune hepatitis and is on azathioprine  to manage inflammation. She is also taking carvedilol.   Hypertension This is a chronic problem. The current episode started more than 1 year ago. The problem is controlled. Pertinent negatives include no blurred vision, chest pain, palpitations or shortness of breath. Risk factors for coronary artery disease include dyslipidemia and post-menopausal state. Past treatments include ACE inhibitors. The current treatment provides moderate improvement.     Past Medical History:  Diagnosis Date   Allergy 2000   Breast lump    Cataract    Colon polyps    Fibroids    GERD (gastroesophageal reflux disease)    Glaucoma    Heart murmur    Hyperlipidemia 01/05/1984   Hypertension 01/05/1984   Interstitial cystitis 01/04/1997   Kidney stones 01/05/2003   Neuromuscular disorder (HCC)    Osteoporosis    Preop cardiovascular exam 10/25/2022   Sleep apnea 2021  Tonsillitis      Family History  Problem Relation Age of Onset   Hypertension Mother    Heart disease Mother    Colon cancer Mother 75   Arthritis Mother    Cancer Mother    Coronary artery disease Father        mother, PGM   Stroke Father    Hypertension Father    Colon cancer Father 56   Cancer Father    Colon  cancer Maternal Grandmother    Diabetes Other    Breast cancer Neg Hx    Colon polyps Neg Hx    Esophageal cancer Neg Hx    Stomach cancer Neg Hx    Rectal cancer Neg Hx    BRCA 1/2 Neg Hx    Inflammatory bowel disease Neg Hx    Liver disease Neg Hx    Pancreatic cancer Neg Hx      Current Outpatient Medications:    ascorbic acid  (VITAMIN C ) 1000 MG tablet, Take 1 tablet (1,000 mg total) by mouth daily., Disp: 100 tablet, Rfl: 0   aspirin  EC 81 MG tablet, Take 1 tablet (81 mg total) by mouth daily. Swallow whole., Disp: 30 tablet, Rfl: 12   atorvastatin  (LIPITOR) 80 MG tablet, Take 1 tablet (80 mg total) by mouth daily., Disp: 90 tablet, Rfl: 2   azaTHIOprine  (IMURAN ) 50 MG tablet, Take 50 mg by mouth daily., Disp: , Rfl:    Calcium  Carbonate (CALCIUM  500 PO), Take 500 mg by mouth daily with breakfast., Disp: , Rfl:    carvedilol (COREG) 6.25 MG tablet, Take 6.25 mg by mouth., Disp: , Rfl:    Cholecalciferol (VITAMIN D3) 50 MCG (2000 UT) TABS, Take 2,000 Units by mouth daily with breakfast., Disp: , Rfl:    dorzolamide -timolol  (COSOPT ) 22.3-6.8 MG/ML ophthalmic solution, Place 1 drop into both eyes in the morning and at bedtime., Disp: , Rfl:    ezetimibe  (ZETIA ) 10 MG tablet, Take 1 tablet (10 mg total) by mouth daily., Disp: 90 tablet, Rfl: 3   [Paused] finasteride (PROSCAR) 5 MG tablet, Take 5 mg by mouth daily., Disp: , Rfl:    latanoprost  (XALATAN ) 0.005 % ophthalmic solution, Place 1 drop into both eyes at bedtime., Disp: , Rfl:    metFORMIN  (GLUCOPHAGE ) 500 MG tablet, Take 1 tablet (500 mg total) by mouth 2 (two) times daily with a meal., Disp: 180 tablet, Rfl: 0   pantoprazole  (PROTONIX ) 40 MG tablet, Take 1 tablet (40 mg total) by mouth daily., Disp: 90 tablet, Rfl: 3   spironolactone  (ALDACTONE ) 25 MG tablet, Take 1/2 tablet po daily, Disp: 45 tablet, Rfl: 0   valsartan  (DIOVAN ) 160 MG tablet, TAKE 1 TABLET BY MOUTH DAILY, Disp: 90 tablet, Rfl: 3   amLODipine  (NORVASC ) 5 MG  tablet, TAKE 1 TABLET BY MOUTH DAILY, Disp: 90 tablet, Rfl: 3   Allergies  Allergen Reactions   Hydrochlorothiazide Itching     Review of Systems  Constitutional: Negative.   Eyes:  Negative for blurred vision.  Respiratory: Negative.  Negative for shortness of breath.   Cardiovascular: Negative.  Negative for chest pain and palpitations.  Gastrointestinal: Negative.   Neurological: Negative.   Psychiatric/Behavioral: Negative.       Today's Vitals   01/11/24 1009  BP: 110/80  Pulse: 68  Temp: 98.1 F (36.7 C)  SpO2: 98%  Weight: 161 lb 12.8 oz (73.4 kg)  Height: 5' 3 (1.6 m)   Body mass index is 28.66 kg/m.  Wt Readings from  Last 3 Encounters:  01/11/24 161 lb 12.8 oz (73.4 kg)  12/31/23 157 lb (71.2 kg)  12/20/23 164 lb 4 oz (74.5 kg)    The ASCVD Risk score (Arnett DK, et al., 2019) failed to calculate for the following reasons:   The 2019 ASCVD risk score is only valid for ages 24 to 59   * - Cholesterol units were assumed  Objective:  Physical Exam Vitals and nursing note reviewed.  Constitutional:      Appearance: Normal appearance.  HENT:     Head: Normocephalic and atraumatic.  Eyes:     Extraocular Movements: Extraocular movements intact.  Cardiovascular:     Rate and Rhythm: Normal rate and regular rhythm.     Heart sounds: Normal heart sounds.  Pulmonary:     Effort: Pulmonary effort is normal.     Breath sounds: Normal breath sounds.  Musculoskeletal:     Cervical back: Normal range of motion.     Right lower leg: Edema present.     Left lower leg: Edema present.  Skin:    General: Skin is warm.  Neurological:     General: No focal deficit present.     Mental Status: She is alert.  Psychiatric:        Mood and Affect: Mood normal.        Behavior: Behavior normal.         Assessment And Plan:   Assessment & Plan Prediabetes Previous labs reviewed, her A1c has been elevated in the past. I will check an A1c today. Reminded to avoid  refined sugars including sugary drinks/foods and processed meats including bacon, sausages and deli meats.  - Continue with metformin  Hypertensive heart and renal disease with renal failure, stage 1 through stage 4 or unspecified chronic kidney disease, without heart failure Blood pressure variable with occasional hypotension. Amlodipine  reduction considered due to hypotension and edema. Discussed potential switch to hydralazine. - Reduced amlodipine  to half a pill daily using a pill cutter. - Instructed to maintain a blood pressure log for hypertension clinic appointment. - Ordered BNP to assess heart function. - Continue valsartan  as prescribed. - Will discuss potential switch to hydralazine with hypertension clinic if amlodipine  is discontinued. Atherosclerosis of both carotid arteries Chronic, target LDL should be less than 70 based on carotid and coronary plaque. Atorvastatin  currently on hold.  Encouraged to follow heart healthy lifestyle.  Chronic kidney disease, stage II (mild) Chronic, she is encouraged to keep BP well controlled and to stay well hydrated to decrease risk of CKD progression.    Pure hypercholesterolemia Cholesterol management discussed. Restart of medication approved by hepatologist.  IPMN (intraductal papillary mucinous neoplasm) IPMN confirmed. No longer planning for surgery. - Continue surveillance. Bilateral lower extremity edema I will check BNP.   Edema likely due to amlodipine . Discussed potential switch to hydralazine to reduce edema. - Continue wearing compression hose. - Perform calf raises during the day to aid venous return. - Will discuss potential switch to hydralazine (if needed for BP control) with hypertension clinic if amlodipine  is discontinued. - Consider adding furosemide  20mg  3x/week if sx persist.  Hx of adenomatous colonic polyps  Autoimmune hepatitis (HCC) Managed with azathioprine . Liver function monitoring ongoing. - Continue  azathioprine  as per hepatologist's recommendations. - Ordered liver function tests, blood count, and INR as per hepatologist's orders.    Orders Placed This Encounter  Procedures   Hemoglobin A1c   Brain natriuretic peptide   CBC no Diff   Comprehensive metabolic  panel with GFR   Protime-INR     Return for 4 month predm f/u.SABRA  Patient was given opportunity to ask questions. Patient verbalized understanding of the plan and was able to repeat key elements of the plan. All questions were answered to their satisfaction.    I, Marissa LOISE Slocumb, MD, have reviewed all documentation for this visit. The documentation on 01/22/24 for the exam, diagnosis, procedures, and orders are all accurate and complete.   IF YOU HAVE BEEN REFERRED TO A SPECIALIST, IT MAY TAKE 1-2 WEEKS TO SCHEDULE/PROCESS THE REFERRAL. IF YOU HAVE NOT HEARD FROM US /SPECIALIST IN TWO WEEKS, PLEASE GIVE US  A CALL AT [phone] X [phone].

## 2024-01-11 NOTE — Assessment & Plan Note (Addendum)
 Chronic, target LDL should be less than 70 based on carotid and coronary plaque. Atorvastatin  currently on hold.  Encouraged to follow heart healthy lifestyle.

## 2024-01-11 NOTE — Assessment & Plan Note (Addendum)
 Previous labs reviewed, her A1c has been elevated in the past. I will check an A1c today. Reminded to avoid refined sugars including sugary drinks/foods and processed meats including bacon, sausages and deli meats.  - Continue with metformin 

## 2024-01-12 LAB — CBC
Hematocrit: 37.8 % (ref 34.0–46.6)
Hemoglobin: 12.5 g/dL (ref 11.1–15.9)
MCH: 31.3 pg (ref 26.6–33.0)
MCHC: 33.1 g/dL (ref 31.5–35.7)
MCV: 95 fL (ref 79–97)
Platelets: 279 x10E3/uL (ref 150–450)
RBC: 4 x10E6/uL (ref 3.77–5.28)
RDW: 13 % (ref 11.7–15.4)
WBC: 8.3 x10E3/uL (ref 3.4–10.8)

## 2024-01-12 LAB — COMPREHENSIVE METABOLIC PANEL WITH GFR
ALT: 17 IU/L (ref 0–32)
AST: 29 IU/L (ref 0–40)
Albumin: 3.7 g/dL — ABNORMAL LOW (ref 3.8–4.8)
Alkaline Phosphatase: 110 IU/L (ref 49–135)
BUN/Creatinine Ratio: 21 (ref 12–28)
BUN: 15 mg/dL (ref 8–27)
Bilirubin Total: 1 mg/dL (ref 0.0–1.2)
CO2: 18 mmol/L — ABNORMAL LOW (ref 20–29)
Calcium: 9.7 mg/dL (ref 8.7–10.3)
Chloride: 110 mmol/L — ABNORMAL HIGH (ref 96–106)
Creatinine, Ser: 0.72 mg/dL (ref 0.57–1.00)
Globulin, Total: 2.2 g/dL (ref 1.5–4.5)
Glucose: 125 mg/dL — ABNORMAL HIGH (ref 70–99)
Potassium: 4.6 mmol/L (ref 3.5–5.2)
Sodium: 143 mmol/L (ref 134–144)
Total Protein: 5.9 g/dL — ABNORMAL LOW (ref 6.0–8.5)
eGFR: 85 mL/min/1.73

## 2024-01-12 LAB — HEMOGLOBIN A1C
Est. average glucose Bld gHb Est-mCnc: 126 mg/dL
Hgb A1c MFr Bld: 6 % — ABNORMAL HIGH (ref 4.8–5.6)

## 2024-01-12 LAB — BRAIN NATRIURETIC PEPTIDE: BNP: 100.9 pg/mL — ABNORMAL HIGH (ref 0.0–100.0)

## 2024-01-12 LAB — PROTIME-INR
INR: 1 (ref 0.9–1.2)
Prothrombin Time: 11.3 s (ref 9.1–12.0)

## 2024-01-13 NOTE — Telephone Encounter (Signed)
 I am okay to continue her care. Thanks. GM

## 2024-01-16 ENCOUNTER — Ambulatory Visit: Payer: Self-pay | Admitting: Internal Medicine

## 2024-01-20 ENCOUNTER — Other Ambulatory Visit: Payer: Self-pay | Admitting: Internal Medicine

## 2024-01-22 ENCOUNTER — Encounter: Payer: Self-pay | Admitting: Internal Medicine

## 2024-01-22 NOTE — Assessment & Plan Note (Signed)
 Blood pressure variable with occasional hypotension. Amlodipine  reduction considered due to hypotension and edema. Discussed potential switch to hydralazine. - Reduced amlodipine  to half a pill daily using a pill cutter. - Instructed to maintain a blood pressure log for hypertension clinic appointment. - Ordered BNP to assess heart function. - Continue valsartan  as prescribed. - Will discuss potential switch to hydralazine with hypertension clinic if amlodipine  is discontinued.

## 2024-01-22 NOTE — Assessment & Plan Note (Signed)
 Managed with azathioprine . Liver function monitoring ongoing. - Continue azathioprine  as per hepatologist's recommendations. - Ordered liver function tests, blood count, and INR as per hepatologist's orders.

## 2024-01-22 NOTE — Assessment & Plan Note (Signed)
 Cholesterol management discussed. Restart of medication approved by hepatologist.

## 2024-01-22 NOTE — Assessment & Plan Note (Signed)
 Chronic, she is encouraged to keep BP well controlled and to stay well hydrated to decrease risk of CKD progression.

## 2024-01-22 NOTE — Assessment & Plan Note (Signed)
 IPMN confirmed. No longer planning for surgery. - Continue surveillance.

## 2024-01-24 ENCOUNTER — Encounter: Payer: Self-pay | Admitting: Internal Medicine

## 2024-01-26 ENCOUNTER — Encounter (HOSPITAL_BASED_OUTPATIENT_CLINIC_OR_DEPARTMENT_OTHER): Payer: Self-pay

## 2024-01-30 ENCOUNTER — Institutional Professional Consult (permissible substitution) (HOSPITAL_BASED_OUTPATIENT_CLINIC_OR_DEPARTMENT_OTHER): Admitting: Family

## 2024-01-31 ENCOUNTER — Encounter: Payer: Self-pay | Admitting: Internal Medicine

## 2024-02-09 ENCOUNTER — Other Ambulatory Visit: Payer: Self-pay | Admitting: Internal Medicine

## 2024-02-09 DIAGNOSIS — I1310 Hypertensive heart and chronic kidney disease without heart failure, with stage 1 through stage 4 chronic kidney disease, or unspecified chronic kidney disease: Secondary | ICD-10-CM

## 2024-03-09 ENCOUNTER — Ambulatory Visit: Admitting: Gastroenterology

## 2024-03-15 ENCOUNTER — Institutional Professional Consult (permissible substitution) (HOSPITAL_BASED_OUTPATIENT_CLINIC_OR_DEPARTMENT_OTHER): Admitting: Family

## 2024-05-15 ENCOUNTER — Ambulatory Visit: Payer: Self-pay | Admitting: Internal Medicine

## 2024-10-31 ENCOUNTER — Ambulatory Visit: Payer: Self-pay | Admitting: Internal Medicine

## 2024-10-31 ENCOUNTER — Ambulatory Visit: Payer: Self-pay
